# Patient Record
Sex: Female | Born: 1935 | Race: Black or African American | Hispanic: No | Smoking: Never smoker
Health system: Southern US, Community
[De-identification: ages and names within clinical notes are randomized; demographics above are authoritative.]

## PROBLEM LIST (undated history)

## (undated) DIAGNOSIS — K2100 Gastro-esophageal reflux disease with esophagitis, without bleeding: Secondary | ICD-10-CM

## (undated) DIAGNOSIS — B3731 Acute candidiasis of vulva and vagina: Secondary | ICD-10-CM

## (undated) DIAGNOSIS — I10 Essential (primary) hypertension: Secondary | ICD-10-CM

## (undated) DIAGNOSIS — N3090 Cystitis, unspecified without hematuria: Secondary | ICD-10-CM

## (undated) DIAGNOSIS — F329 Major depressive disorder, single episode, unspecified: Secondary | ICD-10-CM

## (undated) DIAGNOSIS — J189 Pneumonia, unspecified organism: Secondary | ICD-10-CM

## (undated) DIAGNOSIS — L0100 Impetigo, unspecified: Secondary | ICD-10-CM

## (undated) DIAGNOSIS — R519 Headache, unspecified: Secondary | ICD-10-CM

## (undated) DIAGNOSIS — M2560 Stiffness of unspecified joint, not elsewhere classified: Secondary | ICD-10-CM

## (undated) DIAGNOSIS — F0280 Dementia in other diseases classified elsewhere without behavioral disturbance: Secondary | ICD-10-CM

## (undated) DIAGNOSIS — R079 Chest pain, unspecified: Secondary | ICD-10-CM

## (undated) DIAGNOSIS — F411 Generalized anxiety disorder: Secondary | ICD-10-CM

## (undated) DIAGNOSIS — M1990 Unspecified osteoarthritis, unspecified site: Secondary | ICD-10-CM

## (undated) DIAGNOSIS — R269 Unspecified abnormalities of gait and mobility: Secondary | ICD-10-CM

## (undated) DIAGNOSIS — E785 Hyperlipidemia, unspecified: Secondary | ICD-10-CM

## (undated) DIAGNOSIS — F32A Depression, unspecified: Secondary | ICD-10-CM

## (undated) DIAGNOSIS — G309 Alzheimer's disease, unspecified: Secondary | ICD-10-CM

## (undated) DIAGNOSIS — R634 Abnormal weight loss: Secondary | ICD-10-CM

## (undated) DIAGNOSIS — R011 Cardiac murmur, unspecified: Secondary | ICD-10-CM

## (undated) DIAGNOSIS — K5900 Constipation, unspecified: Secondary | ICD-10-CM

## (undated) DIAGNOSIS — F03918 Unspecified dementia, unspecified severity, with other behavioral disturbance: Secondary | ICD-10-CM

## (undated) DIAGNOSIS — R569 Unspecified convulsions: Secondary | ICD-10-CM

## (undated) DIAGNOSIS — F0391 Unspecified dementia with behavioral disturbance: Secondary | ICD-10-CM

## (undated) DIAGNOSIS — E870 Hyperosmolality and hypernatremia: Secondary | ICD-10-CM

## (undated) DIAGNOSIS — B373 Candidiasis of vulva and vagina: Secondary | ICD-10-CM

## (undated) DIAGNOSIS — R413 Other amnesia: Secondary | ICD-10-CM

## (undated) DIAGNOSIS — K529 Noninfective gastroenteritis and colitis, unspecified: Secondary | ICD-10-CM

## (undated) DIAGNOSIS — K769 Liver disease, unspecified: Secondary | ICD-10-CM

## (undated) DIAGNOSIS — R51 Headache: Secondary | ICD-10-CM

## (undated) DIAGNOSIS — F4489 Other dissociative and conversion disorders: Secondary | ICD-10-CM

## (undated) DIAGNOSIS — L821 Other seborrheic keratosis: Secondary | ICD-10-CM

## (undated) DIAGNOSIS — I701 Atherosclerosis of renal artery: Secondary | ICD-10-CM

## (undated) HISTORY — DX: Unspecified abnormalities of gait and mobility: R26.9

## (undated) HISTORY — DX: Liver disease, unspecified: K76.9

## (undated) HISTORY — DX: Pneumonia, unspecified organism: J18.9

## (undated) HISTORY — DX: Cystitis, unspecified without hematuria: N30.90

## (undated) HISTORY — DX: Essential (primary) hypertension: I10

## (undated) HISTORY — DX: Other dissociative and conversion disorders: F44.89

## (undated) HISTORY — DX: Gastro-esophageal reflux disease with esophagitis, without bleeding: K21.00

## (undated) HISTORY — DX: Atherosclerosis of renal artery: I70.1

## (undated) HISTORY — DX: Hypercalcemia: E83.52

## (undated) HISTORY — DX: Candidiasis of vulva and vagina: B37.3

## (undated) HISTORY — DX: Acute candidiasis of vulva and vagina: B37.31

## (undated) HISTORY — DX: Constipation, unspecified: K59.00

## (undated) HISTORY — DX: Stiffness of unspecified joint, not elsewhere classified: M25.60

## (undated) HISTORY — DX: Noninfective gastroenteritis and colitis, unspecified: K52.9

## (undated) HISTORY — DX: Gastro-esophageal reflux disease with esophagitis: K21.0

## (undated) HISTORY — DX: Hyperosmolality and hypernatremia: E87.0

## (undated) HISTORY — DX: Other amnesia: R41.3

## (undated) HISTORY — DX: Unspecified osteoarthritis, unspecified site: M19.90

## (undated) HISTORY — DX: Chest pain, unspecified: R07.9

## (undated) HISTORY — DX: Unspecified dementia, unspecified severity, with other behavioral disturbance: F03.918

## (undated) HISTORY — DX: Hyperlipidemia, unspecified: E78.5

## (undated) HISTORY — DX: Dementia in other diseases classified elsewhere, unspecified severity, without behavioral disturbance, psychotic disturbance, mood disturbance, and anxiety: F02.80

## (undated) HISTORY — DX: Alzheimer's disease, unspecified: G30.9

## (undated) HISTORY — DX: Cardiac murmur, unspecified: R01.1

## (undated) HISTORY — DX: Abnormal weight loss: R63.4

## (undated) HISTORY — DX: Generalized anxiety disorder: F41.1

## (undated) HISTORY — DX: Impetigo, unspecified: L01.00

## (undated) HISTORY — DX: Headache, unspecified: R51.9

## (undated) HISTORY — DX: Unspecified dementia with behavioral disturbance: F03.91

## (undated) HISTORY — DX: Depression, unspecified: F32.A

## (undated) HISTORY — DX: Major depressive disorder, single episode, unspecified: F32.9

## (undated) HISTORY — DX: Other seborrheic keratosis: L82.1

## (undated) HISTORY — DX: Headache: R51

---

## 2002-10-08 ENCOUNTER — Other Ambulatory Visit: Admission: RE | Admit: 2002-10-08 | Discharge: 2002-10-08 | Payer: Self-pay | Admitting: Obstetrics and Gynecology

## 2003-10-12 ENCOUNTER — Other Ambulatory Visit: Admission: RE | Admit: 2003-10-12 | Discharge: 2003-10-12 | Payer: Self-pay | Admitting: Obstetrics and Gynecology

## 2004-11-03 ENCOUNTER — Other Ambulatory Visit: Admission: RE | Admit: 2004-11-03 | Discharge: 2004-11-03 | Payer: Self-pay | Admitting: Obstetrics and Gynecology

## 2005-11-15 ENCOUNTER — Other Ambulatory Visit: Admission: RE | Admit: 2005-11-15 | Discharge: 2005-11-15 | Payer: Self-pay | Admitting: Obstetrics and Gynecology

## 2007-07-24 DIAGNOSIS — R413 Other amnesia: Secondary | ICD-10-CM

## 2007-07-24 HISTORY — DX: Other amnesia: R41.3

## 2007-10-24 LAB — HM COLONOSCOPY: HM COLON: NORMAL

## 2008-08-19 DIAGNOSIS — R079 Chest pain, unspecified: Secondary | ICD-10-CM

## 2008-08-19 HISTORY — DX: Chest pain, unspecified: R07.9

## 2008-11-17 LAB — HM MAMMOGRAPHY: HM MAMMO: NEGATIVE

## 2009-09-13 ENCOUNTER — Encounter: Admission: RE | Admit: 2009-09-13 | Discharge: 2009-10-19 | Payer: Self-pay | Admitting: Internal Medicine

## 2010-11-24 LAB — HM DEXA SCAN

## 2011-04-07 ENCOUNTER — Other Ambulatory Visit: Payer: Self-pay | Admitting: Internal Medicine

## 2011-04-07 DIAGNOSIS — I10 Essential (primary) hypertension: Secondary | ICD-10-CM

## 2011-04-10 ENCOUNTER — Other Ambulatory Visit: Payer: Self-pay | Admitting: Internal Medicine

## 2011-04-10 ENCOUNTER — Inpatient Hospital Stay: Admission: RE | Admit: 2011-04-10 | Payer: Self-pay | Source: Ambulatory Visit

## 2011-04-10 DIAGNOSIS — I10 Essential (primary) hypertension: Secondary | ICD-10-CM

## 2011-04-13 ENCOUNTER — Ambulatory Visit
Admission: RE | Admit: 2011-04-13 | Discharge: 2011-04-13 | Disposition: A | Discharge status: Home / Self Care | Payer: Medicare Other | Source: Ambulatory Visit | Attending: Internal Medicine | Admitting: Internal Medicine

## 2011-04-13 ENCOUNTER — Other Ambulatory Visit: Payer: Self-pay

## 2011-04-13 DIAGNOSIS — I10 Essential (primary) hypertension: Secondary | ICD-10-CM

## 2011-04-14 ENCOUNTER — Other Ambulatory Visit: Payer: Self-pay

## 2011-11-02 DIAGNOSIS — J189 Pneumonia, unspecified organism: Secondary | ICD-10-CM

## 2011-11-02 DIAGNOSIS — K529 Noninfective gastroenteritis and colitis, unspecified: Secondary | ICD-10-CM

## 2011-11-02 HISTORY — DX: Noninfective gastroenteritis and colitis, unspecified: K52.9

## 2011-11-02 HISTORY — DX: Pneumonia, unspecified organism: J18.9

## 2013-02-26 ENCOUNTER — Other Ambulatory Visit: Payer: Self-pay | Admitting: *Deleted

## 2013-02-26 ENCOUNTER — Other Ambulatory Visit: Payer: Medicare Other

## 2013-02-26 DIAGNOSIS — I10 Essential (primary) hypertension: Secondary | ICD-10-CM

## 2013-02-26 DIAGNOSIS — E785 Hyperlipidemia, unspecified: Secondary | ICD-10-CM

## 2013-02-26 DIAGNOSIS — E870 Hyperosmolality and hypernatremia: Secondary | ICD-10-CM

## 2013-02-27 LAB — BASIC METABOLIC PANEL
BUN/Creatinine Ratio: 15 (ref 11–26)
BUN: 10 mg/dL (ref 8–27)
CO2: 24 mmol/L (ref 19–28)
Calcium: 10 mg/dL (ref 8.6–10.2)
Chloride: 103 mmol/L (ref 97–108)
Creatinine, Ser: 0.67 mg/dL (ref 0.57–1.00)
GFR calc Af Amer: 100 mL/min/{1.73_m2} (ref >59)
GFR calc non Af Amer: 87 mL/min/{1.73_m2} (ref >59)
Glucose: 86 mg/dL (ref 65–99)
Potassium: 4.5 mmol/L (ref 3.5–5.2)
Sodium: 142 mmol/L (ref 134–144)

## 2013-02-27 LAB — CBC WITH DIFFERENTIAL/PLATELET
Basophils Absolute: 0.1 10*3/uL (ref 0.0–0.2)
Basos: 1 % (ref 0–3)
Eos: 0 % (ref 0–5)
Eosinophils Absolute: 0 10*3/uL (ref 0.0–0.4)
HCT: 39.3 % (ref 34.0–46.6)
Hemoglobin: 12.6 g/dL (ref 11.1–15.9)
Immature Grans (Abs): 0 10*3/uL (ref 0.0–0.1)
Immature Granulocytes: 0 % (ref 0–2)
Lymphocytes Absolute: 2.6 10*3/uL (ref 0.7–3.1)
Lymphs: 48 % — ABNORMAL HIGH (ref 14–46)
MCH: 32 pg (ref 26.6–33.0)
MCHC: 32.1 g/dL (ref 31.5–35.7)
MCV: 100 fL — ABNORMAL HIGH (ref 79–97)
Monocytes Absolute: 0.4 10*3/uL (ref 0.1–0.9)
Monocytes: 7 % (ref 4–12)
Neutrophils Absolute: 2.4 10*3/uL (ref 1.4–7.0)
Neutrophils Relative %: 44 % (ref 40–74)
RBC: 3.94 x10E6/uL (ref 3.77–5.28)
RDW: 13.9 % (ref 12.3–15.4)
WBC: 5.5 10*3/uL (ref 3.4–10.8)

## 2013-03-19 ENCOUNTER — Encounter: Payer: Self-pay | Admitting: *Deleted

## 2013-03-20 ENCOUNTER — Ambulatory Visit (INDEPENDENT_AMBULATORY_CARE_PROVIDER_SITE_OTHER): Payer: Medicare Other | Admitting: Internal Medicine

## 2013-03-20 ENCOUNTER — Encounter: Payer: Self-pay | Admitting: Internal Medicine

## 2013-03-20 VITALS — BP 140/62 | HR 66 | Temp 95.3°F | Resp 18 | Wt 153.0 lb

## 2013-03-20 DIAGNOSIS — I701 Atherosclerosis of renal artery: Secondary | ICD-10-CM

## 2013-03-20 DIAGNOSIS — F0391 Unspecified dementia with behavioral disturbance: Secondary | ICD-10-CM | POA: Insufficient documentation

## 2013-03-20 DIAGNOSIS — G309 Alzheimer's disease, unspecified: Secondary | ICD-10-CM

## 2013-03-20 DIAGNOSIS — F0280 Dementia in other diseases classified elsewhere without behavioral disturbance: Secondary | ICD-10-CM

## 2013-03-20 DIAGNOSIS — I10 Essential (primary) hypertension: Secondary | ICD-10-CM

## 2013-03-20 DIAGNOSIS — F03918 Unspecified dementia, unspecified severity, with other behavioral disturbance: Secondary | ICD-10-CM | POA: Insufficient documentation

## 2013-03-20 DIAGNOSIS — K5900 Constipation, unspecified: Secondary | ICD-10-CM

## 2013-03-20 DIAGNOSIS — K219 Gastro-esophageal reflux disease without esophagitis: Secondary | ICD-10-CM

## 2013-03-20 DIAGNOSIS — E785 Hyperlipidemia, unspecified: Secondary | ICD-10-CM

## 2013-03-20 NOTE — Progress Notes (Signed)
Patient ID: Sabrina Long, female   DOB: 1935-10-30, 77 y.o.   MRN: [id number]  No Known Allergies  Chief Complaint  Patient presents with  . Medical Managment of Chronic Issues    Complains of speech getting worse    HPI: Patient is a 77 y.o. AA female seen in the office today for f/u of her chronic medical conditions including Alzheimer's disease with behavioral disturbance, constipation, and GERD.  Says sometimes she hurts--daughter says she does not c/o pain.    No longer on centrum--takes a vitamin drink.  Is taking her fosamax for her osteoporosis.    Husband is going to have a nuclear stress test instead of an exercise stress test.  Has to be asked everything now--does not remember to do anything on her own.  Mood has been better lately.  Mostly just sits.  Hides her diapers at times.  Husband keeps excusing the help or doing everything that the help is meant to do.  He won't let her sleep in.    BP is down to normal.  No chest pain.    Review of Systems:  Review of Systems  Constitutional: Negative for malaise/fatigue.  HENT: Negative for hearing loss.   Eyes: Negative for blurred vision.  Respiratory: Negative for shortness of breath.   Cardiovascular: Negative for chest pain and palpitations.  Gastrointestinal: Negative for abdominal pain.  Genitourinary: Negative for frequency.  Musculoskeletal: Negative for back pain, joint pain and falls.  Neurological: Negative for dizziness and weakness.  Endo/Heme/Allergies: Does not bruise/bleed easily.  Psychiatric/Behavioral: Positive for memory loss. Negative for depression. The patient is nervous/anxious. The patient does not have insomnia.     Past Medical History  Diagnosis Date  . Unspecified constipation   . Anxiety state, unspecified   . Colitis, enteritis, and gastroenteritis of presumed infectious origin 11/02/2011  . Pneumonia, organism unspecified 11/02/2011  . Cystitis, unspecified   . Impetigo   . Renal  artery stenosis   . Dementia in conditions classified elsewhere with behavioral disturbance(294.11)   . Hypercalcemia   . Reflux esophagitis   . Chest pain, unspecified   . Abnormality of gait   . Hyperosmolality and/or hypernatremia   . Unspecified disorder of liver   . Candidiasis of vulva and vagina   . Alzheimer's disease   . Chest pain, unspecified 08/19/2008  . Dementia in conditions classified elsewhere without behavioral disturbance(294.10)   . Depressive disorder, not elsewhere classified   . Esophageal reflux   . Other and unspecified hyperlipidemia   . Loss of weight   . Unspecified persistent mental disorders due to conditions classified elsewhere   . Reactive confusion   . Unspecified essential hypertension   . Other seborrheic keratosis   . Osteoarthrosis, unspecified whether generalized or localized, unspecified site   . Stiffness of joint, not elsewhere classified, unspecified site   . Memory loss 07/24/2007  . Headache(784.0)   . Undiagnosed cardiac murmurs    Past Surgical History  Procedure Laterality Date  . Cesarean section     Social History:   reports that she has never smoked. She does not have any smokeless tobacco history on file. Her alcohol and drug histories are not on file.  No family history on file.  Medications: Patient's Medications  New Prescriptions   No medications on file  Previous Medications   ALPRAZOLAM (XANAX) 0.5 MG TABLET    Take 0.5 mg by mouth at bedtime as needed for sleep. Take one tablet 3 times a  day for anxiety   ASCORBIC ACID (VITAMIN C) 500 MG TABLET    Take 500 mg by mouth daily. Take one tablet once a day for vitamin c supplement   ASPIRIN 81 MG TABLET    Take 81 mg by mouth daily. Take one tablet once a day to help with heart   HYDRALAZINE (APRESOLINE) 50 MG TABLET    Take 50 mg by mouth 3 (three) times daily. Take one tablet four times a day for blood pressure   LOSARTAN (COZAAR) 25 MG TABLET    Take 25 mg by mouth  daily. Take one tablet once a day for blood pressure   MEMANTINE (NAMENDA) 10 MG TABLET    Take 10 mg by mouth 2 (two) times daily. Take one tablet twice a day for memory loss   MULTIPLE VITAMINS-MINERALS (CENTRUM SILVER PO)    Take by mouth. Take one tablet once a day   MULTIPLE VITAMINS-MINERALS (OCUVITE PO)    Take by mouth. Take one tablet once a day to help preserve vision   NITROGLYCERIN (NITROSTAT) 0.4 MG SL TABLET    Place 0.4 mg under the tongue every 5 (five) minutes as needed for chest pain. Take one tablet sublingual every 5 minutes not to exceed three tablets for chest pain   OMEPRAZOLE (PRILOSEC) 40 MG CAPSULE    Take 40 mg by mouth daily. Take one tablet once a day for acid reflux   ROSUVASTATIN (CRESTOR) 10 MG TABLET    Take 10 mg by mouth daily. Take one tablet once a day for cholesterol   SERTRALINE (ZOLOFT) 100 MG TABLET    Take 100 mg by mouth daily. Take one tablet once a day   VITAMIN E (VITAMIN E) 400 UNIT CAPSULE    Take 400 Units by mouth daily. Take one tablet once a day for vitamin e supplement  Modified Medications   No medications on file  Discontinued Medications   No medications on file    Physical Exam: Filed Vitals:   03/20/13 1022  BP: 140/62  Pulse: 66  Temp: 95.3 F (35.2 C)  TempSrc: Oral  Resp: 18  Weight: 153 lb (69.4 kg)  Physical Exam  Constitutional: No distress.  Frail AA female NAD, minimal communication  HENT:  Head: Normocephalic and atraumatic.  Neck: No JVD present.  Cardiovascular: Normal rate, regular rhythm, normal heart sounds and intact distal pulses.   Pulmonary/Chest: Effort normal and breath sounds normal.  Abdominal: Soft. Bowel sounds are normal. She exhibits no distension. There is no tenderness.  Musculoskeletal: Normal range of motion. She exhibits no edema and no tenderness.  Neurological: She is alert.  Oriented to person only  Skin: She is not diaphoretic.  Psychiatric: Thought content is delusional. Cognition and  memory are impaired. She expresses inappropriate judgment. She exhibits abnormal recent memory and abnormal remote memory.   Labs reviewed: Basic Metabolic Panel:  Recent Labs  16/10/96 1202  NA 142  K 4.5  CL 103  CO2 24  GLUCOSE 86  BUN 10  CREATININE 0.67  CALCIUM 10.0   CBC:  Recent Labs  02/26/13 1202  WBC 5.5  NEUTROABS 2.4  HGB 12.6  HCT 39.3  MCV 100*   Assessment/Plan Unspecified constipation Recently stable.    Renal artery stenosis BP reasonably well controlled lately.  Avoid ace and arb.  Husband gives meds.  Alzheimer's disease Declining progressively despite control of vascular risks.  She is dependent in ADLs except transfers and eating.  She  only speaks minimally and most of it does not make sense.  Her husband tends to refuse the help of home health services and aides, but has very high caregiver burden.  Fortunately, from a behavioral standpoint, she's been stable.  I encouraged adult day health care or senior center for both of them.  Esophageal reflux No related complaints today.  Abdominal concerns come and go.  Hyperlipidemia LDL goal < 100 Follow up lipids today.  Cont crestor.   Labs/tests ordered"  Cbc, bmp, flp

## 2013-03-20 NOTE — Patient Instructions (Addendum)
I recommend adult day health care for both you and your husband. I checked your cholesterol today.

## 2013-03-21 LAB — LIPID PANEL
Chol/HDL Ratio: 2.6 ratio units (ref 0.0–4.4)
Cholesterol, Total: 179 mg/dL (ref 100–199)
HDL: 70 mg/dL (ref >39)
LDL Calculated: 97 mg/dL (ref 0–99)
Triglycerides: 62 mg/dL (ref 0–149)
VLDL Cholesterol Cal: 12 mg/dL (ref 5–40)

## 2013-03-22 NOTE — Assessment & Plan Note (Signed)
BP reasonably well controlled lately.  Avoid ace and arb.  Husband gives meds.

## 2013-03-22 NOTE — Assessment & Plan Note (Signed)
No related complaints today.  Abdominal concerns come and go.

## 2013-03-22 NOTE — Assessment & Plan Note (Signed)
Follow up lipids today.  Cont crestor.

## 2013-03-22 NOTE — Assessment & Plan Note (Signed)
Declining progressively despite control of vascular risks.  She is dependent in ADLs except transfers and eating.  She only speaks minimally and most of it does not make sense.  Her husband tends to refuse the help of home health services and aides, but has very high caregiver burden.  Fortunately, from a behavioral standpoint, she's been stable.  I encouraged adult day health care or senior center for both of them.

## 2013-03-22 NOTE — Assessment & Plan Note (Signed)
Recently stable.

## 2013-03-26 ENCOUNTER — Other Ambulatory Visit: Payer: Self-pay | Admitting: Internal Medicine

## 2013-04-09 ENCOUNTER — Other Ambulatory Visit: Payer: Self-pay | Admitting: Internal Medicine

## 2013-05-01 ENCOUNTER — Emergency Department (HOSPITAL_COMMUNITY): Payer: Medicare Other

## 2013-05-01 ENCOUNTER — Inpatient Hospital Stay (HOSPITAL_COMMUNITY)
Admission: EM | Admit: 2013-05-01 | Discharge: 2013-05-05 | DRG: 871 | Disposition: A | Discharge status: Home Health | Payer: Medicare Other | Attending: Family Medicine | Admitting: Family Medicine

## 2013-05-01 ENCOUNTER — Encounter (HOSPITAL_COMMUNITY): Payer: Self-pay | Admitting: Cardiology

## 2013-05-01 DIAGNOSIS — E785 Hyperlipidemia, unspecified: Secondary | ICD-10-CM | POA: Diagnosis present

## 2013-05-01 DIAGNOSIS — I509 Heart failure, unspecified: Secondary | ICD-10-CM | POA: Diagnosis present

## 2013-05-01 DIAGNOSIS — F0280 Dementia in other diseases classified elsewhere without behavioral disturbance: Secondary | ICD-10-CM | POA: Diagnosis present

## 2013-05-01 DIAGNOSIS — M1990 Unspecified osteoarthritis, unspecified site: Secondary | ICD-10-CM | POA: Diagnosis present

## 2013-05-01 DIAGNOSIS — I498 Other specified cardiac arrhythmias: Secondary | ICD-10-CM | POA: Diagnosis present

## 2013-05-01 DIAGNOSIS — F0390 Unspecified dementia without behavioral disturbance: Secondary | ICD-10-CM | POA: Diagnosis present

## 2013-05-01 DIAGNOSIS — F411 Generalized anxiety disorder: Secondary | ICD-10-CM | POA: Diagnosis present

## 2013-05-01 DIAGNOSIS — N710 Acute inflammatory disease of uterus: Secondary | ICD-10-CM | POA: Diagnosis present

## 2013-05-01 DIAGNOSIS — F329 Major depressive disorder, single episode, unspecified: Secondary | ICD-10-CM | POA: Diagnosis present

## 2013-05-01 DIAGNOSIS — R4182 Altered mental status, unspecified: Secondary | ICD-10-CM

## 2013-05-01 DIAGNOSIS — Z22322 Carrier or suspected carrier of Methicillin resistant Staphylococcus aureus: Secondary | ICD-10-CM

## 2013-05-01 DIAGNOSIS — A481 Legionnaires' disease: Secondary | ICD-10-CM

## 2013-05-01 DIAGNOSIS — A419 Sepsis, unspecified organism: Principal | ICD-10-CM | POA: Diagnosis present

## 2013-05-01 DIAGNOSIS — J189 Pneumonia, unspecified organism: Secondary | ICD-10-CM

## 2013-05-01 DIAGNOSIS — F3289 Other specified depressive episodes: Secondary | ICD-10-CM | POA: Diagnosis present

## 2013-05-01 DIAGNOSIS — G309 Alzheimer's disease, unspecified: Secondary | ICD-10-CM | POA: Diagnosis present

## 2013-05-01 LAB — CBC WITH DIFFERENTIAL/PLATELET
Basophils Absolute: 0 10*3/uL (ref 0.0–0.1)
Eosinophils Absolute: 0 10*3/uL (ref 0.0–0.7)
Eosinophils Relative: 0 % (ref 0–5)
Lymphs Abs: 1.8 10*3/uL (ref 0.7–4.0)
MCH: 32.3 pg (ref 26.0–34.0)
MCV: 95.1 fL (ref 78.0–100.0)
Neutrophils Relative %: 84 % — ABNORMAL HIGH (ref 43–77)
Platelets: 192 10*3/uL (ref 150–400)
RBC: 3.71 MIL/uL — ABNORMAL LOW (ref 3.87–5.11)
RDW: 14.3 % (ref 11.5–15.5)
WBC: 20.7 10*3/uL — ABNORMAL HIGH (ref 4.0–10.5)

## 2013-05-01 LAB — URINALYSIS, ROUTINE W REFLEX MICROSCOPIC
Glucose, UA: 100 mg/dL — AB
Hgb urine dipstick: NEGATIVE
Specific Gravity, Urine: 1.026 (ref 1.005–1.030)
Urobilinogen, UA: 1 mg/dL (ref 0.0–1.0)
pH: 5.5 (ref 5.0–8.0)

## 2013-05-01 LAB — URINE MICROSCOPIC-ADD ON

## 2013-05-01 LAB — COMPREHENSIVE METABOLIC PANEL
ALT: 52 U/L — ABNORMAL HIGH (ref 0–35)
AST: 53 U/L — ABNORMAL HIGH (ref 0–37)
Albumin: 3.1 g/dL — ABNORMAL LOW (ref 3.5–5.2)
Alkaline Phosphatase: 80 U/L (ref 39–117)
Calcium: 9.7 mg/dL (ref 8.4–10.5)
Potassium: 3.4 mEq/L — ABNORMAL LOW (ref 3.5–5.1)
Sodium: 139 mEq/L (ref 135–145)
Total Protein: 8.1 g/dL (ref 6.0–8.3)

## 2013-05-01 LAB — POCT I-STAT 3, ART BLOOD GAS (G3+)
Acid-base deficit: 1 mmol/L (ref 0.0–2.0)
O2 Saturation: 89 %
pO2, Arterial: 53 mmHg — ABNORMAL LOW (ref 80.0–100.0)

## 2013-05-01 MED ORDER — ACETAMINOPHEN 650 MG RE SUPP
650.0000 mg | RECTAL | Status: DC | PRN
  Administered 2013-05-01: 650 mg via RECTAL
  Filled 2013-05-01: qty 1

## 2013-05-01 MED ORDER — HYDRALAZINE HCL 50 MG PO TABS
50.0000 mg | ORAL_TABLET | Freq: Three times a day (TID) | ORAL | Status: DC
  Administered 2013-05-02 – 2013-05-05 (×10): 50 mg via ORAL
  Filled 2013-05-01 (×14): qty 1

## 2013-05-01 MED ORDER — LORAZEPAM 2 MG/ML IJ SOLN
0.2500 mg | Freq: Once | INTRAMUSCULAR | Status: AC
  Administered 2013-05-01: 0.25 mg via INTRAVENOUS
  Filled 2013-05-01: qty 1

## 2013-05-01 MED ORDER — PANTOPRAZOLE SODIUM 40 MG PO TBEC
40.0000 mg | DELAYED_RELEASE_TABLET | Freq: Every day | ORAL | Status: DC
  Administered 2013-05-02 – 2013-05-05 (×4): 40 mg via ORAL
  Filled 2013-05-01 (×4): qty 1

## 2013-05-01 MED ORDER — CEFTRIAXONE SODIUM 1 G IJ SOLR
1.0000 g | INTRAMUSCULAR | Status: DC
  Filled 2013-05-01 (×2): qty 10

## 2013-05-01 MED ORDER — LORAZEPAM 2 MG/ML IJ SOLN
0.5000 mg | Freq: Once | INTRAMUSCULAR | Status: AC
  Administered 2013-05-01: 0.5 mg via INTRAVENOUS
  Filled 2013-05-01: qty 1

## 2013-05-01 MED ORDER — DEXTROSE 5 % IV SOLN
500.0000 mg | INTRAVENOUS | Status: DC
  Filled 2013-05-01 (×2): qty 500

## 2013-05-01 MED ORDER — LOSARTAN POTASSIUM 25 MG PO TABS
25.0000 mg | ORAL_TABLET | Freq: Every day | ORAL | Status: DC
  Administered 2013-05-02 – 2013-05-05 (×4): 25 mg via ORAL
  Filled 2013-05-01 (×4): qty 1

## 2013-05-01 MED ORDER — IOHEXOL 300 MG/ML  SOLN
50.0000 mL | Freq: Once | INTRAMUSCULAR | Status: AC | PRN
  Administered 2013-05-01: 50 mL via ORAL

## 2013-05-01 MED ORDER — HEPARIN SODIUM (PORCINE) 5000 UNIT/ML IJ SOLN
5000.0000 [IU] | Freq: Three times a day (TID) | INTRAMUSCULAR | Status: DC
  Administered 2013-05-02 – 2013-05-05 (×11): 5000 [IU] via SUBCUTANEOUS
  Filled 2013-05-01 (×13): qty 1

## 2013-05-01 MED ORDER — IOHEXOL 300 MG/ML  SOLN
100.0000 mL | Freq: Once | INTRAMUSCULAR | Status: AC | PRN
  Administered 2013-05-01: 100 mL via INTRAVENOUS

## 2013-05-01 MED ORDER — CEFTRIAXONE SODIUM 1 G IJ SOLR
1.0000 g | Freq: Once | INTRAMUSCULAR | Status: AC
  Administered 2013-05-01: 1 g via INTRAVENOUS
  Filled 2013-05-01: qty 10

## 2013-05-01 MED ORDER — ATORVASTATIN CALCIUM 10 MG PO TABS
10.0000 mg | ORAL_TABLET | Freq: Every day | ORAL | Status: DC
  Administered 2013-05-02 – 2013-05-05 (×4): 10 mg via ORAL
  Filled 2013-05-01 (×4): qty 1

## 2013-05-01 MED ORDER — ALPRAZOLAM 0.5 MG PO TABS: 0.5000 mg | ORAL_TABLET | Freq: Every evening | ORAL | Status: DC | PRN

## 2013-05-01 MED ORDER — METRONIDAZOLE IN NACL 5-0.79 MG/ML-% IV SOLN
500.0000 mg | Freq: Three times a day (TID) | INTRAVENOUS | Status: DC
  Administered 2013-05-02: 500 mg via INTRAVENOUS
  Filled 2013-05-01 (×4): qty 100

## 2013-05-01 MED ORDER — DEXTROSE 5 % IV SOLN
500.0000 mg | Freq: Once | INTRAVENOUS | Status: AC
  Administered 2013-05-01: 23:00:00 via INTRAVENOUS
  Filled 2013-05-01: qty 500

## 2013-05-01 MED ORDER — MEMANTINE HCL 10 MG PO TABS
10.0000 mg | ORAL_TABLET | Freq: Two times a day (BID) | ORAL | Status: DC
  Administered 2013-05-02 – 2013-05-05 (×7): 10 mg via ORAL
  Filled 2013-05-01 (×9): qty 1

## 2013-05-01 MED ORDER — SERTRALINE HCL 100 MG PO TABS
100.0000 mg | ORAL_TABLET | Freq: Every day | ORAL | Status: DC
  Administered 2013-05-02 – 2013-05-05 (×4): 100 mg via ORAL
  Filled 2013-05-01 (×4): qty 1

## 2013-05-01 NOTE — ED Provider Notes (Signed)
History    CSN: [id number] Arrival date & time 05/01/13  1637  First MD Initiated Contact with Patient 05/01/13 1657     Chief Complaint  Patient presents with  . Shortness of Breath  . Fever  level 5 caveat due to dementia (Consider location/radiation/quality/duration/timing/severity/associated sxs/prior Treatment) Patient is a 77 y.o. female presenting with shortness of breath and fever. The history is provided by the patient and a relative.  Shortness of Breath Associated symptoms: fever   Fever  patient has had some bending over possibly and pain. She was seen at an urgent care up in [place]  and was diagnosed with bilateral pneumonia. She reportedly had a pulse ox is 90%. family did not want her admitted up there and they came down here for admission. She did not come with x-ray or lab work. She is baseline demented. Past Medical History  Diagnosis Date  . Unspecified constipation   . Anxiety state, unspecified   . Colitis, enteritis, and gastroenteritis of presumed infectious origin 11/02/2011  . Pneumonia, organism unspecified 11/02/2011  . Cystitis, unspecified   . Impetigo   . Renal artery stenosis   . Dementia in conditions classified elsewhere with behavioral disturbance(294.11)   . Hypercalcemia   . Reflux esophagitis   . Chest pain, unspecified   . Abnormality of gait   . Hyperosmolality and/or hypernatremia   . Unspecified disorder of liver   . Candidiasis of vulva and vagina   . Alzheimer's disease   . Chest pain, unspecified 08/19/2008  . Dementia in conditions classified elsewhere without behavioral disturbance(294.10)   . Depressive disorder, not elsewhere classified   . Esophageal reflux   . Other and unspecified hyperlipidemia   . Loss of weight   . Unspecified persistent mental disorders due to conditions classified elsewhere   . Reactive confusion   . Unspecified essential hypertension   . Other seborrheic keratosis   . Osteoarthrosis,  unspecified whether generalized or localized, unspecified site   . Stiffness of joint, not elsewhere classified, unspecified site   . Memory loss 07/24/2007  . Headache(784.0)   . Undiagnosed cardiac murmurs    Past Surgical History  Procedure Laterality Date  . Cesarean section     History reviewed. No pertinent family history. History  Substance Use Topics  . Smoking status: Never Smoker   . Smokeless tobacco: Not on file  . Alcohol Use: Not on file   OB History   Grav Para Term Preterm Abortions TAB SAB Ect Mult Living                 Review of Systems  Unable to perform ROS: Dementia  Constitutional: Positive for fever.  Respiratory: Positive for shortness of breath.     Allergies  Review of patient's allergies indicates no known allergies.  Home Medications   Current Outpatient Rx  Name  Route  Sig  Dispense  Refill  . alendronate (FOSAMAX) 70 MG tablet   Oral   Take 70 mg by mouth every 7 (seven) days. Take with a full glass of water on an empty stomach.         . ALPRAZolam (XANAX) 0.5 MG tablet   Oral   Take 0.5 mg by mouth at bedtime as needed for sleep. Take one tablet 3 times a day for anxiety         . hydrALAZINE (APRESOLINE) 50 MG tablet   Oral   Take 50 mg by mouth 3 (three) times daily.          Marland Kitchen  losartan (COZAAR) 25 MG tablet   Oral   Take 25 mg by mouth daily. Take one tablet once a day for blood pressure         . memantine (NAMENDA) 10 MG tablet   Oral   Take 10 mg by mouth 2 (two) times daily. Take one tablet twice a day for memory loss         . Multiple Vitamins-Minerals (CENTRUM SILVER PO)   Oral   Take by mouth. Take one tablet once a day         . nitroGLYCERIN (NITROSTAT) 0.4 MG SL tablet   Sublingual   Place 0.4 mg under the tongue every 5 (five) minutes as needed for chest pain. Take one tablet sublingual every 5 minutes not to exceed three tablets for chest pain         . omeprazole (PRILOSEC) 20 MG capsule       TAKE 1 CAPSULE ONCE DAILY TO REDUCE STOMACH ACID AND TO HELP PROTECT ESOPHAGUS   84 capsule   2   . rosuvastatin (CRESTOR) 10 MG tablet               . sertraline (ZOLOFT) 100 MG tablet   Oral   Take 100 mg by mouth daily. Take one tablet once a day          BP 146/72  Pulse 111  Temp(Src) 101.5 F (38.6 C) (Oral)  Resp 31  SpO2 99% Physical Exam  Constitutional: She appears well-developed and well-nourished.  HENT:  Head: Normocephalic.  Eyes: Pupils are equal, round, and reactive to light.  Cardiovascular:  Mild tachycardia  Pulmonary/Chest: Effort normal.  Mildly harsh breath sounds mild tachypnea  Abdominal: There is tenderness.  Mild diffuse tenderness.  Musculoskeletal: She exhibits no edema.  Neurological: She is alert.  Patient has baseline dementia  Skin: Skin is warm.    ED Course  Procedures (including critical care time) Labs Reviewed  CBC WITH DIFFERENTIAL - Abnormal; Notable for the following:    WBC 20.7 (*)    RBC 3.71 (*)    HCT 35.3 (*)    Neutrophils Relative % 84 (*)    Neutro Abs 17.4 (*)    Lymphocytes Relative 9 (*)    Monocytes Absolute 1.5 (*)    All other components within normal limits  COMPREHENSIVE METABOLIC PANEL - Abnormal; Notable for the following:    Potassium 3.4 (*)    Glucose, Bld 134 (*)    Albumin 3.1 (*)    AST 53 (*)    ALT 52 (*)    GFR calc non Af Amer 62 (*)    GFR calc Af Amer 72 (*)    All other components within normal limits  URINALYSIS, ROUTINE W REFLEX MICROSCOPIC - Abnormal; Notable for the following:    Color, Urine AMBER (*)    APPearance HAZY (*)    Glucose, UA 100 (*)    Bilirubin Urine SMALL (*)    Ketones, ur 15 (*)    Protein, ur 100 (*)    Leukocytes, UA SMALL (*)    All other components within normal limits  URINE MICROSCOPIC-ADD ON - Abnormal; Notable for the following:    Bacteria, UA FEW (*)    Casts HYALINE CASTS (*)    All other components within normal limits  POCT I-STAT 3,  BLOOD GAS (G3+) - Abnormal; Notable for the following:    pCO2 arterial 34.8 (*)    pO2, Arterial 53.0 (*)  All other components within normal limits  CULTURE, BLOOD (ROUTINE X 2)  CULTURE, BLOOD (ROUTINE X 2)  CULTURE, EXPECTORATED SPUTUM-ASSESSMENT  GRAM STAIN  LIPASE, BLOOD  HIV ANTIBODY (ROUTINE TESTING)  LEGIONELLA ANTIGEN, URINE  STREP PNEUMONIAE URINARY ANTIGEN  CBC  BASIC METABOLIC PANEL  CG4 I-STAT (LACTIC ACID)   Dg Chest 2 View  05/01/2013   *RADIOLOGY REPORT*  Clinical Data: Shortness of breath.  CHEST - 2 VIEW  Comparison: None.  Findings: Mild cardiomegaly with vascular congestion.  Diffuse interstitial prominence may reflect interstitial edema.  Bibasilar atelectasis.  No effusions.  No acute bony abnormality.  IMPRESSION: Suspect mild edema/CHF.  Bibasilar atelectasis.   Original Report Authenticated By: Charlett Nose, M.D.   Ct Abdomen Pelvis W Contrast  05/01/2013   *RADIOLOGY REPORT*  Clinical Data: Shortness of breath, fever.  CT ABDOMEN AND PELVIS WITH CONTRAST  Technique:  Multidetector CT imaging of the abdomen and pelvis was performed following the standard protocol during bolus administration of intravenous contrast.  Contrast: OMNIPAQUE IOHEXOL 300 MG/ML  SOLN  Comparison: None.  Findings: Somewhat nodular appearing airspace opacities are noted in the visualized lower lung fields bilaterally.  I am concerned this likely represents pneumonia.  No effusions.  Heart is borderline in size.  Liver, gallbladder, spleen, pancreas, adrenals and right kidney are unremarkable.  Small cysts in the left kidney.  No hydronephrosis.  Moderate stool throughout the colon.  Small bowel is decompressed.  There is air centrally within the uterus, likely within the endometrial cavity.  If there has not been recent instrumentation, I cannot exclude infection/endometritis.  Calcified fibroids throughout the uterus.  No adnexal masses.  Urinary bladder is unremarkable.  Aorta is normal  caliber.  IMPRESSION: Patchy bilateral somewhat nodular appearing opacities in the lower lungs concerning for pneumonia.  Small amount of gas centrally within the uterus, presumably within the endometrium.  If the patient has not had a recent instrumentation, this could represent infection/endometritis. Recommend clinical correlation.   Original Report Authenticated By: Charlett Nose, M.D.   1. CAP (community acquired pneumonia)   2. Acute endometritis   3. Altered mental status   4. Sepsis     MDM  Patient with bilateral pneumonia found on CT. X-ray of did not show the pneumonia here, however the outpatient x-ray was reported to show it. White count is elevated. She has a fever. She initially was not in respiratory distress but her respiratory rate later increased. CT scan shows air within the uterus. No recent instrumentation. Discussed with GYN. Stated it may need to get followed. Will admit to internal medicine.  Juliet Rude. Rubin Payor, MD 05/02/13 0981

## 2013-05-01 NOTE — ED Notes (Signed)
Pt family reports that she has not been feeling well over the past couple of days. Family reports that pt had a fever today and was taken to [facility] in [place]. X-ray reports double PNA per family. Pt with hx of alzheimers. No distress noted. Pt denies any pain at this time.

## 2013-05-01 NOTE — Progress Notes (Signed)
Patient started on 4L [place] due to PO2 on ABG result.  RN aware.

## 2013-05-02 LAB — STREP PNEUMONIAE URINARY ANTIGEN: Strep Pneumo Urinary Antigen: NEGATIVE

## 2013-05-02 LAB — LEGIONELLA ANTIGEN, URINE: Legionella Antigen, Urine: POSITIVE

## 2013-05-02 LAB — CBC
Platelets: 188 10*3/uL (ref 150–400)
RDW: 14.6 % (ref 11.5–15.5)
WBC: 19.9 10*3/uL — ABNORMAL HIGH (ref 4.0–10.5)

## 2013-05-02 LAB — BASIC METABOLIC PANEL
Chloride: 103 mEq/L (ref 96–112)
GFR calc Af Amer: >90 mL/min (ref >90)
Potassium: 4.9 mEq/L (ref 3.5–5.1)
Sodium: 137 mEq/L (ref 135–145)

## 2013-05-02 LAB — MRSA PCR SCREENING: MRSA by PCR: POSITIVE — AB

## 2013-05-02 MED ORDER — ACETAMINOPHEN 500 MG PO TABS
500.0000 mg | ORAL_TABLET | Freq: Four times a day (QID) | ORAL | Status: DC | PRN
  Administered 2013-05-02 – 2013-05-03 (×3): 500 mg via ORAL
  Filled 2013-05-02 (×3): qty 1

## 2013-05-02 MED ORDER — ENSURE COMPLETE PO LIQD
237.0000 mL | Freq: Two times a day (BID) | ORAL | Status: DC
  Administered 2013-05-02 – 2013-05-05 (×7): 237 mL via ORAL

## 2013-05-02 MED ORDER — CHLORHEXIDINE GLUCONATE CLOTH 2 % EX PADS
6.0000 | MEDICATED_PAD | Freq: Every day | CUTANEOUS | Status: DC
  Administered 2013-05-02 – 2013-05-05 (×4): 6 via TOPICAL

## 2013-05-02 MED ORDER — SODIUM CHLORIDE 0.9 % IV SOLN
INTRAVENOUS | Status: DC
  Administered 2013-05-02 – 2013-05-04 (×5): via INTRAVENOUS

## 2013-05-02 MED ORDER — LEVOFLOXACIN IN D5W 750 MG/150ML IV SOLN
750.0000 mg | INTRAVENOUS | Status: DC
  Administered 2013-05-02 – 2013-05-04 (×3): 750 mg via INTRAVENOUS
  Filled 2013-05-02 (×4): qty 150

## 2013-05-02 MED ORDER — ALPRAZOLAM 0.5 MG PO TABS
0.5000 mg | ORAL_TABLET | Freq: Three times a day (TID) | ORAL | Status: DC | PRN
  Administered 2013-05-02 – 2013-05-04 (×5): 0.5 mg via ORAL
  Filled 2013-05-02 (×5): qty 1

## 2013-05-02 MED ORDER — METOPROLOL TARTRATE 12.5 MG HALF TABLET
12.5000 mg | ORAL_TABLET | Freq: Two times a day (BID) | ORAL | Status: DC
  Administered 2013-05-02 – 2013-05-05 (×7): 12.5 mg via ORAL
  Filled 2013-05-02 (×8): qty 1

## 2013-05-02 MED ORDER — MUPIROCIN 2 % EX OINT
1.0000 "application " | TOPICAL_OINTMENT | Freq: Two times a day (BID) | CUTANEOUS | Status: DC
  Administered 2013-05-02 – 2013-05-05 (×7): 1 via NASAL
  Filled 2013-05-02 (×3): qty 22

## 2013-05-02 NOTE — Progress Notes (Signed)
Utilization Review Completed.  05/02/2013

## 2013-05-02 NOTE — Progress Notes (Addendum)
INITIAL NUTRITION ASSESSMENT  DOCUMENTATION CODES Per approved criteria  -Not Applicable   INTERVENTION:  Ensure Complete twice daily (350 kcals, 13 gm protein per 8 fl oz bottle) RD to follow for nutrition care plan  NUTRITION DIAGNOSIS: Increased nutrient needs related to acute illness, skin integrity as evidenced by estimated nutrition needs  Goal: Oral intake with meals & supplements to meet >/= 90% of estimated nutrition needs  Monitor:  PO & supplemental intake, weight, labs, I/O's  Reason for Assessment: Low Braden  77 y.o. female  Admitting Dx: Sepsis  ASSESSMENT: Patient brought in by family for illness over the past couple of days; patient had been febrile and was hunching over grabbing her abdomen at times, so family took her to [facility] in [place]; CXR showed PNA; transferred to [facility] for further evaluation ---> found to have B PNA and possible endometritis.  RD unable to obtain nutrition hx from patient given AMS; no family at bedside; no % PO intake recorded in flowsheet records; height estimated per RD as not available in EMR; patient at risk for skin breakdown given low braden score; would benefit from nutrition supplements ---> RD to order.  Height: ~ 5 '5 (165 cm)  Weight: Wt Readings from Last 1 Encounters:  03/20/13 153 lb (69.4 kg)    Ideal Body Weight: 125 lb  % Ideal Body Weight: 122%  Wt Readings from Last 10 Encounters:  03/20/13 153 lb (69.4 kg)    Usual Body Weight: unable to obtain  % Usual Body Weight: ---  BMI:  25.3 kg/m2  Estimated Nutritional Needs: Kcal: 1700-1900 Protein: 80-90 gm Fluid: 1.7-1.9 L  Skin: Intact  Diet Order: General  EDUCATION NEEDS: -No education needs identified at this time   Intake/Output Summary (Last 24 hours) at 05/02/13 1419 Last data filed at 05/02/13 0600  Gross per 24 hour  Intake      0 ml  Output    250 ml  Net   -250 ml    Labs:   Recent Labs Lab 05/01/13 1827 05/02/13 1045   NA 139 137  K 3.4* 4.9  CL 105 103  CO2 24 23  BUN 14 11  CREATININE 0.89 0.74  CALCIUM 9.7 9.8  GLUCOSE 134* 155*    Scheduled Meds: . atorvastatin  10 mg Oral q1800  . Chlorhexidine Gluconate Cloth  6 each Topical Q0600  . heparin  5,000 Units Subcutaneous Q8H  . hydrALAZINE  50 mg Oral Q8H  . levofloxacin (LEVAQUIN) IV  750 mg Intravenous Q24H  . losartan  25 mg Oral Daily  . memantine  10 mg Oral BID  . metoprolol tartrate  12.5 mg Oral BID  . mupirocin ointment  1 application Nasal BID  . pantoprazole  40 mg Oral Daily  . sertraline  100 mg Oral Daily    Continuous Infusions: . sodium chloride 50 mL/hr at 05/02/13 1036    Past Medical History  Diagnosis Date  . Unspecified constipation   . Anxiety state, unspecified   . Colitis, enteritis, and gastroenteritis of presumed infectious origin 11/02/2011  . Pneumonia, organism unspecified 11/02/2011  . Cystitis, unspecified   . Impetigo   . Renal artery stenosis   . Dementia in conditions classified elsewhere with behavioral disturbance(294.11)   . Hypercalcemia   . Reflux esophagitis   . Chest pain, unspecified   . Abnormality of gait   . Hyperosmolality and/or hypernatremia   . Unspecified disorder of liver   . Candidiasis of vulva and  vagina   . Alzheimer's disease   . Chest pain, unspecified 08/19/2008  . Dementia in conditions classified elsewhere without behavioral disturbance(294.10)   . Depressive disorder, not elsewhere classified   . Esophageal reflux   . Other and unspecified hyperlipidemia   . Loss of weight   . Unspecified persistent mental disorders due to conditions classified elsewhere   . Reactive confusion   . Unspecified essential hypertension   . Other seborrheic keratosis   . Osteoarthrosis, unspecified whether generalized or localized, unspecified site   . Stiffness of joint, not elsewhere classified, unspecified site   . Memory loss 07/24/2007  . Headache(784.0)   . Undiagnosed  cardiac murmurs     Past Surgical History  Procedure Laterality Date  . Cesarean section      Sabrina Long, RD, LDN Pager #: [phone] After-Hours Pager #: [phone]

## 2013-05-02 NOTE — Progress Notes (Signed)
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 Mahala Menghini  Time of first page:  1350  MD notified (2nd page):  Time of second page:  Responding MD:  Dr. Mahala Menghini  Time MD responded:  [phone]

## 2013-05-02 NOTE — H&P (Signed)
[facility] History and Physical  Sabrina Long:[id number] DOB: December 02, 1935 DOA: 05/01/2013  Referring physician: ED PCP: Bufford Spikes, DO   Chief Complaint: PNA, abd pain  HPI: Sabrina Long is a 77 y.o. female who is brought in by family for illness over the past couple of days.  She has apparently been febrile and hunching over grabbing her abdomen at times so family took her to [facility] in [place].  CXR showed PNA.  [facility] was brought [facility] for [facility].  In [place] patient was found to have B PNA and possible endometritis on CT ABD/Pelvis, she was frankly septic with 4 out of 4 SIRS criteria at the time of my evaluation.  Patient is unfortunately demented with alzheimers and cant provide much history especially after receiving ativan in ED for agitation (now she is obtunded).  ABG did show slight hypoxia so she was started on [place] but does not appear to be in respiratory failure at this time so she was not intubated.  IM is admitting her to the SDU for further care and close follow up.  Review of Systems: Cannot be completed due to patient being lethargic in ED.  Past Medical History  Diagnosis Date  . Unspecified constipation   . Anxiety state, unspecified   . Colitis, enteritis, and gastroenteritis of presumed infectious origin 11/02/2011  . Pneumonia, organism unspecified 11/02/2011  . Cystitis, unspecified   . Impetigo   . Renal artery stenosis   . Dementia in conditions classified elsewhere with behavioral disturbance(294.11)   . Hypercalcemia   . Reflux esophagitis   . Chest pain, unspecified   . Abnormality of gait   . Hyperosmolality and/or hypernatremia   . Unspecified disorder of liver   . Candidiasis of vulva and vagina   . Alzheimer's disease   . Chest pain, unspecified 08/19/2008  . Dementia in conditions classified elsewhere without behavioral disturbance(294.10)   . Depressive disorder, not elsewhere classified   . Esophageal  reflux   . Other and unspecified hyperlipidemia   . Loss of weight   . Unspecified persistent mental disorders due to conditions classified elsewhere   . Reactive confusion   . Unspecified essential hypertension   . Other seborrheic keratosis   . Osteoarthrosis, unspecified whether generalized or localized, unspecified site   . Stiffness of joint, not elsewhere classified, unspecified site   . Memory loss 07/24/2007  . Headache(784.0)   . Undiagnosed cardiac murmurs    Past Surgical History  Procedure Laterality Date  . Cesarean section     Social History:  reports that she has never smoked. She does not have any smokeless tobacco history on file. Her alcohol and drug histories are not on file.   No Known Allergies  History reviewed. No pertinent family history.   Prior to Admission medications   Medication Sig Start Date End Date Taking? Authorizing Provider  alendronate (FOSAMAX) 70 MG tablet Take 70 mg by mouth every 7 (seven) days. Take with a full glass of water on an empty stomach.   Yes Historical Provider, MD  ALPRAZolam Prudy Feeler) 0.5 MG tablet Take 0.5 mg by mouth at bedtime as needed for sleep. Take one tablet 3 times a day for anxiety   Yes Historical Provider, MD  hydrALAZINE (APRESOLINE) 50 MG tablet Take 50 mg by mouth 3 (three) times daily.    Yes Historical Provider, MD  losartan (COZAAR) 25 MG tablet Take 25 mg by mouth daily. Take one tablet once a day  for blood pressure   Yes Historical Provider, MD  memantine (NAMENDA) 10 MG tablet Take 10 mg by mouth 2 (two) times daily. Take one tablet twice a day for memory loss   Yes Historical Provider, MD  Multiple Vitamins-Minerals (CENTRUM SILVER PO) Take by mouth. Take one tablet once a day   Yes Historical Provider, MD  nitroGLYCERIN (NITROSTAT) 0.4 MG SL tablet Place 0.4 mg under the tongue every 5 (five) minutes as needed for chest pain. Take one tablet sublingual every 5 minutes not to exceed three tablets for chest pain    Yes Historical Provider, MD  omeprazole (PRILOSEC) 20 MG capsule TAKE 1 CAPSULE ONCE DAILY TO REDUCE STOMACH ACID AND TO HELP PROTECT ESOPHAGUS 03/26/13  Yes Mahima Pandey, MD  rosuvastatin (CRESTOR) 10 MG tablet  04/09/13  Yes Mahima Pandey, MD  sertraline (ZOLOFT) 100 MG tablet Take 100 mg by mouth daily. Take one tablet once a day   Yes Historical Provider, MD   Physical Exam: Filed Vitals:   05/01/13 2230 05/01/13 2255 05/01/13 2300 05/01/13 2300  BP: 147/86  146/72   Pulse: 115  111   Temp:    101.5 F (38.6 C)  TempSrc:    Oral  Resp: 25  31   SpO2: 95% 96% 99%     General:  Lethargic, obtunded Eyes: PEERLA ENT: mucous membranes moist Neck: supple w/o JVD Cardiovascular: tachycardic, w/o MRG Respiratory: coarse BS bilaterally, accessory muscle use noted, tachypnea noted Abdomen: soft, nt, nd, bs+ Skin: no rash nor lesion Musculoskeletal: MAE Psychiatric: AMS Neurologic: unable to assess fully due to patient mental status  Labs on Admission:  Basic Metabolic Panel:  Recent Labs Lab 05/01/13 1827  NA 139  K 3.4*  CL 105  CO2 24  GLUCOSE 134*  BUN 14  CREATININE 0.89  CALCIUM 9.7   Liver Function Tests:  Recent Labs Lab 05/01/13 1827  AST 53*  ALT 52*  ALKPHOS 80  BILITOT 0.5  PROT 8.1  ALBUMIN 3.1*    Recent Labs Lab 05/01/13 1827  LIPASE 19   No results found for this basename: AMMONIA,  in the last 168 hours CBC:  Recent Labs Lab 05/01/13 1827  WBC 20.7*  NEUTROABS 17.4*  HGB 12.0  HCT 35.3*  MCV 95.1  PLT 192   Cardiac Enzymes: No results found for this basename: CKTOTAL, CKMB, CKMBINDEX, TROPONINI,  in the last 168 hours  BNP (last 3 results) No results found for this basename: PROBNP,  in the last 8760 hours CBG: No results found for this basename: GLUCAP,  in the last 168 hours  Radiological Exams on Admission: Dg Chest 2 View  05/01/2013   *RADIOLOGY REPORT*  Clinical Data: Shortness of breath.  CHEST - 2 VIEW  Comparison:  None.  Findings: Mild cardiomegaly with vascular congestion.  Diffuse interstitial prominence may reflect interstitial edema.  Bibasilar atelectasis.  No effusions.  No acute bony abnormality.  IMPRESSION: Suspect mild edema/CHF.  Bibasilar atelectasis.   Original Report Authenticated By: Charlett Nose, M.D.   Ct Abdomen Pelvis W Contrast  05/01/2013   *RADIOLOGY REPORT*  Clinical Data: Shortness of breath, fever.  CT ABDOMEN AND PELVIS WITH CONTRAST  Technique:  Multidetector CT imaging of the abdomen and pelvis was performed following the standard protocol during bolus administration of intravenous contrast.  Contrast: OMNIPAQUE IOHEXOL 300 MG/ML  SOLN  Comparison: None.  Findings: Somewhat nodular appearing airspace opacities are noted in the visualized lower lung fields bilaterally.  I  am concerned this likely represents pneumonia.  No effusions.  Heart is borderline in size.  Liver, gallbladder, spleen, pancreas, adrenals and right kidney are unremarkable.  Small cysts in the left kidney.  No hydronephrosis.  Moderate stool throughout the colon.  Small bowel is decompressed.  There is air centrally within the uterus, likely within the endometrial cavity.  If there has not been recent instrumentation, I cannot exclude infection/endometritis.  Calcified fibroids throughout the uterus.  No adnexal masses.  Urinary bladder is unremarkable.  Aorta is normal caliber.  IMPRESSION: Patchy bilateral somewhat nodular appearing opacities in the lower lungs concerning for pneumonia.  Small amount of gas centrally within the uterus, presumably within the endometrium.  If the patient has not had a recent instrumentation, this could represent infection/endometritis. Recommend clinical correlation.   Original Report Authenticated By: Charlett Nose, M.D.    EKG: Independently reviewed.  Assessment/Plan Principal Problem:   Sepsis Active Problems:   CAP (community acquired pneumonia)   Acute endometritis    Altered mental status   Dementia   1. Sepsis due to CAP and possibly acute endometritis - broad spectrum ABx therapy with rocephin, azithromycin, and adding flagyl for anaerobes with possible endometritis.  Continuous pulse ox, tele monitor, and close monitoring for deterioration in condition in the SDU, have very low threshold at this point for proceeding with endotracheal intubation but ABGs obtained after she was noted to be very tachypnic do not yet indicate the need for this. 2. H/o HTN - continue home meds, hold these if BP starts to decline or she is unable to take PO (which seems likely, most likely will end up needing to be switched to PRN meds in AM if her mental status dosent improve) 3. AMS - delirium on top of chronic dementia, would recommend halidol as primary sedation and benzos only when absolutely needed.   77 yo F in serious condition at this point, as I communicated with family may deteriorate without warning at any time during hospital stay.  Very low threshold for intubation, CCM consult, and transfer to ICU.  Code Status: Full Code per daughter, does want intubation if needed (must indicate code status--if unknown or must be presumed, indicate so) Family Communication: Spoke with daughter at bedside (indicate person spoken with, if applicable, with phone number if by telephone) Disposition Plan: Admit to inpatient stepdown (indicate anticipated LOS)  Time spent: 70 min  Sherill Wegener M. [facility] Pager [phone]  If 7PM-7AM, please contact night-coverage [email or web address] Password [facility] 05/02/2013, 12:06 AM

## 2013-05-02 NOTE — Progress Notes (Signed)
ANTIBIOTIC CONSULT NOTE - INITIAL  Pharmacy Consult for Levaquin Indication: pneumonia  No Known Allergies  Vital Signs: Temp: 102.6 Long (39.2 C) (07/11 1218) Temp src: Axillary (07/11 1218) BP: 111/58 mmHg (07/11 1300) Pulse Rate: 112 (07/11 1300) Intake/Output from previous day: 07/10 0701 - 07/11 0700 In: -  Out: 250 [Urine:250] Intake/Output from this shift:    Labs:  Recent Labs  05/01/13 1827 05/02/13 1045  WBC 20.7* 19.9*  HGB 12.0 12.8  PLT 192 188  CREATININE 0.89 0.Sabrina   CrCl is unknown because there is no height on file for the current visit. No results found for this basename: VANCOTROUGH, Leodis Binet, VANCORANDOM, GENTTROUGH, GENTPEAK, GENTRANDOM, TOBRATROUGH, TOBRAPEAK, TOBRARND, AMIKACINPEAK, AMIKACINTROU, AMIKACIN,  in the last 72 hours   Microbiology: Recent Results (from the past 720 hour(s))  MRSA PCR SCREENING     Status: Abnormal   Collection Time    05/02/13 12:50 AM      Result Value Range Status   MRSA by PCR POSITIVE (*) NEGATIVE Final   Comment:            The GeneXpert MRSA Assay (FDA     approved for NASAL specimens     only), is one component of a     comprehensive MRSA colonization     surveillance program. It is not     intended to diagnose MRSA     infection nor to guide or     monitor treatment for     MRSA infections.     RESULT CALLED TO, READ BACK BY AND VERIFIED WITH:     CALLED TO RN Nolon Nations [id number] @0427  THANEY    Medical History: Past Medical History  Diagnosis Date  . Unspecified constipation   . Anxiety state, unspecified   . Colitis, enteritis, and gastroenteritis of presumed infectious origin 11/02/2011  . Pneumonia, organism unspecified 11/02/2011  . Cystitis, unspecified   . Impetigo   . Renal artery stenosis   . Dementia in conditions classified elsewhere with behavioral disturbance(294.11)   . Hypercalcemia   . Reflux esophagitis   . Chest pain, unspecified   . Abnormality of gait   . Hyperosmolality  and/or hypernatremia   . Unspecified disorder of liver   . Candidiasis of vulva and vagina   . Alzheimer's disease   . Chest pain, unspecified 08/19/2008  . Dementia in conditions classified elsewhere without behavioral disturbance(294.10)   . Depressive disorder, not elsewhere classified   . Esophageal reflux   . Other and unspecified hyperlipidemia   . Loss of weight   . Unspecified persistent mental disorders due to conditions classified elsewhere   . Reactive confusion   . Unspecified essential hypertension   . Other seborrheic keratosis   . Osteoarthrosis, unspecified whether generalized or localized, unspecified site   . Stiffness of joint, not elsewhere classified, unspecified site   . Memory loss 07/24/2007  . Headache(784.0)   . Undiagnosed cardiac murmurs     Medications:  Scheduled:  . atorvastatin  10 mg Oral q1800  . Chlorhexidine Gluconate Cloth  6 each Topical Q0600  . heparin  5,000 Units Subcutaneous Q8H  . hydrALAZINE  50 mg Oral Q8H  . losartan  25 mg Oral Daily  . memantine  10 mg Oral BID  . metoprolol tartrate  12.5 mg Oral BID  . mupirocin ointment  1 application Nasal BID  . pantoprazole  40 mg Oral Daily  . sertraline  100 mg Oral Daily   Assessment: 77  y/o Long who was sent from [facility] with PNA. WBC 19.9<20.7, Scr 0.Sabrina, with CrCl ~60. Tmax 102.6.   7/11 Levaquin >> 7/10 CTX>>7/11 7/10 Azithromycin >>7/11  7/11 Blood x 2>> 7/11 Urine>>  Goal of Therapy:  Eradication of infection  Plan:  -Start Levaquin 750mg  IV q24h -Trend WBC, temp, renal function -Long/U micro data -Change to PO soon  Thank you for allowing me to take part in this patient's care,  Abran Duke, PharmD Clinical Pharmacist Phone: [phone] Pager: [phone] 05/02/2013 1:57 PM

## 2013-05-02 NOTE — Progress Notes (Signed)
PROGRESS NOTE  Sabrina Long EAV:[id number] DOB: [birth year] DOA: 05/01/2013 PCP: Bufford Spikes, DO  Brief narrative: 77 year old female with end stage dementia, renal artery stenosis, hyperlipidemia, gastro-esophageal reflux admitted 05/01/2013 with potential hospital-acquired pneumonia and? Endometritis  Past medical history-As per Problem list Chart reviewed as below- No reports available  Consultants:  None currently  Procedures:  Chest x-ray two-view 05/01/2013 = mild edema/CHF, bibasilar atelectasis  CT abdomen 05/01/2013% patchy bilateral nodular opacities lower lung concern for pneumonia, small amount of gas and fluid in the uterus,? Endometrial  Antibiotics:  Azithromycin 05/01/2013  Ceftriaxone 05/01/13  Flagyl 05/01/2013   Subjective  Pleasantly demented.  Eating at bedside without any specific distress.  Per daughter seems at her baseline.  Can sey hello and talk, but mostly it is nonsensical.  She has no orientation Daughter states has not had any of her regular medicines over the past day or so   Objective    Interim History: Chart reviewed  Telemetry:  Sinus tachycardia  Objective: Filed Vitals:   05/02/13 0500 05/02/13 0600 05/02/13 0700 05/02/13 0803  BP: 132/73 149/88 147/97 129/68  Pulse: 105 109 110 106  Temp:    101.9 F (38.8 C)  TempSrc:    Oral  Resp: 26 24 31 28   SpO2: 99% 99% 100% 100%    Intake/Output Summary (Last 24 hours) at 05/02/13 0842 Last data filed at 05/02/13 0600  Gross per 24 hour  Intake      0 ml  Output    250 ml  Net   -250 ml    Exam:  General: awake, eating.  Not oriented Cardiovascular: s1 s2 no m/r/g Respiratory: clear Abdomen: soft, NT/ND, No rebound or gaurding-noted soft formed stool in bed Skin Intact Neuro grossly moves all 4 limbs  Data Reviewed: Basic Metabolic Panel:  Recent Labs Lab 05/01/13 1827  NA 139  K 3.4*  CL 105  CO2 24  GLUCOSE 134*  BUN 14  CREATININE 0.89  CALCIUM  9.7   Liver Function Tests:  Recent Labs Lab 05/01/13 1827  AST 53*  ALT 52*  ALKPHOS 80  BILITOT 0.5  PROT 8.1  ALBUMIN 3.1*    Recent Labs Lab 05/01/13 1827  LIPASE 19   No results found for this basename: AMMONIA,  in the last 168 hours CBC:  Recent Labs Lab 05/01/13 1827  WBC 20.7*  NEUTROABS 17.4*  HGB 12.0  HCT 35.3*  MCV 95.1  PLT 192   Cardiac Enzymes: No results found for this basename: CKTOTAL, CKMB, CKMBINDEX, TROPONINI,  in the last 168 hours BNP: No components found with this basename: POCBNP,  CBG: No results found for this basename: GLUCAP,  in the last 168 hours  Recent Results (from the past 240 hour(s))  MRSA PCR SCREENING     Status: Abnormal   Collection Time    05/02/13 12:50 AM      Result Value Range Status   MRSA by PCR POSITIVE (*) NEGATIVE Final   Comment:            The GeneXpert MRSA Assay (FDA     approved for NASAL specimens     only), is one component of a     comprehensive MRSA colonization     surveillance program. It is not     intended to diagnose MRSA     infection nor to guide or     monitor treatment for     MRSA infections.     RESULT  CALLED TO, READ BACK BY AND VERIFIED WITH:     CALLED TO RN Nolon Nations [id number] @0427  THANEY     Studies:              All Imaging reviewed and is as per above notation   Scheduled Meds: . atorvastatin  10 mg Oral q1800  . azithromycin  500 mg Intravenous Q24H  . cefTRIAXone (ROCEPHIN)  IV  1 g Intravenous Q24H  . Chlorhexidine Gluconate Cloth  6 each Topical Q0600  . heparin  5,000 Units Subcutaneous Q8H  . hydrALAZINE  50 mg Oral Q8H  . losartan  25 mg Oral Daily  . memantine  10 mg Oral BID  . metronidazole  500 mg Intravenous Q8H  . mupirocin ointment  1 application Nasal BID  . pantoprazole  40 mg Oral Daily  . sertraline  100 mg Oral Daily   Continuous Infusions:    Assessment/Plan: 1. SIRS-moderate-Likely CAP, ? Aspiration-continue Azithro/Rocephin.  Obtain WBC in  am.  consider Rpt CXR.  NO overt speech deficit at bedside and seems to swallow well without cough.  UC ordered this am 2. ?Endometritis-very unlikely.  Not having any sexual contact currently and lives at home with husband.  D/c flagyl 3. ?CHF-Careful with volume-would just keeop at 50 cc/hour for now to balance fluid status-get BNP to rule out.  Discussion with family held and would want this treated but after exploring options are okay with holding off on Echo 4. Sinus Tachycardia-has not received her regular medicines.  Will re-implement.  Could be 2/2 to #1.  Could also be anxiety.  Monitor 5. Anxiety-continue Xanax 0.5 q pm prn 6. End-stage dementia without behavioral disturbances-continue Namenda 10 bid, Sertraline 100 daily 7. HLd-? Mortality benefit Lipitor 20 daily  Code Status: FUll Family Communication: Discussed with Daughters including HCPOA at bedside Disposition Plan:  inpatient   Pleas Koch, MD  [facility] Pager [phone] 05/02/2013, 8:42 AM    LOS: 1 day

## 2013-05-03 ENCOUNTER — Inpatient Hospital Stay (HOSPITAL_COMMUNITY): Payer: Medicare Other

## 2013-05-03 LAB — COMPREHENSIVE METABOLIC PANEL
BUN: 11 mg/dL (ref 6–23)
CO2: 23 mEq/L (ref 19–32)
Chloride: 109 mEq/L (ref 96–112)
Creatinine, Ser: 0.68 mg/dL (ref 0.50–1.10)
GFR calc Af Amer: >90 mL/min (ref >90)
GFR calc non Af Amer: 84 mL/min — ABNORMAL LOW (ref >90)
Glucose, Bld: 128 mg/dL — ABNORMAL HIGH (ref 70–99)
Total Bilirubin: 0.3 mg/dL (ref 0.3–1.2)

## 2013-05-03 LAB — PRO B NATRIURETIC PEPTIDE: Pro B Natriuretic peptide (BNP): 440.8 pg/mL — ABNORMAL HIGH (ref 0–125)

## 2013-05-03 LAB — CBC
HCT: 33.3 % — ABNORMAL LOW (ref 36.0–46.0)
MCV: 94.9 fL (ref 78.0–100.0)
RDW: 14.7 % (ref 11.5–15.5)
WBC: 11.6 10*3/uL — ABNORMAL HIGH (ref 4.0–10.5)

## 2013-05-03 LAB — URINE CULTURE

## 2013-05-03 NOTE — Progress Notes (Signed)
PROGRESS NOTE  Sabrina Long:[id number] DOB: Feb 28, 1936 DOA: 05/01/2013 PCP: Bufford Spikes, DO  Brief narrative: 77 year old female with end stage dementia, renal artery stenosis, hyperlipidemia, gastro-esophageal reflux admitted 05/01/2013 with potential hospital-acquired pneumonia and? Endometritis.  She eventually had urine that was positive for Legionella and treatment was transitioned to Levaquin IV. Initially very tachycardic, anxious  Past medical history-As per Problem list Chart reviewed as below- No reports available  Consultants:  None currently  Procedures:  Chest x-ray two-view 05/01/2013 = mild edema/CHF, bibasilar atelectasis  CT abdomen 05/01/2013% patchy bilateral nodular opacities lower lung concern for pneumonia, small amount of gas and fluid in the uterus,? Endometrial  Antibiotics:  Azithromycin 05/01/2013  Ceftriaxone 05/01/13  Flagyl 05/01/2013   Subjective  Pleasantly demented. Sleepy but rousable.  No real c/o noted overnight and patient Has been relatively stable.   Objective    Interim History: Chart reviewed  Telemetry:  NSR   Objective: Filed Vitals:   05/03/13 0015 05/03/13 0412 05/03/13 0600 05/03/13 0718  BP: 103/50 113/58 127/60 105/55  Pulse: 89 93 97 92  Temp:  98.4 F (36.9 C)  98.9 F (37.2 C)  TempSrc:  Axillary  Axillary  Resp: 23 22 27 21   Height:      Weight:  70 kg (154 lb 5.2 oz)    SpO2: 95% 98% 97%     Intake/Output Summary (Last 24 hours) at 05/03/13 0837 Last data filed at 05/03/13 0600  Gross per 24 hour  Intake 2202.5 ml  Output   1100 ml  Net 1102.5 ml    Exam:  General: awake, eating.  Not oriented.  Smiles at conversation Cardiovascular: s1 s2 no m/r/g Respiratory: clear Abdomen: soft, NT/ND, No rebound or gaurding-noted soft  Skin Intact Neuro grossly moves all 4 limbs  Data Reviewed: Basic Metabolic Panel:  Recent Labs Lab 05/01/13 1827 05/02/13 1045  NA 139 137  K 3.4* 4.9   CL 105 103  CO2 24 23  GLUCOSE 134* 155*  BUN 14 11  CREATININE 0.89 0.74  CALCIUM 9.7 9.8   Liver Function Tests:  Recent Labs Lab 05/01/13 1827  AST 53*  ALT 52*  ALKPHOS 80  BILITOT 0.5  PROT 8.1  ALBUMIN 3.1*    Recent Labs Lab 05/01/13 1827  LIPASE 19   No results found for this basename: AMMONIA,  in the last 168 hours CBC:  Recent Labs Lab 05/01/13 1827 05/02/13 1045  WBC 20.7* 19.9*  NEUTROABS 17.4*  --   HGB 12.0 12.8  HCT 35.3* 37.6  MCV 95.1 95.9  PLT 192 188   Cardiac Enzymes: No results found for this basename: CKTOTAL, CKMB, CKMBINDEX, TROPONINI,  in the last 168 hours BNP: No components found with this basename: POCBNP,  CBG: No results found for this basename: GLUCAP,  in the last 168 hours  Recent Results (from the past 240 hour(s))  MRSA PCR SCREENING     Status: Abnormal   Collection Time    05/02/13 12:50 AM      Result Value Range Status   MRSA by PCR POSITIVE (*) NEGATIVE Final   Comment:            The GeneXpert MRSA Assay (FDA     approved for NASAL specimens     only), is one component of a     comprehensive MRSA colonization     surveillance program. It is not     intended to diagnose MRSA     infection  nor to guide or     monitor treatment for     MRSA infections.     RESULT CALLED TO, READ BACK BY AND VERIFIED WITH:     CALLED TO RN Nolon Nations [id number] @0427  THANEY     Studies:              All Imaging reviewed and is as per above notation   Scheduled Meds: . atorvastatin  10 mg Oral q1800  . Chlorhexidine Gluconate Cloth  6 each Topical Q0600  . feeding supplement  237 mL Oral BID BM  . heparin  5,000 Units Subcutaneous Q8H  . hydrALAZINE  50 mg Oral Q8H  . levofloxacin (LEVAQUIN) IV  750 mg Intravenous Q24H  . losartan  25 mg Oral Daily  . memantine  10 mg Oral BID  . metoprolol tartrate  12.5 mg Oral BID  . mupirocin ointment  1 application Nasal BID  . pantoprazole  40 mg Oral Daily  . sertraline  100 mg  Oral Daily   Continuous Infusions: . sodium chloride 100 mL/hr at 05/03/13 0001     Assessment/Plan: 1. SIRS-moderate-Likely Legionella pneumonia- Azithro/Rocephin transitioned to Levaquin.  White count stable at 19.  Repeating two-view chest x-ray this morning-handouts to be given to patient per nursing 2. Abdominal pain ?Endometritis-very unlikely.  Not having any sexual contact currently and lives at home with husband.  D/c flagyl 7/11.  Tolerating by mouth well. Suspect this is related to # 6 3. ?CHF-Careful with volume-increased from 50 cc-->100cc/hour 7/11 to balance fluid status-get BNP to rule out CHF 4. Sinus Tachycardia-has not received her regular medicines.  Will re-implement.  Could be 2/2 to #1.  Could also be anxiety.   Was started on beta blocker 12.5 metoprolol twice a day. This has been held given borderline low pressures 5. Anxiety-continue Xanax 0.5 q pm prn 6. End-stage dementia without behavioral disturbances-continue Namenda 10 bid, Sertraline 100 daily 7. HLd-? Mortality benefit Lipitor 20 daily  Code Status: FUll Family Communication: Discussed with Daughters including HCPOA at bedside. Patient usually does ambulate. PT OT to see patient for best options subsequent to hopsitla stay as she has not been up and about as yet Disposition Plan:  inpatient   Pleas Koch, MD  [facility] Pager [phone] 05/03/2013, 8:37 AM    LOS: 2 days

## 2013-05-04 LAB — COMPREHENSIVE METABOLIC PANEL
ALT: 56 U/L — ABNORMAL HIGH (ref 0–35)
AST: 70 U/L — ABNORMAL HIGH (ref 0–37)
Albumin: 2.4 g/dL — ABNORMAL LOW (ref 3.5–5.2)
CO2: 24 mEq/L (ref 19–32)
Calcium: 9.2 mg/dL (ref 8.4–10.5)
Chloride: 110 mEq/L (ref 96–112)
GFR calc non Af Amer: 86 mL/min — ABNORMAL LOW (ref >90)
Sodium: 140 mEq/L (ref 135–145)
Total Bilirubin: 0.3 mg/dL (ref 0.3–1.2)

## 2013-05-04 LAB — CBC WITH DIFFERENTIAL/PLATELET
Basophils Absolute: 0 10*3/uL (ref 0.0–0.1)
Basophils Relative: 0 % (ref 0–1)
Lymphocytes Relative: 18 % (ref 12–46)
Neutro Abs: 6.6 10*3/uL (ref 1.7–7.7)
Platelets: 221 10*3/uL (ref 150–400)
RDW: 14.6 % (ref 11.5–15.5)
WBC: 9.1 10*3/uL (ref 4.0–10.5)

## 2013-05-04 MED ORDER — LEVOFLOXACIN 750 MG PO TABS
750.0000 mg | ORAL_TABLET | Freq: Every day | ORAL | Status: DC
  Administered 2013-05-05: 750 mg via ORAL
  Filled 2013-05-04: qty 1

## 2013-05-04 MED ORDER — LEVOFLOXACIN 750 MG PO TABS
750.0000 mg | ORAL_TABLET | Freq: Every day | ORAL | Status: DC
  Filled 2013-05-04: qty 1

## 2013-05-04 NOTE — Evaluation (Addendum)
Physical Therapy Evaluation Patient Details Name: Sabrina Long MRN: [id number] DOB: [birth year] Today's Date: 05/04/2013 Time: 4098-1191 PT Time Calculation (min): 30 min  PT Assessment / Plan / Recommendation History of Present Illness  77 year old female with end stage dementia, renal artery stenosis, hyperlipidemia, gastro-esophageal reflux admitted 05/01/2013 with potential hospital-acquired pneumonia and? Endometritis.  She eventually had urine that was positive for Legionella and treatment was transitioned to Levaquin IV. Initially very tachycardic, anxious.    Clinical Impression  Limited evaluation due to pt's cognition and advance dementia.  Prior to admission pt was ambulating with supervision and performing all ADLs with supervision per husband.  Pt now needs +2 (A) to complete all mobility.  Family wants pt to go home at d/c or to inpatient rehab.  Explained the criteria to get into inpatient rehab and family agreed that inpatient rehab may not be appropriate.  However family refusing SNF option.  Therefore pt will need HHPT/OT, HH aide with 24 hour assistance at d/c.  Pt may benefit from hospital bed.  Will continue to follow to continue to assess overall mobility and any DME needs to decrease overall burden of care.     PT Assessment  Patient needs continued PT services    Follow Up Recommendations  Home health PT;Supervision/Assistance - 24 hour (Family prefer HHPT)    Equipment Recommendations  None recommended by PT    Recommendations for Other Services     Frequency Min 3X/week    Precautions / Restrictions Precautions Precautions: Fall   Pertinent Vitals/Pain No c/o pain      Mobility  Bed Mobility Bed Mobility: Supine to Sit;Sitting - Scoot to Edge of Bed Supine to Sit: 3: Mod assist;With rails Sitting - Scoot to Edge of Bed: 3: Mod assist (with pad) Details for Bed Mobility Assistance: (A) to elevate trunk OOB with tactile cues for  techinque Transfers Transfers: Sit to Stand;Stand to Sit;Stand Pivot Transfers Sit to Stand: 1: +2 Total assist;From bed Sit to Stand: Patient Percentage: 50% Stand to Sit: 1: +2 Total assist;To chair/3-in-1 Stand to Sit: Patient Percentage: 50% Stand Pivot Transfers: 1: +2 Total assist Stand Pivot Transfers: Patient Percentage: 50% Details for Transfer Assistance: +2 (A) to initiate transfer with max manual cues for techinque.  Pt unable to follow one step commands and needed several manual cues to complete task.  Ambulation/Gait Ambulation/Gait Assistance: Not tested (comment)    Exercises     PT Diagnosis: Difficulty walking;Abnormality of gait;Generalized weakness  PT Problem List: Decreased strength;Decreased activity tolerance;Decreased balance;Decreased mobility;Decreased knowledge of use of DME;Decreased cognition;Decreased coordination PT Treatment Interventions: DME instruction;Gait training;Functional mobility training;Therapeutic activities;Therapeutic exercise;Balance training;Cognitive remediation;Patient/family education;Neuromuscular re-education     PT Goals(Current goals can be found in the care plan section) Acute Rehab PT Goals Patient Stated Goal: did not set; family wishes pt to go home depending on mobility PT Goal Formulation: With family Potential to Achieve Goals: Fair  Visit Information  Last PT Received On: 05/04/13 Assistance Needed: +2 History of Present Illness: 77 year old female with end stage dementia, renal artery stenosis, hyperlipidemia, gastro-esophageal reflux admitted 05/01/2013 with potential hospital-acquired pneumonia and? Endometritis.  She eventually had urine that was positive for Legionella and treatment was transitioned to Levaquin IV. Initially very tachycardic, anxious.         Prior Functioning  Home Living Family/patient expects to be discharged to:: Private residence Living Arrangements: Spouse/significant  other;Children Available Help at Discharge: Family Type of Home: House Prior Function Level of Independence: Independent with assistive  device(s) (Family reported only needed supervision for ADLs and gait) Communication Communication: Receptive difficulties;Expressive difficulties    Cognition  Cognition Arousal/Alertness: Awake/alert Behavior During Therapy: Restless Overall Cognitive Status: History of cognitive impairments - at baseline    Extremity/Trunk Assessment Lower Extremity Assessment Lower Extremity Assessment: Generalized weakness   Balance Balance Balance Assessed: Yes Static Sitting Balance Static Sitting - Balance Support: Feet supported Static Sitting - Level of Assistance: 4: Min assist Static Sitting - Comment/# of Minutes: (A) to maintain balance with cues for upright posture  End of Session PT - End of Session Equipment Utilized During Treatment: Gait belt Activity Tolerance: Other (comment) (Limited due to cognition) Patient left: in chair;with call bell/phone within reach Nurse Communication: Mobility status  GP     Beautifull Cisar 05/04/2013, 4:20 PM  [place], PT DPT [phone]

## 2013-05-04 NOTE — Progress Notes (Signed)
PROGRESS NOTE  Sabrina Long ZOX:[id number] DOB: 22-Aug-1936 DOA: 05/01/2013 PCP: Bufford Spikes, DO  Brief narrative: 77 year old female with end stage dementia, renal artery stenosis, hyperlipidemia, gastro-esophageal reflux admitted 05/01/2013 with potential hospital-acquired pneumonia and? Endometritis.  She eventually had urine that was positive for Legionella and treatment was transitioned to Levaquin IV. Initially very tachycardic, anxious  Past medical history-As per Problem list Chart reviewed as below- No reports available  Consultants:  None currently  Procedures:  Chest x-ray two-view 05/01/2013 = mild edema/CHF, bibasilar atelectasis  CT abdomen 05/01/2013% patchy bilateral nodular opacities lower lung concern for pneumonia, small amount of gas and fluid in the uterus,? Endometrial  Antibiotics:  Azithromycin 05/01/2013  Ceftriaxone 05/01/13  Flagyl 05/01/2013  levaquin 7/11>>   Subjective  Pleasantly demented. Looks well in fact much better than prior Family confirms this is her regular baseline   Objective    Interim History: Chart reviewed  Telemetry:  NSR   Objective: Filed Vitals:   05/03/13 1846 05/03/13 2036 05/04/13 0513 05/04/13 1346  BP:  161/76 174/98 159/85  Pulse:  97 84 82  Temp: 100.3 F (37.9 C) 98.5 F (36.9 C) 98.6 F (37 C) 98.9 F (37.2 C)  TempSrc: Rectal Oral Axillary Oral  Resp:  18 18 18   Height:      Weight:      SpO2:  96% 99% 97%    Intake/Output Summary (Last 24 hours) at 05/04/13 1639 Last data filed at 05/04/13 0631  Gross per 24 hour  Intake   1500 ml  Output   1150 ml  Net    350 ml    Exam:  General: awake, eating.  Not oriented.  Smiles at conversation Cardiovascular: s1 s2 no m/r/g Respiratory: clear Abdomen: soft, NT/ND, No rebound or gaurding-noted soft  Skin Intact Neuro grossly moves all 4 limbs  Data Reviewed: Basic Metabolic Panel:  Recent Labs Lab 05/01/13 1827 05/02/13 1045  05/03/13 1200 05/04/13 0345  NA 139 137 141 140  K 3.4* 4.9 3.6 3.3*  CL 105 103 109 110  CO2 24 23 23 24   GLUCOSE 134* 155* 128* 112*  BUN 14 11 11 9   CREATININE 0.89 0.74 0.68 0.63  CALCIUM 9.7 9.8 9.4 9.2   Liver Function Tests:  Recent Labs Lab 05/01/13 1827 05/03/13 1200 05/04/13 0345  AST 53* 44* 70*  ALT 52* 42* 56*  ALKPHOS 80 71 86  BILITOT 0.5 0.3 0.3  PROT 8.1 7.1 6.7  ALBUMIN 3.1* 2.5* 2.4*    Recent Labs Lab 05/01/13 1827  LIPASE 19   No results found for this basename: AMMONIA,  in the last 168 hours CBC:  Recent Labs Lab 05/01/13 1827 05/02/13 1045 05/03/13 1200 05/04/13 0345  WBC 20.7* 19.9* 11.6* 9.1  NEUTROABS 17.4*  --   --  6.6  HGB 12.0 12.8 11.1* 10.2*  HCT 35.3* 37.6 33.3* 31.2*  MCV 95.1 95.9 94.9 95.1  PLT 192 188 212 221   Cardiac Enzymes: No results found for this basename: CKTOTAL, CKMB, CKMBINDEX, TROPONINI,  in the last 168 hours BNP: No components found with this basename: POCBNP,  CBG: No results found for this basename: GLUCAP,  in the last 168 hours  Recent Results (from the past 240 hour(s))  CULTURE, BLOOD (ROUTINE X 2)     Status: None   Collection Time    05/02/13 12:02 AM      Result Value Range Status   Specimen Description BLOOD RIGHT ARM   Final  Special Requests BOTTLES DRAWN AEROBIC AND ANAEROBIC 10CC   Final   Culture  Setup Time 05/02/2013 05:00   Final   Culture     Final   Value:        BLOOD CULTURE RECEIVED NO GROWTH TO DATE CULTURE WILL BE HELD FOR 5 DAYS BEFORE ISSUING A FINAL NEGATIVE REPORT   Report Status PENDING   Incomplete  CULTURE, BLOOD (ROUTINE X 2)     Status: None   Collection Time    05/02/13 12:12 AM      Result Value Range Status   Specimen Description BLOOD LEFT HAND   Final   Special Requests BOTTLES DRAWN AEROBIC ONLY 7CC   Final   Culture  Setup Time 05/02/2013 05:00   Final   Culture     Final   Value:        BLOOD CULTURE RECEIVED NO GROWTH TO DATE CULTURE WILL BE HELD  FOR 5 DAYS BEFORE ISSUING A FINAL NEGATIVE REPORT   Report Status PENDING   Incomplete  MRSA PCR SCREENING     Status: Abnormal   Collection Time    05/02/13 12:50 AM      Result Value Range Status   MRSA by PCR POSITIVE (*) NEGATIVE Final   Comment:            The GeneXpert MRSA Assay (FDA     approved for NASAL specimens     only), is one component of a     comprehensive MRSA colonization     surveillance program. It is not     intended to diagnose MRSA     infection nor to guide or     monitor treatment for     MRSA infections.     RESULT CALLED TO, READ BACK BY AND VERIFIED WITH:     CALLED TO RN Nolon Nations [phone] @0427  THANEY  URINE CULTURE     Status: None   Collection Time    05/02/13  5:50 PM      Result Value Range Status   Specimen Description URINE, CATHETERIZED   Final   Special Requests NONE   Final   Culture  Setup Time 05/02/2013 23:54   Final   Colony Count NO GROWTH   Final   Culture NO GROWTH   Final   Report Status 05/03/2013 FINAL   Final     Studies:              All Imaging reviewed and is as per above notation   Scheduled Meds: . atorvastatin  10 mg Oral q1800  . Chlorhexidine Gluconate Cloth  6 each Topical Q0600  . feeding supplement  237 mL Oral BID BM  . heparin  5,000 Units Subcutaneous Q8H  . hydrALAZINE  50 mg Oral Q8H  . levofloxacin  750 mg Oral Daily  . losartan  25 mg Oral Daily  . memantine  10 mg Oral BID  . metoprolol tartrate  12.5 mg Oral BID  . mupirocin ointment  1 application Nasal BID  . pantoprazole  40 mg Oral Daily  . sertraline  100 mg Oral Daily   Continuous Infusions:     Assessment/Plan: 1. SIRS-moderate-Likely Legionella pneumonia- Azithro/Rocephin transitioned to Levaquin.  White count stable 19>11.6>>9  2 view x-ray redemonstrated pneumonia. Discontinue IV fluids-transitioned to oral Levaquin 7/13 2. Abdominal pain ?Endometritis-very unlikely.  Not having any sexual contact currently and lives at home with  husband.  D/c flagyl 7/11.  Tolerating  by mouth well. Suspect this is related to # 6 3. Probable compnesated CHF-BNp. was 440-fluids-increased from 50 cc-->100cc/hour 7/11-now discontinued 4. Sinus Tachycardia-has not received her regular medicines.  Will re-implement.  Could be 2/2 to #1.  Could also be anxiety.   Was started on beta blocker 12.5 metoprolol twice a day.  5. Hypertension-continue losartan 25 mg daily, thousand 50 3 times a day, metoprolol 25 twice a day [started for sinus tachycardia] 6. Anxiety-continue Xanax 0.5 q pm prn 7. End-stage dementia without behavioral disturbances-continue Namenda 10 bid, Sertraline 100 daily 8. HLd-? Mortality benefit Lipitor 20 daily  Code Status: FUll Family Communication: Discussed with Daughters including HCPOA at bedside. Patient usually does ambulate. PT OT recommended potentially all healthy he has strong levels of support at home Disposition Plan:  inpatient   Pleas Koch, MD  [facility] Pager [phone] 05/04/2013, 4:39 PM    LOS: 3 days

## 2013-05-05 DIAGNOSIS — A481 Legionnaires' disease: Secondary | ICD-10-CM

## 2013-05-05 MED ORDER — METOPROLOL TARTRATE 12.5 MG HALF TABLET: 12.5000 mg | ORAL_TABLET | Freq: Two times a day (BID) | ORAL | Status: DC

## 2013-05-05 MED ORDER — LEVOFLOXACIN 750 MG PO TABS: 750.0000 mg | ORAL_TABLET | Freq: Every day | ORAL | Status: DC

## 2013-05-05 NOTE — Progress Notes (Signed)
Physical Therapy Treatment Patient Details Name: Sabrina Long MRN: [id number] DOB: [birth year] Today's Date: 05/05/2013 Time: 4098-1191 PT Time Calculation (min): 10 min  PT Assessment / Plan / Recommendation  PT Comments   Pt admitted with PNA. Pt currently with functional limitations due to poor cognition and generalized weakness.   Pt will benefit from skilled PT to increase their independence and safety with mobility to allow discharge to the venue listed below. Daughter was present and ex[ressed concern about pt going home with the husband secondary to pt was needing 2 persons last visit and is difficult to manage at times.  Daughter states she thought pt should go to a NH for therapy but husband refuses.  If so, max HH services recommended.    Follow Up Recommendations  Home health PT;Supervision/Assistance - 24 hour; HHAide, HHRN, HHSW                 Equipment Recommendations  None recommended by PT        Frequency Min 3X/week   Progress towards PT Goals Progress towards PT goals: Progressing toward goals  Plan Current plan remains appropriate    Precautions / Restrictions Precautions Precautions: Fall Restrictions Weight Bearing Restrictions: No   Pertinent Vitals/Pain VSS, No pain    Mobility  Bed Mobility Bed Mobility: Supine to Sit;Sitting - Scoot to Edge of Bed Supine to Sit: 3: Mod assist;With rails Sitting - Scoot to Edge of Bed: 4: Min assist Details for Bed Mobility Assistance: cues for sequencing and technique Transfers Transfers: Sit to Stand;Stand to Sit;Stand Pivot Transfers Sit to Stand: 1: +2 Total assist;With upper extremity assist;From bed Sit to Stand: Patient Percentage: 70% Stand to Sit: 4: Min assist;With upper extremity assist;To chair/3-in-1 Stand Pivot Transfers: Not tested (comment) Details for Transfer Assistance: Daughter was present.  Pt stood and was steady for the most part. Ambulation/Gait Ambulation/Gait Assistance: 4: Min  assist;3: Mod assist Ambulation Distance (Feet): 70 Feet Assistive device: 1 person hand held assist Ambulation/Gait Assistance Details: Pt ambulated to end of the bed and PT tried to get pt to go back to chair.  Pt resisted and PT and daughter realized that pt needed to go to the bathroom so assisted pt to the bathroom.  Pt seemed to not know what to do with the tissue but she finally used the tissue.  however, did clean further with a washcloth as pt not clean.  Pt then ambulated back to the chair.  Pt resisted again when got back to chair and needed cues and facilitation to go and get into the chair.   Gait Pattern: Step-through pattern;Decreased stride length Gait velocity: impulsive at times Stairs: No Wheelchair Mobility Wheelchair Mobility: No     PT Goals (current goals can now be found in the care plan section)    Visit Information  Last PT Received On: 05/05/13 Assistance Needed: +2 History of Present Illness: 77 year old female with end stage dementia, renal artery stenosis, hyperlipidemia, gastro-esophageal reflux admitted 05/01/2013 with potential hospital-acquired pneumonia and? Endometritis.  She eventually had urine that was positive for Legionella and treatment was transitioned to Levaquin IV. Initially very tachycardic, anxious.      Subjective Data  Subjective: Pt with dementia   Cognition  Cognition Arousal/Alertness: Awake/alert Behavior During Therapy: Restless Overall Cognitive Status: History of cognitive impairments - at baseline Area of Impairment: Safety/judgement General Comments: Pt difficult to redirect.  Pt will desire to do something but cannot tell you secondary to dementia and aphasia and  this causes her to be unsafe at times.      Balance  Static Sitting Balance Static Sitting - Balance Support: Feet supported Static Sitting - Level of Assistance: 5: Stand by assistance Static Sitting - Comment/# of Minutes: 2  End of Session PT - End of  Session Equipment Utilized During Treatment: Gait belt Activity Tolerance: Other (comment) (limited due to cognition) Patient left: in chair;with call bell/phone within reach Nurse Communication: Mobility status       INGOLD,Lamberto Dinapoli 05/05/2013, 4:50 PM [facility] Acute Rehabilitation [phone] (pager)

## 2013-05-05 NOTE — Progress Notes (Signed)
Sabrina Long Pod discharged Home with husband per MD order.  Discharge instructions reviewed and discussed with the patient husband & daughter, all questions and concerns answered. Copy of instructions and scripts given to patient.    Medication List         alendronate 70 MG tablet  Commonly known as:  FOSAMAX  Take 70 mg by mouth every 7 (seven) days. Take with a full glass of water on an empty stomach.     ALPRAZolam 0.5 MG tablet  Commonly known as:  XANAX  Take 0.5 mg by mouth at bedtime as needed for sleep. Take one tablet 3 times a day for anxiety     CENTRUM SILVER PO  Take by mouth. Take one tablet once a day     CRESTOR 10 MG tablet  Generic drug:  rosuvastatin     hydrALAZINE 50 MG tablet  Commonly known as:  APRESOLINE  Take 50 mg by mouth 3 (three) times daily.     levofloxacin 750 MG tablet  Commonly known as:  LEVAQUIN  Take 1 tablet (750 mg total) by mouth daily.     losartan 25 MG tablet  Commonly known as:  COZAAR  Take 25 mg by mouth daily. Take one tablet once a day for blood pressure     memantine 10 MG tablet  Commonly known as:  NAMENDA  Take 10 mg by mouth 2 (two) times daily. Take one tablet twice a day for memory loss     metoprolol tartrate 12.5 mg Tabs  Commonly known as:  LOPRESSOR  Take 0.5 tablets (12.5 mg total) by mouth 2 (two) times daily.     nitroGLYCERIN 0.4 MG SL tablet  Commonly known as:  NITROSTAT  Place 0.4 mg under the tongue every 5 (five) minutes as needed for chest pain. Take one tablet sublingual every 5 minutes not to exceed three tablets for chest pain     omeprazole 20 MG capsule  Commonly known as:  PRILOSEC  TAKE 1 CAPSULE ONCE DAILY TO REDUCE STOMACH ACID AND TO HELP PROTECT ESOPHAGUS     sertraline 100 MG tablet  Commonly known as:  ZOLOFT  Take 100 mg by mouth daily. Take one tablet once a day        Patients skin is clean, dry and intact, no evidence of skin break down. IV site discontinued and  catheter remains intact. Site without signs and symptoms of complications. Dressing and pressure applied.  Patient escorted to car by NT in a wheelchair,  no distress noted upon discharge.  Sabrina Long 05/05/2013 6:18 PM

## 2013-05-05 NOTE — Progress Notes (Signed)
05/05/13 1521  PT Visit Information  Last PT Received On 05/05/13  Assistance Needed +1  History of Present Illness 77 year old female with end stage dementia, renal artery stenosis, hyperlipidemia, gastro-esophageal reflux admitted 05/01/2013 with potential hospital-acquired pneumonia and? Endometritis.  She eventually had urine that was positive for Legionella and treatment was transitioned to Levaquin IV. Initially very tachycardic, anxious.    PT Time Calculation  PT Start Time 1521  PT Stop Time 1530  PT Time Calculation (min) 9 min  Precautions  Precautions Fall  Restrictions  Weight Bearing Restrictions No  Cognition  Arousal/Alertness Awake/alert  Behavior During Therapy Restless  Overall Cognitive Status History of cognitive impairments - at baseline  Bed Mobility  Bed Mobility Not assessed  Transfers  Transfers Sit to Stand;Stand to Sit;Stand Pivot Transfers  Sit to Stand 4: Min assist;With upper extremity assist;From chair/3-in-1;With armrests  Stand to Sit 4: Min assist;With upper extremity assist;To chair/3-in-1;With armrests  Details for Transfer Assistance Ruth, OT was in room with pt and husband and finishing up her session.  This PT had been asked to come back and see pt when husband was here.  Observed husband asssiting pt with gait belt and noted good technique.  Husband also has difficulty instructing pt to get her to do what he wants and she will also resist him at times.  The famuly states that this happened at home as well.  They feel comfortable taking her home with the [facility] arranged.  Recommend gait belt and the husband and daughter agree.  Demonstrated use of gait belt.  Daughter to go get one in the gift shop.    Ambulation/Gait  Ambulation/Gait Assistance Not tested (comment)  Stairs No  Wheelchair Mobility  Wheelchair Mobility No  PT - End of Session  Equipment Utilized During Treatment Gait belt  Activity Tolerance Patient tolerated treatment well   Patient left in chair;with call bell/phone within reach  Nurse Communication Mobility status  PT - Assessment/Plan  PT Plan Current plan remains appropriate  PT Frequency Min 3X/week  Follow Up Recommendations Home health PT;Supervision/Assistance - 24 hour  PT equipment Other (comment) (gait belt)  PT Goal Progression  Progress towards PT goals Progressing toward goals  PT General Charges  $$ ACUTE PT VISIT 1 Procedure  PT Treatments  $Self Care/Home Management 8-22  [facility] Acute Rehabilitation [phone] (pager)

## 2013-05-05 NOTE — Evaluation (Signed)
Occupational Therapy Evaluation Patient Details Name: Sabrina Long MRN: [id number] DOB: 10/02/1936 Today's Date: 05/05/2013 Time: 8469-6295 OT Time Calculation (min): 26 min  OT Assessment / Plan / Recommendation History of present illness 78 year old female with end stage dementia, renal artery stenosis, hyperlipidemia, gastro-esophageal reflux admitted 05/01/2013 with potential hospital-acquired pneumonia and? Endometritis.  She eventually had urine that was positive for Legionella and treatment was transitioned to Levaquin IV. Initially very tachycardic, anxious.     Clinical Impression   Pt admitted with above. Eval and education completed, no further OT needs, will sign off.      OT Assessment  Patient does not need any further OT services (suspect in her own environment she will get back to her routine quite quickly)    Follow Up Recommendations  No OT follow up       Equipment Recommendations  None recommended by OT          Precautions / Restrictions Precautions Precautions: Fall Restrictions Weight Bearing Restrictions: No       ADL  Eating/Feeding: Simulated;Set up;Supervision/safety Where Assessed - Eating/Feeding: Chair Grooming: Performed;Minimal assistance;Wash/dry hands Where Assessed - Grooming: Unsupported standing Upper Body Bathing: Set up;Supervision/safety Where Assessed - Upper Body Bathing: Unsupported sitting Lower Body Bathing: Min guard Where Assessed - Lower Body Bathing: Supported sit to stand Upper Body Dressing: Moderate assistance Where Assessed - Upper Body Dressing: Unsupported sitting Lower Body Dressing: Simulated;Moderate assistance Where Assessed - Lower Body Dressing: Supported sit to Pharmacist, hospital: Performed;Minimal Web designer: Regular height toilet;Grab bars Toileting - Architect and Hygiene: Performed;Minimal assistance Where Assessed - Engineer, mining and  Hygiene: Standing Equipment Used: Gait belt Transfers/Ambulation Related to ADLs: Min A HHA, min A for sit<>stand ADL Comments: Advised husband and daughter that they should consider a gait belt for ambulation safety at home and provided them with where to get one.        Visit Information  Last OT Received On: 05/05/13 Assistance Needed: +1 History of Present Illness: 77 year old female with end stage dementia, renal artery stenosis, hyperlipidemia, gastro-esophageal reflux admitted 05/01/2013 with potential hospital-acquired pneumonia and? Endometritis.  She eventually had urine that was positive for Legionella and treatment was transitioned to Levaquin IV. Initially very tachycardic, anxious.         Prior Functioning     Home Living Family/patient expects to be discharged to:: Private residence Living Arrangements: Spouse/significant other Available Help at Discharge: Family;Available 24 hours/day Type of Home: House Home Equipment: Shower seat;Bedside commode Prior Function Level of Independence: Needs assistance Gait / Transfers Assistance Needed: Min A at worst (HHA) ADL's / Homemaking Assistance Needed: Needs A for getting in shower and S while showering (usually showers standing up--they do have a seat but she does not use), S for dressing and tolieting; does not do any of the IADLs Communication / Swallowing Assistance Needed: Limited communication pta Communication Communication: Receptive difficulties;Expressive difficulties         Vision/Perception Vision - History Patient Visual Report: No change from baseline   Cognition  Cognition Arousal/Alertness: Awake/alert Behavior During Therapy: Restless Overall Cognitive Status: History of cognitive impairments - at baseline Area of Impairment: Safety/judgement General Comments: Pt did not want to sit down when we got back to her room, her husband finally presuaded her to do so. Per daughter's report she does  this at home also/    Extremity/Trunk Assessment Upper Extremity Assessment Upper Extremity Assessment: Overall WFL for tasks assessed  Mobility Bed Mobility Details for Bed Mobility Assistance: Pt up in recliner upon arrival Transfers Transfers: Sit to Stand;Stand to Sit Sit to Stand: 4: Min assist;With upper extremity assist;With armrests;From chair/3-in-1 Stand to Sit: 4: Min assist;With upper extremity assist;To chair/3-in-1 (controlled descent without use of arms) Details for Transfer Assistance: Husband ambulated with her down the hall and back with min (HHA).           End of Session OT - End of Session Equipment Utilized During Treatment: Gait belt Activity Tolerance: Patient tolerated treatment well Patient left: in chair;with family/visitor present       Evette Georges [phone] 05/05/2013, 3:42 PM

## 2013-05-05 NOTE — Discharge Summary (Addendum)
Physician Discharge Summary  Sabrina Long ZOX:[id number] DOB: 1936-05-19 DOA: 05/01/2013  PCP: Bufford Spikes, DO  Admit date: 05/01/2013 Discharge date: 05/05/2013  Time spent: 35 minutes  Recommendations for Outpatient Follow-up:  1. Complete course of antibiotics 05/05/2013 2. Patient to get home health physical therapy occupational therapy home health aide and a hospital bed has been ordered for patient 3. Recommend basal metabolic panel and CBC in about a week 4. High threshold to imaging or further workup? Endometritis 5. High threshold given severe dementia and other issues to work up elevated BNP of 440, potentially can get outpatient echocardiogram    Discharge Diagnoses:  Principal Problem:   Legionella pneumonia Active Problems:   Sepsis   Acute endometritis   Altered mental status   Dementia   Discharge Condition: Stable  Diet recommendation: Regular diet  Filed Weights   05/02/13 1800 05/03/13 0412 05/05/13 0557  Weight: 69.4 kg (153 lb) 70 kg (154 lb 5.2 oz) 69.7 kg (153 lb 10.6 oz)    History of present illness:  77 year old female with end stage dementia, renal artery stenosis, hyperlipidemia, gastro-esophageal reflux admitted 05/01/2013 with potential hospital-acquired pneumonia and? Endometritis. She eventually had urine that was positive for Legionella and treatment was transitioned to Levaquin IV. Initially very tachycardic, anxious and fulfilled with sepsis 13 therefore was on the step down unit but was transitioned to regular floor on 05/04/2013.   Hospital Course:   1. SIRS-moderate-Likely Legionella pneumonia- Azithro/Rocephin transitioned to Levaquin. White count stable 19>11.6>>9 2 view x-ray redemonstrated pneumonia. Discontinue IV fluids-transitioned to oral Levaquin 7/13. Did discuss the case with Dr. Ilsa Iha of infectious disease who recommended total of 5 days antibiotics for community-acquired pneumonia, recommended cleansing of watercooler  patient may have drunk from. 2. Abdominal pain ?Endometritis-very unlikely. Not having any sexual contact currently and lives at home with husband. D/c flagyl 7/11. Tolerating by mouth well. Suspect this is related to # 6 3. Probable compnesated CHF-BNp. was 440-fluids-increased from 50 cc-->100cc/hour 7/11-now discontinued. High threshold workup  will defer to PCP 4. Sinus Tachycardia-has not received her regular medicines. Will re-implement. Could be 2/2 to #1. Could also be anxiety. Was started on beta blocker 12.5 metoprolol twice a day.  5. Hypertension-continue losartan 25 mg daily, thousand 50 3 times a day, metoprolol 25 twice a day [started for sinus tachycardia] 6. Anxiety-continue Xanax 0.5 q pm prn 7. End-stage dementia without behavioral disturbances-continue Namenda 10 bid, Sertraline 100 daily 8. HLd-? Mortality benefit Lipitor 20 daily   Consultants:  None currently Procedures:  Chest x-ray two-view 05/01/2013 = mild edema/CHF, bibasilar atelectasis  CT abdomen 05/01/2013% patchy bilateral nodular opacities lower lung concern for pneumonia, small amount of gas and fluid in the uterus,? Endometrial Antibiotics:  Azithromycin 05/01/2013  Ceftriaxone 05/01/13  Flagyl 05/01/2013  levaquin 7/11>>7/15 stop date  Discharge Exam: Filed Vitals:   05/04/13 1346 05/04/13 2117 05/05/13 0552 05/05/13 0557  BP: 159/85 185/85 185/82   Pulse: 82 82 85   Temp: 98.9 F (37.2 C) 98.2 F (36.8 C) 99.2 F (37.3 C)   TempSrc: Oral Oral Oral   Resp: 18 18 20    Height:      Weight:    69.7 kg (153 lb 10.6 oz)  SpO2: 97% 98% 99%    Pleasantly confused doing well no apparent distress tolerating diet is in soft restraints because of her tendency to pull out IV and pulling at lines   General: Alert but disoriented  Cardiovascular: S1-S2 sinus rhythm  Respiratory: Clinically  clear  Discharge Instructions  Discharge Orders   Future Appointments Provider Department Dept Phone   05/12/2013  3:15 PM Orlena Sheldon [facility] [phone]   07/17/2013 10:00 AM [name] Alroy Dust, DO [facility] [phone]   Future Orders Complete By Expires     Call MD for:  difficulty breathing, headache or visual disturbances  As directed     Call MD for:  persistant dizziness or light-headedness  As directed     Call MD for:  persistant nausea and vomiting  As directed     Call MD for:  redness, tenderness, or signs of infection (pain, swelling, redness, odor or green/yellow discharge around incision site)  As directed     Call MD for:  temperature >100.4  As directed     Diet - low sodium heart healthy  As directed     Discharge instructions  As directed     Comments:      Complete Levaquin for 1 more day Please check the water-cooler at home and have it serviced We will arrange home health for you    Increase activity slowly  As directed         Medication List         alendronate 70 MG tablet  Commonly known as:  FOSAMAX  Take 70 mg by mouth every 7 (seven) days. Take with a full glass of water on an empty stomach.     ALPRAZolam 0.5 MG tablet  Commonly known as:  XANAX  Take 0.5 mg by mouth at bedtime as needed for sleep. Take one tablet 3 times a day for anxiety     CENTRUM SILVER PO  Take by mouth. Take one tablet once a day     CRESTOR 10 MG tablet  Generic drug:  rosuvastatin     hydrALAZINE 50 MG tablet  Commonly known as:  APRESOLINE  Take 50 mg by mouth 3 (three) times daily.     levofloxacin 750 MG tablet  Commonly known as:  LEVAQUIN  Take 1 tablet (750 mg total) by mouth daily.     losartan 25 MG tablet  Commonly known as:  COZAAR  Take 25 mg by mouth daily. Take one tablet once a day for blood pressure     memantine 10 MG tablet  Commonly known as:  NAMENDA  Take 10 mg by mouth 2 (two) times daily. Take one tablet twice a day for memory loss     metoprolol tartrate 12.5 mg Tabs  Commonly known as:  LOPRESSOR  Take 0.5 tablets (12.5  mg total) by mouth 2 (two) times daily.     nitroGLYCERIN 0.4 MG SL tablet  Commonly known as:  NITROSTAT  Place 0.4 mg under the tongue every 5 (five) minutes as needed for chest pain. Take one tablet sublingual every 5 minutes not to exceed three tablets for chest pain     omeprazole 20 MG capsule  Commonly known as:  PRILOSEC  TAKE 1 CAPSULE ONCE DAILY TO REDUCE STOMACH ACID AND TO HELP PROTECT ESOPHAGUS     sertraline 100 MG tablet  Commonly known as:  ZOLOFT  Take 100 mg by mouth daily. Take one tablet once a day       No Known Allergies    The results of significant diagnostics from this hospitalization (including imaging, microbiology, ancillary and laboratory) are listed below for reference.    Significant Diagnostic Studies: Dg Chest 2 View  05/03/2013   *  RADIOLOGY REPORT*  Clinical Data: Pneumonia.  CHEST - 2 VIEW  Comparison: 05/01/2013.  Findings: A poor inspiration is again demonstrated with no gross change in enlargement of the cardiac silhouette.  Increased patchy opacity throughout both lungs.  Mild thoracic spine degenerative changes.  IMPRESSION: Progressive bilateral pneumonia.   Original Report Authenticated By: Beckie Salts, M.D.   Dg Chest 2 View  05/01/2013   *RADIOLOGY REPORT*  Clinical Data: Shortness of breath.  CHEST - 2 VIEW  Comparison: None.  Findings: Mild cardiomegaly with vascular congestion.  Diffuse interstitial prominence may reflect interstitial edema.  Bibasilar atelectasis.  No effusions.  No acute bony abnormality.  IMPRESSION: Suspect mild edema/CHF.  Bibasilar atelectasis.   Original Report Authenticated By: Charlett Nose, M.D.   Ct Abdomen Pelvis W Contrast  05/01/2013   *RADIOLOGY REPORT*  Clinical Data: Shortness of breath, fever.  CT ABDOMEN AND PELVIS WITH CONTRAST  Technique:  Multidetector CT imaging of the abdomen and pelvis was performed following the standard protocol during bolus administration of intravenous contrast.  Contrast:  OMNIPAQUE IOHEXOL 300 MG/ML  SOLN  Comparison: None.  Findings: Somewhat nodular appearing airspace opacities are noted in the visualized lower lung fields bilaterally.  I am concerned this likely represents pneumonia.  No effusions.  Heart is borderline in size.  Liver, gallbladder, spleen, pancreas, adrenals and right kidney are unremarkable.  Small cysts in the left kidney.  No hydronephrosis.  Moderate stool throughout the colon.  Small bowel is decompressed.  There is air centrally within the uterus, likely within the endometrial cavity.  If there has not been recent instrumentation, I cannot exclude infection/endometritis.  Calcified fibroids throughout the uterus.  No adnexal masses.  Urinary bladder is unremarkable.  Aorta is normal caliber.  IMPRESSION: Patchy bilateral somewhat nodular appearing opacities in the lower lungs concerning for pneumonia.  Small amount of gas centrally within the uterus, presumably within the endometrium.  If the patient has not had a recent instrumentation, this could represent infection/endometritis. Recommend clinical correlation.   Original Report Authenticated By: Charlett Nose, M.D.    Microbiology: Recent Results (from the past 240 hour(s))  CULTURE, BLOOD (ROUTINE X 2)     Status: None   Collection Time    05/02/13 12:02 AM      Result Value Range Status   Specimen Description BLOOD RIGHT ARM   Final   Special Requests BOTTLES DRAWN AEROBIC AND ANAEROBIC 10CC   Final   Culture  Setup Time 05/02/2013 05:00   Final   Culture     Final   Value:        BLOOD CULTURE RECEIVED NO GROWTH TO DATE CULTURE WILL BE HELD FOR 5 DAYS BEFORE ISSUING A FINAL NEGATIVE REPORT   Report Status PENDING   Incomplete  CULTURE, BLOOD (ROUTINE X 2)     Status: None   Collection Time    05/02/13 12:12 AM      Result Value Range Status   Specimen Description BLOOD LEFT HAND   Final   Special Requests BOTTLES DRAWN AEROBIC ONLY 7CC   Final   Culture  Setup Time 05/02/2013 05:00    Final   Culture     Final   Value:        BLOOD CULTURE RECEIVED NO GROWTH TO DATE CULTURE WILL BE HELD FOR 5 DAYS BEFORE ISSUING A FINAL NEGATIVE REPORT   Report Status PENDING   Incomplete  MRSA PCR SCREENING     Status: Abnormal  Collection Time    05/02/13 12:50 AM      Result Value Range Status   MRSA by PCR POSITIVE (*) NEGATIVE Final   Comment:            The GeneXpert MRSA Assay (FDA     approved for NASAL specimens     only), is one component of a     comprehensive MRSA colonization     surveillance program. It is not     intended to diagnose MRSA     infection nor to guide or     monitor treatment for     MRSA infections.     RESULT CALLED TO, READ BACK BY AND VERIFIED WITH:     CALLED TO RN Nicolette Bang [phone] [id number] @0427  THANEY  URINE CULTURE     Status: None   Collection Time    05/02/13  5:50 PM      Result Value Range Status   Specimen Description URINE, CATHETERIZED   Final   Special Requests NONE   Final   Culture  Setup Time 05/02/2013 23:54   Final   Colony Count NO GROWTH   Final   Culture NO GROWTH   Final   Report Status 05/03/2013 FINAL   Final     Labs: Basic Metabolic Panel:  Recent Labs Lab 05/01/13 1827 05/02/13 1045 05/03/13 1200 05/04/13 0345  NA 139 137 141 140  K 3.4* 4.9 3.6 3.3*  CL 105 103 109 110  CO2 24 23 23 24   GLUCOSE 134* 155* 128* 112*  BUN 14 11 11 9   CREATININE 0.89 0.74 0.68 0.63  CALCIUM 9.7 9.8 9.4 9.2   Liver Function Tests:  Recent Labs Lab 05/01/13 1827 05/03/13 1200 05/04/13 0345  AST 53* 44* 70*  ALT 52* 42* 56*  ALKPHOS 80 71 86  BILITOT 0.5 0.3 0.3  PROT 8.1 7.1 6.7  ALBUMIN 3.1* 2.5* 2.4*    Recent Labs Lab 05/01/13 1827  LIPASE 19   No results found for this basename: AMMONIA,  in the last 168 hours CBC:  Recent Labs Lab 05/01/13 1827 05/02/13 1045 05/03/13 1200 05/04/13 0345  WBC 20.7* 19.9* 11.6* 9.1  NEUTROABS 17.4*  --   --  6.6  HGB 12.0 12.8 11.1* 10.2*  HCT 35.3* 37.6 33.3* 31.2*   MCV 95.1 95.9 94.9 95.1  PLT 192 188 212 221   Cardiac Enzymes: No results found for this basename: CKTOTAL, CKMB, CKMBINDEX, TROPONINI,  in the last 168 hours BNP: BNP (last 3 results)  Recent Labs  05/03/13 0500  PROBNP 440.8*   CBG: No results found for this basename: GLUCAP,  in the last 168 hours     Signed:  Rhetta Mura  [facility] 05/05/2013, 10:09 AM

## 2013-05-05 NOTE — Care Management Note (Signed)
    Page 1 of 2   05/05/2013     2:54:11 PM   CARE MANAGEMENT NOTE 05/05/2013  Patient:  Sabrina Long, Sabrina Long   Account Number:  [id number]  Date Initiated:  05/05/2013  Documentation initiated by:  Letha Cape  Subjective/Objective Assessment:   dx sepsis  admit- lives with spouse.  pt's daughter Veatrice Bourbon phone is [phone]     Action/Plan:   pt eval- spouse if refusing snf, wants hh.   Anticipated DC Date:  05/05/2013   Anticipated DC Plan:  HOME W HOME HEALTH SERVICES      DC Planning Services  CM consult      [facility] Choice  HOME HEALTH   Choice offered to / List presented to:  C-4 Adult Children        HH arranged  HH-1 RN  HH-2 PT  HH-4 NURSE'S AIDE  HH-6 SOCIAL WORKER      HH agency  [facility]   Status of service:  Completed, signed off Medicare Important Message given?   (If response is "NO", the following Medicare IM given date fields will be blank) Date Medicare IM given:   Date Additional Medicare IM given:    Discharge Disposition:  HOME W HOME HEALTH SERVICES  Per UR Regulation:  Reviewed for med. necessity/level of care/duration of stay  If discussed at Long Length of Stay Meetings, dates discussed:    Comments:  05/05/13 14:46 Letha Cape RN, BSN [phone] Patient is for dc today, home with home health, spouse is refusing to go to snf per daughter.  Referral sent to [facility] in [facility]  [phone] , fax [phone] for [facility], PT, aide and Child psychotherapist.  Rep at [facility] states that a RN will not be able to see patient until Wed, she wanted to know if that was ok.  I checked with the daughter in the room she states that will be fine because she will be with patient this evenging with her dad and her other sister will be with her on Tuesday.  Soc will begin on Wed post discharge.

## 2013-05-07 DIAGNOSIS — M6281 Muscle weakness (generalized): Secondary | ICD-10-CM

## 2013-05-07 DIAGNOSIS — IMO0001 Reserved for inherently not codable concepts without codable children: Secondary | ICD-10-CM

## 2013-05-07 DIAGNOSIS — F0280 Dementia in other diseases classified elsewhere without behavioral disturbance: Secondary | ICD-10-CM

## 2013-05-07 DIAGNOSIS — G309 Alzheimer's disease, unspecified: Secondary | ICD-10-CM

## 2013-05-08 LAB — CULTURE, BLOOD (ROUTINE X 2): Culture: NO GROWTH

## 2013-05-12 ENCOUNTER — Encounter: Payer: Self-pay | Admitting: Internal Medicine

## 2013-05-12 ENCOUNTER — Ambulatory Visit (INDEPENDENT_AMBULATORY_CARE_PROVIDER_SITE_OTHER): Payer: Medicare Other | Admitting: Internal Medicine

## 2013-05-12 VITALS — BP 144/78 | HR 68 | Temp 98.1°F | Resp 14 | Wt 150.4 lb

## 2013-05-12 DIAGNOSIS — A481 Legionnaires' disease: Secondary | ICD-10-CM

## 2013-05-12 DIAGNOSIS — G309 Alzheimer's disease, unspecified: Secondary | ICD-10-CM

## 2013-05-12 DIAGNOSIS — I701 Atherosclerosis of renal artery: Secondary | ICD-10-CM

## 2013-05-12 DIAGNOSIS — F0280 Dementia in other diseases classified elsewhere without behavioral disturbance: Secondary | ICD-10-CM

## 2013-05-12 NOTE — Progress Notes (Signed)
Patient ID: Sabrina Long, female   DOB: 1936/08/26, 77 y.o.   MRN: [id number] Location:  [facility]  Code Status: full code  No Known Allergies  Chief Complaint  Patient presents with  . Hospitalization Follow-up    HPI: Patient is a 77 y.o. black female seen in the office today for hospitalization f/u.  She is doing wonderfully today.  Her daughter says she is acting like her mom.    Pt and her husband got Legionnaire's disease from hotel in [place].  Got violently ill 2 days after they returned.  Pt was crouching and grabbing her stomach.  Had fever, didn't look good.  Went to urgent care in [place]--had pneumonia.  Was in [place] 7/10-14.  Now breathing fine.  No shortness of breath now.  Not coughing anymore.  No pain.    Review of Systems:  Review of Systems  Constitutional: Negative for fever, chills and malaise/fatigue.  HENT: Negative for congestion.   Respiratory: Negative for shortness of breath.   Cardiovascular: Negative for chest pain.  Gastrointestinal: Negative for nausea, vomiting, abdominal pain and diarrhea.  Genitourinary: Negative for dysuria.  Musculoskeletal: Negative for back pain.  Neurological: Positive for weakness. Negative for dizziness and focal weakness.  Psychiatric/Behavioral: Positive for memory loss.       Family has plans for more trips this summer and pt needs someone to look after her while they are away    Past Medical History  Diagnosis Date  . Unspecified constipation   . Anxiety state, unspecified   . Colitis, enteritis, and gastroenteritis of presumed infectious origin 11/02/2011  . Pneumonia, organism unspecified 11/02/2011  . Cystitis, unspecified   . Impetigo   . Renal artery stenosis   . Dementia in conditions classified elsewhere with behavioral disturbance(294.11)   . Hypercalcemia   . Reflux esophagitis   . Chest pain, unspecified   . Abnormality of gait   . Hyperosmolality  and/or hypernatremia   . Unspecified disorder of liver   . Candidiasis of vulva and vagina   . Alzheimer's disease   . Chest pain, unspecified 08/19/2008  . Dementia in conditions classified elsewhere without behavioral disturbance(294.10)   . Depressive disorder, not elsewhere classified   . Esophageal reflux   . Other and unspecified hyperlipidemia   . Loss of weight   . Unspecified persistent mental disorders due to conditions classified elsewhere   . Reactive confusion   . Unspecified essential hypertension   . Other seborrheic keratosis   . Osteoarthrosis, unspecified whether generalized or localized, unspecified site   . Stiffness of joint, not elsewhere classified, unspecified site   . Memory loss 07/24/2007  . Headache(784.0)   . Undiagnosed cardiac murmurs     Past Surgical History  Procedure Laterality Date  . Cesarean section      Social History:   reports that she has never smoked. She does not have any smokeless tobacco history on file. Her alcohol and drug histories are not on file.  No family history on file.  Medications: Patient's Medications  New Prescriptions   No medications on file  Previous Medications   ALENDRONATE (FOSAMAX) 70 MG TABLET    Take 70 mg by mouth every 7 (seven) days. Take with a full glass of water on an empty stomach.   ALPRAZOLAM (XANAX) 0.5 MG TABLET    Take 0.5 mg by mouth at bedtime as needed for sleep. Take one tablet 3 times a day for anxiety  HYDRALAZINE (APRESOLINE) 50 MG TABLET    Take 50 mg by mouth 3 (three) times daily.    LOSARTAN (COZAAR) 25 MG TABLET    Take 25 mg by mouth daily. Take one tablet once a day for blood pressure   MEMANTINE (NAMENDA) 10 MG TABLET    Take 10 mg by mouth 2 (two) times daily. Take one tablet twice a day for memory loss   METOPROLOL TARTRATE (LOPRESSOR) 12.5 MG TABS    Take 0.5 tablets (12.5 mg total) by mouth 2 (two) times daily.   MULTIPLE VITAMINS-MINERALS (CENTRUM SILVER PO)    Take by  mouth. Take one tablet once a day   NITROGLYCERIN (NITROSTAT) 0.4 MG SL TABLET    Place 0.4 mg under the tongue every 5 (five) minutes as needed for chest pain. Take one tablet sublingual every 5 minutes not to exceed three tablets for chest pain   OMEPRAZOLE (PRILOSEC) 20 MG CAPSULE    TAKE 1 CAPSULE ONCE DAILY TO REDUCE STOMACH ACID AND TO HELP PROTECT ESOPHAGUS   ROSUVASTATIN (CRESTOR) 10 MG TABLET       SERTRALINE (ZOLOFT) 100 MG TABLET    Take 100 mg by mouth daily. Take one tablet once a day  Modified Medications   No medications on file  Discontinued Medications   LEVOFLOXACIN (LEVAQUIN) 750 MG TABLET    Take 1 tablet (750 mg total) by mouth daily.     Physical Exam: Filed Vitals:   05/12/13 1511  BP: 144/78  Pulse: 68  Temp: 98.1 F (36.7 C)  TempSrc: Oral  Resp: 14  Weight: 150 lb 6.4 oz (68.221 kg)  Physical Exam  Constitutional: No distress.  Thin AA female, sits and laughs occasionally during visit, pleasant  HENT:  Head: Normocephalic and atraumatic.  Cardiovascular: Normal rate, regular rhythm, normal heart sounds and intact distal pulses.   Pulmonary/Chest: Effort normal and breath sounds normal.  Abdominal: Soft. Bowel sounds are normal. She exhibits no distension. There is no tenderness.  Musculoskeletal: Normal range of motion. She exhibits no edema and no tenderness.  Neurological: She is alert.  Oriented to person only  Skin: Skin is warm and dry.  Multiple seborrheic keratoses and nevi    Labs reviewed: Basic Metabolic Panel:  Recent Labs  16/10/96 1045 05/03/13 1200 05/04/13 0345  NA 137 141 140  K 4.9 3.6 3.3*  CL 103 109 110  CO2 23 23 24   GLUCOSE 155* 128* 112*  BUN 11 11 9   CREATININE 0.74 0.68 0.63  CALCIUM 9.8 9.4 9.2   Liver Function Tests:  Recent Labs  05/01/13 1827 05/03/13 1200 05/04/13 0345  AST 53* 44* 70*  ALT 52* 42* 56*  ALKPHOS 80 71 86  BILITOT 0.5 0.3 0.3  PROT 8.1 7.1 6.7  ALBUMIN 3.1* 2.5* 2.4*    Recent  Labs  05/01/13 1827  LIPASE 19  CBC:  Recent Labs  02/26/13 1202  05/01/13 1827 05/02/13 1045 05/03/13 1200 05/04/13 0345  WBC 5.5  < > 20.7* 19.9* 11.6* 9.1  NEUTROABS 2.4  --  17.4*  --   --  6.6  HGB 12.6  --  12.0 12.8 11.1* 10.2*  HCT 39.3  --  35.3* 37.6 33.3* 31.2*  MCV 100*  --  95.1 95.9 94.9 95.1  PLT  --   < > 192 188 212 221  < > = values in this interval not displayed. Lipid Panel:  Recent Labs  03/20/13 1143  HDL 70  LDLCALC  97  TRIG 62  CHOLHDL 2.6   Assessment/Plan 1. Alzheimer's disease--more likely vascular dementia - Ambulatory referral to Home Health primarily for aide services at this point   2. Renal artery stenosis --with difficult to control blood pressure --at goal considering her comorbidities at this time (<150/90 due to age)  3. Legionnaire's disease--from hotel stay in [place] --has completed abx and is doing very well  Labs/tests ordered:  Home health pt, ot (home safety), rn, aide services (assist with pills, meals, company while family including husband on vacations) Next appt:  Keep appt in September as scheduled

## 2013-07-17 ENCOUNTER — Ambulatory Visit: Payer: Medicare Other | Admitting: Internal Medicine

## 2013-08-21 ENCOUNTER — Telehealth: Payer: Self-pay

## 2013-08-21 ENCOUNTER — Ambulatory Visit: Payer: Medicare Other | Admitting: Internal Medicine

## 2013-08-21 NOTE — Telephone Encounter (Signed)
Message left on triage voicemail indicating patient with no skilled needs and no physical therapy services needed. [facility] will refer patient to someone else for personal care.   Dr.Reed was verbally informed

## 2013-09-03 ENCOUNTER — Other Ambulatory Visit: Payer: Self-pay | Admitting: *Deleted

## 2013-09-03 ENCOUNTER — Other Ambulatory Visit (HOSPITAL_COMMUNITY): Payer: Self-pay | Admitting: Internal Medicine

## 2013-09-03 ENCOUNTER — Other Ambulatory Visit: Payer: Self-pay | Admitting: Internal Medicine

## 2013-09-29 ENCOUNTER — Ambulatory Visit: Payer: Medicare Other | Admitting: Internal Medicine

## 2013-10-14 ENCOUNTER — Other Ambulatory Visit: Payer: Self-pay | Admitting: *Deleted

## 2013-10-14 MED ORDER — ALENDRONATE SODIUM 70 MG PO TABS: 70.0000 mg | ORAL_TABLET | ORAL | Status: DC

## 2013-10-14 MED ORDER — MEMANTINE HCL 10 MG PO TABS: ORAL_TABLET | ORAL | Status: DC

## 2013-10-28 ENCOUNTER — Other Ambulatory Visit: Payer: Self-pay | Admitting: *Deleted

## 2013-10-28 MED ORDER — ALENDRONATE SODIUM 70 MG PO TABS: 70.0000 mg | ORAL_TABLET | ORAL | Status: DC

## 2013-10-28 MED ORDER — METOPROLOL TARTRATE 25 MG PO TABS: ORAL_TABLET | ORAL | Status: DC

## 2013-10-28 MED ORDER — MEMANTINE HCL 10 MG PO TABS: ORAL_TABLET | ORAL | Status: DC

## 2013-10-31 ENCOUNTER — Other Ambulatory Visit: Payer: Self-pay | Admitting: Internal Medicine

## 2013-10-31 ENCOUNTER — Telehealth: Payer: Self-pay | Admitting: *Deleted

## 2013-10-31 MED ORDER — ALPRAZOLAM 0.5 MG PO TABS: 0.5000 mg | ORAL_TABLET | Freq: Every evening | ORAL | Status: DC | PRN

## 2013-10-31 NOTE — Telephone Encounter (Signed)
Pt has not been taking Xanax 0.5 mg in a while.  They have none of the old RX left.  Can a RX be sent to the pharmacy? Please advise.

## 2013-10-31 NOTE — Telephone Encounter (Signed)
She should have xanax 0.5mg  which has previously been prescribed at bedtime.  Are they using that already?  If not, I would suggest using that at bedtime and for the trip, but to be very careful that she does not fall while taking it b/c it is sedating.

## 2013-10-31 NOTE — Telephone Encounter (Signed)
Pt is in [place] with her son and since going there has started wondering at night. Would like to have something to help her sleep as well as something to help with the plane ride home.  rx will need [facility] [place] @[phone]. Please advise thanks

## 2013-10-31 NOTE — Telephone Encounter (Signed)
Per dr Renato Gailseed, RX for Xanax printed and will be called into the [facility] [place] @ # below and pt's daughter notified VIA phone.

## 2013-11-07 ENCOUNTER — Encounter: Payer: Self-pay | Admitting: *Deleted

## 2013-11-07 ENCOUNTER — Other Ambulatory Visit (HOSPITAL_COMMUNITY): Payer: Self-pay | Admitting: Internal Medicine

## 2013-11-28 ENCOUNTER — Other Ambulatory Visit: Payer: Self-pay | Admitting: Internal Medicine

## 2013-12-11 ENCOUNTER — Other Ambulatory Visit: Payer: Self-pay | Admitting: Internal Medicine

## 2013-12-19 ENCOUNTER — Other Ambulatory Visit: Payer: Self-pay | Admitting: *Deleted

## 2013-12-19 ENCOUNTER — Encounter: Payer: Self-pay | Admitting: *Deleted

## 2013-12-19 MED ORDER — ALPRAZOLAM 0.5 MG PO TABS: 0.5000 mg | ORAL_TABLET | Freq: Every evening | ORAL | Status: DC | PRN

## 2013-12-19 MED ORDER — METOPROLOL TARTRATE 25 MG PO TABS: ORAL_TABLET | ORAL | Status: DC

## 2013-12-22 ENCOUNTER — Ambulatory Visit (INDEPENDENT_AMBULATORY_CARE_PROVIDER_SITE_OTHER): Payer: Medicare Other | Admitting: Internal Medicine

## 2013-12-22 ENCOUNTER — Encounter: Payer: Self-pay | Admitting: Internal Medicine

## 2013-12-22 VITALS — BP 120/84 | HR 87 | Wt 147.2 lb

## 2013-12-22 DIAGNOSIS — M1711 Unilateral primary osteoarthritis, right knee: Secondary | ICD-10-CM

## 2013-12-22 DIAGNOSIS — K219 Gastro-esophageal reflux disease without esophagitis: Secondary | ICD-10-CM

## 2013-12-22 DIAGNOSIS — F0280 Dementia in other diseases classified elsewhere without behavioral disturbance: Secondary | ICD-10-CM

## 2013-12-22 DIAGNOSIS — I10 Essential (primary) hypertension: Secondary | ICD-10-CM

## 2013-12-22 DIAGNOSIS — E785 Hyperlipidemia, unspecified: Secondary | ICD-10-CM

## 2013-12-22 DIAGNOSIS — IMO0002 Reserved for concepts with insufficient information to code with codable children: Secondary | ICD-10-CM

## 2013-12-22 DIAGNOSIS — G309 Alzheimer's disease, unspecified: Secondary | ICD-10-CM

## 2013-12-22 DIAGNOSIS — M1710 Unilateral primary osteoarthritis, unspecified knee: Secondary | ICD-10-CM

## 2013-12-22 MED ORDER — METOPROLOL TARTRATE 25 MG PO TABS: ORAL_TABLET | ORAL | Status: DC

## 2013-12-22 MED ORDER — DICLOFENAC SODIUM 1 % TD GEL: 4.0000 g | Freq: Four times a day (QID) | TRANSDERMAL | Status: DC | PRN

## 2013-12-22 NOTE — Progress Notes (Signed)
Patient ID: Sabrina Long, female   DOB: [birth year], 78 y.o.   MRN: [id number]   Location:  [facility]  Code Status: full code   No Known Allergies  Chief Complaint  Patient presents with  . Medical Managment of Chronic Issues    4 month f/u w/no recent labs  . other    RT Hip/knee pain, has been using cream for relieve.    HPI: Patient is a 78 y.o. black female seen in the office today for medical mgt of chronic diseases.  Sabrina Long is staying with her son now so he is with her for the visit.  Right leg is painful.  Using muscle rub on it with benefit.    Medicines are going pretty well.  A little bit of a tussle in the mornings.  Takes meds if she is still in bed, then more likely to take a shower and get dressed with help.  Is feisty if she hasn't had the pills first.  No falls.  No difficulty getting around.  No diarrhea or bladder problems.    Review of Systems:  Review of Systems  Constitutional: Negative for fever, chills, weight loss and malaise/fatigue.  HENT: Negative for hearing loss.   Eyes: Negative for blurred vision.  Respiratory: Negative for cough and shortness of breath.   Cardiovascular: Negative for chest pain and leg swelling.  Gastrointestinal: Positive for heartburn. Negative for diarrhea, blood in stool and melena.  Genitourinary: Negative for dysuria, urgency and frequency.  Musculoskeletal: Negative for falls.  Skin: Negative for rash.  Neurological: Negative for dizziness, loss of consciousness and weakness.  Psychiatric/Behavioral: Positive for memory loss.     Past Medical History  Diagnosis Date  . Unspecified constipation   . Anxiety state, unspecified   . Colitis, enteritis, and gastroenteritis of presumed infectious origin 11/02/2011  . Pneumonia, organism unspecified 11/02/2011  . Cystitis, unspecified   . Impetigo   . Renal artery stenosis   . Dementia in conditions classified elsewhere  with behavioral disturbance   . Hypercalcemia   . Reflux esophagitis   . Chest pain, unspecified   . Abnormality of gait   . Hyperosmolality and/or hypernatremia   . Unspecified disorder of liver   . Candidiasis of vulva and vagina   . Alzheimer's disease   . Chest pain, unspecified 08/19/2008  . Dementia in conditions classified elsewhere without behavioral disturbance   . Depressive disorder, not elsewhere classified   . Esophageal reflux   . Other and unspecified hyperlipidemia   . Loss of weight   . Unspecified persistent mental disorders due to conditions classified elsewhere   . Reactive confusion   . Unspecified essential hypertension   . Other seborrheic keratosis   . Osteoarthrosis, unspecified whether generalized or localized, unspecified site   . Stiffness of joint, not elsewhere classified, unspecified site   . Memory loss 07/24/2007  . Headache(784.0)   . Undiagnosed cardiac murmurs     Past Surgical History  Procedure Laterality Date  . Cesarean section      Social History:   reports that she has never smoked. She does not have any smokeless tobacco history on file. Her alcohol and drug histories are not on file.  History reviewed. No pertinent family history.  Medications: Patient's Medications  New Prescriptions   No medications on file  Previous Medications   ALENDRONATE (FOSAMAX) 70 MG TABLET    Take 1 tablet (70 mg total) by mouth every  7 (seven) days. Take with a full glass of water on an empty stomach.   ALPRAZOLAM (XANAX) 0.5 MG TABLET    Take 1 tablet (0.5 mg total) by mouth at bedtime as needed for sleep. Take one tablet 3 times a day for anxiety   CRESTOR 10 MG TABLET    TAKE 1 TABLET ONCE DAILY FOR CHOLESTEROL   HYDRALAZINE (APRESOLINE) 50 MG TABLET    Take 50 mg by mouth 3 (three) times daily.    LOSARTAN (COZAAR) 25 MG TABLET    TAKE 1 TABLET DAILY FOR BLOOD PRESSURE   MEMANTINE (NAMENDA) 10 MG TABLET    Take one tablet twice a day for memory  loss   METOPROLOL TARTRATE (LOPRESSOR) 25 MG TABLET    TAKE 1/2 TABLET BY MOUTH TWICE A DAY   MULTIPLE VITAMINS-MINERALS (CENTRUM SILVER PO)    Take by mouth. Take one tablet once a day   NITROGLYCERIN (NITROSTAT) 0.4 MG SL TABLET    Place 0.4 mg under the tongue every 5 (five) minutes as needed for chest pain. Take one tablet sublingual every 5 minutes not to exceed three tablets for chest pain   OMEPRAZOLE (PRILOSEC) 20 MG CAPSULE    TAKE 1 CAPSULE DAILY TO REDUCE STOMACH ACID AND TO HELP PROTECT ESOPHAGUS   ROSUVASTATIN (CRESTOR) 10 MG TABLET       SERTRALINE (ZOLOFT) 100 MG TABLET    Take 100 mg by mouth daily. Take one tablet once a day  Modified Medications   No medications on file  Discontinued Medications   METOPROLOL TARTRATE (LOPRESSOR) 25 MG TABLET    Take 1/2 tablet by mouth twice daily     Physical Exam: Filed Vitals:   12/22/13 1315  BP: 120/84  Pulse: 87  Weight: 147 lb 3.2 oz (66.769 kg)  SpO2: 98%  Physical Exam  Constitutional: She appears well-developed and well-nourished. No distress.  HENT:  Head: Normocephalic and atraumatic.  Cardiovascular: Normal rate, regular rhythm, normal heart sounds and intact distal pulses.   Pulmonary/Chest: Effort normal and breath sounds normal. No respiratory distress.  Abdominal: Soft. Bowel sounds are normal. She exhibits no distension and no mass. There is no tenderness.  Neurological: She is alert.  Occasionally speaks a few coherent statements, but mostly nonverbal  Skin: Skin is warm and dry.  Psychiatric:  Tearful off and on, but smiling and interactive     Labs reviewed: Basic Metabolic Panel:  Recent Labs  16/10/96 1045 05/03/13 1200 05/04/13 0345  NA 137 141 140  K 4.9 3.6 3.3*  CL 103 109 110  CO2 23 23 24   GLUCOSE 155* 128* 112*  BUN 11 11 9   CREATININE 0.74 0.68 0.63  CALCIUM 9.8 9.4 9.2   Liver Function Tests:  Recent Labs  05/01/13 1827 05/03/13 1200 05/04/13 0345  AST 53* 44* 70*  ALT 52*  42* 56*  ALKPHOS 80 71 86  BILITOT 0.5 0.3 0.3  PROT 8.1 7.1 6.7  ALBUMIN 3.1* 2.5* 2.4*    Recent Labs  05/01/13 1827  LIPASE 19  CBC:  Recent Labs  02/26/13 1202  05/01/13 1827 05/02/13 1045 05/03/13 1200 05/04/13 0345  WBC 5.5  < > 20.7* 19.9* 11.6* 9.1  NEUTROABS 2.4  --  17.4*  --   --  6.6  HGB 12.6  --  12.0 12.8 11.1* 10.2*  HCT 39.3  --  35.3* 37.6 33.3* 31.2*  MCV 100*  --  95.1 95.9 94.9 95.1  PLT  --   < >  192 188 212 221  < > = values in this interval not displayed. Lipid Panel:  Recent Labs  03/20/13 1143  HDL 70  LDLCALC 97  TRIG 62  CHOLHDL 2.6   Assessment/Plan 1. Osteoarthritis of right knee -may use voltaren alternating with muscle rub for this pain and right hip pain - diclofenac sodium (VOLTAREN) 1 % GEL; Apply 4 g topically 4 (four) times daily as needed. For right knee pain  Dispense: 100 g; Refill: 3  2. Alzheimer's disease -is late stage, but is still ambulatory w/o falls--dependent on her son with whom she lives for ADLs -doing amazingly well considering her husband has gotten sick with ALS and is on ventilator  3. Unspecified essential hypertension -cont current regimen--well controlled - metoprolol tartrate (LOPRESSOR) 25 MG tablet; Take 1/2 tablet by mouth twice a day  Dispense: 90 tablet; Refill: 1  4. Hyperlipidemia LDL goal < 100 -lipids at goal with current meds, recheck next time  5. Esophageal reflux -cont PPI due to longstanding difficulty with this  Labs/tests ordered:   Orders Placed This Encounter  Procedures  . Comprehensive metabolic panel    Standing Status: Future     Number of Occurrences:      Standing Expiration Date: 04/23/2014  . CBC with Differential    Standing Status: Future     Number of Occurrences:      Standing Expiration Date: 04/23/2014  . Lipid panel    Standing Status: Future     Number of Occurrences:      Standing Expiration Date: 04/23/2014  . HM COLONOSCOPY    This external order was  created through the Results Console.   Next appt: 2 mos with labs day of visit

## 2014-01-09 ENCOUNTER — Other Ambulatory Visit: Payer: Self-pay | Admitting: *Deleted

## 2014-01-09 MED ORDER — HYDRALAZINE HCL 50 MG PO TABS: 50.0000 mg | ORAL_TABLET | Freq: Three times a day (TID) | ORAL | Status: DC

## 2014-01-12 ENCOUNTER — Other Ambulatory Visit: Payer: Self-pay | Admitting: *Deleted

## 2014-01-12 MED ORDER — HYDRALAZINE HCL 50 MG PO TABS: 50.0000 mg | ORAL_TABLET | Freq: Three times a day (TID) | ORAL | Status: DC

## 2014-01-20 ENCOUNTER — Telehealth: Payer: Self-pay | Admitting: *Deleted

## 2014-01-20 NOTE — Telephone Encounter (Signed)
Ms. Sabrina Long's son called and stated that his mother is very combative and restless. I wants to know what can be done, change in medication, what are there options. He states that she is very tired, and can't rest. He wants to know if he needs to bring her in for a visit. Please Advise.

## 2014-01-21 NOTE — Telephone Encounter (Signed)
Spoke with son and scheduled an appointment for 01/22/14 @ 12:00 noon with Shanda BumpsJessica.

## 2014-01-21 NOTE — Telephone Encounter (Signed)
Assuming I can speak tomorrow, pt can come in in the afternoon.

## 2014-01-22 ENCOUNTER — Ambulatory Visit (INDEPENDENT_AMBULATORY_CARE_PROVIDER_SITE_OTHER): Payer: Medicare Other | Admitting: Nurse Practitioner

## 2014-01-22 ENCOUNTER — Encounter: Payer: Self-pay | Admitting: Nurse Practitioner

## 2014-01-22 VITALS — BP 134/82 | HR 80 | Temp 97.6°F | Resp 14 | Wt 145.8 lb

## 2014-01-22 DIAGNOSIS — F03918 Unspecified dementia, unspecified severity, with other behavioral disturbance: Secondary | ICD-10-CM

## 2014-01-22 DIAGNOSIS — R8290 Unspecified abnormal findings in urine: Secondary | ICD-10-CM

## 2014-01-22 DIAGNOSIS — R410 Disorientation, unspecified: Secondary | ICD-10-CM

## 2014-01-22 DIAGNOSIS — F0391 Unspecified dementia with behavioral disturbance: Secondary | ICD-10-CM

## 2014-01-22 DIAGNOSIS — R82998 Other abnormal findings in urine: Secondary | ICD-10-CM

## 2014-01-22 DIAGNOSIS — R404 Transient alteration of awareness: Secondary | ICD-10-CM

## 2014-01-22 LAB — POCT URINALYSIS DIPSTICK
Bilirubin, UA: NEGATIVE
Blood, UA: NEGATIVE
Glucose, UA: NEGATIVE
Ketones, UA: NEGATIVE
Nitrite, UA: NEGATIVE
Protein, UA: NEGATIVE
Spec Grav, UA: 1.025
Urobilinogen, UA: 0.2
pH, UA: 6

## 2014-01-22 MED ORDER — RISPERIDONE 0.25 MG PO TABS: 0.2500 mg | ORAL_TABLET | Freq: Two times a day (BID) | ORAL | Status: DC

## 2014-01-22 NOTE — Progress Notes (Signed)
Patient ID: Sabrina Long, female   DOB: August 10, 1936, 78 y.o.   MRN: [id number]     No Known Allergies  Chief Complaint  Patient presents with  . Acute Visit    Combative and Restlessness    HPI: Patient is a 78 y.o. female seen in the office today for increase restlessness and aggressiveness, no sleep at night, staying up until 3 am and back up at 7 am; was better and now has gotten much worse in the last 3 weeks. Now has been violent  Refuses and son reports there is a "wrestling match" every time they try to give her a bath Anxiety is a lot worse, xanax makes her more anxious and behaviors worse.  Son reports pts only complaints is that her knee hurts He used to be able to take her on walks, she will not walk at this time.  Still eating and drinking good.  No cough or congestion No constipation No changes in her routine   Review of Systems:  Review of Systems  Unable to perform ROS: dementia     Past Medical History  Diagnosis Date  . Unspecified constipation   . Anxiety state, unspecified   . Colitis, enteritis, and gastroenteritis of presumed infectious origin 11/02/2011  . Pneumonia, organism unspecified 11/02/2011  . Cystitis, unspecified   . Impetigo   . Renal artery stenosis   . Dementia in conditions classified elsewhere with behavioral disturbance   . Hypercalcemia   . Reflux esophagitis   . Chest pain, unspecified   . Abnormality of gait   . Hyperosmolality and/or hypernatremia   . Unspecified disorder of liver   . Candidiasis of vulva and vagina   . Alzheimer's disease   . Chest pain, unspecified 08/19/2008  . Dementia in conditions classified elsewhere without behavioral disturbance   . Depressive disorder, not elsewhere classified   . Esophageal reflux   . Other and unspecified hyperlipidemia   . Loss of weight   . Unspecified persistent mental disorders due to conditions classified elsewhere   . Reactive confusion   . Unspecified essential  hypertension   . Other seborrheic keratosis   . Osteoarthrosis, unspecified whether generalized or localized, unspecified site   . Stiffness of joint, not elsewhere classified, unspecified site   . Memory loss 07/24/2007  . Headache(784.0)   . Undiagnosed cardiac murmurs    Past Surgical History  Procedure Laterality Date  . Cesarean section     Social History:   reports that she has never smoked. She does not have any smokeless tobacco history on file. Her alcohol and drug histories are not on file.  No family history on file.  Medications: Patient's Medications  New Prescriptions   No medications on file  Previous Medications   ALENDRONATE (FOSAMAX) 70 MG TABLET    Take 1 tablet (70 mg total) by mouth every 7 (seven) days. Take with a full glass of water on an empty stomach.   ALPRAZOLAM (XANAX) 0.5 MG TABLET    Take 1 tablet (0.5 mg total) by mouth at bedtime as needed for sleep. Take one tablet 3 times a day for anxiety   CRESTOR 10 MG TABLET    TAKE 1 TABLET ONCE DAILY FOR CHOLESTEROL   DICLOFENAC SODIUM (VOLTAREN) 1 % GEL    Apply 4 g topically 4 (four) times daily as needed. For right knee pain   HYDRALAZINE (APRESOLINE) 50 MG TABLET    Take 1 tablet (50 mg total) by mouth 3 (  three) times daily.   LOSARTAN (COZAAR) 25 MG TABLET    TAKE 1 TABLET DAILY FOR BLOOD PRESSURE   MEMANTINE (NAMENDA) 10 MG TABLET    Take one tablet twice a day for memory loss   METOPROLOL TARTRATE (LOPRESSOR) 25 MG TABLET    Take 1/2 tablet by mouth twice a day   MULTIPLE VITAMINS-MINERALS (CENTRUM SILVER PO)    Take by mouth. Take one tablet once a day   NITROGLYCERIN (NITROSTAT) 0.4 MG SL TABLET    Place 0.4 mg under the tongue every 5 (five) minutes as needed for chest pain. Take one tablet sublingual every 5 minutes not to exceed three tablets for chest pain   OMEPRAZOLE (PRILOSEC) 20 MG CAPSULE    TAKE 1 CAPSULE DAILY TO REDUCE STOMACH ACID AND TO HELP PROTECT ESOPHAGUS   ROSUVASTATIN (CRESTOR) 10  MG TABLET       SERTRALINE (ZOLOFT) 100 MG TABLET    Take 100 mg by mouth daily. Take one tablet once a day  Modified Medications   No medications on file  Discontinued Medications   No medications on file     Physical Exam:  Filed Vitals:   01/22/14 1213  BP: 134/82  Pulse: 80  Temp: 97.6 F (36.4 C)  TempSrc: Oral  Resp: 14  Weight: 145 lb 12.8 oz (66.134 kg)    Physical Exam  Constitutional: She is well-developed, well-nourished, and in no distress.  HENT:  Mouth/Throat: Oropharynx is clear and moist. No oropharyngeal exudate.  Eyes: Conjunctivae and EOM are normal. Pupils are equal, round, and reactive to light.  Neck: Normal range of motion. Neck supple.  Cardiovascular: Normal rate, regular rhythm and normal heart sounds.   Pulmonary/Chest: Effort normal and breath sounds normal. No respiratory distress.  Abdominal: Soft. Bowel sounds are normal. She exhibits no distension. There is no tenderness.  Musculoskeletal: She exhibits no edema and no tenderness.  Neurological: She is alert.  Skin: Skin is warm and dry.  Psychiatric: She has a flat affect.     Labs reviewed: Basic Metabolic Panel:  Recent Labs  16/10/96 1045 05/03/13 1200 05/04/13 0345  NA 137 141 140  K 4.9 3.6 3.3*  CL 103 109 110  CO2 23 23 24   GLUCOSE 155* 128* 112*  BUN 11 11 9   CREATININE 0.74 0.68 0.63  CALCIUM 9.8 9.4 9.2   Liver Function Tests:  Recent Labs  05/01/13 1827 05/03/13 1200 05/04/13 0345  AST 53* 44* 70*  ALT 52* 42* 56*  ALKPHOS 80 71 86  BILITOT 0.5 0.3 0.3  PROT 8.1 7.1 6.7  ALBUMIN 3.1* 2.5* 2.4*    Recent Labs  05/01/13 1827  LIPASE 19   No results found for this basename: AMMONIA,  in the last 8760 hours CBC:  Recent Labs  02/26/13 1202  05/01/13 1827 05/02/13 1045 05/03/13 1200 05/04/13 0345  WBC 5.5  < > 20.7* 19.9* 11.6* 9.1  NEUTROABS 2.4  --  17.4*  --   --  6.6  HGB 12.6  --  12.0 12.8 11.1* 10.2*  HCT 39.3  --  35.3* 37.6 33.3*  31.2*  MCV 100*  --  95.1 95.9 94.9 95.1  PLT  --   < > 192 188 212 221  < > = values in this interval not displayed. Lipid Panel:  Recent Labs  03/20/13 1143  HDL 70  LDLCALC 97  TRIG 62  CHOLHDL 2.6     Assessment/Plan 1. Dementia with behavioral disturbance  with acute Delirium  - behaviors are worse in the past 2 weeks, most likely due to progression of disease however will get lab work to rule out infection or other imbalance  - risperiDONE (RISPERDAL) 0.25 MG tablet; Take 1 tablet (0.25 mg total) by mouth 2 (two) times daily.  Dispense: 60 tablet; Refill: 2 - POC Urinalysis Dipstick - Comprehensive metabolic panel - CBC With differential/Platelet - TSH - POC Urinalysis Dipstick - Culture, Urine  3. Abnormal urine findings -will culture urine at this time - Culture, Urine   To follow up with Dr Renato Gails in 4-6 weeks due to new medication start and to check on behaviors

## 2014-01-22 NOTE — Patient Instructions (Signed)
Will check blook work and urine for secondary cause of behaviors  To follow up with Dr Renato Gailseed in 4-6 weeks  New Rx for Risperdal 0.25 mg twice a day-- if she is too sleepy during the day just take medication at night

## 2014-01-23 LAB — URINE CULTURE

## 2014-01-23 LAB — COMPREHENSIVE METABOLIC PANEL
ALBUMIN: 4.2 g/dL (ref 3.5–4.8)
ALT: 12 IU/L (ref 0–32)
AST: 15 IU/L (ref 0–40)
Albumin/Globulin Ratio: 1.2 (ref 1.1–2.5)
Alkaline Phosphatase: 71 IU/L (ref 39–117)
BUN/Creatinine Ratio: 16 (ref 11–26)
BUN: 14 mg/dL (ref 8–27)
CHLORIDE: 107 mmol/L (ref 97–108)
CO2: 22 mmol/L (ref 18–29)
Calcium: 9.9 mg/dL (ref 8.7–10.3)
Creatinine, Ser: 0.85 mg/dL (ref 0.57–1.00)
GFR calc Af Amer: 78 mL/min/{1.73_m2} (ref >59)
GFR calc non Af Amer: 67 mL/min/{1.73_m2} (ref >59)
GLUCOSE: 125 mg/dL — AB (ref 65–99)
Globulin, Total: 3.4 g/dL (ref 1.5–4.5)
POTASSIUM: 4.1 mmol/L (ref 3.5–5.2)
Sodium: 146 mmol/L — ABNORMAL HIGH (ref 134–144)
TOTAL PROTEIN: 7.6 g/dL (ref 6.0–8.5)
Total Bilirubin: <0.2 mg/dL (ref 0.0–1.2)

## 2014-01-23 LAB — CBC WITH DIFFERENTIAL
BASOS ABS: 0 10*3/uL (ref 0.0–0.2)
Basos: 0 %
EOS: 0 %
Eosinophils Absolute: 0 10*3/uL (ref 0.0–0.4)
HEMATOCRIT: 38.7 % (ref 34.0–46.6)
Hemoglobin: 12.6 g/dL (ref 11.1–15.9)
IMMATURE GRANULOCYTES: 0 %
Immature Grans (Abs): 0 10*3/uL (ref 0.0–0.1)
LYMPHS ABS: 3 10*3/uL (ref 0.7–3.1)
LYMPHS: 44 %
MCH: 31.6 pg (ref 26.6–33.0)
MCHC: 32.6 g/dL (ref 31.5–35.7)
MCV: 97 fL (ref 79–97)
MONOCYTES: 6 %
Monocytes Absolute: 0.4 10*3/uL (ref 0.1–0.9)
NEUTROS ABS: 3.4 10*3/uL (ref 1.4–7.0)
Neutrophils Relative %: 50 %
PLATELETS: 295 10*3/uL (ref 150–379)
RBC: 3.99 x10E6/uL (ref 3.77–5.28)
RDW: 13.8 % (ref 12.3–15.4)
WBC: 6.8 10*3/uL (ref 3.4–10.8)

## 2014-01-23 LAB — TSH: TSH: 2.18 u[IU]/mL (ref 0.450–4.500)

## 2014-02-08 ENCOUNTER — Other Ambulatory Visit: Payer: Self-pay | Admitting: Internal Medicine

## 2014-02-16 ENCOUNTER — Encounter: Payer: Self-pay | Admitting: Internal Medicine

## 2014-02-16 ENCOUNTER — Ambulatory Visit (INDEPENDENT_AMBULATORY_CARE_PROVIDER_SITE_OTHER): Payer: Medicare Other | Admitting: Internal Medicine

## 2014-02-16 VITALS — BP 122/70 | HR 56

## 2014-02-16 DIAGNOSIS — R5381 Other malaise: Secondary | ICD-10-CM

## 2014-02-16 DIAGNOSIS — F03918 Unspecified dementia, unspecified severity, with other behavioral disturbance: Secondary | ICD-10-CM

## 2014-02-16 DIAGNOSIS — R5383 Other fatigue: Secondary | ICD-10-CM

## 2014-02-16 DIAGNOSIS — F0391 Unspecified dementia with behavioral disturbance: Secondary | ICD-10-CM

## 2014-02-16 DIAGNOSIS — E870 Hyperosmolality and hypernatremia: Secondary | ICD-10-CM

## 2014-02-16 MED ORDER — QUETIAPINE 12.5 MG HALF TABLET: 25.0000 mg | ORAL_TABLET | Freq: Every day | ORAL | Status: DC

## 2014-02-16 NOTE — Progress Notes (Signed)
Patient ID: Consuello Closssterline P Hollett, female   DOB: 10/27/1935, 78 y.o.   MRN: [id number]   Location:  [facility]  Code Status: full code  No Known Allergies  Chief Complaint  Patient presents with  . Follow-up    4-6 week f/u behaivor  . other    lathargic (out of it), can't walk anymore & feeding her, only taking 1/2 tablet daily of the Risperdal     HPI: Patient is a 78 y.o. black female with dementia with behavioral disturbance was seen in the office today for f/u re: her sedation. She had been agitated, combative and picking up things throughout the house.  Since starting risperdal , she has been lethargic and not walking around at all.  Her son has cut back on her dose to just 0.5mg  at bedtime at this point, but she remains very lethargic.  Her son has been very concerned b/c she has been unable to walk on her own since taking the medication.    Review of Systems:  Review of Systems  Unable to perform ROS: medical condition    Past Medical History  Diagnosis Date  . Unspecified constipation   . Anxiety state, unspecified   . Colitis, enteritis, and gastroenteritis of presumed infectious origin 11/02/2011  . Pneumonia, organism unspecified 11/02/2011  . Cystitis, unspecified   . Impetigo   . Renal artery stenosis   . Dementia in conditions classified elsewhere with behavioral disturbance   . Hypercalcemia   . Reflux esophagitis   . Chest pain, unspecified   . Abnormality of gait   . Hyperosmolality and/or hypernatremia   . Unspecified disorder of liver   . Candidiasis of vulva and vagina   . Alzheimer's disease   . Chest pain, unspecified 08/19/2008  . Dementia in conditions classified elsewhere without behavioral disturbance   . Depressive disorder, not elsewhere classified   . Esophageal reflux   . Other and unspecified hyperlipidemia   . Loss of weight   . Unspecified persistent mental disorders due to conditions classified  elsewhere   . Reactive confusion   . Unspecified essential hypertension   . Other seborrheic keratosis   . Osteoarthrosis, unspecified whether generalized or localized, unspecified site   . Stiffness of joint, not elsewhere classified, unspecified site   . Memory loss 07/24/2007  . Headache(784.0)   . Undiagnosed cardiac murmurs     Past Surgical History  Procedure Laterality Date  . Cesarean section      Social History:   reports that she has never smoked. She does not have any smokeless tobacco history on file. Her alcohol and drug histories are not on file.  History reviewed. No pertinent family history.  Medications: Patient's Medications  New Prescriptions   No medications on file  Previous Medications   ALENDRONATE (FOSAMAX) 70 MG TABLET    Take 1 tablet (70 mg total) by mouth every 7 (seven) days. Take with a full glass of water on an empty stomach.   ALPRAZOLAM (XANAX) 0.5 MG TABLET    Take 1 tablet (0.5 mg total) by mouth at bedtime as needed for sleep. Take one tablet 3 times a day for anxiety   CRESTOR 10 MG TABLET    TAKE 1 TABLET DAILY FOR CHOLESTEROL   DICLOFENAC SODIUM (VOLTAREN) 1 % GEL    Apply 4 g topically 4 (four) times daily as needed. For right knee pain   HYDRALAZINE (APRESOLINE) 50 MG TABLET  Take 1 tablet (50 mg total) by mouth 3 (three) times daily.   LOSARTAN (COZAAR) 25 MG TABLET    TAKE 1 TABLET DAILY FOR BLOOD PRESSURE   MEMANTINE (NAMENDA) 10 MG TABLET    Take one tablet twice a day for memory loss   METOPROLOL TARTRATE (LOPRESSOR) 25 MG TABLET    Take 1/2 tablet by mouth twice a day   MULTIPLE VITAMINS-MINERALS (CENTRUM SILVER PO)    Take by mouth. Take one tablet once a day   NITROGLYCERIN (NITROSTAT) 0.4 MG SL TABLET    Place 0.4 mg under the tongue every 5 (five) minutes as needed for chest pain. Take one tablet sublingual every 5 minutes not to exceed three tablets for chest pain   OMEPRAZOLE (PRILOSEC) 20 MG CAPSULE    TAKE 1 CAPSULE DAILY TO  REDUCE STOMACH ACID AND TO HELP PROTECT ESOPHAGUS   RISPERIDONE (RISPERDAL) 0.25 MG TABLET    Take 1 tablet (0.25 mg total) by mouth 2 (two) times daily.   ROSUVASTATIN (CRESTOR) 10 MG TABLET       SERTRALINE (ZOLOFT) 100 MG TABLET    Take 100 mg by mouth daily. Take one tablet once a day  Modified Medications   No medications on file  Discontinued Medications   No medications on file     Physical Exam: Filed Vitals:   02/16/14 1340  BP: 122/70  Pulse: 56  SpO2: 92%  Physical Exam  Constitutional:  Thin black female  Cardiovascular: Normal rate, regular rhythm, normal heart sounds and intact distal pulses.   Pulmonary/Chest: Effort normal and breath sounds normal. No respiratory distress.  Abdominal: Soft. Bowel sounds are normal.  Neurological:  Lethargic, sitting with eyes closed, but is able to follow commands and moves all 4 extremities w/o evidence of weakness  Skin: Skin is warm and dry.  Multiple seborrheic and actinic keratoses    Labs reviewed: Basic Metabolic Panel:  Recent Labs  16/10/96 1200 05/04/13 0345 01/22/14 1301  NA 141 140 146*  K 3.6 3.3* 4.1  CL 109 110 107  CO2 23 24 22   GLUCOSE 128* 112* 125*  BUN 11 9 14   CREATININE 0.68 0.63 0.85  CALCIUM 9.4 9.2 9.9  TSH  --   --  2.180   Liver Function Tests:  Recent Labs  05/01/13 1827 05/03/13 1200 05/04/13 0345 01/22/14 1301  AST 53* 44* 70* 15  ALT 52* 42* 56* 12  ALKPHOS 80 71 86 71  BILITOT 0.5 0.3 0.3 <0.2  PROT 8.1 7.1 6.7 7.6  ALBUMIN 3.1* 2.5* 2.4*  --     Recent Labs  05/01/13 1827  LIPASE 19   No results found for this basename: AMMONIA,  in the last 8760 hours CBC:  Recent Labs  05/01/13 1827  05/03/13 1200 05/04/13 0345 01/22/14 1301  WBC 20.7*  < > 11.6* 9.1 6.8  NEUTROABS 17.4*  --   --  6.6 3.4  HGB 12.0  < > 11.1* 10.2* 12.6  HCT 35.3*  < > 33.3* 31.2* 38.7  MCV 95.1  < > 94.9 95.1 97  PLT 192  < > 212 221 295  < > = values in this interval not  displayed. Lipid Panel:  Recent Labs  03/20/13 1143  HDL 70  LDLCALC 97  TRIG 62  CHOLHDL 2.6   Assessment/Plan 1. Lethargy -due to risperdal--even at lowest dose, puts her to sleep--added to intolerances  2. Dementia with behavioral disturbance - had become combative so was  started on risperdal, but this has been oversedated even at risperdal 0.5mg  po qhs -will taper off risperdal altogether -if behaviors and psychosis remain significant, start seroquel 0.25mg  at bedtime for psychosis - QUEtiapine (SEROQUEL) 12.5 mg TABS tablet; Take 1 tablet (25 mg total) by mouth at bedtime. For psychosis  Dispense: 30 tablet; Refill: 3  3. Hypernatremia - has been drinking per son--he's been making sure she does get water. - f/u Basic metabolic panel to make sure this is not why she is sedated if Na has come up more or now is too low.  Labs/tests ordered:  Bmp today Next appt:  2 wks with Shanda BumpsJessica

## 2014-02-17 ENCOUNTER — Encounter (HOSPITAL_COMMUNITY): Payer: Self-pay | Admitting: Emergency Medicine

## 2014-02-17 ENCOUNTER — Telehealth: Payer: Self-pay

## 2014-02-17 ENCOUNTER — Inpatient Hospital Stay (HOSPITAL_COMMUNITY)
Admission: EM | Admit: 2014-02-17 | Discharge: 2014-02-20 | DRG: 640 | Disposition: A | Discharge status: Skilled Nursing Facility | Payer: Medicare Other | Attending: Internal Medicine | Admitting: Internal Medicine

## 2014-02-17 DIAGNOSIS — A419 Sepsis, unspecified organism: Secondary | ICD-10-CM

## 2014-02-17 DIAGNOSIS — E785 Hyperlipidemia, unspecified: Secondary | ICD-10-CM

## 2014-02-17 DIAGNOSIS — I10 Essential (primary) hypertension: Secondary | ICD-10-CM | POA: Diagnosis present

## 2014-02-17 DIAGNOSIS — F0280 Dementia in other diseases classified elsewhere without behavioral disturbance: Secondary | ICD-10-CM | POA: Diagnosis present

## 2014-02-17 DIAGNOSIS — N710 Acute inflammatory disease of uterus: Secondary | ICD-10-CM

## 2014-02-17 DIAGNOSIS — R531 Weakness: Secondary | ICD-10-CM

## 2014-02-17 DIAGNOSIS — IMO0002 Reserved for concepts with insufficient information to code with codable children: Secondary | ICD-10-CM

## 2014-02-17 DIAGNOSIS — E861 Hypovolemia: Secondary | ICD-10-CM

## 2014-02-17 DIAGNOSIS — F0390 Unspecified dementia without behavioral disturbance: Secondary | ICD-10-CM

## 2014-02-17 DIAGNOSIS — N390 Urinary tract infection, site not specified: Secondary | ICD-10-CM

## 2014-02-17 DIAGNOSIS — F3289 Other specified depressive episodes: Secondary | ICD-10-CM | POA: Diagnosis present

## 2014-02-17 DIAGNOSIS — F0391 Unspecified dementia with behavioral disturbance: Secondary | ICD-10-CM | POA: Diagnosis present

## 2014-02-17 DIAGNOSIS — R000 Tachycardia, unspecified: Secondary | ICD-10-CM

## 2014-02-17 DIAGNOSIS — R4182 Altered mental status, unspecified: Secondary | ICD-10-CM

## 2014-02-17 DIAGNOSIS — A481 Legionnaires' disease: Secondary | ICD-10-CM

## 2014-02-17 DIAGNOSIS — E876 Hypokalemia: Secondary | ICD-10-CM | POA: Diagnosis present

## 2014-02-17 DIAGNOSIS — I701 Atherosclerosis of renal artery: Secondary | ICD-10-CM

## 2014-02-17 DIAGNOSIS — G309 Alzheimer's disease, unspecified: Secondary | ICD-10-CM | POA: Diagnosis present

## 2014-02-17 DIAGNOSIS — R627 Adult failure to thrive: Secondary | ICD-10-CM

## 2014-02-17 DIAGNOSIS — F29 Unspecified psychosis not due to a substance or known physiological condition: Secondary | ICD-10-CM | POA: Diagnosis present

## 2014-02-17 DIAGNOSIS — Z79899 Other long term (current) drug therapy: Secondary | ICD-10-CM

## 2014-02-17 DIAGNOSIS — G9340 Encephalopathy, unspecified: Secondary | ICD-10-CM

## 2014-02-17 DIAGNOSIS — K219 Gastro-esophageal reflux disease without esophagitis: Secondary | ICD-10-CM

## 2014-02-17 DIAGNOSIS — K5900 Constipation, unspecified: Secondary | ICD-10-CM

## 2014-02-17 DIAGNOSIS — R7309 Other abnormal glucose: Secondary | ICD-10-CM | POA: Diagnosis present

## 2014-02-17 DIAGNOSIS — R410 Disorientation, unspecified: Secondary | ICD-10-CM

## 2014-02-17 DIAGNOSIS — F03918 Unspecified dementia, unspecified severity, with other behavioral disturbance: Secondary | ICD-10-CM

## 2014-02-17 DIAGNOSIS — F329 Major depressive disorder, single episode, unspecified: Secondary | ICD-10-CM | POA: Diagnosis present

## 2014-02-17 DIAGNOSIS — D696 Thrombocytopenia, unspecified: Secondary | ICD-10-CM | POA: Diagnosis present

## 2014-02-17 DIAGNOSIS — E870 Hyperosmolality and hypernatremia: Principal | ICD-10-CM

## 2014-02-17 DIAGNOSIS — R5383 Other fatigue: Secondary | ICD-10-CM

## 2014-02-17 DIAGNOSIS — F411 Generalized anxiety disorder: Secondary | ICD-10-CM | POA: Diagnosis present

## 2014-02-17 DIAGNOSIS — I498 Other specified cardiac arrhythmias: Secondary | ICD-10-CM | POA: Diagnosis present

## 2014-02-17 LAB — CBC WITH DIFFERENTIAL/PLATELET
Basophils Absolute: 0 10*3/uL (ref 0.0–0.1)
Basophils Relative: 0 % (ref 0–1)
Eosinophils Absolute: 0 10*3/uL (ref 0.0–0.7)
Eosinophils Relative: 0 % (ref 0–5)
HCT: 45.3 % (ref 36.0–46.0)
Hemoglobin: 13.8 g/dL (ref 12.0–15.0)
Lymphocytes Relative: 28 % (ref 12–46)
Lymphs Abs: 2.5 K/uL (ref 0.7–4.0)
MCH: 31.3 pg (ref 26.0–34.0)
MCHC: 30.5 g/dL (ref 30.0–36.0)
MCV: 102.7 fL — ABNORMAL HIGH (ref 78.0–100.0)
Monocytes Absolute: 0.5 K/uL (ref 0.1–1.0)
Monocytes Relative: 6 % (ref 3–12)
Neutro Abs: 5.7 K/uL (ref 1.7–7.7)
Neutrophils Relative %: 66 % (ref 43–77)
Platelets: 139 10*3/uL — ABNORMAL LOW (ref 150–400)
RBC: 4.41 MIL/uL (ref 3.87–5.11)
RDW: 13.9 % (ref 11.5–15.5)
WBC: 8.7 10*3/uL (ref 4.0–10.5)

## 2014-02-17 LAB — BASIC METABOLIC PANEL WITH GFR
BUN: 28 mg/dL — ABNORMAL HIGH (ref 6–23)
CO2: 26 meq/L (ref 19–32)
Chloride: 123 meq/L — ABNORMAL HIGH (ref 96–112)
Glucose, Bld: 220 mg/dL — ABNORMAL HIGH (ref 70–99)
Potassium: 3.4 meq/L — ABNORMAL LOW (ref 3.7–5.3)

## 2014-02-17 LAB — BASIC METABOLIC PANEL
BUN/Creatinine Ratio: 18 (ref 11–26)
BUN: 30 mg/dL — ABNORMAL HIGH (ref 8–27)
CO2: 23 mmol/L (ref 18–29)
Calcium: 10 mg/dL (ref 8.7–10.3)
Calcium: 10.1 mg/dL (ref 8.4–10.5)
Chloride: 122 mmol/L (ref 97–108)
Creatinine, Ser: 1.63 mg/dL — ABNORMAL HIGH (ref 0.57–1.00)
Creatinine, Ser: 1.16 mg/dL — ABNORMAL HIGH (ref 0.50–1.10)
GFR calc Af Amer: 35 mL/min/{1.73_m2} — ABNORMAL LOW (ref >59)
GFR calc Af Amer: 52 mL/min — ABNORMAL LOW (ref >90)
GFR calc non Af Amer: 31 mL/min/{1.73_m2} — ABNORMAL LOW (ref >59)
GFR calc non Af Amer: 45 mL/min — ABNORMAL LOW (ref >90)
Glucose: 116 mg/dL — ABNORMAL HIGH (ref 65–99)
Potassium: 3.8 mmol/L (ref 3.5–5.2)
Sodium: 163 mmol/L (ref 134–144)
Sodium: 163 mEq/L (ref 137–147)

## 2014-02-17 LAB — URINALYSIS, ROUTINE W REFLEX MICROSCOPIC
Bilirubin Urine: NEGATIVE
Glucose, UA: NEGATIVE mg/dL
Ketones, ur: NEGATIVE mg/dL
Nitrite: NEGATIVE
Protein, ur: 100 mg/dL — AB
Specific Gravity, Urine: 1.026 (ref 1.005–1.030)
Urobilinogen, UA: 1 mg/dL (ref 0.0–1.0)
pH: 5 (ref 5.0–8.0)

## 2014-02-17 LAB — GLUCOSE, CAPILLARY
Glucose-Capillary: 96 mg/dL (ref 70–99)
Glucose-Capillary: 111 mg/dL — ABNORMAL HIGH (ref 70–99)

## 2014-02-17 LAB — URINE MICROSCOPIC-ADD ON

## 2014-02-17 LAB — MRSA PCR SCREENING: MRSA by PCR: NEGATIVE

## 2014-02-17 LAB — AMMONIA: Ammonia: 54 umol/L (ref 11–60)

## 2014-02-17 LAB — TSH: TSH: 2.33 u[IU]/mL (ref 0.350–4.500)

## 2014-02-17 LAB — SODIUM, URINE, RANDOM: Sodium, Ur: 70 mEq/L

## 2014-02-17 MED ORDER — ONDANSETRON HCL 4 MG/2ML IJ SOLN: 4.0000 mg | Freq: Four times a day (QID) | INTRAMUSCULAR | Status: DC | PRN

## 2014-02-17 MED ORDER — HEPARIN SODIUM (PORCINE) 5000 UNIT/ML IJ SOLN
5000.0000 [IU] | Freq: Three times a day (TID) | INTRAMUSCULAR | Status: DC
  Administered 2014-02-17 – 2014-02-20 (×10): 5000 [IU] via SUBCUTANEOUS
  Filled 2014-02-17 (×12): qty 1

## 2014-02-17 MED ORDER — INSULIN ASPART 100 UNIT/ML ~~LOC~~ SOLN: 0.0000 [IU] | Freq: Every day | SUBCUTANEOUS | Status: DC

## 2014-02-17 MED ORDER — SODIUM CHLORIDE 0.9 % IV BOLUS (SEPSIS)
500.0000 mL | Freq: Once | INTRAVENOUS | Status: AC
  Administered 2014-02-17: 500 mL via INTRAVENOUS

## 2014-02-17 MED ORDER — SORBITOL 70 % SOLN: 30.0000 mL | Freq: Every day | Status: DC | PRN

## 2014-02-17 MED ORDER — HYDRALAZINE HCL 20 MG/ML IJ SOLN
10.0000 mg | INTRAMUSCULAR | Status: DC | PRN
  Administered 2014-02-17 – 2014-02-19 (×2): 10 mg via INTRAVENOUS
  Filled 2014-02-17 (×2): qty 1

## 2014-02-17 MED ORDER — ALUM & MAG HYDROXIDE-SIMETH 200-200-20 MG/5ML PO SUSP: 30.0000 mL | Freq: Four times a day (QID) | ORAL | Status: DC | PRN

## 2014-02-17 MED ORDER — INSULIN ASPART 100 UNIT/ML ~~LOC~~ SOLN
0.0000 [IU] | Freq: Three times a day (TID) | SUBCUTANEOUS | Status: DC
  Administered 2014-02-18: 3 [IU] via SUBCUTANEOUS
  Administered 2014-02-19: 2 [IU] via SUBCUTANEOUS
  Administered 2014-02-19: 3 [IU] via SUBCUTANEOUS
  Administered 2014-02-19 – 2014-02-20 (×3): 2 [IU] via SUBCUTANEOUS

## 2014-02-17 MED ORDER — ONDANSETRON HCL 4 MG PO TABS: 4.0000 mg | ORAL_TABLET | Freq: Four times a day (QID) | ORAL | Status: DC | PRN

## 2014-02-17 MED ORDER — ACETAMINOPHEN 650 MG RE SUPP: 650.0000 mg | Freq: Four times a day (QID) | RECTAL | Status: DC | PRN

## 2014-02-17 MED ORDER — ACETAMINOPHEN 325 MG PO TABS: 650.0000 mg | ORAL_TABLET | Freq: Four times a day (QID) | ORAL | Status: DC | PRN

## 2014-02-17 MED ORDER — POTASSIUM CHLORIDE IN NACL 20-0.45 MEQ/L-% IV SOLN
INTRAVENOUS | Status: DC
  Administered 2014-02-17: 75 mL/h via INTRAVENOUS
  Filled 2014-02-17 (×2): qty 1000

## 2014-02-17 MED ORDER — SODIUM CHLORIDE 0.9 % IV SOLN: INTRAVENOUS | Status: DC

## 2014-02-17 MED ORDER — DEXTROSE 5 % IV SOLN
1.0000 g | INTRAVENOUS | Status: DC
  Administered 2014-02-17 – 2014-02-19 (×3): 1 g via INTRAVENOUS
  Filled 2014-02-17 (×4): qty 10

## 2014-02-17 MED ORDER — SODIUM CHLORIDE 0.9 % IJ SOLN
3.0000 mL | Freq: Two times a day (BID) | INTRAMUSCULAR | Status: DC
  Administered 2014-02-17 – 2014-02-19 (×2): 3 mL via INTRAVENOUS

## 2014-02-17 NOTE — ED Notes (Signed)
Patient transported by [place], NT.

## 2014-02-17 NOTE — H&P (Signed)
[facility] History and Physical  Sabrina Closssterline P Helmers UJW:[id number] DOB: [birth year] DOA: 02/17/2014  Referring physician:  PCP: Bufford SpikesEED, [name], DO   Chief Complaint: Altered mental status  HPI: Sabrina Long is a 78 y.o. female with a past medical history of dementia with behavioral disturbance, presenting to the emergency room with complaints of mental status changes, failure to thrive, functional decline, lethargy and minimal by mouth intake. Patient had been on Risperdal therapy for behavioral changes associated with her dementia. Since starting Risperdal patient has been lethargic and minimally active, for which her primary care physician changed her to Seroquel 12.5 mg at bedtime as needed for psychosis. Lab work in the emergency room showing an elevated sodium of 163. Patient presents lethargic, difficult to arouse, unable to provide history. Her son states that she has been in this state for the last several days. She was administered a 500 cc bolus of normal saline in the emergency room followed by normal saline at 150 mL per hour. Patient lethargic and can not participate in her own plan of card. History was obtained from her son present at bedside                                                                  Review of Systems:  Could not obtain reliable review of systems do to lethargy   Past Medical History  Diagnosis Date  . Unspecified constipation   . Anxiety state, unspecified   . Colitis, enteritis, and gastroenteritis of presumed infectious origin 11/02/2011  . Pneumonia, organism unspecified 11/02/2011  . Cystitis, unspecified   . Impetigo   . Renal artery stenosis   . Dementia in conditions classified elsewhere with behavioral disturbance   . Hypercalcemia   . Reflux esophagitis   . Chest pain, unspecified   . Abnormality of gait   . Hyperosmolality and/or hypernatremia   . Unspecified disorder of liver   . Candidiasis of vulva and vagina   .  Alzheimer's disease   . Chest pain, unspecified 08/19/2008  . Dementia in conditions classified elsewhere without behavioral disturbance   . Depressive disorder, not elsewhere classified   . Esophageal reflux   . Other and unspecified hyperlipidemia   . Loss of weight   . Unspecified persistent mental disorders due to conditions classified elsewhere   . Reactive confusion   . Unspecified essential hypertension   . Other seborrheic keratosis   . Osteoarthrosis, unspecified whether generalized or localized, unspecified site   . Stiffness of joint, not elsewhere classified, unspecified site   . Memory loss 07/24/2007  . Headache(784.0)   . Undiagnosed cardiac murmurs    Past Surgical History  Procedure Laterality Date  . Cesarean section     Social History:  reports that she has never smoked. She has never used smokeless tobacco. She reports that she does not drink alcohol or use illicit drugs.  Allergies  Allergen Reactions  . Risperdal [Risperidone] Other (See Comments)    sedation    History reviewed. No pertinent family history.   Prior to Admission medications   Medication Sig Start Date End Date Taking? Authorizing Provider  alendronate (FOSAMAX) 70 MG tablet Take 1 tablet (70 mg total) by mouth every 7 (seven) days. Take with a full glass  of water on an empty stomach. 10/28/13  Yes Kimber RelicArthur G Green, MD  ALPRAZolam Prudy Feeler(XANAX) 0.5 MG tablet Take 0.5 mg by mouth See admin instructions. Take 0.5 mg three times daily and 0.5 mg at bedtime as needed for sleep.   Yes Historical Provider, MD  diclofenac sodium (VOLTAREN) 1 % GEL Apply 4 g topically 4 (four) times daily as needed. For right knee pain 12/22/13  Yes [name] L Reed, DO  hydrALAZINE (APRESOLINE) 50 MG tablet Take 1 tablet (50 mg total) by mouth 3 (three) times daily. 01/12/14  Yes [name] L Reed, DO  losartan (COZAAR) 25 MG tablet Take 25 mg by mouth daily.   Yes Historical Provider, MD  memantine (NAMENDA) 10 MG tablet Take 10 mg  by mouth 2 (two) times daily.   Yes Historical Provider, MD  metoprolol tartrate (LOPRESSOR) 25 MG tablet Take 1/2 tablet by mouth twice a day 12/22/13  Yes [name] L Reed, DO  Multiple Vitamins-Minerals (CENTRUM SILVER PO) Take 1 tablet by mouth daily.    Yes Historical Provider, MD  omeprazole (PRILOSEC) 20 MG capsule Take 20 mg by mouth daily.   Yes Historical Provider, MD  QUEtiapine (SEROQUEL) 12.5 mg TABS tablet Take 1 tablet (25 mg total) by mouth at bedtime. For psychosis 02/16/14  Yes [name] L Reed, DO  rosuvastatin (CRESTOR) 10 MG tablet Take 10 mg by mouth daily.  04/09/13  Yes Mahima Pandey, MD  sertraline (ZOLOFT) 100 MG tablet Take 100 mg by mouth daily.    Yes Historical Provider, MD  nitroGLYCERIN (NITROSTAT) 0.4 MG SL tablet Place 0.4 mg under the tongue every 5 (five) minutes as needed for chest pain. Take one tablet sublingual every 5 minutes not to exceed three tablets for chest pain    Historical Provider, MD   Physical Exam: Filed Vitals:   02/17/14 1404  BP: 190/83  Pulse: 85  Temp: 98.1 F (36.7 C)  Resp: 18    BP 190/83  Pulse 85  Temp(Src) 98.1 F (36.7 C) (Axillary)  Resp 18  Ht 5\' 4"  (1.626 m)  Wt 61.462 kg (135 lb 8 oz)  BMI 23.25 kg/m2  SpO2 99%  General: Patient is lethargic, difficult to arouse, did follow simple commands like squeezing my fingers. She falls right back to sleep after being woken up Eyes: PERRL, normal lids, irises & conjunctiva ENT: grossly normal hearing, lips & tongue, dry oral mucosa Neck: no LAD, masses or thyromegaly Cardiovascular: RRR, no m/r/g. No LE edema. Telemetry: SR, no arrhythmias  Respiratory: CTA bilaterally, no w/r/r. Normal respiratory effort. Abdomen: soft, ntnd Skin: no rash or induration seen on limited exam Musculoskeletal: She has 1+ bilateral extremity pitting edema Psychiatric: Lethargic Neurologic: Could not perform adequate neurologic examination given the presence of lethargy. From what I can best O. she  is moving all extremities, however could not participate in neurologic examination given mental status changes.           Labs on Admission:  Basic Metabolic Panel:  Recent Labs Lab 02/16/14 1416 02/17/14 1047  NA 163* 163*  K 3.8 3.4*  CL 122* 123*  CO2 23 26  GLUCOSE 116* 220*  BUN 30* 28*  CREATININE 1.63* 1.16*  CALCIUM 10.0 10.1   Liver Function Tests: No results found for this basename: AST, ALT, ALKPHOS, BILITOT, PROT, ALBUMIN,  in the last 168 hours No results found for this basename: LIPASE, AMYLASE,  in the last 168 hours No results found for this basename: AMMONIA,  in  the last 168 hours CBC:  Recent Labs Lab 02/17/14 1047  WBC 8.7  NEUTROABS 5.7  HGB 13.8  HCT 45.3  MCV 102.7*  PLT 139*   Cardiac Enzymes: No results found for this basename: CKTOTAL, CKMB, CKMBINDEX, TROPONINI,  in the last 168 hours  BNP (last 3 results)  Recent Labs  05/03/13 0500  PROBNP 440.8*   CBG: No results found for this basename: GLUCAP,  in the last 168 hours  Radiological Exams on Admission: No results found.  EKG: Independently reviewed.   Assessment/Plan Principal Problem:   Hypernatremia Active Problems:   FTT (failure to thrive) in adult   Acute encephalopathy   UTI (urinary tract infection)   Dementia with behavioral disturbance   1. Acute encephalopathy. I suspect acute encephalopathy resulting from a combination of infection, hypernatremia, dehydration as well as oversedation from antipsychotic medications. She was administered 500 mL bolus of normal saline in the emergency department. Will continue fluids overnight. Hold off on all psychotropic medications, reassess in morning 2. Hypernatremia. I suspect secondary to dehydration, as she likely has had decreased by mouth intake from been in an over sedated state for the past several days. It is possible that hypernatremia may be contributing to mental status changes. Will provide  Half normal saline with  20 mEq of K. running at 75 mL per hour, followup on a.m. sodium levels. 3. Failure to thrive/functional decline. Patient with history of dementia associated with behavioral disturbances. She was recently started on antipsychotic therapy due to increase agitation at home. It seems that this has led to oversedation. Urinalysis on admission showing presence of many bacteria and moderate leukocyte esterase. Underlying urinary tract infection also contributing to functional decline. Starting empiric antibiotics. Providing IV fluids, discontinuing all antipsychotics, correcting electrolytes, physical therapy consultation once patient more awake. 4. Urinary tract infection. As mentioned above, I suspected underlying UTI contributing to patient's functional decline. Will start ceftriaxone 1 g IV every 24 hours, followup on urine cultures 5. Hyperglycemia. Labs showed a glucose of 220. Will check a hemoglobin A1c provide sliding scale coverage with Accu-Cheks q. a.c. each bedtime 6. Hypokalemia. Patient having a potassium of 3.4. Added potassium to IV fluids, repeat lab work in a.m. 7. Hypertension. Patient lethargic, I don't think she'll be able to take medications in current state. Will hold off on oral antihypertensive agents, provide when necessary hydralazine for systolic blood pressures greater than 160.  8. DVT prophylaxis. Subcutaneous heparin   Code Status: Full Code Family Communication: I spoke with patient's son present at bedside Disposition Plan: Will admit to telemetry, anticipate she will require greater than 2 nights hospitalization  Time spent: 70 min  Jeralyn Bennett [facility] Pager [phone]

## 2014-02-17 NOTE — Telephone Encounter (Signed)
Spoke with patient's daughters and informed them both that patient should go to ER per Dr.Pandey for elevated sodium levels. Patient's daughters both verbalized understanding and indicated [facility] as indicated

## 2014-02-17 NOTE — ED Notes (Signed)
[place]idence Cros[place]isha, NT and [place], NT at the bedside trying to get patient on the bedside commode.

## 2014-02-17 NOTE — ED Notes (Signed)
CRITICAL VALUE ALERT  Critical value received:  Sodium 163   Date of notification:  02/17/14  Time of notification:  1150  Critical value read back: No  Nurse who received alert:  Apolonio SchneidersHannah Steele RN  MD notified (1st page): Dr. Oletta LamasGhim  Time of first page: 1151  MD notified (2nd page):  Time of second page:  Responding MD: Dr. Oletta LamasGhim   Time MD responded:  [phone]

## 2014-02-17 NOTE — ED Notes (Signed)
IV start unsuccessful x 2.    Called charge for another RN to come try.

## 2014-02-17 NOTE — ED Notes (Signed)
Patient was sent by her md to ED today for high sodium.  Patient has been on risperdal for awhile but has been weaning down for the last few days.

## 2014-02-17 NOTE — Progress Notes (Signed)
UR completed. Jeryl Umholtz RN CCM Case Mgmt phone [phone] 

## 2014-02-17 NOTE — ED Notes (Signed)
Patient taken off the bedpan and placed on the bedside commode with [place], NT assistance. Patient unable to void. Placed back in bed and repositioned for comfort.

## 2014-02-17 NOTE — ED Provider Notes (Signed)
CSN: [id number]     Arrival date & time 02/17/14  0946 History   First MD Initiated Contact with Patient 02/17/14 [phone]     Chief Complaint  Patient presents with  . dr sent      (Consider location/radiation/quality/duration/timing/severity/associated sxs/prior Treatment) HPI Comments: Pt has been agitated for a couple of weeks, has a h/o dementia per son.  Pt lives with son.  He reports she has no medical problems.  He seems a little flustered.  He was called this AM and told that her blood tests drawn at PCP office yesterday resulted and that Na was high at 170's and she needed to come to the hospital immediately.  Prior records indicated numerous health issues including a h/o hyperosmolality and hypernatremia in the past.  He reports she has been eating and drinking well, she had been on risperdal recently, but has now been weaned off because it was too strong in controlling her agitation.  No N/V/D, no fevers, no cough.    The history is provided by the patient, medical records and a relative. The history is limited by the condition of the patient.    Past Medical History  Diagnosis Date  . Unspecified constipation   . Anxiety state, unspecified   . Colitis, enteritis, and gastroenteritis of presumed infectious origin 11/02/2011  . Pneumonia, organism unspecified 11/02/2011  . Cystitis, unspecified   . Impetigo   . Renal artery stenosis   . Dementia in conditions classified elsewhere with behavioral disturbance   . Hypercalcemia   . Reflux esophagitis   . Chest pain, unspecified   . Abnormality of gait   . Hyperosmolality and/or hypernatremia   . Unspecified disorder of liver   . Candidiasis of vulva and vagina   . Alzheimer's disease   . Chest pain, unspecified 08/19/2008  . Dementia in conditions classified elsewhere without behavioral disturbance   . Depressive disorder, not elsewhere classified   . Esophageal reflux   . Other and unspecified hyperlipidemia   . Loss of  weight   . Unspecified persistent mental disorders due to conditions classified elsewhere   . Reactive confusion   . Unspecified essential hypertension   . Other seborrheic keratosis   . Osteoarthrosis, unspecified whether generalized or localized, unspecified site   . Stiffness of joint, not elsewhere classified, unspecified site   . Memory loss 07/24/2007  . Headache(784.0)   . Undiagnosed cardiac murmurs    Past Surgical History  Procedure Laterality Date  . Cesarean section     History reviewed. No pertinent family history. History  Substance Use Topics  . Smoking status: Never Smoker   . Smokeless tobacco: Not on file  . Alcohol Use: No   OB History   Grav Para Term Preterm Abortions TAB SAB Ect Mult Living                 Review of Systems  Unable to perform ROS: Acuity of condition      Allergies  Risperdal  Home Medications   Prior to Admission medications   Medication Sig Start Date End Date Taking? Authorizing Provider  alendronate (FOSAMAX) 70 MG tablet Take 1 tablet (70 mg total) by mouth every 7 (seven) days. Take with a full glass of water on an empty stomach. 10/28/13   Kimber RelicArthur G Green, MD  ALPRAZolam Prudy Feeler(XANAX) 0.5 MG tablet Take 1 tablet (0.5 mg total) by mouth at bedtime as needed for sleep. Take one tablet 3 times a day for anxiety 12/19/13  [name] L Reed, DO  diclofenac sodium (VOLTAREN) 1 % GEL Apply 4 g topically 4 (four) times daily as needed. For right knee pain 12/22/13   [name] L Reed, DO  hydrALAZINE (APRESOLINE) 50 MG tablet Take 1 tablet (50 mg total) by mouth 3 (three) times daily. 01/12/14   [name] L Reed, DO  memantine (NAMENDA) 10 MG tablet Take one tablet twice a day for memory loss 10/28/13   Kimber Relic, MD  metoprolol tartrate (LOPRESSOR) 25 MG tablet Take 1/2 tablet by mouth twice a day 12/22/13   Kermit Balo, DO  Multiple Vitamins-Minerals (CENTRUM SILVER PO) Take by mouth. Take one tablet once a day    Historical Provider, MD   nitroGLYCERIN (NITROSTAT) 0.4 MG SL tablet Place 0.4 mg under the tongue every 5 (five) minutes as needed for chest pain. Take one tablet sublingual every 5 minutes not to exceed three tablets for chest pain    Historical Provider, MD  QUEtiapine (SEROQUEL) 12.5 mg TABS tablet Take 1 tablet (25 mg total) by mouth at bedtime. For psychosis 02/16/14   [name] L Reed, DO  risperiDONE (RISPERDAL) 0.25 MG tablet Take 1 tablet (0.25 mg total) by mouth 2 (two) times daily. 01/22/14   Claudie Revering, NP  rosuvastatin (CRESTOR) 10 MG tablet  04/09/13   Oneal Grout, MD  sertraline (ZOLOFT) 100 MG tablet Take 100 mg by mouth daily. Take one tablet once a day    Historical Provider, MD   BP 145/84  Pulse 105  Temp(Src) 97.7 F (36.5 C) (Oral)  Resp 20  Ht 5\' 4"  (1.626 m)  Wt 144 lb (65.318 kg)  BMI 24.71 kg/m2  SpO2 100% Physical Exam  Nursing note and vitals reviewed. Constitutional: She appears well-developed and well-nourished. She is uncooperative.  Non-toxic appearance. She appears ill. No distress.  HENT:  Head: Normocephalic and atraumatic.  Mouth/Throat: Uvula is midline. Mucous membranes are dry.  Eyes: Conjunctivae are normal. No scleral icterus.  Neck: Normal range of motion. Neck supple.  Cardiovascular: Regular rhythm and intact distal pulses.   Occasional extrasystoles are present. Tachycardia present.   Pulmonary/Chest: Effort normal. She has no wheezes. She has no rales.  Abdominal: Soft. She exhibits no distension.  Neurological: She is alert. She is disoriented. She displays no atrophy and no tremor. She exhibits normal muscle tone. Coordination normal. GCS eye subscore is 4. GCS verbal subscore is 3. GCS motor subscore is 5.  Skin: Skin is warm. She is not diaphoretic.    ED Course  Procedures (including critical care time) Labs Review Labs Reviewed  CBC WITH DIFFERENTIAL - Abnormal; Notable for the following:    MCV 102.7 (*)    Platelets 139 (*)    All other components  within normal limits  BASIC METABOLIC PANEL - Abnormal; Notable for the following:    Sodium 163 (*)    Potassium 3.4 (*)    Chloride 123 (*)    Glucose, Bld 220 (*)    BUN 28 (*)    Creatinine, Ser 1.16 (*)    GFR calc non Af Amer 45 (*)    GFR calc Af Amer 52 (*)    All other components within normal limits  URINALYSIS, ROUTINE W REFLEX MICROSCOPIC  SODIUM, URINE, RANDOM    Imaging Review No results found.   EKG Interpretation   Date/Time:  Tuesday February 17 2014 10:01:26 EDT Ventricular Rate:  112 PR Interval:  125 QRS Duration: 90 QT Interval:  332 QTC  Calculation: 453 R Axis:   72 Text Interpretation:  Sinus tachycardia Probable LVH with secondary repol  abnrm ST depr, consider ischemia, inferior leads , probably repol abn  Anterior ST elevation, probably due to LVH Baseline wander in lead(s) V1  Abnormal ekg Confirmed by [facility] Center[facility]  MD, MICHEAL ([id number]) on 02/17/2014 10:25:21  AM      RA sat is 97% and I interpret to be adequate    12:13 PM Na is 163.  Will continue IVF's and consult triad for admission.    MDM   Final diagnoses:  Hypernatremia  Weakness  Hypovolemia dehydration    Pt is confused, altered, unclear from son's poor history whether pt is at baseline or not.  Pt's MM are very dry and I suspect pt is dehdyrated and has hypovolemic hypernatremia.  Will recheck labs, start IVF's and likely needs admission.      Gavin PoundMichael Y. Klark Vanderhoef, MD 02/17/14 1213

## 2014-02-18 DIAGNOSIS — R404 Transient alteration of awareness: Secondary | ICD-10-CM

## 2014-02-18 DIAGNOSIS — R4182 Altered mental status, unspecified: Secondary | ICD-10-CM

## 2014-02-18 DIAGNOSIS — N710 Acute inflammatory disease of uterus: Secondary | ICD-10-CM

## 2014-02-18 LAB — BASIC METABOLIC PANEL
BUN: 15 mg/dL (ref 6–23)
BUN: 14 mg/dL (ref 6–23)
BUN: 15 mg/dL (ref 6–23)
BUN: 14 mg/dL (ref 6–23)
BUN: 13 mg/dL (ref 6–23)
CALCIUM: 9.2 mg/dL (ref 8.4–10.5)
CALCIUM: 9 mg/dL (ref 8.4–10.5)
CALCIUM: 9 mg/dL (ref 8.4–10.5)
CALCIUM: 9.1 mg/dL (ref 8.4–10.5)
CHLORIDE: 126 meq/L — AB (ref 96–112)
CO2: 23 mEq/L (ref 19–32)
CO2: 24 mEq/L (ref 19–32)
CO2: 23 mEq/L (ref 19–32)
CO2: 19 mEq/L (ref 19–32)
CO2: 24 mEq/L (ref 19–32)
CREATININE: 0.98 mg/dL (ref 0.50–1.10)
CREATININE: 0.92 mg/dL (ref 0.50–1.10)
Calcium: 9.2 mg/dL (ref 8.4–10.5)
Chloride: 123 mEq/L — ABNORMAL HIGH (ref 96–112)
Chloride: 121 mEq/L — ABNORMAL HIGH (ref 96–112)
Chloride: 120 mEq/L — ABNORMAL HIGH (ref 96–112)
Creatinine, Ser: 0.87 mg/dL (ref 0.50–1.10)
Creatinine, Ser: 0.93 mg/dL (ref 0.50–1.10)
Creatinine, Ser: 0.91 mg/dL (ref 0.50–1.10)
GFR calc Af Amer: 68 mL/min — ABNORMAL LOW (ref >90)
GFR calc Af Amer: 69 mL/min — ABNORMAL LOW (ref >90)
GFR calc non Af Amer: 64 mL/min — ABNORMAL LOW (ref >90)
GFR calc non Af Amer: 59 mL/min — ABNORMAL LOW (ref >90)
GFR calc non Af Amer: 55 mL/min — ABNORMAL LOW (ref >90)
GFR, EST AFRICAN AMERICAN: 74 mL/min — AB (ref >90)
GFR, EST AFRICAN AMERICAN: 64 mL/min — AB (ref >90)
GFR, EST AFRICAN AMERICAN: 70 mL/min — AB (ref >90)
GFR, EST NON AFRICAN AMERICAN: 59 mL/min — AB (ref >90)
GFR, EST NON AFRICAN AMERICAN: 60 mL/min — AB (ref >90)
GLUCOSE: 101 mg/dL — AB (ref 70–99)
GLUCOSE: 113 mg/dL — AB (ref 70–99)
GLUCOSE: 78 mg/dL (ref 70–99)
Glucose, Bld: 166 mg/dL — ABNORMAL HIGH (ref 70–99)
Glucose, Bld: 172 mg/dL — ABNORMAL HIGH (ref 70–99)
POTASSIUM: 3.5 meq/L — AB (ref 3.7–5.3)
POTASSIUM: 3.4 meq/L — AB (ref 3.7–5.3)
POTASSIUM: 3.4 meq/L — AB (ref 3.7–5.3)
Potassium: 3.3 mEq/L — ABNORMAL LOW (ref 3.7–5.3)
Potassium: 4.2 mEq/L (ref 3.7–5.3)
SODIUM: 156 meq/L — AB (ref 137–147)
Sodium: 166 mEq/L (ref 137–147)
Sodium: 162 mEq/L — ABNORMAL HIGH (ref 137–147)
Sodium: 157 mEq/L — ABNORMAL HIGH (ref 137–147)
Sodium: 155 mEq/L — ABNORMAL HIGH (ref 137–147)

## 2014-02-18 LAB — CBC
HEMATOCRIT: 37.6 % (ref 36.0–46.0)
HEMOGLOBIN: 11.7 g/dL — AB (ref 12.0–15.0)
MCH: 31.3 pg (ref 26.0–34.0)
MCHC: 31.1 g/dL (ref 30.0–36.0)
MCV: 100.5 fL — AB (ref 78.0–100.0)
Platelets: 131 10*3/uL — ABNORMAL LOW (ref 150–400)
RBC: 3.74 MIL/uL — ABNORMAL LOW (ref 3.87–5.11)
RDW: 14 % (ref 11.5–15.5)
WBC: 8 10*3/uL (ref 4.0–10.5)

## 2014-02-18 LAB — GLUCOSE, CAPILLARY
Glucose-Capillary: 94 mg/dL (ref 70–99)
Glucose-Capillary: 174 mg/dL — ABNORMAL HIGH (ref 70–99)
Glucose-Capillary: 79 mg/dL (ref 70–99)
Glucose-Capillary: 165 mg/dL — ABNORMAL HIGH (ref 70–99)

## 2014-02-18 LAB — OSMOLALITY: Osmolality: 333 mOsm/kg — ABNORMAL HIGH (ref 275–300)

## 2014-02-18 LAB — HEMOGLOBIN A1C
Hgb A1c MFr Bld: 6.1 % — ABNORMAL HIGH (ref <5.7)
MEAN PLASMA GLUCOSE: 128 mg/dL — AB (ref <117)

## 2014-02-18 LAB — SODIUM: SODIUM: 163 meq/L — AB (ref 137–147)

## 2014-02-18 MED ORDER — DEXTROSE 5 % IV SOLN
INTRAVENOUS | Status: DC
  Administered 2014-02-18: 09:00:00 via INTRAVENOUS

## 2014-02-18 MED ORDER — DEXTROSE 5 % IV SOLN
INTRAVENOUS | Status: DC
  Administered 2014-02-18: 1000 mL via INTRAVENOUS
  Administered 2014-02-19: 100 mL via INTRAVENOUS
  Administered 2014-02-19: 1000 mL via INTRAVENOUS
  Administered 2014-02-20: 09:00:00 via INTRAVENOUS

## 2014-02-18 MED ORDER — SODIUM CHLORIDE 0.9 % IV SOLN: INTRAVENOUS | Status: DC

## 2014-02-18 MED ORDER — POTASSIUM CHLORIDE CRYS ER 20 MEQ PO TBCR
40.0000 meq | EXTENDED_RELEASE_TABLET | Freq: Once | ORAL | Status: AC
  Administered 2014-02-18: 40 meq via ORAL
  Filled 2014-02-18: qty 2

## 2014-02-18 NOTE — Progress Notes (Signed)
CRITICAL VALUE ALERT  Critical value received:  Sodium 163   Date of notification:  02/18/2014  Time of notification:  0945  Critical value read back:yes  Nurse who received alert:  Rhae Lernerortney D Sumayah Bearse   MD notified (1st page):  Algis DownsMarianne York  Time of first page:  0945   MD notified (2nd page):  Time of second page:  Responding MD:  Algis DownsMarianne York  Time MD responded:  [phone]

## 2014-02-18 NOTE — Consult Note (Signed)
[facility] Face-to-Face Psychiatry Consult   Reason for Consult:  Psychosis and alter mental status.   Referring Physician:  Dr Sabrina Long is an 78 y.o. female. Total Time spent with patient: 20 minutes  Assessment: AXIS I:  delirium AXIS II:  Deferred AXIS III:   Past Medical History  Diagnosis Date  . Unspecified constipation   . Anxiety state, unspecified   . Colitis, enteritis, and gastroenteritis of presumed infectious origin 11/02/2011  . Pneumonia, organism unspecified 11/02/2011  . Cystitis, unspecified   . Impetigo   . Renal artery stenosis   . Dementia in conditions classified elsewhere with behavioral disturbance   . Hypercalcemia   . Reflux esophagitis   . Chest pain, unspecified   . Abnormality of gait   . Hyperosmolality and/or hypernatremia   . Unspecified disorder of liver   . Candidiasis of vulva and vagina   . Alzheimer's disease   . Chest pain, unspecified 08/19/2008  . Dementia in conditions classified elsewhere without behavioral disturbance   . Depressive disorder, not elsewhere classified   . Esophageal reflux   . Other and unspecified hyperlipidemia   . Loss of weight   . Unspecified persistent mental disorders due to conditions classified elsewhere   . Reactive confusion   . Unspecified essential hypertension   . Other seborrheic keratosis   . Osteoarthrosis, unspecified whether generalized or localized, unspecified site   . Stiffness of joint, not elsewhere classified, unspecified site   . Memory loss 07/24/2007  . Headache(784.0)   . Undiagnosed cardiac murmurs    AXIS IV:  other psychosocial or environmental problems and problems related to social environment AXIS V:  31-40 impairment in reality testing  Plan:  Medication management  Subjective:   Sabrina Long is a 78 y.o. female patient admitted with altered mental status.  HPI:  Patient seen chart reviewed.  Patient is a 78 year old Afro-American female who has  history of dementia with behavioral disturbance.  She is admitted to the hospital because of altered mental status, failure to thrive, functional decline, lethargy and unable to take care of herself.  As per chart patient has been on Risperdal before for her behavioral changes and recently it was changed to Seroquel because his brother was making her very lethargic and tired.  Patient is difficult to talk.  She was unable to provide any relevant information.  She was unable to open her eyes.  She was noticed mumbling but incoherent.  There is no documented history of previous psychiatric hospitalization.  Patient is difficult to engage in conversation.  Past Psychiatric History: Past Medical History  Diagnosis Date  . Unspecified constipation   . Anxiety state, unspecified   . Colitis, enteritis, and gastroenteritis of presumed infectious origin 11/02/2011  . Pneumonia, organism unspecified 11/02/2011  . Cystitis, unspecified   . Impetigo   . Renal artery stenosis   . Dementia in conditions classified elsewhere with behavioral disturbance   . Hypercalcemia   . Reflux esophagitis   . Chest pain, unspecified   . Abnormality of gait   . Hyperosmolality and/or hypernatremia   . Unspecified disorder of liver   . Candidiasis of vulva and vagina   . Alzheimer's disease   . Chest pain, unspecified 08/19/2008  . Dementia in conditions classified elsewhere without behavioral disturbance   . Depressive disorder, not elsewhere classified   . Esophageal reflux   . Other and unspecified hyperlipidemia   . Loss of weight   . Unspecified persistent  mental disorders due to conditions classified elsewhere   . Reactive confusion   . Unspecified essential hypertension   . Other seborrheic keratosis   . Osteoarthrosis, unspecified whether generalized or localized, unspecified site   . Stiffness of joint, not elsewhere classified, unspecified site   . Memory loss 07/24/2007  . Headache(784.0)   .  Undiagnosed cardiac murmurs     reports that she has never smoked. She has never used smokeless tobacco. She reports that she does not drink alcohol or use illicit drugs. History reviewed. No pertinent family history.   Living Arrangements: Children     Allergies:   Allergies  Allergen Reactions  . Risperdal [Risperidone] Other (See Comments)    sedation    ACT Assessment Complete:  Yes:    Educational Status    Risk to Self: Risk to self Is patient at risk for suicide?: No Substance abuse history and/or treatment for substance abuse?: No  Risk to Others:    Abuse:    Prior Inpatient Therapy:    Prior Outpatient Therapy:    Additional Information:                    Objective: Blood pressure 120/69, pulse 82, temperature 99 F (37.2 C), temperature source Oral, resp. rate 18, height 5' 4"  (1.626 m), weight 135 lb 8 oz (61.462 kg), SpO2 98.00%.Body mass index is 23.25 kg/(m^2). Results for orders placed during the hospital encounter of 02/17/14 (from the past 72 hour(s))  CBC WITH DIFFERENTIAL     Status: Abnormal   Collection Time    02/17/14 10:47 AM      Result Value Ref Range   WBC 8.7  4.0 - 10.5 K/uL   RBC 4.41  3.87 - 5.11 MIL/uL   Hemoglobin 13.8  12.0 - 15.0 g/dL   HCT 45.3  36.0 - 46.0 %   MCV 102.7 (*) 78.0 - 100.0 fL   MCH 31.3  26.0 - 34.0 pg   MCHC 30.5  30.0 - 36.0 g/dL   RDW 13.9  11.5 - 15.5 %   Platelets 139 (*) 150 - 400 K/uL   Neutrophils Relative % 66  43 - 77 %   Neutro Abs 5.7  1.7 - 7.7 K/uL   Lymphocytes Relative 28  12 - 46 %   Lymphs Abs 2.5  0.7 - 4.0 K/uL   Monocytes Relative 6  3 - 12 %   Monocytes Absolute 0.5  0.1 - 1.0 K/uL   Eosinophils Relative 0  0 - 5 %   Eosinophils Absolute 0.0  0.0 - 0.7 K/uL   Basophils Relative 0  0 - 1 %   Basophils Absolute 0.0  0.0 - 0.1 K/uL  BASIC METABOLIC PANEL     Status: Abnormal   Collection Time    02/17/14 10:47 AM      Result Value Ref Range   Sodium 163 (*) 137 - 147 mEq/L    Comment: CRITICAL RESULT CALLED TO, READ BACK BY AND VERIFIED WITH:     STEELE H RN 02/17/14 1149 COSTELLO B   Potassium 3.4 (*) 3.7 - 5.3 mEq/L   Chloride 123 (*) 96 - 112 mEq/L   CO2 26  19 - 32 mEq/L   Glucose, Bld 220 (*) 70 - 99 mg/dL   BUN 28 (*) 6 - 23 mg/dL   Creatinine, Ser 1.16 (*) 0.50 - 1.10 mg/dL   Calcium 10.1  8.4 - 10.5 mg/dL   GFR calc non  Af Amer 45 (*) >90 mL/min   GFR calc Af Amer 52 (*) >90 mL/min   Comment: (NOTE)     The eGFR has been calculated using the CKD EPI equation.     This calculation has not been validated in all clinical situations.     eGFR's persistently <90 mL/min signify possible Chronic Kidney     Disease.  URINALYSIS, ROUTINE W REFLEX MICROSCOPIC     Status: Abnormal   Collection Time    02/17/14  1:13 PM      Result Value Ref Range   Color, Urine YELLOW  YELLOW   APPearance CLOUDY (*) CLEAR   Specific Gravity, Urine 1.026  1.005 - 1.030   pH 5.0  5.0 - 8.0   Glucose, UA NEGATIVE  NEGATIVE mg/dL   Hgb urine dipstick TRACE (*) NEGATIVE   Bilirubin Urine NEGATIVE  NEGATIVE   Ketones, ur NEGATIVE  NEGATIVE mg/dL   Protein, ur 100 (*) NEGATIVE mg/dL   Urobilinogen, UA 1.0  0.0 - 1.0 mg/dL   Nitrite NEGATIVE  NEGATIVE   Leukocytes, UA MODERATE (*) NEGATIVE  SODIUM, URINE, RANDOM     Status: None   Collection Time    02/17/14  1:13 PM      Result Value Ref Range   Sodium, Ur 70    URINE MICROSCOPIC-ADD ON     Status: Abnormal   Collection Time    02/17/14  1:13 PM      Result Value Ref Range   Squamous Epithelial / LPF MANY (*) RARE   WBC, UA 11-20  <3 WBC/hpf   RBC / HPF 3-6  <3 RBC/hpf   Bacteria, UA MANY (*) RARE   Casts HYALINE CASTS (*) NEGATIVE   Comment: GRANULAR CAST   Crystals URIC ACID CRYSTALS (*) NEGATIVE   Urine-Other MUCOUS PRESENT    HEMOGLOBIN A1C     Status: Abnormal   Collection Time    02/17/14  4:15 PM      Result Value Ref Range   Hemoglobin A1C 6.1 (*) <5.7 %   Comment: (NOTE)                                                                                According to the ADA Clinical Practice Recommendations for 2011, when     HbA1c is used as a screening test:      >=6.5%   Diagnostic of Diabetes Mellitus               (if abnormal result is confirmed)     5.7-6.4%   Increased risk of developing Diabetes Mellitus     References:Diagnosis and Classification of Diabetes Mellitus,Diabetes     ASTM,1962,22(LNLGX 1):S62-S69 and Standards of Medical Care in             Diabetes - 2011,Diabetes Care,2011,34 (Suppl 1):S11-S61.   Mean Plasma Glucose 128 (*) <117 mg/dL   Comment: Performed at [place], [place]     Status: None   Collection Time    02/17/14  6:33 PM      Result Value Ref Range   Glucose-Capillary 96  70 - 99 mg/dL  MRSA PCR  SCREENING     Status: None   Collection Time    02/17/14  7:04 PM      Result Value Ref Range   MRSA by PCR NEGATIVE  NEGATIVE   Comment:            The GeneXpert MRSA Assay (FDA     approved for NASAL specimens     only), is one component of a     comprehensive MRSA colonization     surveillance program. It is not     intended to diagnose MRSA     infection nor to guide or     monitor treatment for     MRSA infections.  AMMONIA     Status: None   Collection Time    02/17/14  7:35 PM      Result Value Ref Range   Ammonia 54  11 - 60 umol/L  TSH     Status: None   Collection Time    02/17/14  7:35 PM      Result Value Ref Range   TSH 2.330  0.350 - 4.500 uIU/mL   Comment: Please note change in reference range.  GLUCOSE, CAPILL[facility]     Status: Abnormal   Collection Time    02/17/14 10:16 PM      Result Value Ref Range   Glucose-Capillary 111 (*) 70 - 99 mg/dL   Comment 1 Notify RN     Comment 2 Documented in Chart    BASIC METABOLIC PANEL     Status: Abnormal   Collection Time    02/18/14  4:44 AM      Result Value Ref Range   Sodium 166 (*) 137 - 147 mEq/L   Comment: CRITICAL RESULT CALLED TO, READ BACK BY AND  VERIFIED WITH:     YNOC,L RN 04.29.2015 0626 JORDANS     CRITICAL VALUE NOTED.  VALUE IS CONSISTENT WITH PREVIOUSLY REPORTED AND CALLED VALUE.   Potassium 3.5 (*) 3.7 - 5.3 mEq/L   Chloride >130 (*) 96 - 112 mEq/L   Comment: CRITICAL RESULT CALLED TO, READ BACK BY AND VERIFIED WITH:     YNOC,L RN 02/18/2014 0626 JORDANS     REPEATED TO VERIFY   CO2 23  19 - 32 mEq/L   Glucose, Bld 101 (*) 70 - 99 mg/dL   BUN 15  6 - 23 mg/dL   Creatinine, Ser 0.87  0.50 - 1.10 mg/dL   Calcium 9.2  8.4 - 10.5 mg/dL   GFR calc non Af Amer 64 (*) >90 mL/min   GFR calc Af Amer 74 (*) >90 mL/min   Comment: (NOTE)     The eGFR has been calculated using the CKD EPI equation.     This calculation has not been validated in all clinical situations.     eGFR's persistently <90 mL/min signify possible Chronic Kidney     Disease.  CBC     Status: Abnormal   Collection Time    02/18/14  4:44 AM      Result Value Ref Range   WBC 8.0  4.0 - 10.5 K/uL   RBC 3.74 (*) 3.87 - 5.11 MIL/uL   Hemoglobin 11.7 (*) 12.0 - 15.0 g/dL   HCT 37.6  36.0 - 46.0 %   MCV 100.5 (*) 78.0 - 100.0 fL   MCH 31.3  26.0 - 34.0 pg   MCHC 31.1  30.0 - 36.0 g/dL   RDW 14.0  11.5 - 15.5 %  Platelets 131 (*) 150 - 400 K/uL  GLUCOSE, CAPILLARY     Status: None   Collection Time    02/18/14  8:13 AM      Result Value Ref Range   Glucose-Capillary 94  70 - 99 mg/dL  BASIC METABOLIC PANEL     Status: Abnormal   Collection Time    02/18/14  8:56 AM      Result Value Ref Range   Sodium 162 (*) 137 - 147 mEq/L   Potassium 3.4 (*) 3.7 - 5.3 mEq/L   Chloride 126 (*) 96 - 112 mEq/L   CO2 24  19 - 32 mEq/L   Glucose, Bld 113 (*) 70 - 99 mg/dL   BUN 14  6 - 23 mg/dL   Creatinine, Ser 0.93  0.50 - 1.10 mg/dL   Calcium 9.2  8.4 - 10.5 mg/dL   GFR calc non Af Amer 59 (*) >90 mL/min   GFR calc Af Amer 68 (*) >90 mL/min   Comment: (NOTE)     The eGFR has been calculated using the CKD EPI equation.     This calculation has not been validated  in all clinical situations.     eGFR's persistently <90 mL/min signify possible Chronic Kidney     Disease.  SODIUM     Status: Abnormal   Collection Time    02/18/14  8:56 AM      Result Value Ref Range   Sodium 163 (*) 137 - 147 mEq/L   Comment: CRITICAL RESULT CALLED TO, READ BACK BY AND VERIFIED WITH:     Ronda Fairly [id number] 02/18/14 WBOND  OSMOLALITY     Status: Abnormal   Collection Time    02/18/14  9:55 AM      Result Value Ref Range   Osmolality 333 (*) 275 - 300 mOsm/kg   Comment: Performed at [place], [place]     Status: Abnormal   Collection Time    02/18/14 12:36 PM      Result Value Ref Range   Glucose-Capillary 174 (*) 70 - 99 mg/dL  BASIC METABOLIC PANEL     Status: Abnormal   Collection Time    02/18/14 12:50 PM      Result Value Ref Range   Sodium 157 (*) 137 - 147 mEq/L   Potassium 3.3 (*) 3.7 - 5.3 mEq/L   Chloride 123 (*) 96 - 112 mEq/L   CO2 23  19 - 32 mEq/L   Glucose, Bld 166 (*) 70 - 99 mg/dL   BUN 15  6 - 23 mg/dL   Creatinine, Ser 0.98  0.50 - 1.10 mg/dL   Calcium 9.0  8.4 - 10.5 mg/dL   GFR calc non Af Amer 55 (*) >90 mL/min   GFR calc Af Amer 64 (*) >90 mL/min   Comment: (NOTE)     The eGFR has been calculated using the CKD EPI equation.     This calculation has not been validated in all clinical situations.     eGFR's persistently <90 mL/min signify possible Chronic Kidney     Disease.  GLUCOSE, CAPILLARY     Status: None   Collection Time    02/18/14  5:23 PM      Result Value Ref Range   Glucose-Capillary 79  70 - 99 mg/dL   Labs are reviewed and are pertinent for high sodium level.  Current Facility-Administered Medications  Medication Dose Route Frequency Provider Last Rate Last Dose  .  acetaminophen (TYLENOL) tablet 650 mg  650 mg Oral Q6H PRN Kelvin Cellar, MD       Or  . acetaminophen (TYLENOL) suppository 650 mg  650 mg Rectal Q6H PRN Kelvin Cellar, MD      . alum & mag hydroxide-simeth (MAALOX/MYLANTA)  200-200-20 MG/5ML suspension 30 mL  30 mL Oral Q6H PRN Kelvin Cellar, MD      . cefTRIAXone (ROCEPHIN) 1 g in dextrose 5 % 50 mL IVPB  1 g Intravenous Q24H Kelvin Cellar, MD   1 g at 02/18/14 1748  . dextrose 5 % solution   Intravenous Continuous Melton Alar, PA-C 50 mL/hr at 02/18/14 1752 1,000 mL at 02/18/14 1752  . heparin injection 5,000 Units  5,000 Units Subcutaneous 3 times per day Kelvin Cellar, MD   5,000 Units at 02/18/14 1328  . hydrALAZINE (APRESOLINE) injection 10 mg  10 mg Intravenous Q4H PRN Kelvin Cellar, MD   10 mg at 02/17/14 2206  . insulin aspart (novoLOG) injection 0-15 Units  0-15 Units Subcutaneous TID WC Kelvin Cellar, MD   3 Units at 02/18/14 1329  . insulin aspart (novoLOG) injection 0-5 Units  0-5 Units Subcutaneous QHS Kelvin Cellar, MD      . ondansetron [facility]) tablet 4 mg  4 mg Oral Q6H PRN Kelvin Cellar, MD       Or  . ondansetron (ZOFRAN) injection 4 mg  4 mg Intravenous Q6H PRN Kelvin Cellar, MD      . sodium chloride 0.9 % injection 3 mL  3 mL Intravenous Q12H Kelvin Cellar, MD   3 mL at 02/17/14 2206  . sorbitol 70 % solution 30 mL  30 mL Oral Daily PRN Kelvin Cellar, MD        Psychiatric Specialty Exam:     Blood pressure 120/69, pulse 82, temperature 99 F (37.2 C), temperature source Oral, resp. rate 18, height 5' 4"  (1.626 m), weight 135 lb 8 oz (61.462 kg), SpO2 98.00%.Body mass index is 23.25 kg/(m^2).  General Appearance: Difficult to arouse  Eye Contact::  Poor  Speech:  Slurred  Volume:  Decreased  Mood:  NA  Affect:  Inappropriate  Thought Process:  Incoherent thoughts  Orientation:  Negative  Thought Content:  Confused and disoriented  Suicidal Thoughts:  No  Homicidal Thoughts:  No  Memory:  Confused  Judgement:  Impaired  Insight:  Lacking  Psychomotor Activity:  Decreased  Concentration:  Poor  Recall:  Poor  Fund of Knowledge:Poor  Language: Poor  Akathisia:  No  Handed:  Right  AIMS (if indicated):      Assets:  Social Support  Sleep:      Musculoskeletal: Strength & Muscle Tone: Unable to assess Gait & Station: Unable to assess Patient leans: N/A  Treatment Plan Summary: Medication management, at this time hold antipsychotic medication since patient is not having any behavioral problem.  Use when necessary low dose Haldol 0.5 mg if patient is very agitated or aggressive.  Patient has delirium due to metabolic reason.  Treat underlying cause.  Please call [phone] if you have any further questions.  Travonna Swindle T Deamonte Sayegh 02/18/2014 6:00 PM

## 2014-02-18 NOTE — Progress Notes (Addendum)
[facility] PROGRESS NOTE  Sabrina Long Sabrina Long DOB: 08-18-1936 DOA: 02/17/2014 PCP: Bufford SpikesEED, [name], DO HPI 78 year old female with a history of dementia w/ behavioral disturbances.  Presented to ED with son with complaints of  Mental status change, failure to thrive, lethargy and minimal intake by mouth. She was on Risperdal therapy for behavorial changes and was switched to Seroquel at bedtime prn psychosis.   Lab work in ER showed NA levels at 163.   Admitted for hypernatremia, UTI, and mental status change.  Subj  Patient is not making any sense this morning, unable to ansewer questions, incoherent mumbling. Per family this her baseline.  Assessment/Plan: Hypernatremia  Prob caused by low intake  On IVF fluids, adjusting from NS to D5W as needed based on sodium level - being careful to avoid overly rapid   Na levels - highest 166 on 4/29 - most recent 157  Continue fluid hydration and q4 NA monitoring  Pending urine Osmol and urine NA  UTI  UA on 4/28 indicated possible infection  On rocephin abx  Culture pending  Dementia with behavioral disturbances  History of dementia with behavorial disturbances  Holding all psych medications till NA levels normalize  Patient was trying to take out her IV and appeared to try to get up,   Will consult Psych once NA levels normalize for recommendation for treatment options.  Consider restraints vs sitter in the future  Acute Encephalopathy   Most likely caused by the hypernatremia and UTI  Code Status: Full Family Communication: none Disposition Plan: inpatient   Consultants:  none  Procedures:  none  Antibiotics:  rocephin   Objective: Filed Vitals:   02/18/14 1437  BP: 120/69  Pulse: 82  Temp: 99 F (37.2 C)  Resp: 18    Intake/Output Summary (Last 24 hours) at 02/18/14 1445 Last data filed at 02/17/14 2100  Gross per 24 hour  Intake    200 ml  Output      0 ml  Net    200 ml    Filed Weights   02/17/14 1004 02/17/14 1404  Weight: 65.318 kg (144 lb) 61.462 kg (135 lb 8 oz)    Exam:  Physical Exam  Constitutional: She appears confused, no acute distress HENT:   Head: Normocephalic and atraumatic.   Eyes: Pupils are equal, round, and reactive to light.  Neck: No JVD present.  Cardiovascular: Normal rate, regular rhythm and normal heart sounds.   Respiratory: Effort normal and breath sounds normal. No stridor. No respiratory distress. She has no wheezes. She exhibits no tenderness.  Musculoskeletal: Normal range of motion. She exhibits no tenderness.  Skin: Skin is warm and dry. She is not diaphoretic. No erythema.  Psychiatric: She is unable to ansewer basic question, muttering incoherent words, possible visual hallucination- patient appeared to be grabbing at flies around her head.  Data Reviewed: Basic Metabolic Panel:  Recent Labs Lab 02/16/14 1416 02/17/14 1047 02/18/14 0444 02/18/14 0856 02/18/14 1250  NA 163* 163* 166* 162*  163* 157*  K 3.8 3.4* 3.5* 3.4* 3.3*  CL 122* 123* >130* 126* 123*  CO2 23 26 23 24 23   GLUCOSE 116* 220* 101* 113* 166*  BUN 30* 28* 15 14 15   CREATININE 1.63* 1.16* 0.87 0.93 0.98  CALCIUM 10.0 10.1 9.2 9.2 9.0   Liver Function Tests:    Recent Labs Lab 02/17/14 1935  AMMONIA 54   CBC:  Recent Labs Lab 02/17/14 1047 02/18/14 0444  WBC 8.7  8.0  NEUTROABS 5.7  --   HGB 13.8 11.7*  HCT 45.3 37.6  MCV 102.7* 100.5*  PLT 139* 131*   BNP (last 3 results)  Recent Labs  05/03/13 0500  PROBNP 440.8*   CBG:  Recent Labs Lab 02/17/14 1833 02/17/14 2216 02/18/14 0813 02/18/14 1236  GLUCAP 96 111* 94 174*    Recent Results (from the past 240 hour(s))  MRSA PCR SCREENING     Status: None   Collection Time    02/17/14  7:04 PM      Result Value Ref Range Status   MRSA by PCR NEGATIVE  NEGATIVE Final   Comment:            The GeneXpert MRSA Assay (FDA     approved for NASAL specimens      only), is one component of a     comprehensive MRSA colonization     surveillance program. It is not     intended to diagnose MRSA     infection nor to guide or     monitor treatment for     MRSA infections.     Studies: No results found.  Scheduled Meds: . cefTRIAXone (ROCEPHIN)  IV  1 g Intravenous Q24H  . heparin  5,000 Units Subcutaneous 3 times per day  . insulin aspart  0-15 Units Subcutaneous TID WC  . insulin aspart  0-5 Units Subcutaneous QHS  . potassium chloride  40 mEq Oral Once  . sodium chloride  3 mL Intravenous Q12H   Continuous Infusions: . sodium chloride      Principal Problem:   Hypernatremia Active Problems:   Dementia with behavioral disturbance   FTT (failure to thrive) in adult   Acute encephalopathy   UTI (urinary tract infection)    Time spent: [address]    Gary Alan Roberts GaudyBenda, Student-PA Marianne L [place], [place]-C [phone]     [facility]  If 7PM-7AM, please contact night-coverage at [email or web address], password [email or web address] 02/18/2014, 2:45 PM  LOS: 1 day     Addendum  Patient seen and examined, chart and data base reviewed.  I agree with the above assessment and plan.  For full details please see Mr. Zetta BillsGary Alan Benda, Student-PA note.   Clint LippsMutaz A Keishla Oyer, MD [facility] Pager: [phone] 02/18/2014, 2:50 PM

## 2014-02-19 DIAGNOSIS — E785 Hyperlipidemia, unspecified: Secondary | ICD-10-CM

## 2014-02-19 LAB — GLUCOSE, CAPILLARY
GLUCOSE-CAPILLARY: 112 mg/dL — AB (ref 70–99)
Glucose-Capillary: 126 mg/dL — ABNORMAL HIGH (ref 70–99)
Glucose-Capillary: 165 mg/dL — ABNORMAL HIGH (ref 70–99)
Glucose-Capillary: 170 mg/dL — ABNORMAL HIGH (ref 70–99)

## 2014-02-19 LAB — BASIC METABOLIC PANEL
BUN: 13 mg/dL (ref 6–23)
BUN: 10 mg/dL (ref 6–23)
BUN: 10 mg/dL (ref 6–23)
CALCIUM: 9.4 mg/dL (ref 8.4–10.5)
CO2: 21 meq/L (ref 19–32)
CO2: 24 meq/L (ref 19–32)
CO2: 22 mEq/L (ref 19–32)
CREATININE: 0.9 mg/dL (ref 0.50–1.10)
Calcium: 9.4 mg/dL (ref 8.4–10.5)
Calcium: 9.7 mg/dL (ref 8.4–10.5)
Chloride: 118 mEq/L — ABNORMAL HIGH (ref 96–112)
Chloride: 115 mEq/L — ABNORMAL HIGH (ref 96–112)
Chloride: 115 mEq/L — ABNORMAL HIGH (ref 96–112)
Creatinine, Ser: 0.94 mg/dL (ref 0.50–1.10)
Creatinine, Ser: 0.89 mg/dL (ref 0.50–1.10)
GFR calc Af Amer: 67 mL/min — ABNORMAL LOW (ref >90)
GFR calc Af Amer: 71 mL/min — ABNORMAL LOW (ref >90)
GFR calc Af Amer: 72 mL/min — ABNORMAL LOW (ref >90)
GFR calc non Af Amer: 61 mL/min — ABNORMAL LOW (ref >90)
GFR, EST NON AFRICAN AMERICAN: 58 mL/min — AB (ref >90)
GFR, EST NON AFRICAN AMERICAN: 62 mL/min — AB (ref >90)
GLUCOSE: 138 mg/dL — AB (ref 70–99)
Glucose, Bld: 95 mg/dL (ref 70–99)
Glucose, Bld: 133 mg/dL — ABNORMAL HIGH (ref 70–99)
POTASSIUM: 4.4 meq/L (ref 3.7–5.3)
Potassium: 3.4 mEq/L — ABNORMAL LOW (ref 3.7–5.3)
Potassium: 3.5 mEq/L — ABNORMAL LOW (ref 3.7–5.3)
SODIUM: 153 meq/L — AB (ref 137–147)
Sodium: 155 mEq/L — ABNORMAL HIGH (ref 137–147)
Sodium: 152 mEq/L — ABNORMAL HIGH (ref 137–147)

## 2014-02-19 LAB — OSMOLALITY, URINE: OSMOLALITY UR: 818 mosm/kg (ref 390–1090)

## 2014-02-19 MED ORDER — HYDRALAZINE HCL 50 MG PO TABS
50.0000 mg | ORAL_TABLET | Freq: Three times a day (TID) | ORAL | Status: DC
  Filled 2014-02-19 (×2): qty 1

## 2014-02-19 MED ORDER — MAGNESIUM SULFATE 40 MG/ML IJ SOLN
2.0000 g | Freq: Once | INTRAMUSCULAR | Status: AC
  Administered 2014-02-19: 2 g via INTRAVENOUS
  Filled 2014-02-19: qty 50

## 2014-02-19 MED ORDER — METOPROLOL TARTRATE 12.5 MG HALF TABLET
12.5000 mg | ORAL_TABLET | Freq: Two times a day (BID) | ORAL | Status: DC
  Filled 2014-02-19 (×2): qty 1

## 2014-02-19 MED ORDER — ATORVASTATIN CALCIUM 80 MG PO TABS: 80.0000 mg | ORAL_TABLET | Freq: Every day | ORAL | Status: DC

## 2014-02-19 MED ORDER — ALPRAZOLAM 0.5 MG PO TABS: 0.5000 mg | ORAL_TABLET | Freq: Three times a day (TID) | ORAL | Status: DC | PRN

## 2014-02-19 MED ORDER — MEMANTINE HCL 10 MG PO TABS
10.0000 mg | ORAL_TABLET | Freq: Two times a day (BID) | ORAL | Status: DC
  Administered 2014-02-19 – 2014-02-20 (×3): 10 mg via ORAL
  Filled 2014-02-19 (×4): qty 1

## 2014-02-19 MED ORDER — METOPROLOL TARTRATE 12.5 MG HALF TABLET
12.5000 mg | ORAL_TABLET | Freq: Two times a day (BID) | ORAL | Status: DC
  Administered 2014-02-20: 12.5 mg via ORAL
  Filled 2014-02-19 (×2): qty 1

## 2014-02-19 MED ORDER — METOPROLOL TARTRATE 1 MG/ML IV SOLN
5.0000 mg | INTRAVENOUS | Status: AC
  Administered 2014-02-19: 5 mg via INTRAVENOUS
  Filled 2014-02-19: qty 5

## 2014-02-19 MED ORDER — ATORVASTATIN CALCIUM 20 MG PO TABS
20.0000 mg | ORAL_TABLET | Freq: Every day | ORAL | Status: DC
  Administered 2014-02-19: 20 mg via ORAL
  Filled 2014-02-19 (×2): qty 1

## 2014-02-19 MED ORDER — DICLOFENAC SODIUM 1 % TD GEL
4.0000 g | Freq: Four times a day (QID) | TRANSDERMAL | Status: DC | PRN
  Filled 2014-02-19: qty 100

## 2014-02-19 MED ORDER — LOSARTAN POTASSIUM 25 MG PO TABS
25.0000 mg | ORAL_TABLET | Freq: Every day | ORAL | Status: DC
  Administered 2014-02-20: 25 mg via ORAL
  Filled 2014-02-19: qty 1

## 2014-02-19 MED ORDER — PANTOPRAZOLE SODIUM 40 MG PO TBEC
40.0000 mg | DELAYED_RELEASE_TABLET | Freq: Every day | ORAL | Status: DC
  Administered 2014-02-19 – 2014-02-20 (×2): 40 mg via ORAL
  Filled 2014-02-19 (×3): qty 1

## 2014-02-19 MED ORDER — METOPROLOL TARTRATE 12.5 MG HALF TABLET
12.5000 mg | ORAL_TABLET | Freq: Two times a day (BID) | ORAL | Status: DC
  Filled 2014-02-19: qty 1

## 2014-02-19 MED ORDER — SERTRALINE HCL 100 MG PO TABS
100.0000 mg | ORAL_TABLET | Freq: Every day | ORAL | Status: DC
  Administered 2014-02-19 – 2014-02-20 (×2): 100 mg via ORAL
  Filled 2014-02-19 (×3): qty 1

## 2014-02-19 MED ORDER — HYDRALAZINE HCL 50 MG PO TABS
50.0000 mg | ORAL_TABLET | Freq: Three times a day (TID) | ORAL | Status: DC
  Administered 2014-02-19 – 2014-02-20 (×3): 50 mg via ORAL
  Filled 2014-02-19 (×4): qty 1

## 2014-02-19 MED ORDER — NITROGLYCERIN 0.4 MG SL SUBL: 0.4000 mg | SUBLINGUAL_TABLET | SUBLINGUAL | Status: DC | PRN

## 2014-02-19 MED ORDER — LOSARTAN POTASSIUM 25 MG PO TABS
25.0000 mg | ORAL_TABLET | Freq: Every day | ORAL | Status: DC
  Administered 2014-02-19: 25 mg via ORAL
  Filled 2014-02-19: qty 1

## 2014-02-19 NOTE — Progress Notes (Signed)
Clinical Social Work Department BRIEF PSYCHOSOCIAL ASSESSMENT 02/19/2014  Patient:  Consuello ClossMORRISON,Janina P     Account Number:  [id number]     Admit date:  02/17/2014  Clinical Social Worker:  Harless NakayamaAMBELAL,Mirakle Tomlin, LCSWA  Date/Time:  02/19/2014 10:15 AM  Referred by:  Physician  Date Referred:  02/19/2014 Referred for  SNF Placement   Other Referral:   Interview type:  Family Other interview type:   Spoke with pt daughter at bedside    PSYCHOSOCIAL DATA Living Status:  WITH ADULT CHILDREN Admitted from facility:   Level of care:   Primary support name:  Willette Almarlene Dumire [phone] Primary support relationship to patient:  CHILD, ADULT Degree of support available:   Pt has very supportive family    CURRENT CONCERNS Current Concerns  Post-Acute Placement   Other Concerns:    SOCIAL WORK ASSESSMENT / PLAN CSW informed pt daughter asking to speak about SNF. CSW visited pt room and spoke with pt daughter. Pt did not participate in conversation and pt daughter stated that pt does not speak and when she does she is unaware of what she is saying. Pt daughter informed CSW that pt lives with her son but at dc they would like for her to receive ST rehab. Pt daughter familiar with some facilities and [facility]. CSW explained SNF referal process and pt daughter agreeable to [place] [facility] [facility] [place] [facility] [place] up. Pt daughter thankful for CSW visit and believes SNF will be best for pt.   Assessment/plan status:  Psychosocial Support/Ongoing Assessment of Needs Other assessment/ plan:   Information/referral to community resources:   SNF list to be provided with bed offers    PATIENT'S/FAMILY'S RESPONSE TO PLAN OF CARE: Pt daughter wanting SNF       Sharol Harnessoonum Clebert Wenger, LCSWA [phone]

## 2014-02-19 NOTE — Progress Notes (Addendum)
[facility] PROGRESS NOTE  Sabrina Long BJY:[id number] DOB: 09-21-1936 DOA: 02/17/2014 PCP: Bufford SpikesEED, [name], DO  HPI  78 year old female with a history of dementia w/ behavioral disturbances. Presented to ED with son with complaints of Mental status change, failure to thrive, lethargy and minimal intake by mouth. She was on Risperdal therapy for behavorial changes and was switched to Seroquel at bedtime prn psychosis. Lab work in ER showed NA levels at 163. Admitted for hypernatremia, UTI, and mental status change.   Subj  Patient has improved since yesterday.  Was able to ansewer a few basic questions such as her name and where she is.  Her speech is now fully formed words with meaning.  Daughter was in the room this morning.  She reports patient is at baseline for her mental capacity.   Assessment/Plan:   Hypernatremia  Prob caused by low intake - possibly exacerbated by seroquel causing sleepiness. On IVF fluids, adjusting D5W rate based on desired rate of sodium correction,  Currently 100 ml/hr Na levels - highest 166 on 4/29 - most recent 152 @ 0100 Urine osmol and NA normal Continue fluid hydration and q4 NA monitoring   UTI  UA on 4/28 indicated possible infection  4/28-culture pending On rocephin abx   Dementia History of dementia Psych recommended continuing to hold all psych medications till NA levels normalize  Psych evaulate recommended low dose Haldol if patient becomes agitated. Patient has improved, according to daughter, she is at baseline Restarted all home meds except Seroquel  PT consulted - recommending SNF  Acute Encephalopathy  Most likely caused by the hypernatremia and UTI in the setting of baseline dementia.  Tachycardia  Patient's heart rate runs into the 120-140 s at times  She is on lopressor at home, which was initially held on admission.  Lopressor 5 mg IV this morning for rate and bp control  Reordered all home  meds  Anxiety/agitation  Patient is on Xanax at home  Reordered Xanax prn at needed   Hypokalemia  Repleted.  Supplemented Magnesium as well.  Thrombocytopenia  Acute drop compared to 3 weeks ago  Likely an acute phase reactant.  Code Status: Full  Family Communication: Daughter at bedside Disposition Plan: Discharge tomorrow to SNF if bed available and sodium normalized.  Consultants:  Psychiatry. Procedures:  none Antibiotics:  rocephin  Objective: Filed Vitals:   02/19/14 0722  BP: 153/73  Pulse:   Temp:   Resp:     Intake/Output Summary (Last 24 hours) at 02/19/14 1135 Last data filed at 02/19/14 0915  Gross per 24 hour  Intake    340 ml  Output      0 ml  Net    340 ml   Filed Weights   02/17/14 1004 02/17/14 1404  Weight: 65.318 kg (144 lb) 61.462 kg (135 lb 8 oz)    Exam:  Physical Exam  Constitutional: She is oriented to person, place, She appears well-developed and well-nourished. No distress.  HENT:   Head: Normocephalic and atraumatic.   Eyes: Pupils are equal, round, and reactive to light.  Neck: No JVD present.  Cardiovascular: Normal rate, regular rhythm and normal heart sounds.   Respiratory: Effort normal and breath sounds normal. No stridor. No respiratory distress. She has no wheezes. She exhibits no tenderness.  GI: Soft. Bowel sounds are normal. She exhibits no distension. There is no tenderness. There is no rebound.  Musculoskeletal: Normal range of motion. She exhibits no tenderness.  Neurological:  She is alert and oriented to person, place, and time.  Skin: Skin is warm and dry. She is not diaphoretic. No erythema.   Data Reviewed: Basic Metabolic Panel:  Recent Labs Lab 02/18/14 1250 02/18/14 1730 02/18/14 2125 02/19/14 0150 02/19/14 0656  NA 157* 155* 156* 155* 152*  K 3.3* 4.2 3.4* 4.4 3.4*  CL 123* 121* 120* 118* 115*  CO2 23 19 24 21 24   GLUCOSE 166* 78 172* 95 133*  BUN 15 14 13 13 10   CREATININE 0.98 0.92  0.91 0.94 0.90  CALCIUM 9.0 9.0 9.1 9.4 9.7    Recent Labs Lab 02/17/14 1935  AMMONIA 54   CBC:  Recent Labs Lab 02/17/14 1047 02/18/14 0444  WBC 8.7 8.0  NEUTROABS 5.7  --   HGB 13.8 11.7*  HCT 45.3 37.6  MCV 102.7* 100.5*  PLT 139* 131*   BNP (last 3 results)  Recent Labs  05/03/13 0500  PROBNP 440.8*   CBG:  Recent Labs Lab 02/17/14 2216 02/18/14 0813 02/18/14 1236 02/18/14 1723 02/18/14 2203  GLUCAP 111* 94 174* 79 165*    Recent Results (from the past 240 hour(s))  MRSA PCR SCREENING     Status: None   Collection Time    02/17/14  7:04 PM      Result Value Ref Range Status   MRSA by PCR NEGATIVE  NEGATIVE Final   Comment:            The GeneXpert MRSA Assay (FDA     approved for NASAL specimens     only), is one component of a     comprehensive MRSA colonization     surveillance program. It is not     intended to diagnose MRSA     infection nor to guide or     monitor treatment for     MRSA infections.     Studies: No results found.  Scheduled Meds: . atorvastatin  20 mg Oral q1800  . cefTRIAXone (ROCEPHIN)  IV  1 g Intravenous Q24H  . heparin  5,000 Units Subcutaneous 3 times per day  . hydrALAZINE  50 mg Oral TID  . insulin aspart  0-15 Units Subcutaneous TID WC  . insulin aspart  0-5 Units Subcutaneous QHS  . losartan  25 mg Oral Daily  . magnesium sulfate 1 - 4 g bolus IVPB  2 g Intravenous Once  . memantine  10 mg Oral BID  . metoprolol tartrate  12.5 mg Oral BID  . pantoprazole  40 mg Oral Daily  . sertraline  100 mg Oral Daily  . sodium chloride  3 mL Intravenous Q12H   Continuous Infusions: . dextrose 270 mL (02/19/14 0859)    Principal Problem:   Hypernatremia Active Problems:   Dementia with behavioral disturbance   FTT (failure to thrive) in adult   Acute encephalopathy   UTI (urinary tract infection)   Sinus tachycardia    Time spent: [address]  Sabrina Long GaudyBenda, Student-PA Sabrina Long,  [place] [phone] [facility]  If 7PM-7AM, please contact night-coverage at [email or web address], password [email or web address] 02/19/2014, 11:35 AM  LOS: 2 days     Addendum  Patient seen and examined, chart and data base reviewed.  I agree with the above assessment and plan.  For full details please see Mrs. Algis DownsMarianne York PA note.  UTI and hypernatremia, patient is on Rocephin and D5W.   Clint LippsMutaz A Datron Brakebill, MD [facility] Pager: [phone] 02/19/2014, 4:49 PM

## 2014-02-19 NOTE — Progress Notes (Addendum)
Clinical Social Work Department CLINICAL SOCIAL WORK PLACEMENT NOTE 02/19/2014  Patient:  Consuello ClossMORRISON,Liliann P  Account Number:  [id number] Admit date:  02/17/2014  Clinical Social Worker:  Harless NakayamaPOONUM Daren Doswell, LCSWA  Date/time:  02/19/2014 03:30 PM  Clinical Social Work is seeking post-discharge placement for this patient at the following level of care:   SKILLED NURSING   (*CSW will update this form in Epic as items are completed)   02/19/2014  Patient/family provided with [facility] Department of Clinical Social Work's list of facilities offering this level of care within the geographic area requested by the patient (or if unable, by the patient's family).  02/19/2014  Patient/family informed of their freedom to choose among providers that offer the needed level of care, that participate in Medicare, Medicaid or managed care program needed by the patient, have an available bed and are willing to accept the patient.  02/19/2014  Patient/family informed of [facility]' ownership interest in [facility], as well as of the fact that they are under no obligation to receive care at this facility.  PASARR submitted to EDS on 02/19/2014 PASARR number received from EDS on 02/19/2014  FL2 transmitted to all facilities in geographic area requested by pt/family on  02/19/2014 FL2 transmitted to all facilities within larger geographic area on   Patient informed that his/her managed care company has contracts with or will negotiate with  certain facilities, including the following:     Patient/family informed of bed offers received:  02/20/2014 [facility] Physician recommends and patient chooses bed at    [facility] on  02/20/2014 [facility]  The following physician request were entered in Epic:   Additional Comments:   Keslyn Teater, LCSWA [phone]

## 2014-02-19 NOTE — Evaluation (Signed)
Physical Therapy Evaluation Patient Details Name: Sabrina Long MRN: [id number] DOB: May 22, 1936 Today's Date: 02/19/2014   History of Present Illness  78 yo with end stage dementia admitted from home with son with increased AMS, FTT, UTI and hypernatremia  Clinical Impression  Pt pleasantly confused and sporadic statements throughout "yes maam", "that's right" . History and PLOF provided by dgtr. Pt normally able to walk around house with min assist of son but not able to perform this level of mobility today. Pt with baseline dementia and will benefit from acute therapy to maximize mobility, function and decrease burden of care with initial recommendation for St-SNF to return pt to PLOF to go home with son. Recommend OOb daily with staff.     Follow Up Recommendations SNF;Supervision/Assistance - 24 hour    Equipment Recommendations  None recommended by PT    Recommendations for Other Services       Precautions / Restrictions Precautions Precautions: Fall      Mobility  Bed Mobility Overal bed mobility: Needs Assistance Bed Mobility: Supine to Sit     Supine to sit: Min assist     General bed mobility comments: multimodal cues with increased time and hand over hand assist to grasp rail to assist with supine to sit  Transfers Overall transfer level: Needs assistance   Transfers: Sit to/from Stand;Stand Pivot Transfers Sit to Stand: Mod assist Stand pivot transfers: Mod assist;+2 physical assistance       General transfer comment: pt with assist for transfer and with pivot to toilet increased assist at pt leaning and pushing in the opposite direction of BSC with dgtr assist to pivot pelvis to [facility]. Min assist to stand from [facility] with mod assist of only 1 to pivot from [facility] to chair after standing grossly 2 min for total assist pericare  Ambulation/Gait                Stairs            Wheelchair Mobility    Modified Rankin (Stroke Patients Only)        Balance Overall balance assessment: Needs assistance   Sitting balance-Leahy Scale: Fair       Standing balance-Leahy Scale: Poor                               Pertinent Vitals/Pain PAINAD= 0    Home Living Family/patient expects to be discharged to:: Private residence Living Arrangements: Children Available Help at Discharge: Family;Available 24 hours/day Type of Home: House         Home Equipment: Emergency planning/management officerhower seat;Walker - 2 wheels      Prior Function Level of Independence: Needs assistance   Gait / Transfers Assistance Needed: Min A with assist with gait only needed over the last few weeks prior was supervision  ADL's / Homemaking Assistance Needed: Needs A for getting in shower and S while showering (usually showers standing up--they do have a seat but she does not use), assist for dressing and tolieting; does not do any of the IADLs        Hand Dominance        Extremity/Trunk Assessment   Upper Extremity Assessment: Generalized weakness           Lower Extremity Assessment: Generalized weakness      Cervical / Trunk Assessment: Kyphotic  Communication   Communication: Receptive difficulties;Expressive difficulties  Cognition Arousal/Alertness: Awake/alert Behavior During Therapy: Flat affect  Overall Cognitive Status: History of cognitive impairments - at baseline                      General Comments      Exercises        Assessment/Plan    PT Assessment Patient needs continued PT services  PT Diagnosis Abnormality of gait;Altered mental status;Generalized weakness   PT Problem List Decreased strength;Decreased cognition;Decreased activity tolerance;Decreased balance;Decreased mobility;Decreased safety awareness;Decreased coordination  PT Treatment Interventions Gait training;Functional mobility training;Therapeutic activities;Patient/family education   PT Goals (Current goals can be found in the Care Plan  section) Acute Rehab PT Goals Patient Stated Goal: to get her strength back up to return home with son PT Goal Formulation: With family Time For Goal Achievement: 03/05/14 Potential to Achieve Goals: Fair    Frequency Min 2X/week   Barriers to discharge Decreased caregiver support      Co-evaluation               End of Session Equipment Utilized During Treatment: Gait belt Activity Tolerance: Patient tolerated treatment well Patient left: in chair;with chair alarm set;with call bell/phone within reach;with family/visitor present;with nursing/sitter in room Nurse Communication: Mobility status;Precautions         Time: [id number] PT Time Calculation (min): 25 min   Charges:   PT Evaluation $Initial PT Evaluation Tier I: 1 Procedure PT Treatments $Therapeutic Activity: 23-37 mins   PT G Codes:          Dotty Gonzalo B Marckus Hanover 02/19/2014, 11:02 AM Delaney MeigsMaija Tabor Yoshika Vensel, PT [phone]

## 2014-02-19 NOTE — Progress Notes (Signed)
VSS - Blood pressure 127/73, pulse 61, temperature 98.2 F (36.8 C), temperature source Oral, resp. rate 18, height 5\' 4"  (1.626 m), weight 61.462 kg (135 lb 8 oz), SpO2 94.00%., Pt. Scheduled to received hydralazine & lopressor.  Dr. Arthor CaptainElmahi texted paged to see if he wanted to hold meds. Now.  Return called from Dr. Arthor CaptainElmahi and orders to hold now and parameters placed. Will continue to monitor.  Forbes Cellarelcine Tyshia Fenter, RN

## 2014-02-20 LAB — URINE CULTURE

## 2014-02-20 LAB — CBC
HCT: 37.8 % (ref 36.0–46.0)
Hemoglobin: 12 g/dL (ref 12.0–15.0)
MCH: 30.8 pg (ref 26.0–34.0)
MCHC: 31.7 g/dL (ref 30.0–36.0)
MCV: 97.2 fL (ref 78.0–100.0)
PLATELETS: 156 10*3/uL (ref 150–400)
RBC: 3.89 MIL/uL (ref 3.87–5.11)
RDW: 13.3 % (ref 11.5–15.5)
WBC: 6.8 10*3/uL (ref 4.0–10.5)

## 2014-02-20 LAB — BASIC METABOLIC PANEL
BUN: 7 mg/dL (ref 6–23)
CO2: 23 mEq/L (ref 19–32)
Calcium: 9.2 mg/dL (ref 8.4–10.5)
Chloride: 109 mEq/L (ref 96–112)
Creatinine, Ser: 0.81 mg/dL (ref 0.50–1.10)
GFR calc Af Amer: 80 mL/min — ABNORMAL LOW (ref >90)
GFR calc non Af Amer: 69 mL/min — ABNORMAL LOW (ref >90)
Glucose, Bld: 145 mg/dL — ABNORMAL HIGH (ref 70–99)
Potassium: 3.3 mEq/L — ABNORMAL LOW (ref 3.7–5.3)
Sodium: 145 mEq/L (ref 137–147)

## 2014-02-20 LAB — GLUCOSE, CAPILLARY
Glucose-Capillary: 140 mg/dL — ABNORMAL HIGH (ref 70–99)
Glucose-Capillary: 131 mg/dL — ABNORMAL HIGH (ref 70–99)

## 2014-02-20 MED ORDER — POTASSIUM CHLORIDE CRYS ER 20 MEQ PO TBCR: 40.0000 meq | EXTENDED_RELEASE_TABLET | Freq: Once | ORAL | Status: DC

## 2014-02-20 MED ORDER — ACETAMINOPHEN 325 MG PO TABS: 650.0000 mg | ORAL_TABLET | Freq: Four times a day (QID) | ORAL | Status: DC | PRN

## 2014-02-20 MED ORDER — POTASSIUM CHLORIDE 20 MEQ/15ML (10%) PO LIQD
60.0000 meq | Freq: Once | ORAL | Status: AC
  Administered 2014-02-20: 60 meq via ORAL
  Filled 2014-02-20: qty 45

## 2014-02-20 MED ORDER — INSULIN ASPART 100 UNIT/ML ~~LOC~~ SOLN: 0.0000 [IU] | Freq: Every day | SUBCUTANEOUS | Status: DC

## 2014-02-20 MED ORDER — INSULIN ASPART 100 UNIT/ML ~~LOC~~ SOLN: 0.0000 [IU] | Freq: Three times a day (TID) | SUBCUTANEOUS | Status: DC

## 2014-02-20 NOTE — Discharge Summary (Signed)
Addendum  Patient seen and examined, chart and data base reviewed.  I agree with the above assessment and plan.  For full details please see Mrs. Algis DownsMarianne York PA note.  Hypernatremia and dehydration, resolved with infusion of D5W.  UTI on urinalysis but the culture showed no significant growth, already had 3 days of Rocephin.  Baseline of receptive/expressive (Global) aphasia, per family back to baseline per   Clint LippsMutaz A Kaylani Fromme, MD [facility] Pager: [phone] 02/20/2014, 3:12 PM

## 2014-02-20 NOTE — Progress Notes (Signed)
CSW Proofreader(Clinical Social Worker) spoke with pt daughter and [facility] CSW called facility and left voicemail to notify of acceptance of bed and dc today.  Sabrina Long, LCSWA [phone]

## 2014-02-20 NOTE — Discharge Summary (Signed)
Physician Discharge Summary  [name]:[id number] DOB: 21-Jun-1936 DOA: 02/17/2014  PCP: Bufford Spikes, DO  Admit date: 02/17/2014 Discharge date: 02/20/2014  Time spent: 45 minutes  Recommendations for Outpatient Follow-up:  1. BMET on 5/4  Discharge Diagnoses:  Principal Problem:   Hypernatremia Active Problems:   Dementia with behavioral disturbance   FTT (failure to thrive) in adult   Acute encephalopathy   UTI (urinary tract infection)   Sinus tachycardia   Discharge Condition: stable.  Diet recommendation: carb mod  History of present illness at the time of admission:  Sabrina Long is a 78 y.o. female with a past medical history of dementia with behavioral disturbance, presenting to the emergency room with complaints of mental status changes, failure to thrive, functional decline, lethargy and minimal by mouth intake. Patient had been on Risperdal therapy for behavioral changes associated with her dementia. Since starting Risperdal patient has been lethargic and minimally active, for which her primary care physician changed her to Seroquel 12.5 mg at bedtime as needed for psychosis. Lab work in the emergency room showing an elevated sodium of 163. Patient presents lethargic, difficult to arouse, unable to provide history. Her son states that she has been in this state for the last several days.  History was obtained from her son present at bedside.    Hospital Course:   Hypernatremia.  Presumed due to oversedation from seroquel.    Initially treated with normal saline in the ED, this was changed to D5W.  BMET was initially monitored every 4 hours.  Gradually the patient's sodium was lowered  (145 on 5/1).  Her mental status returned to baseline soon after admission.  We have continued to hold seroquel.  The patient is calm and alert.  Will discontinue seroquel on discharge.  UTI  UA on 4/28 indicated possible infection  Cultured showed insignificant  growth.   Was given Rocephin IV.  This was discontinued once the culture results returned.   Dementia  History of dementia.  Stable.  On namenda.   Acute Encephalopathy  Most likely caused by the hypernatremia and UTI in the setting of baseline dementia. Resolved.  Per the patient's daughter, she has returned to baseline (pleasant, alert, listens but speaks gibberish)  Tachycardia  Patient experienced tachycardia as she became stronger. This resolved as soon her her home dose betablocker was resumed.   Anxiety/agitation  Patient is on Xanax at home  Reordered Xanax prn at needed.  Patient appears calm and alert.  No RN reports of agitation.  Hypokalemia  Repleted.  Supplemented Magnesium as well.  Thrombocytopenia  Acute drop compared to 3 weeks ago (131 on 4/29) Likely an acute phase reactant. Is slowing improving 156 on 5/1  Discharge Exam: Filed Vitals:   02/20/14 0900  BP: 112/70  Pulse: 96  Temp:   Resp:    Constitutional:  She appears well-developed and well-nourished. No distress.  Head: Normocephalic and atraumatic.  Neck: No JVD present. Supple. Cardiovascular: Normal rate, regular rhythm and normal heart sounds.  Respiratory: Effort normal and breath sounds normal. No stridor. No respiratory distress. She has no wheezes.  GI: Soft. Bowel sounds are normal. She exhibits no distension. There is no tenderness. There is no rebound.  Musculoskeletal: Normal range of motion. She exhibits no tenderness.  Neurological: She has both receptive and expressive aphasia on exam. Skin: Skin is warm and dry. She is not diaphoretic. No erythema.     Discharge Instructions  Discharge Orders   Future Appointments Provider Department Dept Phone   03/05/2014 2:15 PM Claudie ReveringJessica M Karam, NP [facility] [phone]   Future Orders Complete By Expires   Diet - low sodium heart healthy  As directed    Increase activity slowly  As directed    Increase activity  slowly  As directed        Medication List    STOP taking these medications       QUEtiapine 12.5 mg Tabs tablet  Commonly known as:  SEROQUEL      TAKE these medications       acetaminophen 325 MG tablet  Commonly known as:  TYLENOL  Take 2 tablets (650 mg total) by mouth every 6 (six) hours as needed for mild pain (or Fever >/= 101).     alendronate 70 MG tablet  Commonly known as:  FOSAMAX  Take 1 tablet (70 mg total) by mouth every 7 (seven) days. Take with a full glass of water on an empty stomach.     ALPRAZolam 0.5 MG tablet  Commonly known as:  XANAX  Take 0.5 mg by mouth See admin instructions. Take 0.5 mg three times daily and 0.5 mg at bedtime as needed for sleep.     CENTRUM SILVER PO  Take 1 tablet by mouth daily.     CRESTOR 10 MG tablet  Generic drug:  rosuvastatin  Take 10 mg by mouth daily.     diclofenac sodium 1 % Gel  Commonly known as:  VOLTAREN  Apply 4 g topically 4 (four) times daily as needed. For right knee pain     hydrALAZINE 50 MG tablet  Commonly known as:  APRESOLINE  Take 1 tablet (50 mg total) by mouth 3 (three) times daily.     insulin aspart 100 UNIT/ML injection  Commonly known as:  novoLOG  Inject 0-15 Units into the skin 3 (three) times daily with meals.     insulin aspart 100 UNIT/ML injection  Commonly known as:  novoLOG  Inject 0-5 Units into the skin at bedtime.     losartan 25 MG tablet  Commonly known as:  COZAAR  Take 25 mg by mouth daily.     memantine 10 MG tablet  Commonly known as:  NAMENDA  Take 10 mg by mouth 2 (two) times daily.     metoprolol tartrate 25 MG tablet  Commonly known as:  LOPRESSOR  Take 1/2 tablet by mouth twice a day     nitroGLYCERIN 0.4 MG SL tablet  Commonly known as:  NITROSTAT  Place 0.4 mg under the tongue every 5 (five) minutes as needed for chest pain. Take one tablet sublingual every 5 minutes not to exceed three tablets for chest pain     omeprazole 20 MG capsule  Commonly  known as:  PRILOSEC  Take 20 mg by mouth daily.     sertraline 100 MG tablet  Commonly known as:  ZOLOFT  Take 100 mg by mouth daily.       Allergies  Allergen Reactions  . Risperdal [Risperidone] Other (See Comments)    sedation      The results of significant diagnostics from this hospitalization (including imaging, microbiology, ancillary and laboratory) are listed below for reference.    Significant Diagnostic Studies: No results found.  Microbiology: Recent Results (from the past 240 hour(s))  URINE CULTURE     Status: None   Collection Time    02/17/14  1:13 PM  Result Value Ref Range Status   Specimen Description URINE, RANDOM   Final   Special Requests ADDED [phone]   Final   Culture  Setup Time     Final   Value: 02/19/2014 07:15     Performed at [facility]   Colony Count     Final   Value: 9,000 COLONIES/ML     Performed at [facility]   Culture     Final   Value: INSIGNIFICANT GROWTH     Performed at [facility]   Report Status 02/20/2014 FINAL   Final  MRSA PCR SCREENING     Status: None   Collection Time    02/17/14  7:04 PM      Result Value Ref Range Status   MRSA by PCR NEGATIVE  NEGATIVE Final   Comment:            The GeneXpert MRSA Assay (FDA     approved for NASAL specimens     only), is one component of a     comprehensive MRSA colonization     surveillance program. It is not     intended to diagnose MRSA     infection nor to guide or     monitor treatment for     MRSA infections.     Labs: Basic Metabolic Panel:  Recent Labs Lab 02/18/14 2125 02/19/14 0150 02/19/14 0656 02/19/14 1415 02/20/14 0745  NA 156* 155* 152* 153* 145  K 3.4* 4.4 3.4* 3.5* 3.3*  CL 120* 118* 115* 115* 109  CO2 24 21 24 22 23   GLUCOSE 172* 95 133* 138* 145*  BUN 13 13 10 10 7   CREATININE 0.91 0.94 0.90 0.89 0.81  CALCIUM 9.1 9.4 9.7 9.4 9.2    Recent Labs Lab 02/17/14 1935  AMMONIA 54   CBC:  Recent  Labs Lab 02/17/14 1047 02/18/14 0444 02/20/14 0745  WBC 8.7 8.0 6.8  NEUTROABS 5.7  --   --   HGB 13.8 11.7* 12.0  HCT 45.3 37.6 37.8  MCV 102.7* 100.5* 97.2  PLT 139* 131* 156   BNP: BNP (last 3 results)  Recent Labs  05/03/13 0500  PROBNP 440.8*   CBG:  Recent Labs Lab 02/19/14 0759 02/19/14 1213 02/19/14 1651 02/19/14 2143 02/20/14 0754  GLUCAP 126* 165* 170* 112* 140*       Signed:  Conley CanalMarianne L York, PA-C [phone]  [facility] 02/20/2014, 12:05 PM

## 2014-02-20 NOTE — Progress Notes (Addendum)
CSW (Clinical Child psychotherapistocial Worker) arranging discharge to [facility] and was informed by facility they are no longer able to offer a bed for reasons not explained to CSW. CSW did ask for facility to call family. CSW also called pt daughter Cira Ruerlene and informed that [facility] unable to [facility]. CSW provided pt daughter with alternate bed offers. Pt daughter is aware that pt is ready for dc today and facility choice will need to be made as soon as possible. CSW awaiting return phone call with decision from pt family.  Family has [facility].  Khala Tarte, LCSWA [phone]

## 2014-02-20 NOTE — Care Management Note (Signed)
    Page 1 of 1   02/20/2014     5:22:10 PM CARE MANAGEMENT NOTE 02/20/2014  Patient:  Sabrina Long,Sabrina Long   Account Number:  [id number]  Date Initiated:  02/20/2014  Documentation initiated by:  Letha CapeAYLOR,Lovis More  Subjective/Objective Assessment:   dx hypernatremia  admit- lives with son.     Action/Plan:   pt eval- rec snf.   Anticipated DC Date:  02/20/2014   Anticipated DC Plan:  SKILLED NURSING FACILITY  In-house referral  Clinical Social Worker      DC Planning Services  CM consult      Choice offered to / List presented to:             Status of service:  Completed, signed off Medicare Important Message given?  NA - LOS <3 / Initial given by admissions (If response is "NO", the following Medicare IM given date fields will be blank) Date Medicare IM given:   Date Additional Medicare IM given:    Discharge Disposition:  SKILLED NURSING FACILITY  Per UR Regulation:  Reviewed for med. necessity/level of care/duration of stay  If discussed at Long Length of Stay Meetings, dates discussed:    Comments:

## 2014-03-05 ENCOUNTER — Ambulatory Visit: Payer: Medicare Other | Admitting: Nurse Practitioner

## 2014-03-10 DIAGNOSIS — F0281 Dementia in other diseases classified elsewhere with behavioral disturbance: Secondary | ICD-10-CM

## 2014-03-10 DIAGNOSIS — E119 Type 2 diabetes mellitus without complications: Secondary | ICD-10-CM

## 2014-03-10 DIAGNOSIS — F02818 Dementia in other diseases classified elsewhere, unspecified severity, with other behavioral disturbance: Secondary | ICD-10-CM

## 2014-03-10 DIAGNOSIS — G309 Alzheimer's disease, unspecified: Secondary | ICD-10-CM

## 2014-03-10 DIAGNOSIS — F0280 Dementia in other diseases classified elsewhere without behavioral disturbance: Secondary | ICD-10-CM

## 2014-03-10 DIAGNOSIS — M6281 Muscle weakness (generalized): Secondary | ICD-10-CM

## 2014-03-16 ENCOUNTER — Other Ambulatory Visit: Payer: Self-pay | Admitting: Internal Medicine

## 2014-03-25 ENCOUNTER — Ambulatory Visit: Payer: Medicare Other | Admitting: Nurse Practitioner

## 2014-04-02 ENCOUNTER — Encounter: Payer: Self-pay | Admitting: Internal Medicine

## 2014-04-02 ENCOUNTER — Ambulatory Visit (INDEPENDENT_AMBULATORY_CARE_PROVIDER_SITE_OTHER): Payer: Medicare Other | Admitting: Internal Medicine

## 2014-04-02 VITALS — BP 128/72 | HR 51 | Temp 97.9°F | Resp 10 | Wt 134.0 lb

## 2014-04-02 DIAGNOSIS — F0280 Dementia in other diseases classified elsewhere without behavioral disturbance: Secondary | ICD-10-CM | POA: Insufficient documentation

## 2014-04-02 DIAGNOSIS — E870 Hyperosmolality and hypernatremia: Secondary | ICD-10-CM

## 2014-04-02 DIAGNOSIS — B351 Tinea unguium: Secondary | ICD-10-CM

## 2014-04-02 DIAGNOSIS — G309 Alzheimer's disease, unspecified: Secondary | ICD-10-CM

## 2014-04-02 DIAGNOSIS — B369 Superficial mycosis, unspecified: Secondary | ICD-10-CM

## 2014-04-02 DIAGNOSIS — R7309 Other abnormal glucose: Secondary | ICD-10-CM

## 2014-04-02 DIAGNOSIS — I701 Atherosclerosis of renal artery: Secondary | ICD-10-CM

## 2014-04-02 DIAGNOSIS — R739 Hyperglycemia, unspecified: Secondary | ICD-10-CM

## 2014-04-02 MED ORDER — CLOTRIMAZOLE-BETAMETHASONE 1-0.05 % EX CREA: 1.0000 "application " | TOPICAL_CREAM | Freq: Two times a day (BID) | CUTANEOUS | Status: DC

## 2014-04-02 NOTE — Progress Notes (Signed)
Patient ID: Sabrina Long, female   DOB: [birth year], 78 y.o.   MRN: [id number]   [facility]  Code Status: full code  Allergies  Allergen Reactions  . Risperdal [Risperidone] Other (See Comments)    sedation    Chief Complaint  Patient presents with  . Nail Problem    Toe nail fungus (both feet )- patient would like referral     HPI: Patient is a 78 y.o. black female seen in the office today for acute visit with a few concerns:  Has been scratching the middle of her back Son has been putting neosporin on it, but he's concerned what it looks like  Nurse from [facility] came and said she has toenail fungus.  Couldn't get it done at spa, so needs podiatry referral.  Has been checking her sugar, but has not had to give her but 1 unit in 2 mos.  hba1c was 6.1 at hospital a month ago.  She is 78 yo with dementia.    Review of Systems:  Review of Systems  Constitutional: Negative for fever.  Genitourinary: Negative for dysuria.  Musculoskeletal: Negative for falls.  Skin: Positive for itching and rash.       Toenail fungus;  Rash midback that she has been itching  Neurological: Negative for dizziness and loss of consciousness.  Endo/Heme/Allergies:       Hyperglycemia, not in diabetic range but was put on insulin and left on it at discharge  Psychiatric/Behavioral: Positive for memory loss.     Past Medical History  Diagnosis Date  . Unspecified constipation   . Anxiety state, unspecified   . Colitis, enteritis, and gastroenteritis of presumed infectious origin 11/02/2011  . Pneumonia, organism unspecified 11/02/2011  . Cystitis, unspecified   . Impetigo   . Renal artery stenosis   . Dementia in conditions classified elsewhere with behavioral disturbance   . Hypercalcemia   . Reflux esophagitis   . Chest pain, unspecified   . Abnormality of gait   . Hyperosmolality and/or hypernatremia   . Unspecified disorder of  liver   . Candidiasis of vulva and vagina   . Alzheimer's disease   . Chest pain, unspecified 08/19/2008  . Dementia in conditions classified elsewhere without behavioral disturbance   . Depressive disorder, not elsewhere classified   . Esophageal reflux   . Other and unspecified hyperlipidemia   . Loss of weight   . Unspecified persistent mental disorders due to conditions classified elsewhere   . Reactive confusion   . Unspecified essential hypertension   . Other seborrheic keratosis   . Osteoarthrosis, unspecified whether generalized or localized, unspecified site   . Stiffness of joint, not elsewhere classified, unspecified site   . Memory loss 07/24/2007  . Headache(784.0)   . Undiagnosed cardiac murmurs     Past Surgical History  Procedure Laterality Date  . Cesarean section      Social History:   reports that she has never smoked. She has never used smokeless tobacco. She reports that she does not drink alcohol or use illicit drugs.  History reviewed. No pertinent family history.  Medications: Patient's Medications  New Prescriptions   No medications on file  Previous Medications   ACETAMINOPHEN (TYLENOL) 325 MG TABLET    Take 2 tablets (650 mg total) by mouth every 6 (six) hours as needed for mild pain (or Fever >/= 101).   ALENDRONATE (FOSAMAX) 70 MG TABLET    Take 1  tablet (70 mg total) by mouth every 7 (seven) days. Take with a full glass of water on an empty stomach.   ALPRAZOLAM (XANAX) 0.5 MG TABLET    Take 0.5 mg by mouth See admin instructions. Take 0.5 mg three times daily and 0.5 mg at bedtime as needed for sleep.   DICLOFENAC SODIUM (VOLTAREN) 1 % GEL    Apply 4 g topically 4 (four) times daily as needed. For right knee pain   HYDRALAZINE (APRESOLINE) 50 MG TABLET    Take 1 tablet (50 mg total) by mouth 3 (three) times daily.   INSULIN ASPART (NOVOLOG) 100 UNIT/ML INJECTION    Inject 0-15 Units into the skin 3 (three) times daily with meals.   INSULIN  ASPART (NOVOLOG) 100 UNIT/ML INJECTION    Inject 0-5 Units into the skin at bedtime.   LOSARTAN (COZAAR) 25 MG TABLET    Take 25 mg by mouth daily.   MEMANTINE (NAMENDA) 10 MG TABLET    Take 10 mg by mouth 2 (two) times daily.   METOPROLOL TARTRATE (LOPRESSOR) 25 MG TABLET    Take 1/2 tablet by mouth twice a day   MULTIPLE VITAMINS-MINERALS (CENTRUM SILVER PO)    Take 1 tablet by mouth daily.    NITROGLYCERIN (NITROSTAT) 0.4 MG SL TABLET    Place 0.4 mg under the tongue every 5 (five) minutes as needed for chest pain. Take one tablet sublingual every 5 minutes not to exceed three tablets for chest pain   OMEPRAZOLE (PRILOSEC) 20 MG CAPSULE    Take 20 mg by mouth daily.   OMEPRAZOLE (PRILOSEC) 20 MG CAPSULE    TAKE 1 CAPSULE DAILY TO REDUCE STOMACH ACID AND TO HELP PROTECT ESOPHAGUS   ROSUVASTATIN (CRESTOR) 10 MG TABLET    Take 10 mg by mouth daily.    SERTRALINE (ZOLOFT) 100 MG TABLET    Take 100 mg by mouth daily.   Modified Medications   No medications on file  Discontinued Medications   No medications on file     Physical Exam: Filed Vitals:   04/02/14 1524  BP: 128/72  Pulse: 51  Temp: 97.9 F (36.6 C)  TempSrc: Axillary  Resp: 10  Weight: 134 lb (60.782 kg)  SpO2: 97%  Physical Exam  Constitutional:  Frail black female, seems back to her normal mental state  Cardiovascular: Normal rate, regular rhythm, normal heart sounds and intact distal pulses.   Pulmonary/Chest: Effort normal and breath sounds normal. No respiratory distress.  Abdominal: Soft. Bowel sounds are normal. She exhibits no distension and no mass. There is no tenderness.  Neurological: She is alert.  Skin:  Irregular raised patch on lower thoracic/upper lumbar spine region with evidence of excoriation    Labs reviewed: Basic Metabolic Panel:  Recent Labs  40/98/11 0345 01/22/14 1301  02/17/14 1935  02/19/14 0656 02/19/14 1415 02/20/14 0745  NA 140 146*  < >  --   < > 152* 153* 145  K 3.3* 4.1  < >   --   < > 3.4* 3.5* 3.3*  CL 110 107  < >  --   < > 115* 115* 109  CO2 24 22  < >  --   < > 24 22 23   GLUCOSE 112* 125*  < >  --   < > 133* 138* 145*  BUN 9 14  < >  --   < > 10 10 7   CREATININE 0.63 0.85  < >  --   < >  0.90 0.89 0.81  CALCIUM 9.2 9.9  < >  --   < > 9.7 9.4 9.2  TSH  --  2.180  --  2.330  --   --   --   --   < > = values in this interval not displayed. Liver Function Tests:  Recent Labs  05/01/13 1827 05/03/13 1200 05/04/13 0345 01/22/14 1301  AST 53* 44* 70* 15  ALT 52* 42* 56* 12  ALKPHOS 80 71 86 71  BILITOT 0.5 0.3 0.3 <0.2  PROT 8.1 7.1 6.7 7.6  ALBUMIN 3.1* 2.5* 2.4*  --     Recent Labs  05/01/13 1827  LIPASE 19    Recent Labs  02/17/14 1935  AMMONIA 54   CBC:  Recent Labs  05/04/13 0345 01/22/14 1301 02/17/14 1047 02/18/14 0444 02/20/14 0745  WBC 9.1 6.8 8.7 8.0 6.8  NEUTROABS 6.6 3.4 5.7  --   --   HGB 10.2* 12.6 13.8 11.7* 12.0  HCT 31.2* 38.7 45.3 37.6 37.8  MCV 95.1 97 102.7* 100.5* 97.2  PLT 221 295 139* 131* 156   Lab Results  Component Value Date   HGBA1C 6.1* 02/17/2014   Assessment/Plan 1. Onychomycosis -discussed that I usually do not treat with oral agents for this due to liver toxicity they can cause -recommended otc polishes if they are truly concerned about this, but even with faithful use, takes years to get better - should get nails trimmed by podiatry:  Ambulatory referral to Podiatry  2. Hypernatremia -f/u labs but level of alertness clearly improved after hydration and stopping risperdal  3. Alzheimer's disease -late stage, dependent in ADLs, but does remain ambulatory  4. Hyperglycemia -mild, advised her son that he can scale back to weekly cbgs as he has only had to give 1 unit of novolog since she was discharged from the hospital b/c sugars have been wnl -at 77 with her degree of dementia, tight glucose control is more dangerous than hyperglycemia  5. Superficial fungal infection of skin -on  midback -prescribed clotrimazole cream for this bid until resolution -I should be notified if it does not improve in 2 wks  Labs/tests ordered: Orders Placed This Encounter  Procedures  . Ambulatory referral to Podiatry    Referral Priority:  Routine    Referral Type:  Consultation    Referral Reason:  Specialty Services Required    Requested Specialty:  Podiatry    Number of Visits Requested:  1    Next appt:  3 mos for routine visit

## 2014-04-17 ENCOUNTER — Ambulatory Visit: Payer: Medicare Other | Admitting: Internal Medicine

## 2014-04-17 DIAGNOSIS — Z0289 Encounter for other administrative examinations: Secondary | ICD-10-CM

## 2014-04-22 ENCOUNTER — Other Ambulatory Visit: Payer: Self-pay | Admitting: Nurse Practitioner

## 2014-04-27 ENCOUNTER — Other Ambulatory Visit: Payer: Self-pay | Admitting: Internal Medicine

## 2014-04-29 ENCOUNTER — Encounter: Payer: Self-pay | Admitting: Podiatrist

## 2014-04-29 ENCOUNTER — Ambulatory Visit (INDEPENDENT_AMBULATORY_CARE_PROVIDER_SITE_OTHER): Payer: Medicare Other | Admitting: Podiatrist

## 2014-04-29 DIAGNOSIS — B351 Tinea unguium: Secondary | ICD-10-CM

## 2014-04-29 DIAGNOSIS — I701 Atherosclerosis of renal artery: Secondary | ICD-10-CM

## 2014-04-29 DIAGNOSIS — M79609 Pain in unspecified limb: Secondary | ICD-10-CM

## 2014-04-29 DIAGNOSIS — M79673 Pain in unspecified foot: Secondary | ICD-10-CM

## 2014-04-29 NOTE — Progress Notes (Signed)
   Subjective:    Patient ID: Sabrina Long, female    DOB: Apr 11, 1936, 78 y.o.   MRN: [id number]  HPI I NEED TO HAVE MY TOENAILS TRIMMED UP THEY ARE THICK AND DISCOLORED    Review of Systems     Objective:   Physical Exam Patient is comfortable in the chair In no acute distress- affect consistent with dementia.  Vascular status is intact with palpable pedal pulses at 1/4 DP and PT bilateral and capillary refill time within normal limits. Neurological sensation is also intact bilaterally via Semmes Weinstein monofilament at 5/5 sites.  Dermatological exam reveals skin color, turger and texture as normal. No open lesions present nails are elongated, thickened, discolored, dystrophic, clinically mycotic 1 through 5 bilateral..  Musculature intact with dorsiflexion, plantarflexion, inversion, eversion.    Assessment & Plan:  Symptomatic mycotic toenails  Plan: Debridement of nails was carried out today without complication she'll be seen back in 3 months or as needed for followup.

## 2014-04-30 ENCOUNTER — Emergency Department (HOSPITAL_COMMUNITY)
Admission: EM | Admit: 2014-04-30 | Discharge: 2014-04-30 | Disposition: A | Discharge status: Home / Self Care | Payer: Medicare Other | Attending: Emergency Medicine | Admitting: Emergency Medicine

## 2014-04-30 ENCOUNTER — Encounter (HOSPITAL_COMMUNITY): Payer: Self-pay | Admitting: Emergency Medicine

## 2014-04-30 DIAGNOSIS — Z872 Personal history of diseases of the skin and subcutaneous tissue: Secondary | ICD-10-CM | POA: Insufficient documentation

## 2014-04-30 DIAGNOSIS — Z862 Personal history of diseases of the blood and blood-forming organs and certain disorders involving the immune mechanism: Secondary | ICD-10-CM | POA: Insufficient documentation

## 2014-04-30 DIAGNOSIS — E785 Hyperlipidemia, unspecified: Secondary | ICD-10-CM | POA: Insufficient documentation

## 2014-04-30 DIAGNOSIS — Z8701 Personal history of pneumonia (recurrent): Secondary | ICD-10-CM | POA: Insufficient documentation

## 2014-04-30 DIAGNOSIS — Z8639 Personal history of other endocrine, nutritional and metabolic disease: Secondary | ICD-10-CM | POA: Insufficient documentation

## 2014-04-30 DIAGNOSIS — I701 Atherosclerosis of renal artery: Secondary | ICD-10-CM | POA: Insufficient documentation

## 2014-04-30 DIAGNOSIS — F4489 Other dissociative and conversion disorders: Secondary | ICD-10-CM | POA: Insufficient documentation

## 2014-04-30 DIAGNOSIS — F329 Major depressive disorder, single episode, unspecified: Secondary | ICD-10-CM | POA: Insufficient documentation

## 2014-04-30 DIAGNOSIS — Z8739 Personal history of other diseases of the musculoskeletal system and connective tissue: Secondary | ICD-10-CM | POA: Insufficient documentation

## 2014-04-30 DIAGNOSIS — F3289 Other specified depressive episodes: Secondary | ICD-10-CM | POA: Insufficient documentation

## 2014-04-30 DIAGNOSIS — I10 Essential (primary) hypertension: Secondary | ICD-10-CM | POA: Insufficient documentation

## 2014-04-30 DIAGNOSIS — Z79899 Other long term (current) drug therapy: Secondary | ICD-10-CM | POA: Insufficient documentation

## 2014-04-30 DIAGNOSIS — K219 Gastro-esophageal reflux disease without esophagitis: Secondary | ICD-10-CM | POA: Insufficient documentation

## 2014-04-30 DIAGNOSIS — Z8659 Personal history of other mental and behavioral disorders: Secondary | ICD-10-CM | POA: Insufficient documentation

## 2014-04-30 DIAGNOSIS — L89153 Pressure ulcer of sacral region, stage 3: Secondary | ICD-10-CM

## 2014-04-30 DIAGNOSIS — L8993 Pressure ulcer of unspecified site, stage 3: Secondary | ICD-10-CM | POA: Insufficient documentation

## 2014-04-30 DIAGNOSIS — G309 Alzheimer's disease, unspecified: Secondary | ICD-10-CM | POA: Insufficient documentation

## 2014-04-30 DIAGNOSIS — Z87448 Personal history of other diseases of urinary system: Secondary | ICD-10-CM | POA: Insufficient documentation

## 2014-04-30 DIAGNOSIS — L89109 Pressure ulcer of unspecified part of back, unspecified stage: Secondary | ICD-10-CM | POA: Insufficient documentation

## 2014-04-30 DIAGNOSIS — F0280 Dementia in other diseases classified elsewhere without behavioral disturbance: Secondary | ICD-10-CM | POA: Insufficient documentation

## 2014-04-30 DIAGNOSIS — R011 Cardiac murmur, unspecified: Secondary | ICD-10-CM | POA: Insufficient documentation

## 2014-04-30 NOTE — Discharge Instructions (Signed)
Please follow instruction below for care of pressure ulcer.  Follow up closely with your doctor and request for wound care management as these pressure ulcers are difficult to treat.  Apply barrier cream daily to decrease risk of skin break down.  Return if you notice signs of infection.    Pressure Ulcer A pressure ulcer is a sore that has formed from the breakdown of skin and exposure of deeper layers of tissue. It develops in areas of the body where there is unrelieved pressure. Pressure ulcers are usually found over a bony area, such as the shoulder blades, spine, lower back, hips, knees, ankles, and heels. Pressure ulcers vary in severity. Your health care provider may determine the severity (stage) of your pressure ulcer. The stages include:  Stage I--The skin is red, and when the skin is pressed, it stays red.  Stage II--The top layer of skin is gone, and there is a shallow, pink ulcer.  Stage III--The ulcer becomes deeper, and it is more difficult to see the whole wound. Also, there may be yellow or brown parts, as well as pink and red parts.  Stage IV--The ulcer may be deep and red, pink, brown, white, or yellow. Bone or muscle may be seen.  Unstageable pressure ulcer--The ulcer is covered almost completely with black, brown, or yellow tissue. It is not known how deep the ulcer is or what stage it is until this covering comes off.  Suspected deep tissue injury--A person's skin can be injured from pressure or pulling on the skin when his or her position is changed. The skin appears purple or maroon. There may not be an opening in the skin, but there could be a blood-filled blister. This deep tissue injury is often difficult to see in people with darker skin tones. The site may open and become deeper in time. However, early interventions will help the area heal and may prevent the area from opening. CAUSES  Pressure ulcers are caused by pressure against the skin that limits the flow of blood  to the skin and nearby tissues. There are many risk factors that can lead to pressure sores. RISK FACTORS  Decreased ability to move.  Decreased ability to feel pain or discomfort.  Excessive skin moisture from urine, stool, sweat, or secretions.  Poor nutrition.  Dehydration.  Tobacco, drug, or alcohol abuse.  Having someone pull on bedsheets that are under you, such as when health care workers are changing your position in a hospital bed.  Obesity.  Increased adult age.  Hospitalization in a critical care unit for longer than 4 days with use of medical devices.  Prolonged use of medical devices.  Critical illness.  Anemia.  Traumatic brain injury.  Spinal cord injury.  Stroke.  Diabetes.  Poor blood glucose control.  Low blood pressure (hypotension).  Low oxygen levels.  Medicines that reduce blood flow.  Infection. DIAGNOSIS  Your health care provider will diagnose your pressure ulcer based on its appearance. The health care provider may determine the stage of your pressure ulcer as well. Tests may be done to check for infection, to assess your circulation, or to check for other diseases, such as diabetes. TREATMENT  Treatment of your pressure ulcer begins with determining what stage the ulcer is in. Your treatment team may include your health care provider, a wound care specialist, a nutritionist, a physical therapist, and a Careers adviser. Possible treatments may include:   Moving or repositioning every 1-2 hours.  Using beds or mattresses to  shift your body weight and pressure points frequently.  Improving your diet.  Cleaning and bandaging (dressing) the open wound.  Giving antibiotic medicines.  Removing damaged tissue.  Surgery and sometimes skin grafts. HOME CARE INSTRUCTIONS  If you were hospitalized, follow the care plan that was started in the hospital.  Avoid staying in the same position for more than 2 hours. Use padding, devices, or  mattresses to cushion your pressure points as directed by your health care provider.  Eat a well-balanced diet. Take nutritional supplements and vitamins as directed by your health care provider.  Keep all follow-up appointments.  Only take over-the-counter or prescription medicines for pain, fever, or discomfort as directed by your health care provider. SEEK MEDICAL CARE IF:   Your pressure ulcer is not improving.  You do not know how to care for your pressure ulcer.  You notice other areas of redness on your skin.  You have a fever. SEEK IMMEDIATE MEDICAL CARE IF:   You have increasing redness, swelling, or pain in your pressure ulcer.  You notice pus coming from your pressure ulcer.  You notice a bad smell coming from the wound or dressing.  Your pressure ulcer opens up again. Document Released: 10/09/2005 Document Revised: 10/14/2013 Document Reviewed: 06/16/2013 [facility] Patient Information 2015 [place], [place]. This information is not intended to replace advice given to you by your health care provider. Make sure you discuss any questions you have with your health care provider.

## 2014-04-30 NOTE — ED Provider Notes (Signed)
CSN: [id number]     Arrival date & time 04/30/14  1215 History   First MD Initiated Contact with Patient 04/30/14 1424     Chief Complaint  Patient presents with  . Boil      (Consider location/radiation/quality/duration/timing/severity/associated sxs/prior Treatment) HPI  78 year old female who was brought here to the ER by his son for evaluation of a pressure ulcer on the sacral region. Patient with history of dementia, history obtained through son. Patient has a pressure ulcer in the sacral region. Patient lives with her son. He also notice some increasing changes to the sacral area for the past 8 days and today the skin slough off and some mild discharge but noted. No other skin breakdown noted. Son brought her to the ER just to make sure there is no obvious abscess of infection. No specific treatment tried. Son has been changes patient's position regularly in bed to prevent further pressure ulcer. She is bed bound. No fever or chills, no change in diet, no other complaint. Patient does have a primary care doctor who is aware of her pressure ulcer.   Past Medical History  Diagnosis Date  . Unspecified constipation   . Anxiety state, unspecified   . Colitis, enteritis, and gastroenteritis of presumed infectious origin 11/02/2011  . Pneumonia, organism unspecified 11/02/2011  . Cystitis, unspecified   . Impetigo   . Renal artery stenosis   . Dementia in conditions classified elsewhere with behavioral disturbance   . Hypercalcemia   . Reflux esophagitis   . Chest pain, unspecified   . Abnormality of gait   . Hyperosmolality and/or hypernatremia   . Unspecified disorder of liver   . Candidiasis of vulva and vagina   . Alzheimer's disease   . Chest pain, unspecified 08/19/2008  . Dementia in conditions classified elsewhere without behavioral disturbance   . Depressive disorder, not elsewhere classified   . Esophageal reflux   . Other and unspecified hyperlipidemia   . Loss of  weight   . Unspecified persistent mental disorders due to conditions classified elsewhere   . Reactive confusion   . Unspecified essential hypertension   . Other seborrheic keratosis   . Osteoarthrosis, unspecified whether generalized or localized, unspecified site   . Stiffness of joint, not elsewhere classified, unspecified site   . Memory loss 07/24/2007  . Headache(784.0)   . Undiagnosed cardiac murmurs    Past Surgical History  Procedure Laterality Date  . Cesarean section     History reviewed. No pertinent family history. History  Substance Use Topics  . Smoking status: Never Smoker   . Smokeless tobacco: Never Used  . Alcohol Use: No   OB History   Grav Para Term Preterm Abortions TAB SAB Ect Mult Living                 Review of Systems  Unable to perform ROS: Dementia      Allergies  Risperdal  Home Medications   Prior to Admission medications   Medication Sig Start Date End Date Taking? Authorizing Provider  acetaminophen (TYLENOL) 325 MG tablet Take 2 tablets (650 mg total) by mouth every 6 (six) hours as needed for mild pain (or Fever >/= 101). 02/20/14   Tora Kindred York, PA-C  alendronate (FOSAMAX) 70 MG tablet Take 1 tablet (70 mg total) by mouth every 7 (seven) days. Take with a full glass of water on an empty stomach. 10/28/13   Kimber Relic, MD  ALPRAZolam Prudy Feeler) 0.5 MG tablet TAKE  1 TABLET BY MOUTH 3 TIMES A DAY AND 1 TAB AT BEDTIME AS NEEDED FOR ANXIETY    Mahima Pandey, MD  clotrimazole-betamethasone (LOTRISONE) cream Apply 1 application topically 2 (two) times daily. To rash midback until resolved 04/02/14   [name] L Reed, DO  CRESTOR 10 MG tablet TAKE 1 TABLET DAILY FOR CHOLESTEROL 04/22/14   [name] L Reed, DO  diclofenac sodium (VOLTAREN) 1 % GEL Apply 4 g topically 4 (four) times daily as needed. For right knee pain 12/22/13   [name] L Reed, DO  hydrALAZINE (APRESOLINE) 50 MG tablet Take 1 tablet (50 mg total) by mouth 3 (three) times daily. 01/12/14    [name] L Reed, DO  losartan (COZAAR) 25 MG tablet Take 25 mg by mouth daily.    Historical Provider, MD  memantine (NAMENDA) 10 MG tablet Take 10 mg by mouth 2 (two) times daily.    Historical Provider, MD  metoprolol tartrate (LOPRESSOR) 25 MG tablet Take 1/2 tablet by mouth twice a day 12/22/13   Kermit Baloiffany L Reed, DO  Multiple Vitamins-Minerals (CENTRUM SILVER PO) Take 1 tablet by mouth daily.     Historical Provider, MD  nitroGLYCERIN (NITROSTAT) 0.4 MG SL tablet Place 0.4 mg under the tongue every 5 (five) minutes as needed for chest pain. Take one tablet sublingual every 5 minutes not to exceed three tablets for chest pain    Historical Provider, MD  omeprazole (PRILOSEC) 20 MG capsule Take 20 mg by mouth daily.    Historical Provider, MD  omeprazole (PRILOSEC) 20 MG capsule TAKE 1 CAPSULE DAILY TO REDUCE STOMACH ACID AND TO HELP PROTECT ESOPHAGUS    [name] L Reed, DO  rosuvastatin (CRESTOR) 10 MG tablet Take 10 mg by mouth daily.  04/09/13   Oneal GroutMahima Pandey, MD  sertraline (ZOLOFT) 100 MG tablet Take 100 mg by mouth daily.     Historical Provider, MD   BP 165/86  Pulse 89  Resp 15  Wt 145 lb (65.772 kg)  SpO2 100% Physical Exam  Nursing note and vitals reviewed. Constitutional:  Cachectic appearing African American female appears to be in no acute distress.  HENT:  Temporal wasting noted   Eyes: Conjunctivae and EOM are normal. Pupils are equal, round, and reactive to light.  Neck: Neck supple.  Skin:  Patient appears to have a stage III sacral decubitus ulcer with granulating tissue that appears well healing. No active drainage. No abscess. No surrounding erythema concerning for cellulitis. Nontender on palpation.  Psychiatric: She has a normal mood and affect.    ED Course  Procedures (including critical care time)   Patient with sacral ulcer, without signs of infection. I recommend barrier cream, and also for the family to stay vigilant on turning the patient appropriately to  decrease worsening pressure ulcer. Patient will also need to be managed closely by her primary care doctor and perhaps a wound nurse as these pressure ulcers are  difficult to fully treated. There is no systemic signs of infection such as abscess or cellulitis. She is stable for discharge. Care discussed with Dr. Clarene DukeMcManus.  Labs Review Labs Reviewed - No data to display  Imaging Review No results found.   EKG Interpretation None      MDM   Final diagnoses:  Sacral ulcer, stage III    BP 165/86  Pulse 89  Resp 15  Wt 145 lb (65.772 kg)  SpO2 100%     Fayrene HelperBowie Dorri Ozturk, PA-C 04/30/14 1525

## 2014-04-30 NOTE — ED Notes (Signed)
Pt presents with abscess to sacral area that son noted to be there x 8 days.  He reports during bath this morning, it began to drain with yellow drainage.  CBG was 96 this morning, no other skin breakdown noted.

## 2014-04-30 NOTE — ED Notes (Signed)
PT comfortable with discharge and follow up instructions. No prescriptions. Family given instructions on dressing and barrier cream application for patient's ulcer per EDPA request.

## 2014-05-02 ENCOUNTER — Other Ambulatory Visit: Payer: Self-pay | Admitting: Internal Medicine

## 2014-05-02 NOTE — ED Provider Notes (Signed)
Medical screening examination/treatment/procedure(s) were performed by non-physician practitioner and as supervising physician I was immediately available for consultation/collaboration.   EKG Interpretation None        Laray AngerKathleen M Nehal Shives, DO 05/02/14 1400

## 2014-05-05 ENCOUNTER — Other Ambulatory Visit: Payer: Self-pay | Admitting: Internal Medicine

## 2014-05-06 NOTE — Telephone Encounter (Signed)
Per patients daughter this was started in rehab. Dr Renato Gailseed is aware of this (per patient's daughter)

## 2014-05-25 ENCOUNTER — Telehealth: Payer: Self-pay | Admitting: *Deleted

## 2014-05-25 MED ORDER — SACCHAROMYCES BOULARDII 250 MG PO CAPS: 250.0000 mg | ORAL_CAPSULE | Freq: Two times a day (BID) | ORAL | Status: DC

## 2014-05-25 MED ORDER — DOXYCYCLINE HYCLATE 100 MG PO TABS: 100.0000 mg | ORAL_TABLET | Freq: Two times a day (BID) | ORAL | Status: DC

## 2014-05-25 NOTE — Telephone Encounter (Signed)
Denise Notified and agreed. Faxed Rx's into pharmacy and scheduled an appointment for Thursday with Joseph PieriniJessica Karam,NP

## 2014-05-25 NOTE — Telephone Encounter (Signed)
Patient daughter, Angelique BlonderDenise, called and stated that patient has had a bed sore on backside for awhile now and it has busted and has a foul odor to it. Wanted to know if they should take patient to ER or come to see you. Please Advise.

## 2014-05-25 NOTE — Telephone Encounter (Signed)
Let's get her in on Thursday to see me or Shanda BumpsJessica.  In the meantime, let's call in doxycycline 100mg  po bid for 10 days and florastor 250mg  po bid for 14 days.  I also recommend that they try to make sure she changes position at least every 2 hours to keep pressure off of the area.  If she develops a fever or mental status change from her baseline, they should take her to the hospital.

## 2014-05-28 ENCOUNTER — Ambulatory Visit (INDEPENDENT_AMBULATORY_CARE_PROVIDER_SITE_OTHER): Payer: Medicare Other | Admitting: Nurse Practitioner

## 2014-05-28 ENCOUNTER — Encounter: Payer: Self-pay | Admitting: Nurse Practitioner

## 2014-05-28 VITALS — BP 136/78 | HR 63 | Temp 97.6°F | Resp 12

## 2014-05-28 DIAGNOSIS — I701 Atherosclerosis of renal artery: Secondary | ICD-10-CM

## 2014-05-28 DIAGNOSIS — L89109 Pressure ulcer of unspecified part of back, unspecified stage: Secondary | ICD-10-CM

## 2014-05-28 DIAGNOSIS — L8993 Pressure ulcer of unspecified site, stage 3: Secondary | ICD-10-CM

## 2014-05-28 DIAGNOSIS — L89153 Pressure ulcer of sacral region, stage 3: Secondary | ICD-10-CM

## 2014-05-28 MED ORDER — SILVER SULFADIAZINE 1 % EX CREA: 1.0000 "application " | TOPICAL_CREAM | Freq: Every day | CUTANEOUS | Status: DC

## 2014-05-28 NOTE — Patient Instructions (Signed)
Apply silvadene cream to wound once daily and cover with dry gauze  Will need to go to wound care clinic for further treatment- will place a referral to be done ASAP   Use colace 100 mg 1-2 times daily for stools  Pressure Ulcer A pressure ulcer is a sore that has formed from the breakdown of skin and exposure of deeper layers of tissue. It develops in areas of the body where there is unrelieved pressure. Pressure ulcers are usually found over a bony area, such as the shoulder blades, spine, lower back, hips, knees, ankles, and heels. Pressure ulcers vary in severity. Your health care provider may determine the severity (stage) of your pressure ulcer. The stages include:  Stage I--The skin is red, and when the skin is pressed, it stays red.  Stage II--The top layer of skin is gone, and there is a shallow, pink ulcer.  Stage III--The ulcer becomes deeper, and it is more difficult to see the whole wound. Also, there may be yellow or brown parts, as well as pink and red parts.  Stage IV--The ulcer may be deep and red, pink, brown, white, or yellow. Bone or muscle may be seen.  Unstageable pressure ulcer--The ulcer is covered almost completely with black, brown, or yellow tissue. It is not known how deep the ulcer is or what stage it is until this covering comes off.  Suspected deep tissue injury--A person's skin can be injured from pressure or pulling on the skin when his or her position is changed. The skin appears purple or maroon. There may not be an opening in the skin, but there could be a blood-filled blister. This deep tissue injury is often difficult to see in people with darker skin tones. The site may open and become deeper in time. However, early interventions will help the area heal and may prevent the area from opening. CAUSES  Pressure ulcers are caused by pressure against the skin that limits the flow of blood to the skin and nearby tissues. There are many risk factors that can lead  to pressure sores. RISK FACTORS  Decreased ability to move.  Decreased ability to feel pain or discomfort.  Excessive skin moisture from urine, stool, sweat, or secretions.  Poor nutrition.  Dehydration.  Tobacco, drug, or alcohol abuse.  Having someone pull on bedsheets that are under you, such as when health care workers are changing your position in a hospital bed.  Obesity.  Increased adult age.  Hospitalization in a critical care unit for longer than 4 days with use of medical devices.  Prolonged use of medical devices.  Critical illness.  Anemia.  Traumatic brain injury.  Spinal cord injury.  Stroke.  Diabetes.  Poor blood glucose control.  Low blood pressure (hypotension).  Low oxygen levels.  Medicines that reduce blood flow.  Infection. DIAGNOSIS  Your health care provider will diagnose your pressure ulcer based on its appearance. The health care provider may determine the stage of your pressure ulcer as well. Tests may be done to check for infection, to assess your circulation, or to check for other diseases, such as diabetes. TREATMENT  Treatment of your pressure ulcer begins with determining what stage the ulcer is in. Your treatment team may include your health care provider, a wound care specialist, a nutritionist, a physical therapist, and a Careers adviser. Possible treatments may include:   Moving or repositioning every 1-2 hours.  Using beds or mattresses to shift your body weight and pressure points frequently.  Improving your diet.  Cleaning and bandaging (dressing) the open wound.  Giving antibiotic medicines.  Removing damaged tissue.  Surgery and sometimes skin grafts. HOME CARE INSTRUCTIONS  If you were hospitalized, follow the care plan that was started in the hospital.  Avoid staying in the same position for more than 2 hours. Use padding, devices, or mattresses to cushion your pressure points as directed by your health care  provider.  Eat a well-balanced diet. Take nutritional supplements and vitamins as directed by your health care provider.  Keep all follow-up appointments.  Only take over-the-counter or prescription medicines for pain, fever, or discomfort as directed by your health care provider. SEEK MEDICAL CARE IF:   Your pressure ulcer is not improving.  You do not know how to care for your pressure ulcer.  You notice other areas of redness on your skin.  You have a fever. SEEK IMMEDIATE MEDICAL CARE IF:   You have increasing redness, swelling, or pain in your pressure ulcer.  You notice pus coming from your pressure ulcer.  You notice a bad smell coming from the wound or dressing.  Your pressure ulcer opens up again. Document Released: 10/09/2005 Document Revised: 10/14/2013 Document Reviewed: 06/16/2013 [facility] Patient Information 2015 [place], [place]. This information is not intended to replace advice given to you by your health care provider. Make sure you discuss any questions you have with your health care provider.  Constipation Constipation is when a person has fewer than three bowel movements a week, has difficulty having a bowel movement, or has stools that are dry, hard, or larger than normal. As people grow older, constipation is more common. If you try to fix constipation with medicines that make you have a bowel movement (laxatives), the problem may get worse. Long-term laxative use may cause the muscles of the colon to become weak. A low-fiber diet, not taking in enough fluids, and taking certain medicines may make constipation worse.  CAUSES   Certain medicines, such as antidepressants, pain medicine, iron supplements, antacids, and water pills.   Certain diseases, such as diabetes, irritable bowel syndrome (IBS), thyroid disease, or depression.   Not drinking enough water.   Not eating enough fiber-rich foods.   Stress or travel.   Lack of physical activity or  exercise.   Ignoring the urge to have a bowel movement.   Using laxatives too much.  SIGNS AND SYMPTOMS   Having fewer than three bowel movements a week.   Straining to have a bowel movement.   Having stools that are hard, dry, or larger than normal.   Feeling full or bloated.   Pain in the lower abdomen.   Not feeling relief after having a bowel movement.  DIAGNOSIS  Your health care provider will take a medical history and perform a physical exam. Further testing may be done for severe constipation. Some tests may include:  A barium enema X-ray to examine your rectum, colon, and, sometimes, your small intestine.   A sigmoidoscopy to examine your lower colon.   A colonoscopy to examine your entire colon. TREATMENT  Treatment will depend on the severity of your constipation and what is causing it. Some dietary treatments include drinking more fluids and eating more fiber-rich foods. Lifestyle treatments may include regular exercise. If these diet and lifestyle recommendations do not help, your health care provider may recommend taking over-the-counter laxative medicines to help you have bowel movements. Prescription medicines may be prescribed if over-the-counter medicines do not work.  HOME CARE  INSTRUCTIONS   Eat foods that have a lot of fiber, such as fruits, vegetables, whole grains, and beans.  Limit foods high in fat and processed sugars, such as french fries, hamburgers, cookies, candies, and soda.   A fiber supplement may be added to your diet if you cannot get enough fiber from foods.   Drink enough fluids to keep your urine clear or pale yellow.   Exercise regularly or as directed by your health care provider.   Go to the restroom when you have the urge to go. Do not hold it.   Only take over-the-counter or prescription medicines as directed by your health care provider. Do not take other medicines for constipation without talking to your health  care provider first.  SEEK IMMEDIATE MEDICAL CARE IF:   You have bright red blood in your stool.   Your constipation lasts for more than 4 days or gets worse.   You have abdominal or rectal pain.   You have thin, pencil-like stools.   You have unexplained weight loss. MAKE SURE YOU:   Understand these instructions.  Will watch your condition.  Will get help right away if you are not doing well or get worse. Document Released: 07/07/2004 Document Revised: 10/14/2013 Document Reviewed: 07/21/2013 [facility] Patient Information 2015 [place], [place]. This information is not intended to replace advice given to you by your health care provider. Make sure you discuss any questions you have with your health care provider.

## 2014-05-28 NOTE — Progress Notes (Signed)
Patient ID: Sabrina Long, female   DOB: [birth year], 78 y.o.   MRN: [id number]    Allergies  Allergen Reactions  . Risperdal [Risperidone] Other (See Comments)    sedation    Chief Complaint  Patient presents with  . Wound Check    Wound is located at the top of buttocks, wound is worse- smells, drainage, and painful.   . Constipation    No BM x 3 days     HPI: Patient is a 78 y.o. female seen in the office today for follow up sacral wound; pt went to the ED were they reccommended barrier cream but the wound cont to become worse with increase drainage and odor. Now here to evaluate.  Also with constipation, not had a BM in several days  Hx provided by family  Review of Systems:  Unable to provide ROS due to dementia   Past Medical History  Diagnosis Date  . Unspecified constipation   . Anxiety state, unspecified   . Colitis, enteritis, and gastroenteritis of presumed infectious origin 11/02/2011  . Pneumonia, organism unspecified 11/02/2011  . Cystitis, unspecified   . Impetigo   . Renal artery stenosis   . Dementia in conditions classified elsewhere with behavioral disturbance   . Hypercalcemia   . Reflux esophagitis   . Chest pain, unspecified   . Abnormality of gait   . Hyperosmolality and/or hypernatremia   . Unspecified disorder of liver   . Candidiasis of vulva and vagina   . Alzheimer's disease   . Chest pain, unspecified 08/19/2008  . Dementia in conditions classified elsewhere without behavioral disturbance   . Depressive disorder, not elsewhere classified   . Esophageal reflux   . Other and unspecified hyperlipidemia   . Loss of weight   . Unspecified persistent mental disorders due to conditions classified elsewhere   . Reactive confusion   . Unspecified essential hypertension   . Other seborrheic keratosis   . Osteoarthrosis, unspecified whether generalized or localized, unspecified site   . Stiffness of joint, not elsewhere classified,  unspecified site   . Memory loss 07/24/2007  . Headache(784.0)   . Undiagnosed cardiac murmurs    Past Surgical History  Procedure Laterality Date  . Cesarean section     Social History:   reports that she has never smoked. She has never used smokeless tobacco. She reports that she does not drink alcohol or use illicit drugs.  History reviewed. No pertinent family history.  Medications: Patient's Medications  New Prescriptions   No medications on file  Previous Medications   ACETAMINOPHEN (TYLENOL) 325 MG TABLET    Take 2 tablets (650 mg total) by mouth every 6 (six) hours as needed for mild pain (or Fever >/= 101).   ALENDRONATE (FOSAMAX) 70 MG TABLET    Take 1 tablet (70 mg total) by mouth every 7 (seven) days. Take with a full glass of water on an empty stomach.   ALPRAZOLAM (XANAX) 0.5 MG TABLET    TAKE 1 TABLET BY MOUTH 3 TIMES A DAY AND 1 TAB AT BEDTIME AS NEEDED FOR ANXIETY   CLOTRIMAZOLE-BETAMETHASONE (LOTRISONE) CREAM    Apply 1 application topically 2 (two) times daily. To rash midback until resolved   CRESTOR 10 MG TABLET    TAKE 1 TABLET DAILY FOR CHOLESTEROL   DICLOFENAC SODIUM (VOLTAREN) 1 % GEL    Apply 4 g topically 4 (four) times daily as needed. For right knee pain   DOXYCYCLINE (VIBRA-TABS) 100 MG TABLET  Take 1 tablet (100 mg total) by mouth 2 (two) times daily.   HYDRALAZINE (APRESOLINE) 50 MG TABLET    Take 1 tablet (50 mg total) by mouth 3 (three) times daily.   LOSARTAN (COZAAR) 25 MG TABLET    Take 25 mg by mouth daily.   MEMANTINE (NAMENDA) 10 MG TABLET    Take 10 mg by mouth 2 (two) times daily.   METOPROLOL TARTRATE (LOPRESSOR) 25 MG TABLET    Take 1/2 tablet by mouth twice a day   MULTIPLE VITAMINS-MINERALS (CENTRUM SILVER PO)    Take 1 tablet by mouth daily.    NITROGLYCERIN (NITROSTAT) 0.4 MG SL TABLET    Place 0.4 mg under the tongue every 5 (five) minutes as needed for chest pain. Take one tablet sublingual every 5 minutes not to exceed three tablets  for chest pain   NOVOLOG FLEXPEN 100 UNIT/ML FLEXPEN    USE FOUR TIMES DAILY ( WITH MEALS AND AT BEDTIME) PER ATTACHED SLIDING SCALE   OMEPRAZOLE (PRILOSEC) 20 MG CAPSULE    Take 20 mg by mouth daily.   OMEPRAZOLE (PRILOSEC) 20 MG CAPSULE    TAKE 1 CAPSULE DAILY TO REDUCE STOMACH ACID AND TO HELP PROTECT ESOPHAGUS   ROSUVASTATIN (CRESTOR) 10 MG TABLET    Take 10 mg by mouth daily.    SACCHAROMYCES BOULARDII (FLORASTOR) 250 MG CAPSULE    Take 1 capsule (250 mg total) by mouth 2 (two) times daily.   SERTRALINE (ZOLOFT) 100 MG TABLET    Take 100 mg by mouth daily.   Modified Medications   No medications on file  Discontinued Medications   No medications on file     Physical Exam:  Filed Vitals:   05/28/14 0928  BP: 136/78  Pulse: 63  Temp: 97.6 F (36.4 C)  TempSrc: Oral  Resp: 12  SpO2: 95%    Physical Exam  Constitutional: She is well-developed, well-nourished, and in no distress.  HENT:  Mouth/Throat: Oropharynx is clear and moist. No oropharyngeal exudate.  Eyes: Conjunctivae and EOM are normal. Pupils are equal, round, and reactive to light.  Neck: Normal range of motion. Neck supple.  Cardiovascular: Normal rate, regular rhythm and normal heart sounds.   Pulmonary/Chest: Effort normal and breath sounds normal. No respiratory distress.  Abdominal: Soft. Bowel sounds are normal. She exhibits no distension. There is no tenderness.  Musculoskeletal: She exhibits no edema and no tenderness.  Neurological: She is alert.  Skin: Skin is warm and dry.  1 1/2 x 1 with 1/4 depth with eschar at base and purulent discharge around margins and foul odor   Psychiatric: She has a flat affect.    Labs reviewed: Basic Metabolic Panel:  Recent Labs  16/07/9603/02/15 1301  02/17/14 1935  02/19/14 0656 02/19/14 1415 02/20/14 0745  NA 146*  < >  --   < > 152* 153* 145  K 4.1  < >  --   < > 3.4* 3.5* 3.3*  CL 107  < >  --   < > 115* 115* 109  CO2 22  < >  --   < > 24 22 23   GLUCOSE 125*   < >  --   < > 133* 138* 145*  BUN 14  < >  --   < > 10 10 7   CREATININE 0.85  < >  --   < > 0.90 0.89 0.81  CALCIUM 9.9  < >  --   < > 9.7 9.4 9.2  TSH 2.180  --  2.330  --   --   --   --   < > = values in this interval not displayed. Liver Function Tests:  Recent Labs  01/22/14 1301  AST 15  ALT 12  ALKPHOS 71  BILITOT <0.2  PROT 7.6   No results found for this basename: LIPASE, AMYLASE,  in the last 8760 hours  Recent Labs  02/17/14 1935  AMMONIA 54   CBC:  Recent Labs  01/22/14 1301 02/17/14 1047 02/18/14 0444 02/20/14 0745  WBC 6.8 8.7 8.0 6.8  NEUTROABS 3.4 5.7  --   --   HGB 12.6 13.8 11.7* 12.0  HCT 38.7 45.3 37.6 37.8  MCV 97 102.7* 100.5* 97.2  PLT 295 139* 131* 156   Lipid Panel: No results found for this basename: CHOL, HDL, LDLCALC, TRIG, CHOLHDL, LDLDIRECT,  in the last 8760 hours TSH:  Recent Labs  01/22/14 1301 02/17/14 1935  TSH 2.180 2.330   A1C: Lab Results  Component Value Date   HGBA1C 6.1* 02/17/2014     Assessment/Plan  1. Decubitus ulcer of sacral region, stage 3 -would cultured, to cont on doxycycline for now - would care: silver sulfADIAZINE (SILVADENE) 1 % cream; Apply 1 application topically daily.  -to keep area clean and dry with dressing applied  - AMB referral to wound care center ASAP - Aerobic culture - nutrition and pressure reduction discussed in great detail  2. Constipation  Use colace 100 mg 1-2 times daily for stools increase hydration -information provided   Return precautions given and to keep scheduled follow up

## 2014-06-01 LAB — AEROBIC CULTURE

## 2014-06-02 ENCOUNTER — Other Ambulatory Visit: Payer: Self-pay | Admitting: *Deleted

## 2014-06-02 MED ORDER — AMOXICILLIN-POT CLAVULANATE 875-125 MG PO TABS: 1.0000 | ORAL_TABLET | Freq: Two times a day (BID) | ORAL | Status: DC

## 2014-06-18 ENCOUNTER — Other Ambulatory Visit: Payer: Self-pay | Admitting: Internal Medicine

## 2014-06-22 ENCOUNTER — Encounter (HOSPITAL_BASED_OUTPATIENT_CLINIC_OR_DEPARTMENT_OTHER): Payer: Medicare Other | Attending: Plastic Surgery

## 2014-06-22 DIAGNOSIS — F3289 Other specified depressive episodes: Secondary | ICD-10-CM | POA: Diagnosis not present

## 2014-06-22 DIAGNOSIS — K2100 Gastro-esophageal reflux disease with esophagitis, without bleeding: Secondary | ICD-10-CM | POA: Insufficient documentation

## 2014-06-22 DIAGNOSIS — L8993 Pressure ulcer of unspecified site, stage 3: Secondary | ICD-10-CM | POA: Insufficient documentation

## 2014-06-22 DIAGNOSIS — Z79899 Other long term (current) drug therapy: Secondary | ICD-10-CM | POA: Insufficient documentation

## 2014-06-22 DIAGNOSIS — L89109 Pressure ulcer of unspecified part of back, unspecified stage: Secondary | ICD-10-CM | POA: Diagnosis present

## 2014-06-22 DIAGNOSIS — F411 Generalized anxiety disorder: Secondary | ICD-10-CM | POA: Diagnosis not present

## 2014-06-22 DIAGNOSIS — F329 Major depressive disorder, single episode, unspecified: Secondary | ICD-10-CM | POA: Insufficient documentation

## 2014-06-22 DIAGNOSIS — Z794 Long term (current) use of insulin: Secondary | ICD-10-CM | POA: Insufficient documentation

## 2014-06-23 NOTE — Consult Note (Signed)
Sabrina Long, Sabrina Long NO.:  [id number]  MEDICAL RECORD NO.:  [id number]  LOCATION:  FOOT                         FACILITY:  [facility]  PHYSICIAN:  Glenna Fellows, MD   DATE OF BIRTH:  1936/08/09  DATE OF CONSULTATION:  06/22/2014 DATE OF DISCHARGE:                                CONSULTATION   CHIEF COMPLAINT:  Pressure ulceration of the sacrum.  HISTORY OF PRESENT ILLNESS:  The patient is a 78 year old nonambulatory female that is accompanied today by her son and daughter-in-law.  The patient's past medical history is significant for dementia and her primary full time care giver at this time is her son.  Her son has been completing all wound care of her ulceration over her sacrum.  This has been present for approximately 2-3 months time in total.  They have been using Silvadene to the wound presently.  They note not significant amount of drainage. The patient is continent of both stool and urine.  She lives with her son.  They report that until recently she had a very hard mattress and they bought a specialty tight mattress that is softer, however, does not have an air loss mattress.  They report that she has a good appetite. There were no recent nutrition labs present.  They report that she has had previous ulcerations over her trochanters, however, these are gone on to heal by themselves.  She has not had any surgery for these previously.  PAST MEDICAL HISTORY: 1. Alzheimer. 2. Gastroesophageal reflux. 3. Hyperlipidemia. 4. Osteoarthritis. 5. Hypertension.  PAST SURGICAL HISTORY:  C-section.  SOCIAL HISTORY:  The patient was never a smoker.  ALLERGIES:  The patient has allergy to [facility] which was listed as sedation.  MEDICATIONS:  Fosamax, Tylenol, Xanax, Crestor, hydralazine, Cozaar, Namenda, Lopressor, omeprazole, and NovoLog.  Review of her chart includes a wound culture of her buttocks dated May 28, 2014, that grew out Proteus,  Enterococcus, and Staphylococcus.  LABORATORY DATA:  There are no other recent laboratories except for a hemoglobin A1c dated February 17, 2014, which was 6.1.  There is no recent imaging of her pelvis.  PHYSICAL EXAMINATION:  VITAL SIGNS:  Blood pressure is 100/84, pulse 97, temperature is 97.8, blood sugar is 112, height is 5 feet 5 inches, weight is 136 pounds.  Over sacrum, she has a stage III ulceration measured as 2.7 x 1.5 x 1.8 cm.  There is undermining from 3-5 o'clock position up to 1.4 cm.  Base of the wound is completely granulated.  There is a small amount of slough and necrotic skin present and this was excised sharply after application of topical anesthetic.  We will institute alginate with foam dressing to be changed 3 times weekly and p.r.n. depending on Saturation. We will order baseline labs. We reviewed additional protein supplementation including whey protein as she is currently not on any protein supplementation.  Given the difficulty the patient has with regards to transportation and her associated dementia, we will plan for followup in 2 week's time.  The family is agreeable to a low air loss mattress if this is possible, order was placed today.  An additional prescription was also written for a Roho cushion for her  wheelchair.  The son reports that he has thrown away her wheelchair cushion as it was quite hard in nature and they are not using it at all. Again, counseled regarding offloading measures including every 15 minutes even while she is sitting in chair or in her bed and plan for followup in 2 week's time.          ______________________________ Glenna Fellows, MD MBA     BT/MEDQ  D:  06/22/2014  T:  06/23/2014  Job:  [id number]

## 2014-06-27 ENCOUNTER — Other Ambulatory Visit: Payer: Self-pay | Admitting: Internal Medicine

## 2014-07-03 ENCOUNTER — Other Ambulatory Visit: Payer: Self-pay | Admitting: Internal Medicine

## 2014-07-06 ENCOUNTER — Encounter (HOSPITAL_BASED_OUTPATIENT_CLINIC_OR_DEPARTMENT_OTHER): Payer: Medicare Other | Attending: Plastic Surgery

## 2014-07-06 ENCOUNTER — Ambulatory Visit: Payer: Medicare Other | Admitting: Internal Medicine

## 2014-07-06 DIAGNOSIS — Z0289 Encounter for other administrative examinations: Secondary | ICD-10-CM

## 2014-07-06 DIAGNOSIS — L8993 Pressure ulcer of unspecified site, stage 3: Secondary | ICD-10-CM | POA: Insufficient documentation

## 2014-07-06 DIAGNOSIS — L89109 Pressure ulcer of unspecified part of back, unspecified stage: Secondary | ICD-10-CM | POA: Diagnosis not present

## 2014-07-07 ENCOUNTER — Encounter: Payer: Self-pay | Admitting: Internal Medicine

## 2014-07-20 DIAGNOSIS — L8993 Pressure ulcer of unspecified site, stage 3: Secondary | ICD-10-CM | POA: Diagnosis not present

## 2014-07-20 DIAGNOSIS — L89109 Pressure ulcer of unspecified part of back, unspecified stage: Secondary | ICD-10-CM | POA: Diagnosis not present

## 2014-07-27 ENCOUNTER — Emergency Department (HOSPITAL_COMMUNITY): Payer: Medicare Other

## 2014-07-27 ENCOUNTER — Inpatient Hospital Stay (HOSPITAL_COMMUNITY)
Admission: EM | Admit: 2014-07-27 | Discharge: 2014-08-01 | DRG: 176 | Disposition: A | Discharge status: Home Health | Payer: Medicare Other | Attending: Internal Medicine | Admitting: Internal Medicine

## 2014-07-27 ENCOUNTER — Encounter: Payer: Self-pay | Admitting: Internal Medicine

## 2014-07-27 ENCOUNTER — Encounter (HOSPITAL_COMMUNITY): Payer: Self-pay | Admitting: Emergency Medicine

## 2014-07-27 ENCOUNTER — Ambulatory Visit (HOSPITAL_COMMUNITY)
Admission: RE | Admit: 2014-07-27 | Discharge: 2014-07-27 | Disposition: A | Discharge status: Home / Self Care | Payer: Medicare Other | Source: Ambulatory Visit | Attending: Internal Medicine | Admitting: Internal Medicine

## 2014-07-27 ENCOUNTER — Ambulatory Visit (INDEPENDENT_AMBULATORY_CARE_PROVIDER_SITE_OTHER): Payer: Medicare Other | Admitting: Internal Medicine

## 2014-07-27 VITALS — BP 132/74 | HR 106 | Temp 97.7°F | Resp 12

## 2014-07-27 DIAGNOSIS — I701 Atherosclerosis of renal artery: Secondary | ICD-10-CM

## 2014-07-27 DIAGNOSIS — M7989 Other specified soft tissue disorders: Secondary | ICD-10-CM

## 2014-07-27 DIAGNOSIS — G309 Alzheimer's disease, unspecified: Secondary | ICD-10-CM | POA: Diagnosis present

## 2014-07-27 DIAGNOSIS — I2699 Other pulmonary embolism without acute cor pulmonale: Secondary | ICD-10-CM | POA: Diagnosis present

## 2014-07-27 DIAGNOSIS — F0280 Dementia in other diseases classified elsewhere without behavioral disturbance: Secondary | ICD-10-CM | POA: Diagnosis present

## 2014-07-27 DIAGNOSIS — R011 Cardiac murmur, unspecified: Secondary | ICD-10-CM | POA: Diagnosis present

## 2014-07-27 DIAGNOSIS — I10 Essential (primary) hypertension: Secondary | ICD-10-CM | POA: Diagnosis present

## 2014-07-27 DIAGNOSIS — I82402 Acute embolism and thrombosis of unspecified deep veins of left lower extremity: Secondary | ICD-10-CM | POA: Diagnosis present

## 2014-07-27 DIAGNOSIS — R627 Adult failure to thrive: Secondary | ICD-10-CM | POA: Diagnosis present

## 2014-07-27 DIAGNOSIS — K769 Liver disease, unspecified: Secondary | ICD-10-CM | POA: Diagnosis present

## 2014-07-27 DIAGNOSIS — K219 Gastro-esophageal reflux disease without esophagitis: Secondary | ICD-10-CM | POA: Diagnosis present

## 2014-07-27 DIAGNOSIS — R269 Unspecified abnormalities of gait and mobility: Secondary | ICD-10-CM | POA: Diagnosis present

## 2014-07-27 DIAGNOSIS — I639 Cerebral infarction, unspecified: Secondary | ICD-10-CM

## 2014-07-27 DIAGNOSIS — Z23 Encounter for immunization: Secondary | ICD-10-CM

## 2014-07-27 DIAGNOSIS — J301 Allergic rhinitis due to pollen: Secondary | ICD-10-CM

## 2014-07-27 DIAGNOSIS — L89153 Pressure ulcer of sacral region, stage 3: Secondary | ICD-10-CM

## 2014-07-27 DIAGNOSIS — E785 Hyperlipidemia, unspecified: Secondary | ICD-10-CM | POA: Diagnosis present

## 2014-07-27 DIAGNOSIS — M1990 Unspecified osteoarthritis, unspecified site: Secondary | ICD-10-CM | POA: Diagnosis present

## 2014-07-27 DIAGNOSIS — F419 Anxiety disorder, unspecified: Secondary | ICD-10-CM | POA: Diagnosis present

## 2014-07-27 DIAGNOSIS — R600 Localized edema: Secondary | ICD-10-CM

## 2014-07-27 DIAGNOSIS — F329 Major depressive disorder, single episode, unspecified: Secondary | ICD-10-CM | POA: Diagnosis present

## 2014-07-27 DIAGNOSIS — R51 Headache: Secondary | ICD-10-CM | POA: Diagnosis present

## 2014-07-27 DIAGNOSIS — Z888 Allergy status to other drugs, medicaments and biological substances status: Secondary | ICD-10-CM

## 2014-07-27 DIAGNOSIS — D649 Anemia, unspecified: Secondary | ICD-10-CM | POA: Diagnosis present

## 2014-07-27 LAB — BASIC METABOLIC PANEL
Anion gap: 14 (ref 5–15)
BUN: 11 mg/dL (ref 6–23)
CHLORIDE: 107 meq/L (ref 96–112)
CO2: 23 meq/L (ref 19–32)
CREATININE: 0.62 mg/dL (ref 0.50–1.10)
Calcium: 9.7 mg/dL (ref 8.4–10.5)
GFR calc non Af Amer: 85 mL/min — ABNORMAL LOW (ref >90)
Glucose, Bld: 123 mg/dL — ABNORMAL HIGH (ref 70–99)
POTASSIUM: 3.9 meq/L (ref 3.7–5.3)
Sodium: 144 mEq/L (ref 137–147)

## 2014-07-27 LAB — CBC WITH DIFFERENTIAL/PLATELET
BASOS ABS: 0 10*3/uL (ref 0.0–0.1)
Basophils Relative: 1 % (ref 0–1)
Eosinophils Absolute: 0 10*3/uL (ref 0.0–0.7)
Eosinophils Relative: 0 % (ref 0–5)
HCT: 37.7 % (ref 36.0–46.0)
HEMOGLOBIN: 12.2 g/dL (ref 12.0–15.0)
LYMPHS PCT: 30 % (ref 12–46)
Lymphs Abs: 2.6 10*3/uL (ref 0.7–4.0)
MCH: 30 pg (ref 26.0–34.0)
MCHC: 32.4 g/dL (ref 30.0–36.0)
MCV: 92.9 fL (ref 78.0–100.0)
MONO ABS: 0.4 10*3/uL (ref 0.1–1.0)
Monocytes Relative: 5 % (ref 3–12)
NEUTROS ABS: 5.6 10*3/uL (ref 1.7–7.7)
NEUTROS PCT: 64 % (ref 43–77)
Platelets: 285 10*3/uL (ref 150–400)
RBC: 4.06 MIL/uL (ref 3.87–5.11)
RDW: 14.7 % (ref 11.5–15.5)
WBC: 8.7 10*3/uL (ref 4.0–10.5)

## 2014-07-27 LAB — PROTIME-INR
INR: 1.21 (ref 0.00–1.49)
Prothrombin Time: 15.3 seconds — ABNORMAL HIGH (ref 11.6–15.2)

## 2014-07-27 MED ORDER — HEPARIN (PORCINE) IN NACL 100-0.45 UNIT/ML-% IJ SOLN
1000.0000 [IU]/h | INTRAMUSCULAR | Status: DC
  Administered 2014-07-27: 1100 [IU]/h via INTRAVENOUS
  Administered 2014-07-28: 1000 [IU]/h via INTRAVENOUS
  Filled 2014-07-27 (×3): qty 250

## 2014-07-27 MED ORDER — PATIENT'S GUIDE TO USING COUMADIN BOOK
Freq: Once | Status: AC
  Administered 2014-07-27: 23:00:00
  Filled 2014-07-27: qty 1

## 2014-07-27 MED ORDER — MEMANTINE HCL 10 MG PO TABS
10.0000 mg | ORAL_TABLET | Freq: Two times a day (BID) | ORAL | Status: DC
  Administered 2014-07-27 – 2014-08-01 (×10): 10 mg via ORAL
  Filled 2014-07-27 (×11): qty 1

## 2014-07-27 MED ORDER — IOHEXOL 350 MG/ML SOLN
80.0000 mL | Freq: Once | INTRAVENOUS | Status: AC | PRN
  Administered 2014-07-27: 80 mL via INTRAVENOUS

## 2014-07-27 MED ORDER — SODIUM CHLORIDE 0.9 % IV BOLUS (SEPSIS)
500.0000 mL | Freq: Once | INTRAVENOUS | Status: AC
  Administered 2014-07-27: 500 mL via INTRAVENOUS

## 2014-07-27 MED ORDER — WARFARIN SODIUM 5 MG PO TABS
5.0000 mg | ORAL_TABLET | Freq: Once | ORAL | Status: AC
  Administered 2014-07-27: 5 mg via ORAL
  Filled 2014-07-27: qty 1

## 2014-07-27 MED ORDER — METOPROLOL TARTRATE 25 MG PO TABS
25.0000 mg | ORAL_TABLET | Freq: Two times a day (BID) | ORAL | Status: DC
  Administered 2014-07-27 – 2014-08-01 (×10): 25 mg via ORAL
  Filled 2014-07-27 (×13): qty 1

## 2014-07-27 MED ORDER — HYDRALAZINE HCL 50 MG PO TABS
50.0000 mg | ORAL_TABLET | Freq: Three times a day (TID) | ORAL | Status: DC
  Administered 2014-07-27: 50 mg via ORAL
  Filled 2014-07-27 (×3): qty 1

## 2014-07-27 MED ORDER — WARFARIN - PHARMACIST DOSING INPATIENT
Freq: Every day | Status: DC
  Administered 2014-07-29 – 2014-07-31 (×2)

## 2014-07-27 MED ORDER — HYDRALAZINE HCL 50 MG PO TABS
50.0000 mg | ORAL_TABLET | Freq: Three times a day (TID) | ORAL | Status: DC
  Administered 2014-07-28 – 2014-08-01 (×14): 50 mg via ORAL
  Filled 2014-07-27 (×15): qty 1

## 2014-07-27 MED ORDER — HEPARIN BOLUS VIA INFUSION
4000.0000 [IU] | Freq: Once | INTRAVENOUS | Status: AC
  Administered 2014-07-27: 4000 [IU] via INTRAVENOUS
  Filled 2014-07-27: qty 4000

## 2014-07-27 MED ORDER — ALPRAZOLAM 0.25 MG PO TABS
0.2500 mg | ORAL_TABLET | Freq: Two times a day (BID) | ORAL | Status: DC
  Administered 2014-07-27: 0.5 mg via ORAL
  Administered 2014-07-28 – 2014-07-30 (×5): 0.25 mg via ORAL
  Administered 2014-07-31 (×2): 0.5 mg via ORAL
  Administered 2014-08-01: 0.25 mg via ORAL
  Filled 2014-07-27 (×2): qty 2
  Filled 2014-07-27: qty 1
  Filled 2014-07-27: qty 2
  Filled 2014-07-27 (×3): qty 1
  Filled 2014-07-27 (×2): qty 2
  Filled 2014-07-27: qty 1
  Filled 2014-07-27: qty 2

## 2014-07-27 MED ORDER — PANTOPRAZOLE SODIUM 40 MG PO TBEC
40.0000 mg | DELAYED_RELEASE_TABLET | Freq: Every day | ORAL | Status: DC
  Administered 2014-07-28 – 2014-08-01 (×5): 40 mg via ORAL
  Filled 2014-07-27 (×4): qty 1

## 2014-07-27 MED ORDER — WARFARIN VIDEO: Freq: Once | Status: DC

## 2014-07-27 MED ORDER — DICLOFENAC SODIUM 1 % TD GEL
1.0000 "application " | Freq: Two times a day (BID) | TRANSDERMAL | Status: DC
  Administered 2014-07-27 – 2014-08-01 (×8): 1 via TOPICAL
  Filled 2014-07-27: qty 100

## 2014-07-27 MED ORDER — ROSUVASTATIN CALCIUM 10 MG PO TABS
10.0000 mg | ORAL_TABLET | Freq: Every day | ORAL | Status: DC
  Administered 2014-07-28 – 2014-08-01 (×5): 10 mg via ORAL
  Filled 2014-07-27 (×5): qty 1

## 2014-07-27 MED ORDER — SERTRALINE HCL 100 MG PO TABS
100.0000 mg | ORAL_TABLET | Freq: Every day | ORAL | Status: DC
  Administered 2014-07-27 – 2014-07-31 (×5): 100 mg via ORAL
  Filled 2014-07-27 (×6): qty 1

## 2014-07-27 MED ORDER — SODIUM CHLORIDE 0.9 % IV SOLN
INTRAVENOUS | Status: AC
  Administered 2014-07-27: 19:00:00 via INTRAVENOUS

## 2014-07-27 MED ORDER — NITROGLYCERIN 0.4 MG SL SUBL: 0.4000 mg | SUBLINGUAL_TABLET | SUBLINGUAL | Status: DC | PRN

## 2014-07-27 MED ORDER — ADULT MULTIVITAMIN W/MINERALS CH
1.0000 | ORAL_TABLET | Freq: Every day | ORAL | Status: DC
  Administered 2014-07-28 – 2014-08-01 (×5): 1 via ORAL
  Filled 2014-07-27 (×5): qty 1

## 2014-07-27 MED ORDER — LOSARTAN POTASSIUM 25 MG PO TABS
25.0000 mg | ORAL_TABLET | Freq: Every day | ORAL | Status: DC
  Administered 2014-07-28 – 2014-08-01 (×5): 25 mg via ORAL
  Filled 2014-07-27 (×5): qty 1

## 2014-07-27 NOTE — ED Notes (Signed)
Pt sent here by Dr. Bufford Sabrina Long for positive DVT to left lower extremity, confirmed today on ultrasound. MD concerned for possible PE. Pt has hx of dementia, minimally verbal. Pt refusing oral temperature and EKG in triage. Per family, pt is at baseline.

## 2014-07-27 NOTE — ED Notes (Addendum)
Called [facility] about Patient Transferred. EMT Apolinar JunesBrandon transporting patient upstairs.

## 2014-07-27 NOTE — ED Notes (Signed)
PA at the bedside.

## 2014-07-27 NOTE — Progress Notes (Signed)
Patient ID: Sabrina Long, female   DOB: [birth year], 78 y.o.   MRN: [id number]   Location:  [facility]  Code Status: full code  Allergies  Allergen Reactions  . Risperdal [Risperidone] Other (See Comments)    sedation    Chief Complaint  Patient presents with  . Acute Visit    Left lower side swelling, no known injuries   . FYI    Patient unable to weigh, no stability on feet- in wheelchair     HPI: Patient is a 78 y.o. black female seen in the office today for acute visit due to left lower side swelling.    Left leg to hip.  Has been that way for 4 days.  Was so swollen it felt like it would bust, but it has come down quite a bit.  Her son moves her every two hrs. Wound on her buttock is healing.    Her son has not noticed any shortness of breath.  She denies chest pain, but is not very verbal today.  Her dementia has continued to progress.  She is incontinent of urine.    Review of Systems:  Review of Systems  Constitutional: Positive for malaise/fatigue. Negative for fever.  HENT: Positive for congestion. Negative for hearing loss.        Rhinorrhea and redness of eyes (son notes after windows were opened last night)  Eyes: Negative for blurred vision.  Respiratory: Negative for shortness of breath.   Cardiovascular: Negative for chest pain and leg swelling.  Gastrointestinal: Negative for abdominal pain.  Genitourinary: Negative for dysuria.  Musculoskeletal: Positive for joint pain and myalgias.       Left leg pain;  Very painful at knee  Neurological: Negative for dizziness.  Psychiatric/Behavioral: Positive for memory loss.       Late stage dementia, dependent in adls    Past Medical History  Diagnosis Date  . Unspecified constipation   . Anxiety state, unspecified   . Colitis, enteritis, and gastroenteritis of presumed infectious origin 11/02/2011  . Pneumonia, organism unspecified 11/02/2011  . Cystitis,  unspecified   . Impetigo   . Renal artery stenosis   . Dementia in conditions classified elsewhere with behavioral disturbance   . Hypercalcemia   . Reflux esophagitis   . Chest pain, unspecified   . Abnormality of gait   . Hyperosmolality and/or hypernatremia   . Unspecified disorder of liver   . Candidiasis of vulva and vagina   . Alzheimer's disease   . Chest pain, unspecified 08/19/2008  . Dementia in conditions classified elsewhere without behavioral disturbance   . Depressive disorder, not elsewhere classified   . Esophageal reflux   . Other and unspecified hyperlipidemia   . Loss of weight   . Unspecified persistent mental disorders due to conditions classified elsewhere   . Reactive confusion   . Unspecified essential hypertension   . Other seborrheic keratosis   . Osteoarthrosis, unspecified whether generalized or localized, unspecified site   . Stiffness of joint, not elsewhere classified, unspecified site   . Memory loss 07/24/2007  . Headache(784.0)   . Undiagnosed cardiac murmurs     Past Surgical History  Procedure Laterality Date  . Cesarean section      Social History:   reports that she has never smoked. She has never used smokeless tobacco. She reports that she does not drink alcohol or use illicit drugs.  History reviewed. No pertinent family history.  Medications:  Patient's Medications  New Prescriptions   No medications on file  Previous Medications   ACETAMINOPHEN (TYLENOL) 325 MG TABLET    Take 2 tablets (650 mg total) by mouth every 6 (six) hours as needed for mild pain (or Fever >/= 101).   ALENDRONATE (FOSAMAX) 70 MG TABLET    Take 1 tablet (70 mg total) by mouth every 7 (seven) days. Take with a full glass of water on an empty stomach.   ALPRAZOLAM (XANAX) 0.5 MG TABLET    TAKE 1 TABLET BY MOUTH 3 TIMES A DAY AND 1 TAB AT BEDTIME AS NEEDED FOR ANXIETY   CRESTOR 10 MG TABLET    TAKE 1 TABLET DAILY FOR CHOLESTEROL   DICLOFENAC SODIUM (VOLTAREN)  1 % GEL    Apply 4 g topically 4 (four) times daily as needed. For right knee pain   HYDRALAZINE (APRESOLINE) 50 MG TABLET    Take 1 tablet (50 mg total) by mouth 3 (three) times daily.   LOSARTAN (COZAAR) 25 MG TABLET    TAKE 1 TABLET DAILY FOR BLOOD PRESSURE   MEMANTINE (NAMENDA) 10 MG TABLET    Take 10 mg by mouth 2 (two) times daily.   METOPROLOL TARTRATE (LOPRESSOR) 25 MG TABLET    TAKE ONE-HALF (1/2) TABLET TWICE A DAY   MULTIPLE VITAMINS-MINERALS (CENTRUM SILVER PO)    Take 1 tablet by mouth daily.    NITROGLYCERIN (NITROSTAT) 0.4 MG SL TABLET    Place 0.4 mg under the tongue every 5 (five) minutes as needed for chest pain. Take one tablet sublingual every 5 minutes not to exceed three tablets for chest pain   NOVOLOG FLEXPEN 100 UNIT/ML FLEXPEN    USE FOUR TIMES DAILY ( WITH MEALS AND AT BEDTIME) PER ATTACHED SLIDING SCALE   OMEPRAZOLE (PRILOSEC) 20 MG CAPSULE    TAKE 1 CAPSULE DAILY TO REDUCE STOMACH ACID AND TO HELP PROTECT ESOPHAGUS   SERTRALINE (ZOLOFT) 100 MG TABLET    Take 100 mg by mouth daily.   Modified Medications   No medications on file  Discontinued Medications   AMOXICILLIN-CLAVULANATE (AUGMENTIN) 875-125 MG PER TABLET    Take 1 tablet by mouth 2 (two) times daily.   CLOTRIMAZOLE-BETAMETHASONE (LOTRISONE) CREAM    Apply 1 application topically 2 (two) times daily. To rash midback until resolved   DOXYCYCLINE (VIBRA-TABS) 100 MG TABLET    Take 1 tablet (100 mg total) by mouth 2 (two) times daily.   OMEPRAZOLE (PRILOSEC) 20 MG CAPSULE    Take 20 mg by mouth daily.   ROSUVASTATIN (CRESTOR) 10 MG TABLET    Take 10 mg by mouth daily.    SACCHAROMYCES BOULARDII (FLORASTOR) 250 MG CAPSULE    Take 1 capsule (250 mg total) by mouth 2 (two) times daily.   SILVER SULFADIAZINE (SILVADENE) 1 % CREAM    Apply 1 application topically daily.     Physical Exam: Filed Vitals:   07/27/14 1057  BP: 132/74  Pulse: 106  Temp: 97.7 F (36.5 C)  TempSrc: Oral  Resp: 12  Physical Exam    Constitutional: No distress.  HENT:  Mouth/Throat: Oropharynx is clear and moist.  Eyes:  Erythema of conjunctiva bilaterally  Neck: Neck supple.  Cardiovascular: Regular rhythm, normal heart sounds and intact distal pulses.   tachycardic  Pulmonary/Chest: Effort normal and breath sounds normal. No respiratory distress. She has no wheezes.  sats normal  Abdominal: Soft. Bowel sounds are normal. She exhibits no distension and no mass. There is no  tenderness.  Musculoskeletal: Normal range of motion. She exhibits tenderness.  Of left knee  Neurological: She is alert.  Skin: Skin is warm and dry.  Pressure ulcer improved from prior assessment     Labs reviewed: Basic Metabolic Panel:  Recent Labs  16/10/96 1301  02/17/14 1935  02/19/14 0656 02/19/14 1415 02/20/14 0745  NA 146*  < >  --   < > 152* 153* 145  K 4.1  < >  --   < > 3.4* 3.5* 3.3*  CL 107  < >  --   < > 115* 115* 109  CO2 22  < >  --   < > 24 22 23   GLUCOSE 125*  < >  --   < > 133* 138* 145*  BUN 14  < >  --   < > 10 10 7   CREATININE 0.85  < >  --   < > 0.90 0.89 0.81  CALCIUM 9.9  < >  --   < > 9.7 9.4 9.2  TSH 2.180  --  2.330  --   --   --   --   < > = values in this interval not displayed. Liver Function Tests:  Recent Labs  01/22/14 1301  AST 15  ALT 12  ALKPHOS 71  BILITOT <0.2  PROT 7.6   No results found for this basename: LIPASE, AMYLASE,  in the last 8760 hours  Recent Labs  02/17/14 1935  AMMONIA 54   CBC:  Recent Labs  01/22/14 1301 02/17/14 1047 02/18/14 0444 02/20/14 0745  WBC 6.8 8.7 8.0 6.8  NEUTROABS 3.4 5.7  --   --   HGB 12.6 13.8 11.7* 12.0  HCT 38.7 45.3 37.6 37.8  MCV 97 102.7* 100.5* 97.2  PLT 295 139* 131* 156   Lipid Panel: No results found for this basename: CHOL, HDL, LDLCALC, TRIG, CHOLHDL, LDLDIRECT,  in the last 8760 hours Lab Results  Component Value Date   HGBA1C 6.1* 02/17/2014     Assessment/Plan 1. Left leg swelling -suspicious for DVT due to  diffuse left leg swelling -pt's dementia makes it difficult to figure out exactly what hurts but seems to be her chronic knee pain - Lower Extremity Venous Duplex Left; Future  2. Allergic rhinitis due to pollen -due to windows being opened  3. Decubitus ulcer of sacral region, stage 3 -is healing with home care nurse mgt  4. Alzheimer's disease -late stage, less and less verbal, is declining -her son is her primary caretaker and assists her with all adls at this point -getting to where she would benefit from hospice care, but will need two daughters and son on board with this plan  5. Need for prophylactic vaccination and inoculation against influenza -flu shot given  Labs/tests ordered:   Orders Placed This Encounter  Procedures  . Lower Extremity Venous Duplex Left    New left leg swelling for 4 days, r/o DVT    Standing Status: Future     Number of Occurrences:      Standing Expiration Date: 07/27/2015    Order Specific Question:  Laterality    Answer:  Left    Order Specific Question:  Where should this test be performed:    Answer:  [facility]    Next appt:  3 mos  Deziray Nabi L. Jasalyn Frysinger, D.O. Geriatrics [facility] [address], [place] [address] Cell Phone (Mon-Fri 8am-5pm):  [phone] On Call:  [phone] & follow prompts after 5pm & weekends Office Phone:  [phone] Office Fax:  [phone]

## 2014-07-27 NOTE — ED Notes (Signed)
Spoke with Diet. Ordered patient tray.

## 2014-07-27 NOTE — ED Provider Notes (Signed)
Medical screening examination/treatment/procedure(s) were performed by non-physician practitioner and as supervising physician I was immediately available for consultation/collaboration.   EKG Interpretation None       Donice Alperin, MD 07/27/14 1643 

## 2014-07-27 NOTE — Progress Notes (Signed)
Pt's HR in 140's ST, pt is asymptomatic. Dr. Blake DivineAkula, MD notified

## 2014-07-27 NOTE — H&P (Signed)
[facility] History and Physical  HAYLEY HORN ZOX:[id number] DOB: February 15, 1936 DOA: 07/27/2014  Referring physician: edp PCP: Bufford Spikes, DO   Chief Complaint: left lower extremity DVT.   HPI: Sabrina Long is a 78 y.o. female WITH h/o dementia, chest pain, went to PCP office for left LE swelling, was found to have LLE DVT . She was sent to ED for evaluation of PE. Ct angiogram of the chest revealed bilateral PE, she was started on heparin drip and referred to [place] for admission. She is non verbal. Most of the hstory obtained from the son and the EDP   Review of Systems:  Could not be obtained.  Past Medical History  Diagnosis Date  . Unspecified constipation   . Anxiety state, unspecified   . Colitis, enteritis, and gastroenteritis of presumed infectious origin 11/02/2011  . Pneumonia, organism unspecified 11/02/2011  . Cystitis, unspecified   . Impetigo   . Renal artery stenosis   . Dementia in conditions classified elsewhere with behavioral disturbance   . Hypercalcemia   . Reflux esophagitis   . Chest pain, unspecified   . Abnormality of gait   . Hyperosmolality and/or hypernatremia   . Unspecified disorder of liver   . Candidiasis of vulva and vagina   . Alzheimer's disease   . Chest pain, unspecified 08/19/2008  . Dementia in conditions classified elsewhere without behavioral disturbance   . Depressive disorder, not elsewhere classified   . Esophageal reflux   . Other and unspecified hyperlipidemia   . Loss of weight   . Unspecified persistent mental disorders due to conditions classified elsewhere   . Reactive confusion   . Unspecified essential hypertension   . Other seborrheic keratosis   . Osteoarthrosis, unspecified whether generalized or localized, unspecified site   . Stiffness of joint, not elsewhere classified, unspecified site   . Memory loss 07/24/2007  . Headache(784.0)   . Undiagnosed cardiac murmurs    Past Surgical History    Procedure Laterality Date  . Cesarean section     Social History:  reports that she has never smoked. She has never used smokeless tobacco. She reports that she does not drink alcohol or use illicit drugs.  Allergies  Allergen Reactions  . Risperdal [Risperidone] Other (See Comments)    sedation    No family history on file.   Prior to Admission medications   Medication Sig Start Date End Date Taking? Authorizing Provider  alendronate (FOSAMAX) 70 MG tablet Take 70 mg by mouth once a week. Take with a full glass of water on an empty stomach.  On Tuesdays   Yes Historical Provider, MD  ALPRAZolam Prudy Feeler) 0.5 MG tablet Take 0.25-0.5 mg by mouth 2 (two) times daily. Take 1/2 tablet (0.25 mg) every morning and 1 tablet (0.5 mg) every night   Yes Historical Provider, MD  diclofenac sodium (VOLTAREN) 1 % GEL Apply 1 application topically 2 (two) times daily. Apply to knees and hips   Yes Historical Provider, MD  hydrALAZINE (APRESOLINE) 50 MG tablet Take 1 tablet (50 mg total) by mouth 3 (three) times daily. 01/12/14  Yes [name] L Reed, DO  losartan (COZAAR) 25 MG tablet Take 25 mg by mouth daily.   Yes Historical Provider, MD  memantine (NAMENDA) 10 MG tablet Take 10 mg by mouth 2 (two) times daily.   Yes Historical Provider, MD  metoprolol tartrate (LOPRESSOR) 25 MG tablet Take 25 mg by mouth 2 (two) times daily.    Yes Historical Provider,  MD  Multiple Vitamin (MULTIVITAMIN WITH MINERALS) TABS tablet Take 1 tablet by mouth daily. Centrum Silver   Yes Historical Provider, MD  nitroGLYCERIN (NITROSTAT) 0.4 MG SL tablet Place 0.4 mg under the tongue every 5 (five) minutes as needed for chest pain. Take one tablet sublingual every 5 minutes not to exceed three tablets for chest pain   Yes Historical Provider, MD  omeprazole (PRILOSEC) 20 MG capsule Take 20 mg by mouth daily.   Yes Historical Provider, MD  rosuvastatin (CRESTOR) 10 MG tablet Take 10 mg by mouth daily.   Yes Historical Provider, MD   sertraline (ZOLOFT) 100 MG tablet Take 100 mg by mouth at bedtime.    Yes Historical Provider, MD   Physical Exam: Filed Vitals:   07/27/14 1500 07/27/14 1543 07/27/14 1545 07/27/14 1600  BP:  157/81 137/86 150/80  Pulse:  110 106 124  Resp:  14 19 18   Height: 5' 4.17" (1.63 m)     Weight: 65.8 kg (145 lb 1 oz) 62.596 kg (138 lb)    SpO2:  92% 93% 94%    Wt Readings from Last 3 Encounters:  07/27/14 62.596 kg (138 lb)  04/30/14 65.772 kg (145 lb)  04/02/14 60.782 kg (134 lb)    General:  Appears calm and comfortable  Eyes: PERRL, normal lids, irises & conjunctiva Neck: no LAD, masses or thyromegaly Cardiovascular: RRR, no m/r/g. No LE edema. Respiratory: CTA bilaterally, no w/r/r. Normal respiratory effort. Abdomen: soft, ntnd Skin: no rash or induration seen on limited exam Musculoskeletal: LEFT LLE swollen.             Labs on Admission:  Basic Metabolic Panel:  Recent Labs Lab 07/27/14 1337  NA 144  K 3.9  CL 107  CO2 23  GLUCOSE 123*  BUN 11  CREATININE 0.62  CALCIUM 9.7   Liver Function Tests: No results found for this basename: AST, ALT, ALKPHOS, BILITOT, PROT, ALBUMIN,  in the last 168 hours No results found for this basename: LIPASE, AMYLASE,  in the last 168 hours No results found for this basename: AMMONIA,  in the last 168 hours CBC:  Recent Labs Lab 07/27/14 1337  WBC 8.7  NEUTROABS 5.6  HGB 12.2  HCT 37.7  MCV 92.9  PLT 285   Cardiac Enzymes: No results found for this basename: CKTOTAL, CKMB, CKMBINDEX, TROPONINI,  in the last 168 hours  BNP (last 3 results) No results found for this basename: PROBNP,  in the last 8760 hours CBG: No results found for this basename: GLUCAP,  in the last 168 hours  Radiological Exams on Admission: Ct Angio Chest W/cm &/or Wo Cm  07/27/2014   CLINICAL DATA:  History of deep venous thrombosis. The patient is unresponsive.  EXAM: CT ANGIOGRAPHY CHEST WITH CONTRAST  TECHNIQUE: Multidetector CT imaging of  the chest was performed using the standard protocol during bolus administration of intravenous contrast. Multiplanar CT image reconstructions and MIPs were obtained to evaluate the vascular anatomy.  CONTRAST:  80 mL OMNIPAQUE IOHEXOL 350 MG/ML SOLN  COMPARISON:  PA and lateral chest 05/03/2013 appear  FINDINGS: The study is positive for bilateral pulmonary emboli. Saddle embolus is seen at the bifurcation of the right main pulmonary artery with clot also identified and anterior left upper lobe and descending left intralobar branches. The right ventricle to left ventricle ratio is 1.5 consistent with right heart strain. No pleural or pericardial effusion. Heart size is normal. No axillary, hilar or mediastinal lymphadenopathy. No calcific coronary  atherosclerosis is identified. The lungs demonstrate only mild dependent atelectasis. Incidentally imaged upper abdomen shows calcification in the posterior right hepatic lobe. No lytic or sclerotic bony lesion is identified.  Review of the MIP images confirms the above findings.  IMPRESSION: Study is positive for pulmonary embolus with evidence of right heart strain.  Critical Value/emergent results were called by telephone at the time of interpretation on 07/27/2014 at 3:19 pm to [facility], PA, who verbally acknowledged these results.   Electronically Signed   By: Drusilla Kanner M.D.   On: 07/27/2014 15:43    EKG: sinus tachycardia with non specific t wave abnormalities.   Assessment/Plan Active Problems:   Bilateral pulmonary embolism  Bilateral PE/ left LLE DVT: Resume heparin. And coumadin from tonight.  Echocardiogram.    Dementia: resume home medications.        Code Status: FULL CODE DVT Prophylaxis: Family Communication: SON at bedside Disposition Plan: admit to tele.   Time spent: 62 MIN  [facility] Pager [phone]

## 2014-07-27 NOTE — ED Provider Notes (Signed)
CSN: [id number]     Arrival date & time 07/27/14  1254 History   First MD Initiated Contact with Patient 07/27/14 1321     Chief Complaint  Patient presents with  . DVT   HPI  Patient is a non-verbal demented 78 y.o. Bedridden female who presents to the ED with her son who is her primary care giver from their PCP office for concern for PE.  Patient was diagnosed today with a left DVT by ultrasound today and was found to be tachycardic.  Patient has been at baseline per her son who noticed only some left leg swelling.  Patient has had no evidence of increased work of breathing and coughing.  Son denies patient complaining about pain or having hemoptysis.  Past Medical History  Diagnosis Date  . Unspecified constipation   . Anxiety state, unspecified   . Colitis, enteritis, and gastroenteritis of presumed infectious origin 11/02/2011  . Pneumonia, organism unspecified 11/02/2011  . Cystitis, unspecified   . Impetigo   . Renal artery stenosis   . Dementia in conditions classified elsewhere with behavioral disturbance   . Hypercalcemia   . Reflux esophagitis   . Chest pain, unspecified   . Abnormality of gait   . Hyperosmolality and/or hypernatremia   . Unspecified disorder of liver   . Candidiasis of vulva and vagina   . Alzheimer's disease   . Chest pain, unspecified 08/19/2008  . Dementia in conditions classified elsewhere without behavioral disturbance   . Depressive disorder, not elsewhere classified   . Esophageal reflux   . Other and unspecified hyperlipidemia   . Loss of weight   . Unspecified persistent mental disorders due to conditions classified elsewhere   . Reactive confusion   . Unspecified essential hypertension   . Other seborrheic keratosis   . Osteoarthrosis, unspecified whether generalized or localized, unspecified site   . Stiffness of joint, not elsewhere classified, unspecified site   . Memory loss 07/24/2007  . Headache(784.0)   . Undiagnosed cardiac  murmurs    Past Surgical History  Procedure Laterality Date  . Cesarean section     No family history on file. History  Substance Use Topics  . Smoking status: Never Smoker   . Smokeless tobacco: Never Used  . Alcohol Use: No   OB History   Grav Para Term Preterm Abortions TAB SAB Ect Mult Living                 Review of Systems  Level 5 caveat, patient is non-verbal and demented  Allergies  Risperdal  Home Medications   Prior to Admission medications   Medication Sig Start Date End Date Taking? Authorizing Provider  acetaminophen (TYLENOL) 325 MG tablet Take 2 tablets (650 mg total) by mouth every 6 (six) hours as needed for mild pain (or Fever >/= 101). 02/20/14  Yes Tora Kindred York, PA-C  ALPRAZolam (XANAX) 0.5 MG tablet Take 0.5 mg by mouth 4 (four) times daily as needed for anxiety. 1 tablet three times daily and 1 tablet at bedtime as needed for anxiety   Yes Historical Provider, MD  insulin aspart (NOVOLOG) 100 UNIT/ML FlexPen Inject into the skin 4 (four) times daily -  with meals and at bedtime. Per sliding scale   Yes Historical Provider, MD  losartan (COZAAR) 25 MG tablet Take 25 mg by mouth daily.   Yes Historical Provider, MD  memantine (NAMENDA) 10 MG tablet Take 10 mg by mouth 2 (two) times daily.   Yes  Historical Provider, MD  metoprolol tartrate (LOPRESSOR) 25 MG tablet Take by mouth 2 (two) times daily.   Yes Historical Provider, MD  Multiple Vitamins-Minerals (CENTRUM SILVER PO) Take 1 tablet by mouth daily.    Yes Historical Provider, MD  nitroGLYCERIN (NITROSTAT) 0.4 MG SL tablet Place 0.4 mg under the tongue every 5 (five) minutes as needed for chest pain. Take one tablet sublingual every 5 minutes not to exceed three tablets for chest pain   Yes Historical Provider, MD  omeprazole (PRILOSEC) 20 MG capsule Take 20 mg by mouth daily.   Yes Historical Provider, MD  rosuvastatin (CRESTOR) 10 MG tablet Take 10 mg by mouth daily.   Yes Historical Provider, MD   sertraline (ZOLOFT) 100 MG tablet Take 100 mg by mouth daily.    Yes Historical Provider, MD  alendronate (FOSAMAX) 70 MG tablet Take 1 tablet (70 mg total) by mouth every 7 (seven) days. Take with a full glass of water on an empty stomach. 10/28/13   Kimber RelicArthur G Green, MD  diclofenac sodium (VOLTAREN) 1 % GEL Apply 4 g topically 4 (four) times daily as needed. For right knee pain 12/22/13   [name] L Reed, DO  hydrALAZINE (APRESOLINE) 50 MG tablet Take 1 tablet (50 mg total) by mouth 3 (three) times daily. 01/12/14   [name] L Reed, DO   BP 128/72  Pulse 121  Resp 18  Ht 5' 4.17" (1.63 m)  Wt 145 lb 1 oz (65.8 kg)  BMI 24.77 kg/m2  SpO2 94% Physical Exam  Nursing note and vitals reviewed. Constitutional: She appears well-developed and well-nourished. No distress.  HENT:  Head: Normocephalic and atraumatic.  Mouth/Throat: Oropharynx is clear and moist. No oropharyngeal exudate.  Eyes: Conjunctivae are normal. Pupils are equal, round, and reactive to light. No scleral icterus.  Neck: Normal range of motion. Neck supple. No JVD present. No thyromegaly present.  Cardiovascular: Normal rate, regular rhythm, normal heart sounds and intact distal pulses.  Exam reveals no gallop and no friction rub.   No murmur heard. Pulmonary/Chest: Effort normal and breath sounds normal. No respiratory distress. She has no wheezes. She has no rales. She exhibits no tenderness.  Abdominal: Soft. Bowel sounds are normal. She exhibits no distension and no mass. There is no tenderness. There is no rebound and no guarding.  Lymphadenopathy:    She has no cervical adenopathy.  Neurological: She is alert.  Patient is non-verbal and is difficult to perform neurological examination.  Patient is moving all extremities.    Skin: Skin is warm and dry. She is not diaphoretic.  Well healing pressure ulcer on the buttock without surrounding erythema or discharge.      ED Course  Procedures (including critical care  time) Labs Review Labs Reviewed  BASIC METABOLIC PANEL - Abnormal; Notable for the following:    Glucose, Bld 123 (*)    GFR calc non Af Amer 85 (*)    All other components within normal limits  PROTIME-INR - Abnormal; Notable for the following:    Prothrombin Time 15.3 (*)    All other components within normal limits  CBC WITH DIFFERENTIAL    Imaging Review No results found.   EKG Interpretation None      MDM   Final diagnoses:  Bilateral pulmonary embolism  Alzheimer's disease   Patient is a 78 y.o. Female who is demented with new diagnosis of DVT.  Physical exam reveals no evidence of distress.  Vitals reveal tachycardia with SpO2 of 94%  on RA.  Basic labs appear to be at baseline.  Given DVT a CT angio was performed which showed bilateral PE with right heart strain.  Patient started on heparin drip.  I spoke with Dr. Blake Divine at 3:35 pm who will admit the patient to telemetry for bilateral PE with right heart strain.  Patient is a full code with pressors, intubation, and CPR.  I have spoken with her son who agrees to a full code.  Patient admitted to tele at this time.      Eben Burow, PA-C 07/27/14 [phone]

## 2014-07-27 NOTE — Progress Notes (Signed)
New order for I&O cath for UA, pt's family doesn't want pt to be catherized. Pt is incontinent will continue to prompt pt to urinate in the bedpan.

## 2014-07-27 NOTE — Consult Note (Addendum)
ANTICOAGULATION CONSULT NOTE - Initial Consult  Pharmacy Consult for Heparin Indication: pulmonary embolus and DVT  Allergies  Allergen Reactions  . Risperdal [Risperidone] Other (See Comments)    sedation    Patient Measurements: Height: 5' 4.17" (163 cm) Weight: 138 lb (62.596 kg) IBW/kg (Calculated) : 55.1 Heparin Dosing Weight: 62.5kg  Vital Signs: Temp: 97.7 F (36.5 C) (10/05 1057) Temp Source: Oral (10/05 1057) BP: 157/81 mmHg (10/05 1543) Pulse Rate: 110 (10/05 1543)  Labs:  Recent Labs  07/27/14 1337  HGB 12.2  HCT 37.7  PLT 285  LABPROT 15.3*  INR 1.21  CREATININE 0.62    Estimated Creatinine Clearance: 52 ml/min (by C-G formula based on Cr of 0.62).   Medical History: Past Medical History  Diagnosis Date  . Unspecified constipation   . Anxiety state, unspecified   . Colitis, enteritis, and gastroenteritis of presumed infectious origin 11/02/2011  . Pneumonia, organism unspecified 11/02/2011  . Cystitis, unspecified   . Impetigo   . Renal artery stenosis   . Dementia in conditions classified elsewhere with behavioral disturbance   . Hypercalcemia   . Reflux esophagitis   . Chest pain, unspecified   . Abnormality of gait   . Hyperosmolality and/or hypernatremia   . Unspecified disorder of liver   . Candidiasis of vulva and vagina   . Alzheimer's disease   . Chest pain, unspecified 08/19/2008  . Dementia in conditions classified elsewhere without behavioral disturbance   . Depressive disorder, not elsewhere classified   . Esophageal reflux   . Other and unspecified hyperlipidemia   . Loss of weight   . Unspecified persistent mental disorders due to conditions classified elsewhere   . Reactive confusion   . Unspecified essential hypertension   . Other seborrheic keratosis   . Osteoarthrosis, unspecified whether generalized or localized, unspecified site   . Stiffness of joint, not elsewhere classified, unspecified site   . Memory loss  07/24/2007  . Headache(784.0)   . Undiagnosed cardiac murmurs    Medications:  No anticoagulants pta  Assessment: 78yof presents to the ED with LLE DVT (confirmed by outpatient ultrasound). CT angio also positive for bilateral PEs with right heart strain. She will begin IV heparin. Baseline labs wnl.  Goal of Therapy:  Heparin level 0.3-0.7 units/ml Monitor platelets by anticoagulation protocol: Yes   Plan:  1) Heparin bolus 4000 units x 1 2) Heparin drip at 1100 units/hr 3) 8 hour heparin level 4) Daily heparin level and CBC  Fredrik RiggerMarkle, Jennifer Sue 07/27/2014,3:49 PM  Addendum: Pharmacy also asked to start Coumadin.  Baseline INR WNL. Plan: Coumadin 5 mg po x 1 Start Coumadin education.  Tad MooreJessica Tabatha Razzano, Pharm D, BCPS  Clinical Pharmacist Pager [phone]  07/27/2014 6:49 PM

## 2014-07-27 NOTE — Progress Notes (Signed)
VASCULAR LAB PRELIMINARY  PRELIMINARY  PRELIMINARY  PRELIMINARY  Bilateral lower extremity venous Dopplers completed.    Preliminary report:  There is acute, occlusive DVT noted throughout the left lower extremity, including the posterior tibial, popliteal, femoral, common femoral, and saphenofemoral junction. No propagation noted to the right lower extremity.   Ersa Delaney, RVT 07/27/2014, 12:56 PM

## 2014-07-27 NOTE — Progress Notes (Signed)
Patient ID: Sabrina Long, female   DOB: 12-01-1935, 78 y.o.   MRN: [id number] Received call report from vascular indicating that pt has diffuse clot in left lower extremity.  Due to her initial tachycardia, irregular breathing pattern and increased sedation during the visit, as well as this large clot, I have referred her to the ED to rule out PE.

## 2014-07-27 NOTE — ED Notes (Signed)
Received report from Scott, RN at this time.  

## 2014-07-28 DIAGNOSIS — I379 Nonrheumatic pulmonary valve disorder, unspecified: Secondary | ICD-10-CM

## 2014-07-28 LAB — CBC
HCT: 32.9 % — ABNORMAL LOW (ref 36.0–46.0)
HEMOGLOBIN: 10.5 g/dL — AB (ref 12.0–15.0)
MCH: 29.6 pg (ref 26.0–34.0)
MCHC: 31.9 g/dL (ref 30.0–36.0)
MCV: 92.7 fL (ref 78.0–100.0)
Platelets: 272 10*3/uL (ref 150–400)
RBC: 3.55 MIL/uL — AB (ref 3.87–5.11)
RDW: 14.6 % (ref 11.5–15.5)
WBC: 7.6 10*3/uL (ref 4.0–10.5)

## 2014-07-28 LAB — PROTIME-INR
INR: 1.18 (ref 0.00–1.49)
Prothrombin Time: 15 seconds (ref 11.6–15.2)

## 2014-07-28 LAB — HEPARIN LEVEL (UNFRACTIONATED)
HEPARIN UNFRACTIONATED: 0.87 [IU]/mL — AB (ref 0.30–0.70)
HEPARIN UNFRACTIONATED: 0.72 [IU]/mL — AB (ref 0.30–0.70)
Heparin Unfractionated: 0.53 IU/mL (ref 0.30–0.70)

## 2014-07-28 LAB — MRSA PCR SCREENING: MRSA BY PCR: NEGATIVE

## 2014-07-28 MED ORDER — HEPARIN (PORCINE) IN NACL 100-0.45 UNIT/ML-% IJ SOLN
950.0000 [IU]/h | INTRAMUSCULAR | Status: DC
  Filled 2014-07-28 (×4): qty 250

## 2014-07-28 MED ORDER — WARFARIN SODIUM 5 MG PO TABS
5.0000 mg | ORAL_TABLET | Freq: Once | ORAL | Status: AC
  Administered 2014-07-28: 5 mg via ORAL
  Filled 2014-07-28: qty 1

## 2014-07-28 MED ORDER — PNEUMOCOCCAL VAC POLYVALENT 25 MCG/0.5ML IJ INJ
0.5000 mL | INJECTION | INTRAMUSCULAR | Status: AC
  Administered 2014-07-31: 0.5 mL via INTRAMUSCULAR
  Filled 2014-07-28: qty 0.5

## 2014-07-28 NOTE — Discharge Instructions (Addendum)
Information on my medicine - Coumadin   (Warfarin)  This medication education was reviewed with me or my healthcare representative as part of my discharge preparation.   Why was Coumadin prescribed for you? Coumadin was prescribed for you because you have a blood clot or a medical condition that can cause an increased risk of forming blood clots. Blood clots can cause serious health problems by blocking the flow of blood to the heart, lung, or brain. Coumadin can prevent harmful blood clots from forming. As a reminder your indication for Coumadin is:   Pulmonary Embolism Treatment  What test will check on my response to Coumadin? While on Coumadin (warfarin) you will need to have an INR test regularly to ensure that your dose is keeping you in the desired range. The INR (international normalized ratio) number is calculated from the result of the laboratory test called prothrombin time (PT).  If an INR APPOINTMENT HAS NOT ALREADY BEEN MADE FOR YOU please schedule an appointment to have this lab work done by your health care provider within 7 days. Your INR goal is usually a number between:  2 to 3 or your provider may give you a more narrow range like 2-2.5.  Ask your health care provider during an office visit what your goal INR is.  What  do you need to  know  About  COUMADIN? Take Coumadin (warfarin) exactly as prescribed by your healthcare provider about the same time each day.  DO NOT stop taking without talking to the doctor who prescribed the medication.  Stopping without other blood clot prevention medication to take the place of Coumadin may increase your risk of developing a new clot or stroke.  Get refills before you run out.  What do you do if you miss a dose? If you miss a dose, take it as soon as you remember on the same day then continue your regularly scheduled regimen the next day.  Do not take two doses of Coumadin at the same time.  Important Safety Information A possible side  effect of Coumadin (Warfarin) is an increased risk of bleeding. You should call your healthcare provider right away if you experience any of the following:   Bleeding from an injury or your nose that does not stop.   Unusual colored urine (red or dark brown) or unusual colored stools (red or black).   Unusual bruising for unknown reasons.   A serious fall or if you hit your head (even if there is no bleeding).  Some foods or medicines interact with Coumadin (warfarin) and might alter your response to warfarin. To help avoid this:   Eat a balanced diet, maintaining a consistent amount of Vitamin K.   Notify your provider about major diet changes you plan to make.   Avoid alcohol or limit your intake to 1 drink for women and 2 drinks for men per day. (1 drink is 5 oz. wine, 12 oz. beer, or 1.5 oz. liquor.)  Make sure that ANY health care provider who prescribes medication for you knows that you are taking Coumadin (warfarin).  Also make sure the healthcare provider who is monitoring your Coumadin knows when you have started a new medication including herbals and non-prescription products.  Coumadin (Warfarin)  Major Drug Interactions  Increased Warfarin Effect Decreased Warfarin Effect  Alcohol (large quantities) Antibiotics (esp. Septra/Bactrim, Flagyl, Cipro) Amiodarone (Cordarone) Aspirin (ASA) Cimetidine (Tagamet) Megestrol (Megace) NSAIDs (ibuprofen, naproxen, etc.) Piroxicam (Feldene) Propafenone (Rythmol SR) Propranolol (Inderal) Isoniazid (INH) Posaconazole (  Noxafil) Barbiturates (Phenobarbital) Carbamazepine (Tegretol) Chlordiazepoxide (Librium) Cholestyramine (Questran) Griseofulvin Oral Contraceptives Rifampin Sucralfate (Carafate) Vitamin K   Coumadin (Warfarin) Major Herbal Interactions  Increased Warfarin Effect Decreased Warfarin Effect  Garlic Ginseng Ginkgo biloba Coenzyme Q10 Green tea St. Johns wort    Coumadin (Warfarin) FOOD Interactions  Eat a  consistent number of servings per week of foods HIGH in Vitamin K (1 serving =  cup)  Collards (cooked, or boiled & drained) Kale (cooked, or boiled & drained) Mustard greens (cooked, or boiled & drained) Parsley *serving size only =  cup Spinach (cooked, or boiled & drained) Swiss chard (cooked, or boiled & drained) Turnip greens (cooked, or boiled & drained)  Eat a consistent number of servings per week of foods MEDIUM-HIGH in Vitamin K (1 serving = 1 cup)  Asparagus (cooked, or boiled & drained) Broccoli (cooked, boiled & drained, or raw & chopped) Brussel sprouts (cooked, or boiled & drained) *serving size only =  cup Lettuce, raw (green leaf, endive, romaine) Spinach, raw Turnip greens, raw & chopped   These websites have more information on Coumadin (warfarin):  http://www.king-russell.com/; https://www.hines.net/;   Deep Vein Thrombosis A deep vein thrombosis (DVT) is a blood clot that develops in the deep, larger veins of the leg, arm, or pelvis. These are more dangerous than clots that might form in veins near the surface of the body. A DVT can lead to serious and even life-threatening complications if the clot breaks off and travels in the bloodstream to the lungs.  A DVT can damage the valves in your leg veins so that instead of flowing upward, the blood pools in the lower leg. This is called post-thrombotic syndrome, and it can result in pain, swelling, discoloration, and sores on the leg. CAUSES Usually, several things contribute to the formation of blood clots. Contributing factors include:  The flow of blood slows down.  The inside of the vein is damaged in some way.  You have a condition that makes blood clot more easily. RISK FACTORS Some people are more likely than others to develop blood clots. Risk factors include:   Smoking.  Being overweight (obese).  Sitting or lying still for a long time. This includes long-distance travel, paralysis, or recovery  from an illness or surgery. Other factors that increase risk are:   Older age, especially over 82 years of age.  Having a family history of blood clots or if you have already had a blot clot.  Having major or lengthy surgery. This is especially true for surgery on the hip, knee, or belly (abdomen). Hip surgery is particularly high risk.  Having a long, thin tube (catheter) placed inside a vein during a medical procedure.  Breaking a hip or leg.  Having cancer or cancer treatment.  Pregnancy and childbirth.  Hormone changes make the blood clot more easily during pregnancy.  The fetus puts pressure on the veins of the pelvis.  There is a risk of injury to veins during delivery or a caesarean delivery. The risk is highest just after childbirth.  Medicines containing the female hormone estrogen. This includes birth control pills and hormone replacement therapy.  Other circulation or heart problems.  SIGNS AND SYMPTOMS When a clot forms, it can either partially or totally block the blood flow in that vein. Symptoms of a DVT can include:  Swelling of the leg or arm, especially if one side is much worse.  Warmth and redness of the leg or arm, especially if one side is  much worse.  Pain in an arm or leg. If the clot is in the leg, symptoms may be more noticeable or worse when standing or walking. The symptoms of a DVT that has traveled to the lungs (pulmonary embolism, PE) usually start suddenly and include:  Shortness of breath.  Coughing.  Coughing up blood or blood-tinged mucus.  Chest pain. The chest pain is often worse with deep breaths.  Rapid heartbeat. Anyone with these symptoms should get emergency medical treatment right away. Do not wait to see if the symptoms will go away. Call your local emergency services (911 in the [place] if you have these symptoms. Do not drive yourself to the hospital. DIAGNOSIS If a DVT is suspected, your health care provider will take a full  medical history and perform a physical exam. Tests that also may be required include:  Blood tests, including studies of the clotting properties of the blood.  Ultrasound to see if you have clots in your legs or lungs.  X-rays to show the flow of blood when dye is injected into the veins (venogram).  Studies of your lungs if you have any chest symptoms. PREVENTION  Exercise the legs regularly. Take a brisk 30-minute walk every day.  Maintain a weight that is appropriate for your height.  Avoid sitting or lying in bed for long periods of time without moving your legs.  Women, particularly those over the age of 35 years, should consider the risks and benefits of taking estrogen medicines, including birth control pills.  Do not smoke, especially if you take estrogen medicines.  Long-distance travel can increase your risk of DVT. You should exercise your legs by walking or pumping the muscles every hour.  Many of the risk factors above relate to situations that exist with hospitalization, either for illness, injury, or elective surgery. Prevention may include medical and nonmedical measures.  Your health care provider will assess you for the need for venous thromboembolism prevention when you are admitted to the hospital. If you are having surgery, your surgeon will assess you the day of or day after surgery. TREATMENT Once identified, a DVT can be treated. It can also be prevented in some circumstances. Once you have had a DVT, you may be at increased risk for a DVT in the future. The most common treatment for DVT is blood-thinning (anticoagulant) medicine, which reduces the blood's tendency to clot. Anticoagulants can stop new blood clots from forming and stop old clots from growing. They cannot dissolve existing clots. Your body does this by itself over time. Anticoagulants can be given by mouth, through an IV tube, or by injection. Your health care provider will determine the best program  for you. Other medicines or treatments that may be used are:  Heparin or related medicines (low molecular weight heparin) are often the first treatment for a blood clot. They act quickly. However, they cannot be taken orally and must be given either in shot form or by IV tube.  Heparin can cause a fall in a component of blood that stops bleeding and forms blood clots (platelets). You will be monitored with blood tests to be sure this does not occur.  Warfarin is an anticoagulant that can be swallowed. It takes a few days to start working, so usually heparin or related medicines are used in combination. Once warfarin is working, heparin is usually stopped.  Factor Xa inhibitor medicines, such as rivaroxaban and apixaban, also reduce blood clotting. These medicines are taken orally and can  often be used without heparin or related medicines.  Less commonly, clot dissolving drugs (thrombolytics) are used to dissolve a DVT. They carry a high risk of bleeding, so they are used mainly in severe cases where your life or a part of your body is threatened.  Very rarely, a blood clot in the leg needs to be removed surgically.  If you are unable to take anticoagulants, your health care provider may arrange for you to have a filter placed in a main vein in your abdomen. This filter prevents clots from traveling to your lungs. HOME CARE INSTRUCTIONS  Take all medicines as directed by your health care provider.  Learn as much as you can about DVT.  Wear a medical alert bracelet or carry a medical alert card.  Ask your health care provider how soon you can go back to normal activities. It is important to stay active to prevent blood clots. If you are on anticoagulant medicine, avoid contact sports.  It is very important to exercise. This is especially important while traveling, sitting, or standing for long periods of time. Exercise your legs by walking or by tightening and relaxing your leg muscles  regularly. Take frequent walks.  You may need to wear compression stockings. These are tight elastic stockings that apply pressure to the lower legs. This pressure can help keep the blood in the legs from clotting. Taking Warfarin Warfarin is a daily medicine that is taken by mouth. Your health care provider will advise you on the length of treatment (usually 3-6 months, sometimes lifelong). If you take warfarin:  Understand how to take warfarin and foods that can affect how warfarin works in Public relations account executive.  Too much and too little warfarin are both dangerous. Too much warfarin increases the risk of bleeding. Too little warfarin continues to allow the risk for blood clots. Warfarin and Regular Blood Testing While taking warfarin, you will need to have regular blood tests to measure your blood clotting time. These blood tests usually include both the prothrombin time (PT) and international normalized ratio (INR) tests. The PT and INR results allow your health care provider to adjust your dose of warfarin. It is very important that you have your PT and INR tested as often as directed by your health care provider.  Warfarin and Your Diet Avoid major changes in your diet, or notify your health care provider before changing your diet. Arrange a visit with a registered dietitian to answer your questions. Many foods, especially foods high in vitamin K, can interfere with warfarin and affect the PT and INR results. You should eat a consistent amount of foods high in vitamin K. Foods high in vitamin K include:   Spinach, kale, broccoli, cabbage, collard and turnip greens, Brussels sprouts, peas, cauliflower, seaweed, and parsley.  Beef and pork liver.  Green tea.  Soybean oil. Warfarin with Other Medicines Many medicines can interfere with warfarin and affect the PT and INR results. You must:  Tell your health care provider about any and all medicines, vitamins, and supplements you take, including  aspirin and other over-the-counter anti-inflammatory medicines. Be especially cautious with aspirin and anti-inflammatory medicines. Ask your health care provider before taking these.  Do not take or discontinue any prescribed or over-the-counter medicine except on the advice of your health care provider or pharmacist. Warfarin Side Effects Warfarin can have side effects, such as easy bruising and difficulty stopping bleeding. Ask your health care provider or pharmacist about other side effects of warfarin. You  will need to:  Hold pressure over cuts for longer than usual.  Notify your dentist and other health care providers that you are taking warfarin before you undergo any procedures where bleeding may occur. Warfarin with Alcohol and Tobacco   Drinking alcohol frequently can increase the effect of warfarin, leading to excess bleeding. It is best to avoid alcoholic drinks or to consume only very small amounts while taking warfarin. Notify your health care provider if you change your alcohol intake.   Do not use any tobacco products including cigarettes, chewing tobacco, or electronic cigarettes. If you smoke, quit. Ask your health care provider for help with quitting smoking. Alternative Medicines to Warfarin: Factor Xa Inhibitor Medicines  These blood-thinning medicines are taken by mouth, usually for several weeks or longer. It is important to take the medicine every single day at the same time each day.  There are no regular blood tests required when using these medicines.  There are fewer food and drug interactions than with warfarin.  The side effects of this class of medicine are similar to those of warfarin, including excessive bruising or bleeding. Ask your health care provider or pharmacist about other potential side effects. SEEK MEDICAL CARE IF:  You notice a rapid heartbeat.  You feel weaker or more tired than usual.  You feel faint.  You notice increased  bruising.  You feel your symptoms are not getting better in the time expected.  You believe you are having side effects of medicine. SEEK IMMEDIATE MEDICAL CARE IF:  You have chest pain.  You have trouble breathing.  You have new or increased swelling or pain in one leg.  You cough up blood.  You notice blood in vomit, in a bowel movement, or in urine. MAKE SURE YOU:  Understand these instructions.  Will watch your condition.  Will get help right away if you are not doing well or get worse. Document Released: 10/09/2005 Document Revised: 02/23/2014 Document Reviewed: 06/16/2013 [facility] Patient Information 2015 [place], [place]. This information is not intended to replace advice given to you by your health care provider. Make sure you discuss any questions you have with your health care provider.     Pulmonary Embolism A pulmonary (lung) embolism (PE) is a blood clot that has traveled to the lung and results in a blockage of blood flow in the affected lung. Most clots come from deep veins in the legs or pelvis. PE is a dangerous and potentially life-threatening condition that can be treated if identified. CAUSES Blood clots form in a vein for different reasons. Usually several things cause blood clots. They include:  The flow of blood slows down.  The inside of the vein is damaged in some way.  The person has a condition that makes the blood clot more easily. RISK FACTORS Some people are more likely than others to develop PE. Risk factors include:   Smoking.  Being overweight (obese).  Sitting or lying still for a long time. This includes long-distance travel, paralysis, or recovery from an illness or surgery. Other factors that increase risk are:   Older age, especially over 30 years of age.  Having a family history of blood clots or if you have already had a blood clot.  Having major or lengthy surgery. This is especially true for surgery on the hip, knee, or  belly (abdomen). Hip surgery is particularly high risk.  Having a long, thin tube (catheter) placed inside a vein during a medical procedure.  Breaking a  hip or leg.  Having cancer or cancer treatment.  Medicines containing the female hormone estrogen. This includes birth control pills and hormone replacement therapy.  Other circulation or heart problems.  Pregnancy and childbirth.  Hormone changes make the blood clot more easily during pregnancy.  The fetus puts pressure on the veins of the pelvis.  There is a risk of injury to veins during delivery or a caesarean delivery. The risk is highest just after childbirth.  PREVENTION   Exercise the legs regularly. Take a brisk 30 minute walk every day.  Maintain a weight that is appropriate for your height.  Avoid sitting or lying in bed for long periods of time without moving your legs.  Women, particularly those over the age of 35 years, should consider the risks and benefits of taking estrogen medicines, including birth control pills.  Do not smoke, especially if you take estrogen medicines.  Long-distance travel can increase your risk. You should exercise your legs by walking or pumping the muscles every hour.  Many of the risk factors above relate to situations that exist with hospitalization, either for illness, injury, or elective surgery. Prevention may include medical and nonmedical measures.   Your health care provider will assess you for the need for venous thromboembolism prevention when you are admitted to the hospital. If you are having surgery, your surgeon will assess you the day of or day after surgery.  SYMPTOMS  The symptoms of a PE usually start suddenly and include:  Shortness of breath.  Coughing.  Coughing up blood or blood-tinged mucus.  Chest pain. Pain is often worse with deep breaths.  Rapid heartbeat. DIAGNOSIS  If a PE is suspected, your health care provider will take a medical history and  perform a physical exam. Other tests that may be required include:  Blood tests, such as studies of the clotting properties of your blood.  Imaging tests, such as ultrasound, CT, MRI, and other tests to see if you have clots in your legs or lungs.  An electrocardiogram. This can look for heart strain from blood clots in the lungs. TREATMENT   The most common treatment for a PE is blood thinning (anticoagulant) medicine, which reduces the blood's tendency to clot. Anticoagulants can stop new blood clots from forming and old clots from growing. They cannot dissolve existing clots. Your body does this by itself over time. Anticoagulants can be given by mouth, through an intravenous (IV) tube, or by injection. Your health care provider will determine the best program for you.  Less commonly, clot-dissolving medicines (thrombolytics) are used to dissolve a PE. They carry a high risk of bleeding, so they are used mainly in severe cases.  Very rarely, a blood clot in the leg needs to be removed surgically.  If you are unable to take anticoagulants, your health care provider may arrange for you to have a filter placed in a main vein in your abdomen. This filter prevents clots from traveling to your lungs. HOME CARE INSTRUCTIONS   Take all medicines as directed by your health care provider.  Learn as much as you can about DVT.  Wear a medical alert bracelet or carry a medical alert card.  Ask your health care provider how soon you can go back to normal activities. It is important to stay active to prevent blood clots. If you are on anticoagulant medicine, avoid contact sports.  It is very important to exercise. This is especially important while traveling, sitting, or standing for long  periods of time. Exercise your legs by walking or by tightening and relaxing your leg muscles regularly. Take frequent walks.  You may need to wear compression stockings. These are tight elastic stockings that apply  pressure to the lower legs. This pressure can help keep the blood in the legs from clotting. Taking Warfarin Warfarin is a daily medicine that is taken by mouth. Your health care provider will advise you on the length of treatment (usually 3-6 months, sometimes lifelong). If you take warfarin:  Understand how to take warfarin and foods that can affect how warfarin works in Public relations account executive.  Too much and too little warfarin are both dangerous. Too much warfarin increases the risk of bleeding. Too little warfarin continues to allow the risk for blood clots. Warfarin and Regular Blood Testing While taking warfarin, you will need to have regular blood tests to measure your blood clotting time. These blood tests usually include both the prothrombin time (PT) and international normalized ratio (INR) tests. The PT and INR results allow your health care provider to adjust your dose of warfarin. It is very important that you have your PT and INR tested as often as directed by your health care provider.  Warfarin and Your Diet Avoid major changes in your diet, or notify your health care provider before changing your diet. Arrange a visit with a registered dietitian to answer your questions. Many foods, especially foods high in vitamin K, can interfere with warfarin and affect the PT and INR results. You should eat a consistent amount of foods high in vitamin K. Foods high in vitamin K include:   Spinach, kale, broccoli, cabbage, collard and turnip greens, Brussels sprouts, peas, cauliflower, seaweed, and parsley.  Beef and pork liver.  Green tea.  Soybean oil. Warfarin with Other Medicines Many medicines can interfere with warfarin and affect the PT and INR results. You must:  Tell your health care provider about any and all medicines, vitamins, and supplements you take, including aspirin and other over-the-counter anti-inflammatory medicines. Be especially cautious with aspirin and anti-inflammatory  medicines. Ask your health care provider before taking these.  Do not take or discontinue any prescribed or over-the-counter medicine except on the advice of your health care provider or pharmacist. Warfarin Side Effects Warfarin can have side effects, such as easy bruising and difficulty stopping bleeding. Ask your health care provider or pharmacist about other side effects of warfarin. You will need to:  Hold pressure over cuts for longer than usual.  Notify your dentist and other health care providers that you are taking warfarin before you undergo any procedures where bleeding may occur. Warfarin with Alcohol and Tobacco   Drinking alcohol frequently can increase the effect of warfarin, leading to excess bleeding. It is best to avoid alcoholic drinks or consume only very small amounts while taking warfarin. Notify your health care provider if you change your alcohol intake.  Do not use any tobacco products including cigarettes, chewing tobacco, or electronic cigarettes. If you smoke, quit. Ask your health care provider for help with quitting smoking. Alternative Medicines to Warfarin: Factor Xa Inhibitor Medicines  These blood thinning medicines are taken by mouth, usually for several weeks or longer. It is important to take the medicine every single day, at the same time each day.  There are no regular blood tests required when using these medicines.  There are fewer food and drug interactions than with warfarin.  The side effects of this class of medicine is similar to that  of warfarin, including excessive bruising or bleeding. Ask your health care provider or pharmacist about other potential side effects. SEEK MEDICAL CARE IF:   You notice a rapid heartbeat.  You feel weaker or more tired than usual.  You feel faint.  You notice increased bruising.  Your symptoms are not getting better in the time expected.  You are having side effects of medicine. SEEK IMMEDIATE MEDICAL  CARE IF:   You have chest pain.  You have trouble breathing.  You have new or increased swelling or pain in one leg.  You cough up blood.  You notice blood in vomit, in a bowel movement, or in urine.  You have a fever. Symptoms of PE may represent a serious problem that is an emergency. Do not wait to see if the symptoms will go away. Get medical help right away. Call your local emergency services (911 in the [place]). Do not drive yourself to the hospital. Document Released: 10/06/2000 Document Revised: 02/23/2014 Document Reviewed: 10/20/2013 [facility] Patient Information 2015 [place], [place]. This information is not intended to replace advice given to you by your health care provider. Make sure you discuss any questions you have with your health care provider.

## 2014-07-28 NOTE — Consult Note (Signed)
ANTICOAGULATION CONSULT NOTE  Pharmacy Consult for Heparin Indication: pulmonary embolus and DVT  Allergies  Allergen Reactions  . Risperdal [Risperidone] Other (See Comments)    sedation    Patient Measurements: Height: 5' 4.17" (163 cm) Weight: 138 lb (62.596 kg) IBW/kg (Calculated) : 55.1 Heparin Dosing Weight: 62.5kg  Vital Signs: Temp: 98.6 F (37 C) (10/06 0033) Temp Source: Oral (10/06 0033) BP: 157/84 mmHg (10/05 2227) Pulse Rate: 117 (10/05 2227)  Labs:  Recent Labs  07/27/14 1337 07/28/14 0017  HGB 12.2 10.5*  HCT 37.7 32.9*  PLT 285 272  LABPROT 15.3* 15.0  INR 1.21 1.18  HEPARINUNFRC  --  0.53  CREATININE 0.62  --     Estimated Creatinine Clearance: 52 ml/min (by C-G formula based on Cr of 0.62).   Assessment: 78 yo female with DVT/PE for heparin  Goal of Therapy:  Heparin level 0.3-0.7 units/ml Monitor platelets by anticoagulation protocol: Yes   Plan:  Continue Heparin at current rate  Recheck level in 6 hrs to confirm  Geannie RisenGreg Garyson Stelly, PharmD, BCPS

## 2014-07-28 NOTE — Consult Note (Signed)
ANTICOAGULATION CONSULT NOTE - Initial Consult  Pharmacy Consult for Heparin Indication: pulmonary embolus and DVT  Allergies  Allergen Reactions  . Risperdal [Risperidone] Other (See Comments)    sedation    Patient Measurements: Height: 5' 4.17" (163 cm) Weight: 138 lb (62.596 kg) IBW/kg (Calculated) : 55.1 Heparin Dosing Weight: 62.5kg  Vital Signs: Temp: 98.5 F (36.9 C) (10/06 0703) Temp Source: Oral (10/06 0703) BP: 130/67 mmHg (10/06 0703) Pulse Rate: 93 (10/06 0703)  Labs:  Recent Labs  07/27/14 1337 07/28/14 0017 07/28/14 0730  HGB 12.2 10.5*  --   HCT 37.7 32.9*  --   PLT 285 272  --   LABPROT 15.3* 15.0  --   INR 1.21 1.18  --   HEPARINUNFRC  --  0.53 0.87*  CREATININE 0.62  --   --     Estimated Creatinine Clearance: 52 ml/min (by C-G formula based on Cr of 0.62).  Assessment: 78 yof on IV heparin for LLE DVT and bilateral PEs with right heart strain. Heparin level (0.87) supratherapeutic on 1100 units/hr, drawn from opposite hand, no bleeding reported. Hgb slightly down, plt wnl. INR 1.18 low as expected after one dose of coumadin 5mg  last night.  Goal of Therapy:  INR 2-3 Heparin level 0.3-0.7 units/ml Monitor platelets by anticoagulation protocol: Yes   Plan:  - Decrease Heparin drip rate to 1000 units/hr - 8 hour heparin level at 1800 - Coumadin 5mg  po x 1 - Daily heparin level, PT/INR and CBC  Sabrina Long, PharmD, BCPS  Clinical Pharmacist  Pager: [phone]   07/28/2014,9:20 AM

## 2014-07-28 NOTE — Progress Notes (Signed)
[facility] PROGRESS NOTE  Sabrina Long ZOX:[id number] DOB: [birth year] DOA: 07/27/2014 PCP: Sabrina Spikes, DO  Assessment/Plan:    Bilateral pulmonary embolism - Patient on heparin with bridging to Coumadin - Pharmacy managing Coumadin - Monitor INR  Active Problems:   Esophageal reflux -Stable continue PPI    Alzheimer's disease -Stable continue home regimen   Code Status: full Family Communication: None at bedside Disposition Plan: Once INR therapeutic at may consider transitioning out of the hospital or been placed on subcutaneous Lovenox.   Consultants:  None  Procedures:  None  Antibiotics:  none  HPI/Subjective: No acute issues reported to me overnight. Patient does not have meaningful interaction with with examiner.  Objective: Filed Vitals:   07/28/14 1530  BP: 141/83  Pulse: 83  Temp:   Resp:     Intake/Output Summary (Last 24 hours) at 07/28/14 1646 Last data filed at 07/28/14 1245  Gross per 24 hour  Intake 1344.5 ml  Output      0 ml  Net 1344.5 ml   Filed Weights   07/27/14 1500 07/27/14 1543  Weight: 65.8 kg (145 lb 1 oz) 62.596 kg (138 lb)    Exam:   General:  Pt in nad, alert and awake  Cardiovascular: rrr, no mrg  Respiratory: no increased wob, no wheezes, breath sounds BL  Abdomen: soft, NT, ND  Musculoskeletal: no cyanosis or clubbing   Data Reviewed: Basic Metabolic Panel:  Recent Labs Lab 07/27/14 1337  NA 144  K 3.9  CL 107  CO2 23  GLUCOSE 123*  BUN 11  CREATININE 0.62  CALCIUM 9.7   Liver Function Tests: No results found for this basename: AST, ALT, ALKPHOS, BILITOT, PROT, ALBUMIN,  in the last 168 hours No results found for this basename: LIPASE, AMYLASE,  in the last 168 hours No results found for this basename: AMMONIA,  in the last 168 hours CBC:  Recent Labs Lab 07/27/14 1337 07/28/14 0017  WBC 8.7 7.6  NEUTROABS 5.6  --   HGB 12.2 10.5*  HCT 37.7 32.9*  MCV 92.9 92.7  PLT  285 272   Cardiac Enzymes: No results found for this basename: CKTOTAL, CKMB, CKMBINDEX, TROPONINI,  in the last 168 hours BNP (last 3 results) No results found for this basename: PROBNP,  in the last 8760 hours CBG: No results found for this basename: GLUCAP,  in the last 168 hours  Recent Results (from the past 240 hour(s))  MRSA PCR SCREENING     Status: None   Collection Time    07/28/14  9:24 AM      Result Value Ref Range Status   MRSA by PCR NEGATIVE  NEGATIVE Final   Comment:            The GeneXpert MRSA Assay (FDA     approved for NASAL specimens     only), is one component of a     comprehensive MRSA colonization     surveillance program. It is not     intended to diagnose MRSA     infection nor to guide or     monitor treatment for     MRSA infections.     Studies: Ct Angio Chest W/cm &/or Wo Cm  07/27/2014   CLINICAL DATA:  History of deep venous thrombosis. The patient is unresponsive.  EXAM: CT ANGIOGRAPHY CHEST WITH CONTRAST  TECHNIQUE: Multidetector CT imaging of the chest was performed using the standard protocol during bolus administration of intravenous contrast.  Multiplanar CT image reconstructions and MIPs were obtained to evaluate the vascular anatomy.  CONTRAST:  80 mL OMNIPAQUE IOHEXOL 350 MG/ML SOLN  COMPARISON:  PA and lateral chest 05/03/2013 appear  FINDINGS: The study is positive for bilateral pulmonary emboli. Saddle embolus is seen at the bifurcation of the right main pulmonary artery with clot also identified and anterior left upper lobe and descending left intralobar branches. The right ventricle to left ventricle ratio is 1.5 consistent with right heart strain. No pleural or pericardial effusion. Heart size is normal. No axillary, hilar or mediastinal lymphadenopathy. No calcific coronary atherosclerosis is identified. The lungs demonstrate only mild dependent atelectasis. Incidentally imaged upper abdomen shows calcification in the posterior right  hepatic lobe. No lytic or sclerotic bony lesion is identified.  Review of the MIP images confirms the above findings.  IMPRESSION: Study is positive for pulmonary embolus with evidence of right heart strain.  Critical Value/emergent results were called by telephone at the time of interpretation on 07/27/2014 at 3:19 pm to [facility], PA, who verbally acknowledged these results.   Electronically Signed   By: Sabrina Kannerhomas  Long M.D.   On: 07/27/2014 15:43    Scheduled Meds: . ALPRAZolam  0.25-0.5 mg Oral BID  . diclofenac sodium  1 application Topical BID  . hydrALAZINE  50 mg Oral TID  . losartan  25 mg Oral Daily  . memantine  10 mg Oral BID  . metoprolol tartrate  25 mg Oral BID  . multivitamin with minerals  1 tablet Oral Daily  . pantoprazole  40 mg Oral Daily  . [START ON 07/29/2014] pneumococcal 23 valent vaccine  0.5 mL Intramuscular Tomorrow-1000  . rosuvastatin  10 mg Oral Daily  . sertraline  100 mg Oral QHS  . warfarin  5 mg Oral ONCE-1800  . warfarin   Does not apply Once  . Warfarin - Pharmacist Dosing Inpatient   Does not apply q1800   Continuous Infusions: . heparin 1,000 Units/hr (07/28/14 1204)     Time spent: > 35 minutes    Sabrina Long, Sabrina Long  [facility] Pager [phone]  If 7PM-7AM, please contact night-coverage at [email or web address], password [email or web address] 07/28/2014, 4:46 PM  LOS: 1 day

## 2014-07-28 NOTE — Progress Notes (Signed)
ANTICOAGULATION CONSULT NOTE:  Pharmacy consult for heparin Indication: pulmonary embolus and DVT  Heparin level: 0.72 on 1000 units/hr Goal heparin level: 0.3-0.7  Heparin level was just slightly higher than desired level. Will slow rate just slightly to 950 units/hr and recheck level in the AM.  Sabrina Long, PharmD

## 2014-07-28 NOTE — Progress Notes (Signed)
Echo Lab  2D Echocardiogram completed.  Lindy Pennisi L Izreal Kock, RDCS 07/28/2014 12:56 PM

## 2014-07-28 NOTE — Care Management Note (Signed)
    Page 1 of 2   08/02/2014     10:36:11 AM CARE MANAGEMENT NOTE 08/02/2014  Patient:  Sabrina Long,Sabrina Long   Account Number:  [id number]  Date Initiated:  07/28/2014  Documentation initiated by:  AMERSON,JULIE  Subjective/Objective Assessment:   Pt adm on 07/27/14 with bilateral PE and LLE DVT.  PTA, pt resides at home with family.  She has hx of dementia, per medical record.     Action/Plan:   CM consult to check coverage for Xarelto.  Will follow for additional home needs.   Anticipated DC Date:  07/30/2014   Anticipated DC Plan:  HOME W HOME HEALTH SERVICES      DC Planning Services  CM consult      [facility]   Choice offered to / List presented to:  C-4 Adult Children        HH arranged  HH-1 RN  HH-2 PT      [facility] agency  [facility]   Status of service:  Completed, signed off Medicare Important Message given?  YES (If response is "NO", the following Medicare IM given date fields will be blank) Date Medicare IM given:  07/30/2014 Medicare IM given by:  AMERSON,JULIE Date Additional Medicare IM given:   Additional Medicare IM given by:    Discharge Disposition:  HOME W HOME HEALTH SERVICES  Per UR Regulation:  Reviewed for med. necessity/level of care/duration of stay  If discussed at Long Length of Stay Meetings, dates discussed:    Comments:  08/02/14 LATE ENTRY: 08/01/14 15:45 CM received call from RN stating pt is ready to go and Son, [name] is groing impatient; CM requests RN put [name] on the phone.  [name] states they "always use [facility]" [facility]  [name] and spoke with Alvino ChapelEllen who requests I fax facesheet, orders, F2F, H&Long, and [facility] summary [facility] [phone].  Cm faxed the aforementioned.  No other CM needs were communicate.  Freddy JakschSarah Marya Lowden, BSN, [place] [phone].  07/31/14 Sidney AceJulie Amerson, RN, BSN [phone] Pt to dc on home Lovenox; [facility] and HHPT.  Will await orders from MD for homecare to  set up with family.  Lovenox coverage as follows:  per rep at [employer]:  covered, $8.00 for 30 day supply at retail// $0 for 90 day mail order  patient can use: [facility], [facility], [facility],  [facility]   07/28/14 Sidney AceJulie Amerson, RN, BSN [phone]  per rep at [employer] tr[employer]-  15mg - $20 at retail - no auth required  20mg  - $20 at retail - no auth required  patient can use: [facility], [facility], [employer], [employer]

## 2014-07-28 NOTE — Progress Notes (Signed)
Unable to collect UA. Patient's family refused the In and out cath and patient is incontinent. Sabrina Matteina Cambryn Charters, RN

## 2014-07-29 DIAGNOSIS — I82402 Acute embolism and thrombosis of unspecified deep veins of left lower extremity: Secondary | ICD-10-CM

## 2014-07-29 DIAGNOSIS — I10 Essential (primary) hypertension: Secondary | ICD-10-CM | POA: Diagnosis present

## 2014-07-29 LAB — CBC
HEMATOCRIT: 31.3 % — AB (ref 36.0–46.0)
Hemoglobin: 10.2 g/dL — ABNORMAL LOW (ref 12.0–15.0)
MCH: 30 pg (ref 26.0–34.0)
MCHC: 32.6 g/dL (ref 30.0–36.0)
MCV: 92.1 fL (ref 78.0–100.0)
PLATELETS: 278 10*3/uL (ref 150–400)
RBC: 3.4 MIL/uL — ABNORMAL LOW (ref 3.87–5.11)
RDW: 14.7 % (ref 11.5–15.5)
WBC: 6.5 10*3/uL (ref 4.0–10.5)

## 2014-07-29 LAB — HEPARIN LEVEL (UNFRACTIONATED): HEPARIN UNFRACTIONATED: 0.52 [IU]/mL (ref 0.30–0.70)

## 2014-07-29 LAB — PROTIME-INR
INR: 1.26 (ref 0.00–1.49)
Prothrombin Time: 15.8 seconds — ABNORMAL HIGH (ref 11.6–15.2)

## 2014-07-29 MED ORDER — ACETAMINOPHEN 325 MG PO TABS
650.0000 mg | ORAL_TABLET | Freq: Four times a day (QID) | ORAL | Status: DC | PRN
  Administered 2014-07-31 (×2): 650 mg via ORAL
  Filled 2014-07-29 (×2): qty 2

## 2014-07-29 MED ORDER — WARFARIN SODIUM 5 MG PO TABS
5.0000 mg | ORAL_TABLET | Freq: Once | ORAL | Status: AC
  Administered 2014-07-29: 5 mg via ORAL
  Filled 2014-07-29: qty 1

## 2014-07-29 NOTE — Consult Note (Signed)
Chief Complaint: Chief Complaint  Patient presents with  . DVT  Bilateral pulmonary embolism.  Referring Physician(s): Dr. Waymon AmatoHongalgi  History of Present Illness: Sabrina Long is a 78 y.o. female with bilateral pulmonary embolism on 07/27/14.  CTA showed moderate PE and possible right heart strain.  Echo was negative for right heart strain and showed mild RV dilatation without elevation of estimated RV/PA pressures.  LE ultrasound was limited but showed LLE DVT.  Patient with history of severe dementia.  Past Medical History  Diagnosis Date  . Unspecified constipation   . Anxiety state, unspecified   . Colitis, enteritis, and gastroenteritis of presumed infectious origin 11/02/2011  . Pneumonia, organism unspecified 11/02/2011  . Cystitis, unspecified   . Impetigo   . Renal artery stenosis   . Dementia in conditions classified elsewhere with behavioral disturbance   . Hypercalcemia   . Reflux esophagitis   . Chest pain, unspecified   . Abnormality of gait   . Hyperosmolality and/or hypernatremia   . Unspecified disorder of liver   . Candidiasis of vulva and vagina   . Alzheimer's disease   . Chest pain, unspecified 08/19/2008  . Dementia in conditions classified elsewhere without behavioral disturbance   . Depressive disorder, not elsewhere classified   . Esophageal reflux   . Other and unspecified hyperlipidemia   . Loss of weight   . Unspecified persistent mental disorders due to conditions classified elsewhere   . Reactive confusion   . Unspecified essential hypertension   . Other seborrheic keratosis   . Osteoarthrosis, unspecified whether generalized or localized, unspecified site   . Stiffness of joint, not elsewhere classified, unspecified site   . Memory loss 07/24/2007  . Headache(784.0)   . Undiagnosed cardiac murmurs     Past Surgical History  Procedure Laterality Date  . Cesarean section      Allergies: Risperdal  Medications: Prior to  Admission medications   Medication Sig Start Date End Date Taking? Authorizing Provider  alendronate (FOSAMAX) 70 MG tablet Take 70 mg by mouth once a week. Take with a full glass of water on an empty stomach.  On Tuesdays   Yes Historical Provider, MD  ALPRAZolam Prudy Feeler(XANAX) 0.5 MG tablet Take 0.25-0.5 mg by mouth 2 (two) times daily. Take 1/2 tablet (0.25 mg) every morning and 1 tablet (0.5 mg) every night   Yes Historical Provider, MD  diclofenac sodium (VOLTAREN) 1 % GEL Apply 1 application topically 2 (two) times daily. Apply to knees and hips   Yes Historical Provider, MD  hydrALAZINE (APRESOLINE) 50 MG tablet Take 1 tablet (50 mg total) by mouth 3 (three) times daily. 01/12/14  Yes [name] L Reed, DO  losartan (COZAAR) 25 MG tablet Take 25 mg by mouth daily.   Yes Historical Provider, MD  memantine (NAMENDA) 10 MG tablet Take 10 mg by mouth 2 (two) times daily.   Yes Historical Provider, MD  metoprolol tartrate (LOPRESSOR) 25 MG tablet Take 25 mg by mouth 2 (two) times daily.    Yes Historical Provider, MD  Multiple Vitamin (MULTIVITAMIN WITH MINERALS) TABS tablet Take 1 tablet by mouth daily. Centrum Silver   Yes Historical Provider, MD  nitroGLYCERIN (NITROSTAT) 0.4 MG SL tablet Place 0.4 mg under the tongue every 5 (five) minutes as needed for chest pain. Take one tablet sublingual every 5 minutes not to exceed three tablets for chest pain   Yes Historical Provider, MD  omeprazole (PRILOSEC) 20 MG capsule Take 20 mg by mouth daily.  Yes Historical Provider, MD  rosuvastatin (CRESTOR) 10 MG tablet Take 10 mg by mouth daily.   Yes Historical Provider, MD  sertraline (ZOLOFT) 100 MG tablet Take 100 mg by mouth at bedtime.    Yes Historical Provider, MD    No family history on file.  History   Social History  . Marital Status: Married    Spouse Name: N/A    Number of Children: N/A  . Years of Education: N/A   Social History Main Topics  . Smoking status: Never Smoker   . Smokeless  tobacco: Never Used  . Alcohol Use: No  . Drug Use: No  . Sexual Activity: None   Other Topics Concern  . None   Social History Narrative  . None    Review of Systems  Vital Signs: BP 138/75  Pulse 80  Temp(Src) 98.3 F (36.8 C) (Axillary)  Resp 18  Ht 5' 4.17" (1.63 m)  Wt 138 lb (62.596 kg)  BMI 23.56 kg/m2  SpO2 97%  Physical Exam  Imaging: Ct Angio Chest W/cm &/or Wo Cm  07/27/2014   CLINICAL DATA:  History of deep venous thrombosis. The patient is unresponsive.  EXAM: CT ANGIOGRAPHY CHEST WITH CONTRAST  TECHNIQUE: Multidetector CT imaging of the chest was performed using the standard protocol during bolus administration of intravenous contrast. Multiplanar CT image reconstructions and MIPs were obtained to evaluate the vascular anatomy.  CONTRAST:  80 mL OMNIPAQUE IOHEXOL 350 MG/ML SOLN  COMPARISON:  PA and lateral chest 05/03/2013 appear  FINDINGS: The study is positive for bilateral pulmonary emboli. Saddle embolus is seen at the bifurcation of the right main pulmonary artery with clot also identified and anterior left upper lobe and descending left intralobar branches. The right ventricle to left ventricle ratio is 1.5 consistent with right heart strain. No pleural or pericardial effusion. Heart size is normal. No axillary, hilar or mediastinal lymphadenopathy. No calcific coronary atherosclerosis is identified. The lungs demonstrate only mild dependent atelectasis. Incidentally imaged upper abdomen shows calcification in the posterior right hepatic lobe. No lytic or sclerotic bony lesion is identified.  Review of the MIP images confirms the above findings.  IMPRESSION: Study is positive for pulmonary embolus with evidence of right heart strain.  Critical Value/emergent results were called by telephone at the time of interpretation on 07/27/2014 at 3:19 pm to [facility], PA, who verbally acknowledged these results.   Electronically Signed   By: Drusilla Kanner M.D.   On:  07/27/2014 15:43    Labs:  CBC:  Recent Labs  02/20/14 0745 07/27/14 1337 07/28/14 0017 07/29/14 0319  WBC 6.8 8.7 7.6 6.5  HGB 12.0 12.2 10.5* 10.2*  HCT 37.8 37.7 32.9* 31.3*  PLT 156 285 272 278    COAGS:  Recent Labs  07/27/14 1337 07/28/14 0017 07/29/14 0319  INR 1.21 1.18 1.26    BMP:  Recent Labs  02/19/14 0656 02/19/14 1415 02/20/14 0745 07/27/14 1337  NA 152* 153* 145 144  K 3.4* 3.5* 3.3* 3.9  CL 115* 115* 109 107  CO2 24 22 23 23   GLUCOSE 133* 138* 145* 123*  BUN 10 10 7 11   CALCIUM 9.7 9.4 9.2 9.7  CREATININE 0.90 0.89 0.81 0.62  GFRNONAA 61* 62* 69* 85*  GFRAA 71* 72* 80* >90    LIVER FUNCTION TESTS:  Recent Labs  01/22/14 1301  BILITOT <0.2  AST 15  ALT 12  ALKPHOS 71  PROT 7.6    TUMOR MARKERS: No results found  for this basename: AFPTM, CEA, CA199, CHROMGRNA,  in the last 8760 hours  Assessment and Plan:  I reviewed imaging findings.  There is moderate thrombus in the right distal PA and segmental branches and mild thrombus in left PA.  Estimation of right heart strain by CT measurements is likely inaccurate based on LVH, creating small LV cavity by CT.  Echo did not reveal evidence of right heart strain.  Given imaging findings and severe underlying dementia, the patient is not a candidate for catheter directed PE lysis.  Clot burden overall is not that severe.  Could consider IVC filter placement to prevent further PE given [place] evidence of LLE DVT.   SignedIrish Lack T 07/29/2014, 2:38 PM

## 2014-07-29 NOTE — Progress Notes (Signed)
PROGRESS NOTE    Sabrina Long WUJ:[id number] DOB: 1936-02-07 DOA: 07/27/2014 PCP: Bufford Spikes, DO  HPI/Brief narrative 78 year old female with history of advanced dementia, HTN, lives with son, presented to PCP with left lower extremity swelling which confirmed to extensive LLE DVT and CTA chest showed bilateral PE. CT chest was initially read as saddle PE not-however upon discussion with radiology on 10/7 indicates moderate PE without right heart strain. Patient currently on IV heparin bridging and PO Coumadin. Discussed with family regarding choice of continued Coumadin versus NOAC +/- IVC filter.   Assessment/Plan:  1. Left lower extremity DVT & bilateral PE: Admitted to telemetry. Has remained hemodynamically stable. No reported chest pain or dyspnea. Not hypoxic. Patient was started on IV heparin bridging and Coumadin. Discussed with radiology who reviewed CT and indicate that she has moderate PE without right heart strain and is not a candidate for TPA lysis. Discussed with family/Ms. Trellis Moment regarding choice of anticoagulation-Coumadin versus NOAC +/-IVC filter as suggested by radiology to prevent further PE. 2. Advanced dementia: Mental status probably at baseline 3. Hypertension: Reasonable inpatient control. Continue losartan and metoprolol 4. Anemia: Stable. Follow CBCs   Code Status: Full Family Communication: Discussed with patient's daughters- Ms Zahraa Bhargava & Trellis Moment. Disposition Plan: DC home when medically stable.   Consultants:  Radiology  Procedures:  None  Antibiotics:  None   Subjective: Pleasantly confused. Smiles inappropriately but nonverbal. As per nursing, no acute events.  Objective: Filed Vitals:   07/28/14 2100 07/28/14 2134 07/29/14 0500 07/29/14 1339  BP: 139/117 138/88 153/82 138/75  Pulse: 91  83 80  Temp: 98.6 F (37 C)  97.2 F (36.2 C) 98.3 F (36.8 C)  TempSrc: Axillary  Axillary Axillary  Resp: 20   18    Height:      Weight:      SpO2: 96%  98% 97%    Intake/Output Summary (Last 24 hours) at 07/29/14 1731 Last data filed at 07/29/14 1239  Gross per 24 hour  Intake    556 ml  Output      0 ml  Net    556 ml   Filed Weights   07/27/14 1500 07/27/14 1543  Weight: 65.8 kg (145 lb 1 oz) 62.596 kg (138 lb)     Exam:  General exam: Pleasant elderly female lying comfortably in bed. Respiratory system: Clear. No increased work of breathing. Cardiovascular system: S1 & S2 heard, RRR. No JVD, murmurs, gallops, clicks or pedal edema. Telemetry: Sinus rhythm. Gastrointestinal system: Abdomen is nondistended, soft and nontender. Normal bowel sounds heard. Central nervous system: Alert but not oriented. No focal neurological deficits. Extremities: Symmetric 5 x 5 power. Diffusely swollen left lower extremity without any other acute findings. Symmetrical peripheral pulses well felt.   Data Reviewed: Basic Metabolic Panel:  Recent Labs Lab 07/27/14 1337  NA 144  K 3.9  CL 107  CO2 23  GLUCOSE 123*  BUN 11  CREATININE 0.62  CALCIUM 9.7   Liver Function Tests: No results found for this basename: AST, ALT, ALKPHOS, BILITOT, PROT, ALBUMIN,  in the last 168 hours No results found for this basename: LIPASE, AMYLASE,  in the last 168 hours No results found for this basename: AMMONIA,  in the last 168 hours CBC:  Recent Labs Lab 07/27/14 1337 07/28/14 0017 07/29/14 0319  WBC 8.7 7.6 6.5  NEUTROABS 5.6  --   --   HGB 12.2 10.5* 10.2*  HCT 37.7 32.9* 31.3*  MCV 92.9 92.7 92.1  PLT 285 272 278   Cardiac Enzymes: No results found for this basename: CKTOTAL, CKMB, CKMBINDEX, TROPONINI,  in the last 168 hours BNP (last 3 results) No results found for this basename: PROBNP,  in the last 8760 hours CBG: No results found for this basename: GLUCAP,  in the last 168 hours  Recent Results (from the past 240 hour(s))  MRSA PCR SCREENING     Status: None   Collection Time     07/28/14  9:24 AM      Result Value Ref Range Status   MRSA by PCR NEGATIVE  NEGATIVE Final   Comment:            The GeneXpert MRSA Assay (FDA     approved for NASAL specimens     only), is one component of a     comprehensive MRSA colonization     surveillance program. It is not     intended to diagnose MRSA     infection nor to guide or     monitor treatment for     MRSA infections.         Studies: No results found.      Scheduled Meds: . ALPRAZolam  0.25-0.5 mg Oral BID  . diclofenac sodium  1 application Topical BID  . hydrALAZINE  50 mg Oral TID  . losartan  25 mg Oral Daily  . memantine  10 mg Oral BID  . metoprolol tartrate  25 mg Oral BID  . multivitamin with minerals  1 tablet Oral Daily  . pantoprazole  40 mg Oral Daily  . pneumococcal 23 valent vaccine  0.5 mL Intramuscular Tomorrow-1000  . rosuvastatin  10 mg Oral Daily  . sertraline  100 mg Oral QHS  . warfarin   Does not apply Once  . Warfarin - Pharmacist Dosing Inpatient   Does not apply q1800   Continuous Infusions: . heparin 950 Units/hr (07/28/14 2110)    Active Problems:   Esophageal reflux   Alzheimer's disease   Bilateral pulmonary embolism    Time spent: 40 minutes    Montell Leopard, MD, FACP, FHM. [facility] Pager [phone]  If 7PM-7AM, please contact night-coverage [email or web address] Password [email or web address] 07/29/2014, 5:31 PM    LOS: 2 days

## 2014-07-29 NOTE — Consult Note (Signed)
ANTICOAGULATION CONSULT NOTE - Initial Consult  Pharmacy Consult for Heparin Indication: pulmonary embolus and DVT  Allergies  Allergen Reactions  . Risperdal [Risperidone] Other (See Comments)    sedation    Patient Measurements: Height: 5' 4.17" (163 cm) Weight: 138 lb (62.596 kg) IBW/kg (Calculated) : 55.1 Heparin Dosing Weight: 62.5kg  Vital Signs: Temp: 97.2 F (36.2 C) (10/07 0500) Temp Source: Axillary (10/07 0500) BP: 153/82 mmHg (10/07 0500) Pulse Rate: 83 (10/07 0500)  Labs:  Recent Labs  07/27/14 1337  07/28/14 0017 07/28/14 0730 07/28/14 1815 07/29/14 0319  HGB 12.2  --  10.5*  --   --  10.2*  HCT 37.7  --  32.9*  --   --  31.3*  PLT 285  --  272  --   --  278  LABPROT 15.3*  --  15.0  --   --  15.8*  INR 1.21  --  1.18  --   --  1.26  HEPARINUNFRC  --   < > 0.53 0.87* 0.72* 0.52  CREATININE 0.62  --   --   --   --   --   < > = values in this interval not displayed.  Estimated Creatinine Clearance: 52 ml/min (by C-G formula based on Cr of 0.62).  Assessment: 78 yof on IV heparin for LLE DVT and bilateral PEs with right heart strain. Heparin level (0.52) therapeutic on 950 units/hr, INR 1.26, trending up slightly. hgb 10.2, plt wnl. No bleeding noted per chart.  Goal of Therapy:  INR 2-3 Heparin level 0.3-0.7 units/ml Monitor platelets by anticoagulation protocol: Yes   Plan:  - Continue Heparin drip 950 units/hr - Coumadin 5mg  po x 1 - Daily heparin level, PT/INR and CBC  Bayard HuggerMei Sharie Amorin, PharmD, BCPS  Clinical Pharmacist  Pager: [phone]   07/29/2014,10:47 AM

## 2014-07-30 ENCOUNTER — Ambulatory Visit: Payer: Medicare Other | Admitting: Podiatrist

## 2014-07-30 LAB — GLUCOSE, CAPILLARY: Glucose-Capillary: 113 mg/dL — ABNORMAL HIGH (ref 70–99)

## 2014-07-30 LAB — CBC
HEMATOCRIT: 31.5 % — AB (ref 36.0–46.0)
Hemoglobin: 10.2 g/dL — ABNORMAL LOW (ref 12.0–15.0)
MCH: 29.9 pg (ref 26.0–34.0)
MCHC: 32.4 g/dL (ref 30.0–36.0)
MCV: 92.4 fL (ref 78.0–100.0)
Platelets: 296 10*3/uL (ref 150–400)
RBC: 3.41 MIL/uL — ABNORMAL LOW (ref 3.87–5.11)
RDW: 14.5 % (ref 11.5–15.5)
WBC: 6.6 10*3/uL (ref 4.0–10.5)

## 2014-07-30 LAB — HEPARIN LEVEL (UNFRACTIONATED): HEPARIN UNFRACTIONATED: 0.52 [IU]/mL (ref 0.30–0.70)

## 2014-07-30 LAB — PROTIME-INR
INR: 1.23 (ref 0.00–1.49)
Prothrombin Time: 15.5 seconds — ABNORMAL HIGH (ref 11.6–15.2)

## 2014-07-30 MED ORDER — WARFARIN SODIUM 7.5 MG PO TABS
7.5000 mg | ORAL_TABLET | Freq: Once | ORAL | Status: AC
  Administered 2014-07-30: 7.5 mg via ORAL
  Filled 2014-07-30: qty 1

## 2014-07-30 MED ORDER — PATIENT'S GUIDE TO USING COUMADIN BOOK
Freq: Once | Status: AC
  Administered 2014-07-30: 14:00:00
  Filled 2014-07-30: qty 1

## 2014-07-30 MED ORDER — ENOXAPARIN SODIUM 80 MG/0.8ML ~~LOC~~ SOLN
1.0000 mg/kg | Freq: Two times a day (BID) | SUBCUTANEOUS | Status: DC
  Administered 2014-07-30 – 2014-07-31 (×2): 65 mg via SUBCUTANEOUS
  Filled 2014-07-30 (×5): qty 0.8

## 2014-07-30 NOTE — Consult Note (Addendum)
ANTICOAGULATION CONSULT NOTE - Initial Consult  Pharmacy Consult for Heparin/coumadin Indication: pulmonary embolus and DVT  Allergies  Allergen Reactions  . Risperdal [Risperidone] Other (See Comments)    sedation    Patient Measurements: Height: 5' 4.17" (163 cm) Weight: 138 lb (62.596 kg) IBW/kg (Calculated) : 55.1 Heparin Dosing Weight: 62.5kg  Vital Signs: Temp: 98.5 F (36.9 C) (10/08 0500) Temp Source: Axillary (10/08 0500) BP: 152/78 mmHg (10/08 1034) Pulse Rate: 94 (10/08 0500)  Labs:  Recent Labs  07/27/14 1337 07/28/14 0017  07/28/14 1815 07/29/14 0319 07/30/14 0433  HGB 12.2 10.5*  --   --  10.2* 10.2*  HCT 37.7 32.9*  --   --  31.3* 31.5*  PLT 285 272  --   --  278 296  LABPROT 15.3* 15.0  --   --  15.8* 15.5*  INR 1.21 1.18  --   --  1.26 1.23  HEPARINUNFRC  --  0.53  < > 0.72* 0.52 0.52  CREATININE 0.62  --   --   --   --   --   < > = values in this interval not displayed.  Estimated Creatinine Clearance: 52 ml/min (by C-G formula based on Cr of 0.62).  Assessment: 78 yof on IV heparin for LLE DVT and bilateral PEs with right heart strain. Heparin level (0.52) therapeutic on 950 units/hr, INR 1.23, trending up very slowly on 5mg . hgb 10.2, plt wnl. No bleeding noted per chart.  Goal of Therapy:  INR 2-3 Heparin level 0.3-0.7 units/ml Monitor platelets by anticoagulation protocol: Yes   Plan:  - Continue Heparin drip 950 units/hr - Coumadin 7.5mg  po x 1 - Daily heparin level, PT/INR and CBC  Bayard HuggerMei Bell, PharmD, BCPS  Clinical Pharmacist  Pager: [phone]   07/30/2014,10:37 AM  Addn: Pharmacy is consulted to transition heparin drip to lovenox. Spoke with nurse, Liz BeachGabe, who stopped heparin drip at 1530. Will dose lovenox 1 mg/kg q12h for DVT treatment starting within 1 hour of discontinuation of heparin drip.  Arlean Hoppingorey M. Newman PiesBall, PharmD Clinical Pharmacist Pager [phone]

## 2014-07-30 NOTE — Evaluation (Signed)
Physical Therapy Evaluation Patient Details Name: Sabrina Long MRN: [id number] DOB: 1936-07-21 Today's Date: 07/30/2014   History of Present Illness  HPI: Sabrina Long is a 78 y.o. female WITH h/o dementia, chest pain, went to PCP office for left LE swelling, was found to have LLE DVT . She was sent to ED for evaluation of PE. Ct angiogram of the chest revealed bilateral PE, she was started on heparin drip and referred to us for admission. She is non verbal. Most of the hstory obtained from the son and the EDP  Clinical Impression  Pt admitted with/for L LE DVT, and found to have Bil PE.  Pt currently limited functionally due to the problems listed below.  (see problems list.)  Pt will benefit from PT to maximize function and safety to be able to get home safely with available assist of family.     Follow Up Recommendations Home health PT;Other (comment) (if son feels pt will be receptinve to therapy.)    Equipment Recommendations  Other (comment) (TBA, like none)    Recommendations for Other Services       Precautions / Restrictions Precautions Precautions: Fall Restrictions Weight Bearing Restrictions: No      Mobility  Bed Mobility Overal bed mobility: Needs Assistance Bed Mobility: Supine to Sit;Sit to Supine     Supine to sit: Mod assist Sit to supine: Mod assist;+2 for physical assistance   General bed mobility comments: tactile cues for guiding through task.  truncal/leg assist due to no initiaion and little follow through  Transfers Overall transfer level: Needs assistance Equipment used:  (chair back) Transfers: Sit to/from Stand Sit to Stand: Max assist         General transfer comment: Unable to attain fully erect standing posture at EOB x3 trials with eith face to face assist or assist from the side.  Ambulation/Gait             General Gait Details: unable today  Stairs            Wheelchair Mobility    Modified Rankin  (Stroke Patients Only)       Balance Overall balance assessment: Needs assistance Sitting-balance support: No upper extremity supported;Single extremity supported Sitting balance-Leahy Scale: Fair         Standing balance comment: attempting standing x3 without total success.                             Pertinent Vitals/Pain Pain Assessment: Faces Faces Pain Scale: No hurt    Home Living Family/patient expects to be discharged to:: Private residence Living Arrangements: Children Available Help at Discharge: Family;Available 24 hours/day Type of Home: House       Home Layout: One level Home Equipment: Emergency planning/management officerhower seat;Walker - 2 wheels      Prior Function Level of Independence: Needs assistance   Gait / Transfers Assistance Needed: Min A with assist with gait only needed over the last few weeks prior was supervision  ADL's / Homemaking Assistance Needed: Needs A for getting in shower and S while showering (usually showers standing up--they do have a seat but she does not use), assist for dressing and tolieting; does not do any of the IADLs        Hand Dominance        Extremity/Trunk Assessment   Upper Extremity Assessment: Difficult to assess due to impaired cognition;Generalized weakness (Bil grip WFL, assist with both UE's)  Lower Extremity Assessment: Generalized weakness;Difficult to assess due to impaired cognition (bears weight on both LE's,  Knees maintained in ~ 5* flexion)         Communication      Cognition Arousal/Alertness: Awake/alert Behavior During Therapy: WFL for tasks assessed/performed Overall Cognitive Status: Within Functional Limits for tasks assessed                      General Comments      Exercises        Assessment/Plan    PT Assessment Patient needs continued PT services  PT Diagnosis Generalized weakness   PT Problem List Decreased strength;Decreased activity tolerance;Decreased  balance;Decreased mobility;Decreased safety awareness  PT Treatment Interventions Gait training;Functional mobility training;Therapeutic activities;Balance training;Patient/family education   PT Goals (Current goals can be found in the Care Plan section) Acute Rehab PT Goals Patient Stated Goal: pt unable to participate. PT Goal Formulation: Patient unable to participate in goal setting Time For Goal Achievement: 08/06/14 Potential to Achieve Goals: Fair    Frequency Min 2X/week   Barriers to discharge        Co-evaluation               End of Session   Activity Tolerance: Patient tolerated treatment well;Other (comment) (limited by cognition) Patient left: in bed;with call bell/phone within reach;with bed alarm set Nurse Communication: Mobility status         Time: 1136-1208 PT Time Calculation (min): 32 min   Charges:   PT Evaluation $Initial PT Evaluation Tier I: 1 Procedure PT Treatments $Therapeutic Activity: 23-37 mins   PT G Codes:          Tamecia Mcdougald, Eliseo Gum 07/30/2014, 12:39 PM 07/30/2014  [place] Bing, PT [phone]  (pager)

## 2014-07-30 NOTE — Progress Notes (Signed)
Patient XB:[id number]:Sabrina Long Pod Ramnath      DOB: [birth year]      ZHY:[id number]  No family in the room when I arrived earlier to schedule appointment.  Called Daughter Arlene Morrision. She stated she was in a meeting a present and would need to call us back .  I asked her to coordinate a time with her other siblings. Will call the Palliative Phone. Patient comfortable and safe.   Lebron Nauert L. Ladona Ridgelaylor, MD MBA The Palliative Medicine Team at [facility] Team Phone: [phone] Pager: [phone] ( Use team phone after hours)

## 2014-07-30 NOTE — Progress Notes (Signed)
Called per floor RN for pt family concerns of new facial droop to the right. Per RN no facial droop observed. Pt daughter at bedside visiting from [place]. Upon my assessment, very slight draw to the right when mouth open. When mouth closed no draw assessed, appears equal. Pt with advanced dementia, unable to follow consistent commands. Unable to smile on command or with prompting. Observed pt moving mouth from side to side and moving both eyebrows equally. Pt moves all extremities equally, does not appear to be in pain. Occasionally speaks, no slurring assessed. Pt VSS, appears comfortable in bed. Rn advised to notify [facility] on call provider to update on family concerns. I suggest a neuro consult as needed to r/o stroke. Palliative care to see patient at some point, as soon as family and the palliative care team agree on a time.

## 2014-07-30 NOTE — Progress Notes (Signed)
PROGRESS NOTE    MARK BENECKE ZOX:[id number] DOB: [birth year] DOA: 07/27/2014 PCP: Bufford Spikes, DO  HPI/Brief narrative 78 year old female with history of advanced dementia, HTN, lives with son, presented to PCP with left lower extremity swelling which confirmed to extensive LLE DVT and CTA chest showed bilateral PE. CT chest was initially read as saddle PE not-however upon discussion with radiology on 10/7 indicates moderate PE without right heart strain. Patient currently on IV heparin bridging and PO Coumadin. Discussed with family regarding choice of continued Coumadin versus NOAC +/- IVC filter.   Assessment/Plan:  1. Left lower extremity DVT & bilateral PE: Admitted to telemetry. Has remained hemodynamically stable. No reported chest pain or dyspnea. Not hypoxic. Patient was started on IV heparin bridging and Coumadin. Discussed with radiology on 10/7, who reviewed CT and indicate that she has moderate PE without right heart strain and is not a candidate for TPA lysis. Discussed with family/Ms. Trellis Moment on 10/7 regarding choice of anticoagulation-Coumadin versus NOAC +/-IVC filter as suggested by radiology to prevent further PE > family has no further updates. 2. Advanced dementia: Mental status probably at baseline 3. Hypertension: Reasonable inpatient control. Continue losartan and metoprolol 4. Anemia: Stable.  5. Failure to thrive: Secondary to advanced dementia. As discussed and agreed upon with daughter Ms. Willette Alma on 10/7, palliative care consulted and for goals of care.  Code Status: Full Family Communication: Discussed with patient's daughter Ms Markeeta Scalf on 10/7 Disposition Plan: DC home when medically stable.   Consultants:  Radiology  Palliative Care Team - pending  Procedures:  None  Antibiotics:  None   Subjective: Pleasantly confused. Smiles inappropriately but nonverbal. As per nursing, no acute events. No  change  Objective: Filed Vitals:   07/29/14 1339 07/29/14 2030 07/30/14 0500 07/30/14 1034  BP: 138/75 103/84 148/73 152/78  Pulse: 80 92 94   Temp: 98.3 F (36.8 C) 98.9 F (37.2 C) 98.5 F (36.9 C)   TempSrc: Axillary Axillary Axillary   Resp: 18 17 18    Height:      Weight:      SpO2: 97% 97% 97%     Intake/Output Summary (Last 24 hours) at 07/30/14 1459 Last data filed at 07/29/14 1900  Gross per 24 hour  Intake    240 ml  Output      0 ml  Net    240 ml   Filed Weights   07/27/14 1500 07/27/14 1543  Weight: 65.8 kg (145 lb 1 oz) 62.596 kg (138 lb)     Exam:  General exam: Pleasant elderly female lying comfortably in bed. Respiratory system: Clear. No increased work of breathing. Cardiovascular system: S1 & S2 heard, RRR. No JVD, murmurs, gallops, clicks or pedal edema. Telemetry: Sinus rhythm. Gastrointestinal system: Abdomen is nondistended, soft and nontender. Normal bowel sounds heard. Central nervous system: Alert but not oriented. No focal neurological deficits. Extremities: Symmetric 5 x 5 power. Diffusely swollen left lower extremity without any other acute findings. Symmetrical peripheral pulses well felt.   Data Reviewed: Basic Metabolic Panel:  Recent Labs Lab 07/27/14 1337  NA 144  K 3.9  CL 107  CO2 23  GLUCOSE 123*  BUN 11  CREATININE 0.62  CALCIUM 9.7   Liver Function Tests: No results found for this basename: AST, ALT, ALKPHOS, BILITOT, PROT, ALBUMIN,  in the last 168 hours No results found for this basename: LIPASE, AMYLASE,  in the last 168 hours No results found for this basename: AMMONIA,  in the last 168 hours CBC:  Recent Labs Lab 07/27/14 1337 07/28/14 0017 07/29/14 0319 07/30/14 0433  WBC 8.7 7.6 6.5 6.6  NEUTROABS 5.6  --   --   --   HGB 12.2 10.5* 10.2* 10.2*  HCT 37.7 32.9* 31.3* 31.5*  MCV 92.9 92.7 92.1 92.4  PLT 285 272 278 296   Cardiac Enzymes: No results found for this basename: CKTOTAL, CKMB, CKMBINDEX,  TROPONINI,  in the last 168 hours BNP (last 3 results) No results found for this basename: PROBNP,  in the last 8760 hours CBG:  Recent Labs Lab 07/30/14 0540  GLUCAP 113*    Recent Results (from the past 240 hour(s))  MRSA PCR SCREENING     Status: None   Collection Time    07/28/14  9:24 AM      Result Value Ref Range Status   MRSA by PCR NEGATIVE  NEGATIVE Final   Comment:            The GeneXpert MRSA Assay (FDA     approved for NASAL specimens     only), is one component of a     comprehensive MRSA colonization     surveillance program. It is not     intended to diagnose MRSA     infection nor to guide or     monitor treatment for     MRSA infections.         Studies: No results found.      Scheduled Meds: . ALPRAZolam  0.25-0.5 mg Oral BID  . diclofenac sodium  1 application Topical BID  . hydrALAZINE  50 mg Oral TID  . losartan  25 mg Oral Daily  . memantine  10 mg Oral BID  . metoprolol tartrate  25 mg Oral BID  . multivitamin with minerals  1 tablet Oral Daily  . pantoprazole  40 mg Oral Daily  . pneumococcal 23 valent vaccine  0.5 mL Intramuscular Tomorrow-1000  . rosuvastatin  10 mg Oral Daily  . sertraline  100 mg Oral QHS  . warfarin  7.5 mg Oral ONCE-1800  . warfarin   Does not apply Once  . Warfarin - Pharmacist Dosing Inpatient   Does not apply q1800   Continuous Infusions: . heparin 950 Units/hr (07/28/14 2110)    Active Problems:   Esophageal reflux   Alzheimer's disease   Bilateral pulmonary embolism   DVT of lower limb, acute   Essential hypertension    Time spent: 30 minutes    Zaniel Marineau, MD, FACP, FHM. [facility] Pager [phone]  If 7PM-7AM, please contact night-coverage [email or web address] Password [email or web address] 07/30/2014, 2:59 PM    LOS: 3 days

## 2014-07-31 ENCOUNTER — Inpatient Hospital Stay (HOSPITAL_COMMUNITY): Payer: Medicare Other

## 2014-07-31 DIAGNOSIS — I2699 Other pulmonary embolism without acute cor pulmonale: Secondary | ICD-10-CM | POA: Diagnosis not present

## 2014-07-31 LAB — CBC
HEMATOCRIT: 33.4 % — AB (ref 36.0–46.0)
Hemoglobin: 10.7 g/dL — ABNORMAL LOW (ref 12.0–15.0)
MCH: 29.7 pg (ref 26.0–34.0)
MCHC: 32 g/dL (ref 30.0–36.0)
MCV: 92.8 fL (ref 78.0–100.0)
Platelets: 303 10*3/uL (ref 150–400)
RBC: 3.6 MIL/uL — ABNORMAL LOW (ref 3.87–5.11)
RDW: 14.5 % (ref 11.5–15.5)
WBC: 6.6 10*3/uL (ref 4.0–10.5)

## 2014-07-31 LAB — PROTIME-INR
INR: 1.31 (ref 0.00–1.49)
Prothrombin Time: 16.3 seconds — ABNORMAL HIGH (ref 11.6–15.2)

## 2014-07-31 MED ORDER — ENOXAPARIN (LOVENOX) PATIENT EDUCATION KIT
PACK | Freq: Once | Status: AC
  Administered 2014-07-31: 16:00:00
  Filled 2014-07-31: qty 1

## 2014-07-31 MED ORDER — ENOXAPARIN SODIUM 100 MG/ML ~~LOC~~ SOLN
90.0000 mg | SUBCUTANEOUS | Status: DC
  Administered 2014-07-31: 90 mg via SUBCUTANEOUS
  Filled 2014-07-31 (×2): qty 1

## 2014-07-31 MED ORDER — WARFARIN SODIUM 7.5 MG PO TABS
7.5000 mg | ORAL_TABLET | Freq: Once | ORAL | Status: AC
  Administered 2014-07-31: 7.5 mg via ORAL
  Filled 2014-07-31: qty 1

## 2014-07-31 MED ORDER — ENOXAPARIN SODIUM 60 MG/0.6ML ~~LOC~~ SOLN
60.0000 mg | Freq: Two times a day (BID) | SUBCUTANEOUS | Status: DC
  Filled 2014-07-31 (×2): qty 0.6

## 2014-07-31 NOTE — Progress Notes (Signed)
PROGRESS NOTE    Sabrina Long:[id number] DOB: Sep 08, 1936 DOA: 07/27/2014 PCP: Sabrina Spikes, DO  HPI/Brief narrative 78 year old female with history of advanced dementia, HTN, lives with son, presented to PCP with left lower extremity swelling which confirmed to extensive LLE DVT and CTA chest showed bilateral PE. CT chest was initially read as saddle PE not-however upon discussion with radiology on 10/7 indicates moderate PE without right heart strain. Patient currently on IV heparin bridging and PO Coumadin. Discussed with family regarding choice of continued Coumadin versus NOAC +/- IVC filter.   Assessment/Plan:  1. Left lower extremity DVT & bilateral PE: Admitted to telemetry. Has remained hemodynamically stable. No reported chest pain or dyspnea. Not hypoxic. Patient was started on IV heparin bridging and Coumadin. Discussed with radiology on 10/7, who reviewed CT and indicate that she has moderate PE without right heart strain and is not a candidate for TPA lysis. Discussed with family who wished to continue Coumadin for anticoagulation and do not seem to be interested in IVC filter. Discussed with Sabrina. Sabrina Long on 10/9 and expressed inability to accept patient back home today. Advised discharge home tomorrow. 2. Advanced dementia: Mental status probably at baseline. Her daughter visiting from [place] was concerned of facial asymmetry i.e. stroke. CT head without acute findings. No obvious facial asymmetry seen. 3. Hypertension: Reasonable inpatient control. Continue losartan and metoprolol 4. Anemia: Stable.  5. Failure to thrive: Secondary to advanced dementia. Family have been unable to agree amongst themselves and hence unable to coordinate appointment with palliative team. Advised family that hospice evaluation can be done as outpatient too.   Code Status: Full Family Communication: Discussed with patient's daughter Sabrina Long on 10/9 Disposition Plan: DC  home 10/10   Consultants:  Radiology  Palliative Care Team - pending  Procedures:  None  Antibiotics:  None   Subjective: Non verbal  Objective: Filed Vitals:   07/30/14 1034 07/30/14 1500 07/30/14 2040 07/31/14 0546  BP: 152/78 140/63 161/67 125/74  Pulse:  72 87 78  Temp:  98.2 F (36.8 C) 97.8 F (36.6 C) 97.7 F (36.5 C)  TempSrc:  Axillary Axillary Axillary  Resp:  19 18 16   Height:      Weight:      SpO2:  99% 99% 97%    Intake/Output Summary (Last 24 hours) at 07/31/14 1718 Last data filed at 07/30/14 1748  Gross per 24 hour  Intake    240 ml  Output      0 ml  Net    240 ml   Filed Weights   07/27/14 1500 07/27/14 1543  Weight: 65.8 kg (145 lb 1 oz) 62.596 kg (138 lb)     Exam:  General exam: Pleasant elderly female lying comfortably in bed. Respiratory system: Clear. No increased work of breathing. Cardiovascular system: S1 & S2 heard, RRR. No JVD, murmurs, gallops, clicks or pedal edema.  Gastrointestinal system: Abdomen is nondistended, soft and nontender. Normal bowel sounds heard. Central nervous system: Alert but not oriented. No focal neurological deficits. Extremities: Symmetric 5 x 5 power. Diffusely swollen left lower extremity without any other acute findings. Symmetrical peripheral pulses well felt.   Data Reviewed: Basic Metabolic Panel:  Recent Labs Lab 07/27/14 1337  NA 144  K 3.9  CL 107  CO2 23  GLUCOSE 123*  BUN 11  CREATININE 0.62  CALCIUM 9.7   Liver Function Tests: No results found for this basename: AST, ALT, ALKPHOS, BILITOT, PROT, ALBUMIN,  in the last 168 hours No results found for this basename: LIPASE, AMYLASE,  in the last 168 hours No results found for this basename: AMMONIA,  in the last 168 hours CBC:  Recent Labs Lab 07/27/14 1337 07/28/14 0017 07/29/14 0319 07/30/14 0433 07/31/14 0420  WBC 8.7 7.6 6.5 6.6 6.6  NEUTROABS 5.6  --   --   --   --   HGB 12.2 10.5* 10.2* 10.2* 10.7*  HCT 37.7  32.9* 31.3* 31.5* 33.4*  MCV 92.9 92.7 92.1 92.4 92.8  PLT 285 272 278 296 303   Cardiac Enzymes: No results found for this basename: CKTOTAL, CKMB, CKMBINDEX, TROPONINI,  in the last 168 hours BNP (last 3 results) No results found for this basename: PROBNP,  in the last 8760 hours CBG:  Recent Labs Lab 07/30/14 0540  GLUCAP 113*    Recent Results (from the past 240 hour(s))  MRSA PCR SCREENING     Status: None   Collection Time    07/28/14  9:24 AM      Result Value Ref Range Status   MRSA by PCR NEGATIVE  NEGATIVE Final   Comment:            The GeneXpert MRSA Assay (FDA     approved for NASAL specimens     only), is one component of a     comprehensive MRSA colonization     surveillance program. It is not     intended to diagnose MRSA     infection nor to guide or     monitor treatment for     MRSA infections.         Studies: Ct Head Wo Contrast  07/31/2014   CLINICAL DATA:  CVA, history of dementia  EXAM: CT HEAD WITHOUT CONTRAST  TECHNIQUE: Contiguous axial images were obtained from the base of the skull through the vertex without intravenous contrast.  COMPARISON:  None.  FINDINGS: There is no evidence of mass effect, midline shift, or extra-axial fluid collections. There is no evidence of a space-occupying lesion or intracranial hemorrhage. There is no evidence of a cortical-based area of acute infarction. There is generalized cerebral atrophy. There is periventricular white matter low attenuation likely secondary to microangiopathy.  The ventricles and sulci are appropriate for the patient's age. The basal cisterns are patent.  Visualized portions of the orbits are unremarkable. The visualized portions of the paranasal sinuses and mastoid air cells are unremarkable. Cerebrovascular atherosclerotic calcifications are noted.  The osseous structures are unremarkable.  IMPRESSION: No acute intracranial pathology.   Electronically Signed   By: Elige KoHetal  Patel   On: 07/31/2014  13:07        Scheduled Meds: . ALPRAZolam  0.25-0.5 mg Oral BID  . diclofenac sodium  1 application Topical BID  . enoxaparin (LOVENOX) injection  90 mg Subcutaneous Q24H  . enoxaparin   Does not apply Once  . hydrALAZINE  50 mg Oral TID  . losartan  25 mg Oral Daily  . memantine  10 mg Oral BID  . metoprolol tartrate  25 mg Oral BID  . multivitamin with minerals  1 tablet Oral Daily  . pantoprazole  40 mg Oral Daily  . rosuvastatin  10 mg Oral Daily  . sertraline  100 mg Oral QHS  . warfarin  7.5 mg Oral ONCE-1800  . warfarin   Does not apply Once  . Warfarin - Pharmacist Dosing Inpatient   Does not apply q1800   Continuous Infusions:  Active Problems:   Esophageal reflux   Alzheimer's disease   Bilateral pulmonary embolism   DVT of lower limb, acute   Essential hypertension    Time spent: 30 minutes    Isaiah Cianci, MD, FACP, FHM. [facility] Pager [phone]  If 7PM-7AM, please contact night-coverage [email or web address] Password [email or web address] 07/31/2014, 5:18 PM    LOS: 4 days

## 2014-07-31 NOTE — Progress Notes (Addendum)
ANTICOAGULATION CONSULT NOTE - Follow Up Consult  Pharmacy Consult for Lovenox/Coumadin Indication: DVT and PE  Allergies  Allergen Reactions  . Risperdal [Risperidone] Other (See Comments)    sedation    Patient Measurements: Height: 5' 4.17" (163 cm) Weight: 138 lb (62.596 kg) IBW/kg (Calculated) : 55.1 Heparin Dosing Weight:    Vital Signs: Temp: 97.7 F (36.5 C) (10/09 0546) Temp Source: Axillary (10/09 0546) BP: 125/74 mmHg (10/09 0546) Pulse Rate: 78 (10/09 0546)  Labs:  Recent Labs  07/28/14 1815  07/29/14 0319 07/30/14 0433 07/31/14 0420  HGB  --   < > 10.2* 10.2* 10.7*  HCT  --   --  31.3* 31.5* 33.4*  PLT  --   --  278 296 303  LABPROT  --   --  15.8* 15.5* 16.3*  INR  --   --  1.26 1.23 1.31  HEPARINUNFRC 0.72*  --  0.52 0.52  --   < > = values in this interval not displayed.  Estimated Creatinine Clearance: 52 ml/min (by C-G formula based on Cr of 0.62).  Assessment:  78 y.o. female with hx of dementia, presented with left LE swelling, and found to have LLE DVT, CT of the chest revealed bilateral PE  Anticoagulation: LLE DVT and bilateral PEs with right heart strain. D#5 heparin/coumadin bridge. Hgb 1072, plt wnl. No bleeding noted per chart. Dr. Waymon AmatoHongalgi wants to do NOAC, but family prefers coumadin, might consider IVC filter. INR up to 1.31 -Tried to do coumadin education, no evidence of learning, try to educate with family?  Cardiovascular: CAD, HLD, HTN, VSS, on Hydralazine, losartan, metoprolol, Crestor  Gastrointestinal / Nutrition: GERD, po ppi  Neurology: anxiety, advanced dementia - xanax, namenda, zoloft, Voltaren gel  Nephrology: Renal artery stenosis, but on losartan PTA, stented? scr 0.62, est. crcl ~ 50 ml/min  Pulmonary: PE, sats good on RA  Hematology / Oncology: hgb 10.7, sl down, plt wnl  PTA Medication Issues: adressed   Best Practices: Lovenox/coumadin    Goal of Therapy:  INR 2-3 Monitor platelets by anticoagulation  protocol: Yes   Plan:  Change Lovenox to 90mg  (1.5mg /kg/day) for discharge Coumadin 7.5mg  po x 1 again tonight.    Cassie Shedlock S. Merilynn Finlandobertson, PharmD, BCPS Clinical Staff Pharmacist Pager [phone]  Misty Stanleyobertson, Darrah Dredge Stillinger 07/31/2014,9:26 AM

## 2014-07-31 NOTE — Progress Notes (Signed)
Daughter expressed concern that her mother's mouth was drooping on the right side. Slight drooping noted. Pt unable to smile on command because of advanced dementia. Rapid response and Dr Craige CottaKirby notified. Will continue to monitor pt

## 2014-07-31 NOTE — Progress Notes (Signed)
Lovenox injection education completed with Pts son with whom she lives.  He successfully and correctly administered the injection himself after teach-back education.

## 2014-07-31 NOTE — Progress Notes (Signed)
Physical Therapy Treatment Patient Details Name: Sabrina Long MRN: [id number] DOB: 08/09/1936 Today's Date: 07/31/2014    History of Present Illness HPI: Sabrina Long is a 78 y.o. female WITH h/o dementia, chest pain, went to PCP office for left LE swelling, was found to have LLE DVT . She was sent to ED for evaluation of PE. Ct angiogram of the chest revealed bilateral PE, she was started on heparin drip and referred to us for admission. She is non verbal. Most of the hstory obtained from the son and the EDP    PT Comments    Pt with multiple family members in room today who provided prior functional status. Pt has been mostly bed ridden past 6 weeks due to wound on back that wound center did not want her moving and opening it up more. Prior to that it was reported that with standing the pt would buckle both legs unless given support and lots of cues to stand up. Focused on mobility today with pt not following any commands. Resistive to passive ROM of both legs. Will continue to follow during acute stay.   Follow Up Recommendations  Home health PT     Equipment Recommendations  Other (comment) (TBA)    Recommendations for Other Services       Precautions / Restrictions Precautions Precautions: Fall Restrictions Weight Bearing Restrictions: No    Mobility  Bed Mobility Overal bed mobility: Needs Assistance       Supine to sit: Max assist;HOB elevated Sit to supine: Max assist   General bed mobility comments: multi-modal cues needed for rollling and sitting up to side of bed.  Transfers Overall transfer level: Needs assistance Equipment used: None Transfers: Sit to/from Stand Sit to Stand: Max assist         General transfer comment: attempted sit<>stand multiple times with partial stand achieved x3 of 5 reps with bil knees blocked. pt needs multimodal cues and facilitation for upright posture as she is very flexed at hips/trunk with standing.                               Ambulation/Gait                 Stairs            Wheelchair Mobility    Modified Rankin (Stroke Patients Only)          Cognition Arousal/Alertness: Awake/alert Behavior During Therapy: Flat affect;WFL for tasks assessed/performed Overall Cognitive Status: History of cognitive impairments - at baseline                       Pertinent Vitals/Pain Pain Assessment: Faces Pain Location: pt resistive to leg movements and grabbing at PTA's hand during exercises Pain Intervention(s): Monitored during session;Repositioned     PT Goals (current goals can now be found in the care plan section) Acute Rehab PT Goals Patient Stated Goal: pt unable to participate. 07/31/14- spoke to caregiver via phone with famility today- their goal is to get pt standing for transfers at this time PT Goal Formulation: With family Time For Goal Achievement: 08/06/14 Potential to Achieve Goals: Fair Progress towards PT goals: Progressing toward goals    Frequency  Min 2X/week    PT Plan Current plan remains appropriate       End of Session Equipment Utilized During Treatment: Gait belt Activity Tolerance: Patient tolerated treatment well  Patient left: in bed;with call bell/phone within reach;with bed alarm set;with family/visitor present     Time: 1140-1205 PT Time Calculation (min): 25 min  Charges:  $Therapeutic Activity: 23-37 mins                    G Codes:      Sallyanne KusterBury, Alverda Nazzaro 07/31/2014, 1:15 PM  Sallyanne KusterKathy Averyanna Sax, PTA Office- [phone]

## 2014-08-01 LAB — PROTIME-INR
INR: 1.77 — ABNORMAL HIGH (ref 0.00–1.49)
PROTHROMBIN TIME: 20.6 s — AB (ref 11.6–15.2)

## 2014-08-01 MED ORDER — WARFARIN SODIUM 7.5 MG PO TABS
7.5000 mg | ORAL_TABLET | Freq: Once | ORAL | Status: DC
  Filled 2014-08-01: qty 1

## 2014-08-01 MED ORDER — WARFARIN SODIUM 7.5 MG PO TABS
7.5000 mg | ORAL_TABLET | Freq: Once | ORAL | Status: AC
  Administered 2014-08-01: 7.5 mg via ORAL
  Filled 2014-08-01: qty 1

## 2014-08-01 MED ORDER — ENOXAPARIN SODIUM 100 MG/ML ~~LOC~~ SOLN
90.0000 mg | SUBCUTANEOUS | Status: DC
  Administered 2014-08-01: 90 mg via SUBCUTANEOUS
  Filled 2014-08-01: qty 1

## 2014-08-01 MED ORDER — WARFARIN SODIUM 5 MG PO TABS
5.0000 mg | ORAL_TABLET | Freq: Every day | ORAL | Status: DC
Start: 2014-08-02 — End: 2014-08-19

## 2014-08-01 MED ORDER — ENOXAPARIN SODIUM 100 MG/ML ~~LOC~~ SOLN: 90.0000 mg | SUBCUTANEOUS | Status: DC

## 2014-08-01 NOTE — Progress Notes (Signed)
ANTICOAGULATION CONSULT NOTE - Follow Up Consult  Pharmacy Consult for Lovenox/Coumadin Indication: DVT and PE  Allergies  Allergen Reactions  . Risperdal [Risperidone] Other (See Comments)    sedation    Patient Measurements: Height: 5' 4.17" (163 cm) Weight: 138 lb (62.596 kg) IBW/kg (Calculated) : 55.1 Heparin Dosing Weight:    Vital Signs: Temp: 97.7 F (36.5 C) (10/10 0618) Temp Source: Oral (10/10 0618) BP: 141/65 mmHg (10/10 0618) Pulse Rate: 81 (10/10 0618)  Labs:  Recent Labs  07/30/14 0433 07/31/14 0420 08/01/14 0220  HGB 10.2* 10.7*  --   HCT 31.5* 33.4*  --   PLT 296 303  --   LABPROT 15.5* 16.3* 20.6*  INR 1.23 1.31 1.77*  HEPARINUNFRC 0.52  --   --     Estimated Creatinine Clearance: 52 ml/min (by C-G formula based on Cr of 0.62).  Assessment:  78 y.o. female with hx of dementia, presented with left LE swelling, and found to have LLE DVT, CT of the chest revealed bilateral PE  Anticoagulation: LLE DVT and bilateral PEs with right heart strain. D#6 heparin/coumadin bridge. Hgb 10.7, plt wnl. No bleeding noted per chart. Dr. Waymon AmatoHongalgi wants to do NOAC, but family prefers coumadin, might consider IVC filter. INR up to 1.77 on concomitant LMWH. -Tried to do coumadin education, no evidence of learning, try to educate with family?  Cardiovascular: CAD, HLD, HTN, VSS, on Hydralazine, losartan, metoprolol, Crestor  Gastrointestinal / Nutrition: GERD, po ppi  Neurology: anxiety, advanced dementia - xanax, namenda, zoloft, Voltaren gel  Nephrology: Renal artery stenosis, but on losartan PTA, stented? scr 0.62, est. crcl ~ 50 ml/min  Pulmonary: PE, sats good on RA  Hematology / Oncology: hgb 10.7, sl down, plt wnl  PTA Medication Issues: adressed   Best Practices: Lovenox/coumadin    Goal of Therapy:  INR 2-3 Monitor platelets by anticoagulation protocol: Yes   Plan:  Change Lovenox to 90mg  (1.5mg /kg/day) for discharge Coumadin 7.5mg  po x 1  again tonight.    Sabrina Long, PharmD, BCPS Clinical Staff Pharmacist Pager [phone]  Sabrina Long, Sabrina Long 08/01/2014,12:17 PM

## 2014-08-01 NOTE — Progress Notes (Signed)
Discharged to home with family office visits in place teaching done  

## 2014-08-01 NOTE — Discharge Summary (Signed)
Physician Discharge Summary  Sabrina Long ZOX:[id number] DOB: [birth year] DOA: 07/27/2014  PCP: Sabrina SpikesEED, [name], DO  Admit date: 07/27/2014 Discharge date: 08/01/2014  Time spent: Less than 30 minutes  Recommendations for Outpatient Follow-up:  1. Dr. Bufford Spikesiffany Long, PCP in 3 days-to be followed with repeat labs (CBC, PT & INR-drawn by home health RN) for Coumadin dose adjustment, timing of discontinuing Lovenox and further lab followup. 2. Recommend outpatient hospice consultation. 3. Home health PT and RN.  Discharge Diagnoses:  Active Problems:   Esophageal reflux   Alzheimer's disease   Bilateral pulmonary embolism   DVT of lower limb, acute   Essential hypertension   Discharge Condition: Improved & Stable  Diet recommendation: Heart healthy diet.  Filed Weights   07/27/14 1500 07/27/14 1543  Weight: 65.8 kg (145 lb 1 oz) 62.596 kg (138 lb)    History of present illness:  78 year old female with history of advanced dementia, HTN, lives with son, presented to PCP with left lower extremity swelling which confirmed to extensive LLE DVT and CTA chest showed bilateral PE. CT chest was initially read as saddle PE -however upon discussion with radiology on 10/7 indicates moderate PE without right heart strain.   Hospital Course:   1. Left lower extremity DVT & bilateral PE: Admitted to telemetry. Has remained hemodynamically stable. No reported chest pain or dyspnea. Not hypoxic. Patient was started on IV heparin bridging and Coumadin. Discussed with radiology on 10/7, who reviewed CT and indicate that she has moderate PE without right heart strain and is not a candidate for TPA lysis. Discussed with family who wished to continue Coumadin for anticoagulation and are not interested in NOAC/IVC filter. Patient was switched to of for dose Lovenox and is day #6 of heparin/Coumadin bridging. INR is starting to rise and is 1.7 today. Patient's son has been educated regarding  administration of Lovenox. She will be discharged on Lovenox and Coumadin. Repeat labs are to be followed on 10/12 and Lovenox can be discontinued if INR >2. Duration of anticoagulation should be at least 6 months but given patient's non-mobile status, may be indefinite unless she develops some contraindication. We'll defer duration of anticoagulation to outpatient PCP. She will receive her today's dose of Lovenox and Coumadin prior to discharge. 2. Advanced dementia: Mental status probably at baseline. Her daughter visiting from [place] was concerned of facial asymmetry i.e. stroke. CT head without acute findings. No obvious facial asymmetry seen. M.D. has discussed with several of patient's children regarding ongoing care. Offered palliative care consultation but due to difficulty coordinating with family, was not done. Hospice consultation can be achieved in the outpatient setting and family are aware and okay with this. 3. Hypertension: Reasonable inpatient control. Continue losartan and metoprolol 4. Anemia: Stable.  5. Failure to thrive: Secondary to advanced dementia. Advised family that hospice evaluation can be done as outpatient too.   Consultations:  Radiology  Procedures:  None    Discharge Exam:  Complaints:  Nonverbal. As per daughter Sabrina Long, patient has been much more alert and interactive since being in hospital. No acute events as per nursing.  Filed Vitals:   07/30/14 2040 07/31/14 0546 07/31/14 1940 08/01/14 0618  BP: 161/67 125/74 124/59 141/65  Pulse: 87 78 108 81  Temp: 97.8 F (36.6 C) 97.7 F (36.5 C) 98.3 F (36.8 C) 97.7 F (36.5 C)  TempSrc: Axillary Axillary Axillary Oral  Resp: 18 16 18 18   Height:      Weight:  SpO2: 99% 97% 100% 99%   General exam: Pleasant elderly female lying comfortably in bed.  Respiratory system: Clear. No increased work of breathing.  Cardiovascular system: S1 & S2 heard, RRR. No JVD, murmurs, gallops,  clicks or pedal edema.  Gastrointestinal system: Abdomen is nondistended, soft and nontender. Normal bowel sounds heard.  Central nervous system: Alert but not oriented. No focal neurological deficits.  Extremities: Symmetric 5 x 5 power. Diffusely swollen left lower extremity without any other acute findings- swelling decreasing. Symmetrical peripheral pulses well felt.  Discharge Instructions      Discharge Instructions   Call MD for:  difficulty breathing, headache or visual disturbances    Complete by:  As directed      Call MD for:  severe uncontrolled pain    Complete by:  As directed      Diet - low sodium heart healthy    Complete by:  As directed      Increase activity slowly    Complete by:  As directed             Medication List         alendronate 70 MG tablet  Commonly known as:  FOSAMAX  Take 70 mg by mouth once a week. Take with a full glass of water on an empty stomach.  On Tuesdays     ALPRAZolam 0.5 MG tablet  Commonly known as:  XANAX  Take 0.25-0.5 mg by mouth 2 (two) times daily. Take 1/2 tablet (0.25 mg) every morning and 1 tablet (0.5 mg) every night     diclofenac sodium 1 % Gel  Commonly known as:  VOLTAREN  Apply 1 application topically 2 (two) times daily. Apply to knees and hips     enoxaparin 100 MG/ML injection  Commonly known as:  LOVENOX  Inject 0.9 mLs (90 mg total) into the skin daily.  Start taking on:  08/02/2014     hydrALAZINE 50 MG tablet  Commonly known as:  APRESOLINE  Take 1 tablet (50 mg total) by mouth 3 (three) times daily.     losartan 25 MG tablet  Commonly known as:  COZAAR  Take 25 mg by mouth daily.     memantine 10 MG tablet  Commonly known as:  NAMENDA  Take 10 mg by mouth 2 (two) times daily.     metoprolol tartrate 25 MG tablet  Commonly known as:  LOPRESSOR  Take 25 mg by mouth 2 (two) times daily.     multivitamin with minerals Tabs tablet  Take 1 tablet by mouth daily. Centrum Silver      nitroGLYCERIN 0.4 MG SL tablet  Commonly known as:  NITROSTAT  Place 0.4 mg under the tongue every 5 (five) minutes as needed for chest pain. Take one tablet sublingual every 5 minutes not to exceed three tablets for chest pain     omeprazole 20 MG capsule  Commonly known as:  PRILOSEC  Take 20 mg by mouth daily.     rosuvastatin 10 MG tablet  Commonly known as:  CRESTOR  Take 10 mg by mouth daily.     sertraline 100 MG tablet  Commonly known as:  ZOLOFT  Take 100 mg by mouth at bedtime.     warfarin 5 MG tablet  Commonly known as:  COUMADIN  Take 1 tablet (5 mg total) by mouth daily at 6 PM.  Start taking on:  08/02/2014       Follow-up Information   Follow up  with Long, [name], DO. Schedule an appointment as soon as possible for a visit in 3 days. ( followup labs (CBC, PT & INR) that will be drawn by home health RN on 08/03/14 and sent to primary M.D. for Coumadin dose adjustment, timing of discontinuing Lovenox and further lab followup. Also recommend outpatient hospice consultation. )    Specialty:  Geriatric Medicine   Contact information:   [address] [place] [address] [phone]        The results of significant diagnostics from this hospitalization (including imaging, microbiology, ancillary and laboratory) are listed below for reference.    Significant Diagnostic Studies: Ct Head Wo Contrast  07/31/2014   CLINICAL DATA:  CVA, history of dementia  EXAM: CT HEAD WITHOUT CONTRAST  TECHNIQUE: Contiguous axial images were obtained from the base of the skull through the vertex without intravenous contrast.  COMPARISON:  None.  FINDINGS: There is no evidence of mass effect, midline shift, or extra-axial fluid collections. There is no evidence of a space-occupying lesion or intracranial hemorrhage. There is no evidence of a cortical-based area of acute infarction. There is generalized cerebral atrophy. There is periventricular white matter low attenuation likely  secondary to microangiopathy.  The ventricles and sulci are appropriate for the patient's age. The basal cisterns are patent.  Visualized portions of the orbits are unremarkable. The visualized portions of the paranasal sinuses and mastoid air cells are unremarkable. Cerebrovascular atherosclerotic calcifications are noted.  The osseous structures are unremarkable.  IMPRESSION: No acute intracranial pathology.   Electronically Signed   By: Elige Ko   On: 07/31/2014 13:07   Ct Angio Chest W/cm &/or Wo Cm  07/27/2014   CLINICAL DATA:  History of deep venous thrombosis. The patient is unresponsive.  EXAM: CT ANGIOGRAPHY CHEST WITH CONTRAST  TECHNIQUE: Multidetector CT imaging of the chest was performed using the standard protocol during bolus administration of intravenous contrast. Multiplanar CT image reconstructions and MIPs were obtained to evaluate the vascular anatomy.  CONTRAST:  80 mL OMNIPAQUE IOHEXOL 350 MG/ML SOLN  COMPARISON:  PA and lateral chest 05/03/2013 appear  FINDINGS: The study is positive for bilateral pulmonary emboli. Saddle embolus is seen at the bifurcation of the right main pulmonary artery with clot also identified and anterior left upper lobe and descending left intralobar branches. The right ventricle to left ventricle ratio is 1.5 consistent with right heart strain. No pleural or pericardial effusion. Heart size is normal. No axillary, hilar or mediastinal lymphadenopathy. No calcific coronary atherosclerosis is identified. The lungs demonstrate only mild dependent atelectasis. Incidentally imaged upper abdomen shows calcification in the posterior right hepatic lobe. No lytic or sclerotic bony lesion is identified.  Review of the MIP images confirms the above findings.  IMPRESSION: Study is positive for pulmonary embolus with evidence of right heart strain.  Critical Value/emergent results were called by telephone at the time of interpretation on 07/27/2014 at 3:19 pm to [facility], who verbally acknowledged these results.   Electronically Signed   By: Drusilla Kanner M.D.   On: 07/27/2014 15:43    Microbiology: Recent Results (from the past 240 hour(s))  MRSA PCR SCREENING     Status: None   Collection Time    07/28/14  9:24 AM      Result Value Ref Range Status   MRSA by PCR NEGATIVE  NEGATIVE Final   Comment:            The GeneXpert MRSA Assay (FDA  approved for NASAL specimens     only), is one component of a     comprehensive MRSA colonization     surveillance program. It is not     intended to diagnose MRSA     infection nor to guide or     monitor treatment for     MRSA infections.     Labs: Basic Metabolic Panel:  Recent Labs Lab 07/27/14 1337  NA 144  K 3.9  CL 107  CO2 23  GLUCOSE 123*  BUN 11  CREATININE 0.62  CALCIUM 9.7   Liver Function Tests: No results found for this basename: AST, ALT, ALKPHOS, BILITOT, PROT, ALBUMIN,  in the last 168 hours No results found for this basename: LIPASE, AMYLASE,  in the last 168 hours No results found for this basename: AMMONIA,  in the last 168 hours CBC:  Recent Labs Lab 07/27/14 1337 07/28/14 0017 07/29/14 0319 07/30/14 0433 07/31/14 0420  WBC 8.7 7.6 6.5 6.6 6.6  NEUTROABS 5.6  --   --   --   --   HGB 12.2 10.5* 10.2* 10.2* 10.7*  HCT 37.7 32.9* 31.3* 31.5* 33.4*  MCV 92.9 92.7 92.1 92.4 92.8  PLT 285 272 278 296 303   Cardiac Enzymes: No results found for this basename: CKTOTAL, CKMB, CKMBINDEX, TROPONINI,  in the last 168 hours BNP: BNP (last 3 results) No results found for this basename: PROBNP,  in the last 8760 hours CBG:  Recent Labs Lab 07/30/14 0540  GLUCAP 113*   Discussed at length with the patient's daughter/health care power of attorney Ms. [facility] at bedside.    Signed:  Marcellus Scott, MD, FACP, FHM. [facility] Pager [phone]  If 7PM-7AM, please contact night-coverage [email or web address] Password [email or web address] 08/01/2014, 1:49  PM

## 2014-08-03 ENCOUNTER — Encounter (HOSPITAL_BASED_OUTPATIENT_CLINIC_OR_DEPARTMENT_OTHER): Payer: Medicare Other | Attending: Plastic Surgery

## 2014-08-03 ENCOUNTER — Telehealth: Payer: Self-pay | Admitting: *Deleted

## 2014-08-03 DIAGNOSIS — I82402 Acute embolism and thrombosis of unspecified deep veins of left lower extremity: Secondary | ICD-10-CM

## 2014-08-03 DIAGNOSIS — I2699 Other pulmonary embolism without acute cor pulmonale: Secondary | ICD-10-CM

## 2014-08-03 DIAGNOSIS — L89152 Pressure ulcer of sacral region, stage 2: Secondary | ICD-10-CM

## 2014-08-03 DIAGNOSIS — G301 Alzheimer's disease with late onset: Secondary | ICD-10-CM

## 2014-08-03 DIAGNOSIS — L89153 Pressure ulcer of sacral region, stage 3: Secondary | ICD-10-CM | POA: Insufficient documentation

## 2014-08-03 LAB — PROTIME-INR: INR: 2 — AB (ref 0.9–1.1)

## 2014-08-03 NOTE — Telephone Encounter (Signed)
Patient daughter, Sabrina BlonderDenise called and stated that her mother was just released from the hospital with blood clots everywhere. The Dr. Gerlene FeePlaced her on Coumadin and stated that her primary Dr. Eulah CitizenWould need to order Hospice to come in and help manage. Daughter stated that patient cannot come into the office because she cannot walk. Please Advise.

## 2014-08-03 NOTE — Telephone Encounter (Signed)
Spoke with Abbey ChattersJessica Eubanks, NP and she stated to call Hospice and have it set up for them to go out. Called and [facility] # [phone] and they took the verbal order over the phone and stated that they would go out and also draw for Coumadin. Jessica Agreed.  Angelique BlonderDenise, daughter Notified.

## 2014-08-04 NOTE — Telephone Encounter (Signed)
Cathy with Hospice called and stated the Hospital [facility]. Hospice went ahead made an appointment with the son to meet with the family and will educate them with options.

## 2014-08-05 ENCOUNTER — Other Ambulatory Visit: Payer: Self-pay | Admitting: *Deleted

## 2014-08-05 ENCOUNTER — Telehealth: Payer: Self-pay | Admitting: *Deleted

## 2014-08-05 LAB — PROTIME-INR: Protime: 22.6 seconds — AB (ref 10.0–13.8)

## 2014-08-05 LAB — POCT INR: INR: 2 — AB (ref 0.9–1.1)

## 2014-08-05 LAB — CBC AND DIFFERENTIAL
HCT: 36 % (ref 36–46)
Hemoglobin: 11.7 g/dL — AB (ref 12.0–16.0)
Platelets: 388 10*3/uL (ref 150–399)
WBC: 8 10^3/mL

## 2014-08-05 MED ORDER — ENOXAPARIN SODIUM 100 MG/ML ~~LOC~~ SOLN: 90.0000 mg | SUBCUTANEOUS | Status: DC

## 2014-08-05 NOTE — Telephone Encounter (Signed)
Patient son requested to be [name]

## 2014-08-05 NOTE — Telephone Encounter (Signed)
[facility]/2014 [facility]  PT:  22.6 INR: 1.99  Current Dose of Coumadin 5mg  once daily and Lovenox Injections. [facility] is to stop the Lovenox Injection once the INR is greater than 2. Patient is on due to Pulmonary Embolisms and Lower Extremity Thrombosis. Per Dr. Darrin NipperPandey--Continue Lovenox and Coumadin current dose and recheck INR 08/07/2014. [facility] # [phone] Notified and agreed.

## 2014-08-07 ENCOUNTER — Telehealth: Payer: Self-pay | Admitting: *Deleted

## 2014-08-07 LAB — POCT INR: INR: 2.3 — AB (ref 0.9–1.1)

## 2014-08-07 NOTE — Telephone Encounter (Signed)
Cindy [name] called with Protime Results 08/07/2014  INR  2.3  Current dose of Coumadin is 5mg  daily and Lovenox injections. Per Dr. Reed---Stop the Lovenox Injections and continue Coumadin 5mg  daily and recheck INR on Monday [facility] Notified and Agreed.

## 2014-08-10 ENCOUNTER — Telehealth: Payer: Self-pay | Admitting: *Deleted

## 2014-08-10 DIAGNOSIS — L89153 Pressure ulcer of sacral region, stage 3: Secondary | ICD-10-CM | POA: Diagnosis not present

## 2014-08-10 NOTE — Telephone Encounter (Signed)
Protime Results 08/10/2014  INR:  2.2  Current Dose of Coumadin--5mg  once daily Per Dr. Reed--Continue same dose of Coumadin and recheck in 1 week. [facility]ified and agreed.

## 2014-08-12 ENCOUNTER — Encounter: Payer: Self-pay | Admitting: Internal Medicine

## 2014-08-17 ENCOUNTER — Encounter: Payer: Self-pay | Admitting: Internal Medicine

## 2014-08-17 ENCOUNTER — Ambulatory Visit (INDEPENDENT_AMBULATORY_CARE_PROVIDER_SITE_OTHER): Payer: Medicare Other | Admitting: Internal Medicine

## 2014-08-17 VITALS — BP 128/80 | HR 99 | Temp 97.8°F | Resp 10

## 2014-08-17 DIAGNOSIS — F0280 Dementia in other diseases classified elsewhere without behavioral disturbance: Secondary | ICD-10-CM

## 2014-08-17 DIAGNOSIS — G309 Alzheimer's disease, unspecified: Secondary | ICD-10-CM

## 2014-08-17 DIAGNOSIS — R410 Disorientation, unspecified: Secondary | ICD-10-CM

## 2014-08-17 DIAGNOSIS — I2699 Other pulmonary embolism without acute cor pulmonale: Secondary | ICD-10-CM

## 2014-08-17 DIAGNOSIS — I82402 Acute embolism and thrombosis of unspecified deep veins of left lower extremity: Secondary | ICD-10-CM

## 2014-08-17 DIAGNOSIS — I639 Cerebral infarction, unspecified: Secondary | ICD-10-CM

## 2014-08-17 LAB — POCT INR: INR: 3.6

## 2014-08-17 NOTE — Progress Notes (Signed)
Patient ID: Sabrina Long, female   DOB: November 12, 1935, 78 y.o.   MRN: [id number]   [facility]  [facility]  Code Status: DNR   Allergies  Allergen Reactions  . Risperdal [Risperidone] Other (See Comments)    sedation    Chief Complaint  Patient presents with  . Hospitalization Follow-up    Seen in hospital on 07/27/14- DX with bilateral PE    HPI: Patient is a 78 y.o. black female seen in the office today for hospital f/u.  She had been here last with leg swelling and I sent her for imaging that showed large diffuse DVT, her respiratory status was questionable and we sent her to the ED.  There she was found to have a large saddle PE.  She was started on coumadin and hospice was recommended.  She is receiving home care.  [facility]  Hospice told them her prognosis is too good at that stage.  She is getting INRs checked by the hospice nurse and this was meant to occur today at 1pm, but she had this appt at 1:15pm.  She reluctantly allowed our MA to check it and it was 3.6.  Her son thinks maybe she got an extra dose this week.  A woman came in to shower her and she may have given it to her also.    She is sleeping all day and up all night.  Seems to happen only on weekend.  Was up sat and sun night past couple of weeks.  Discussed trying to keep her up in the daytime to make her tired at night.  Is eating better.    Review of Systems:  Review of Systems  Unable to perform ROS: dementia  nonverbal, only laughs  Past Medical History  Diagnosis Date  . Unspecified constipation   . Anxiety state, unspecified   . Colitis, enteritis, and gastroenteritis of presumed infectious origin 11/02/2011  . Pneumonia, organism unspecified 11/02/2011  . Cystitis, unspecified   . Impetigo   . Renal artery stenosis   . Dementia in conditions classified elsewhere with behavioral disturbance   . Hypercalcemia   . Reflux  esophagitis   . Chest pain, unspecified   . Abnormality of gait   . Hyperosmolality and/or hypernatremia   . Unspecified disorder of liver   . Candidiasis of vulva and vagina   . Alzheimer's disease   . Chest pain, unspecified 08/19/2008  . Dementia in conditions classified elsewhere without behavioral disturbance   . Depressive disorder, not elsewhere classified   . Esophageal reflux   . Other and unspecified hyperlipidemia   . Loss of weight   . Unspecified persistent mental disorders due to conditions classified elsewhere   . Reactive confusion   . Unspecified essential hypertension   . Other seborrheic keratosis   . Osteoarthrosis, unspecified whether generalized or localized, unspecified site   . Stiffness of joint, not elsewhere classified, unspecified site   . Memory loss 07/24/2007  . Headache(784.0)   . Undiagnosed cardiac murmurs     Past Surgical History  Procedure Laterality Date  . Cesarean section      Social History:   reports that she has never smoked. She has never used smokeless tobacco. She reports that she does not drink alcohol or use illicit drugs.  No family history on file.  Medications: Patient's Medications  New Prescriptions   No medications on file  Previous Medications  ALENDRONATE (FOSAMAX) 70 MG TABLET    Take 70 mg by mouth once a week. Take with a full glass of water on an empty stomach.  On Tuesdays   ALPRAZOLAM (XANAX) 0.5 MG TABLET    Take 0.25-0.5 mg by mouth 2 (two) times daily. Take 1/2 tablet (0.25 mg) every morning and 1 tablet (0.5 mg) every night   DICLOFENAC SODIUM (VOLTAREN) 1 % GEL    Apply 1 application topically 2 (two) times daily. Apply to knees and hips   ENOXAPARIN (LOVENOX) 100 MG/ML INJECTION    Inject 0.9 mLs (90 mg total) into the skin daily.   HYDRALAZINE (APRESOLINE) 50 MG TABLET    Take 1 tablet (50 mg total) by mouth 3 (three) times daily.   LOSARTAN (COZAAR) 25 MG TABLET    Take 25 mg by mouth daily.    MEMANTINE (NAMENDA) 10 MG TABLET    Take 10 mg by mouth 2 (two) times daily.   METOPROLOL TARTRATE (LOPRESSOR) 25 MG TABLET    Take 25 mg by mouth 2 (two) times daily.    MULTIPLE VITAMIN (MULTIVITAMIN WITH MINERALS) TABS TABLET    Take 1 tablet by mouth daily. Centrum Silver   NITROGLYCERIN (NITROSTAT) 0.4 MG SL TABLET    Place 0.4 mg under the tongue every 5 (five) minutes as needed for chest pain. Take one tablet sublingual every 5 minutes not to exceed three tablets for chest pain   OMEPRAZOLE (PRILOSEC) 20 MG CAPSULE    Take 20 mg by mouth daily.   ROSUVASTATIN (CRESTOR) 10 MG TABLET    Take 10 mg by mouth daily.   SERTRALINE (ZOLOFT) 100 MG TABLET    Take 100 mg by mouth at bedtime.    WARFARIN (COUMADIN) 5 MG TABLET    Take 1 tablet (5 mg total) by mouth daily at 6 PM.  Modified Medications   No medications on file  Discontinued Medications   No medications on file     Physical Exam: Filed Vitals:   08/17/14 1322  BP: 128/80  Pulse: 99  Temp: 97.8 F (36.6 C)  TempSrc: Oral  Resp: 10  SpO2: 91%  Physical Exam  Constitutional: No distress.  Frail female in wheelchair, having difficulty staying awake in the daytime and leaning to her right  Cardiovascular: Regular rhythm, normal heart sounds and intact distal pulses.   tachy  Pulmonary/Chest: Effort normal and breath sounds normal. No respiratory distress.  Abdominal: Soft. Bowel sounds are normal. She exhibits no distension and no mass. There is no tenderness.  Musculoskeletal:  Edema of left leg has improved, but still larger than right  Skin: Skin is warm and dry.  Psychiatric: She has a normal mood and affect.    Labs reviewed: Basic Metabolic Panel:  Recent Labs  45/40/9802/12/07 1301  02/17/14 1935  02/19/14 1415 02/20/14 0745 07/27/14 1337  NA 146*  < >  --   < > 153* 145 144  K 4.1  < >  --   < > 3.5* 3.3* 3.9  CL 107  < >  --   < > 115* 109 107  CO2 22  < >  --   < > 22 23 23   GLUCOSE 125*  < >  --   < > 138*  145* 123*  BUN 14  < >  --   < > 10 7 11   CREATININE 0.85  < >  --   < > 0.89 0.81 0.62  CALCIUM 9.9  < >  --   < >  9.4 9.2 9.7  TSH 2.180  --  2.330  --   --   --   --   < > = values in this interval not displayed. Liver Function Tests:  Recent Labs  01/22/14 1301  AST 15  ALT 12  ALKPHOS 71  BILITOT <0.2  PROT 7.6   No results found for this basename: LIPASE, AMYLASE,  in the last 8760 hours  Recent Labs  02/17/14 1935  AMMONIA 54   CBC:  Recent Labs  01/22/14 1301 02/17/14 1047  07/27/14 1337  07/29/14 0319 07/30/14 0433 07/31/14 0420 08/05/14  WBC 6.8 8.7  < > 8.7  < > 6.5 6.6 6.6 8.0  NEUTROABS 3.4 5.7  --  5.6  --   --   --   --   --   HGB 12.6 13.8  < > 12.2  < > 10.2* 10.2* 10.7* 11.7*  HCT 38.7 45.3  < > 37.7  < > 31.3* 31.5* 33.4* 36  MCV 97 102.7*  < > 92.9  < > 92.1 92.4 92.8  --   PLT 295 139*  < > 285  < > 278 296 303 388  < > = values in this interval not displayed. Lipid Panel: No results found for this basename: CHOL, HDL, LDLCALC, TRIG, CHOLHDL, LDLDIRECT,  in the last 8760 hours Lab Results  Component Value Date   HGBA1C 6.1* 02/17/2014    Past Procedures:  Assessment/Plan 1. Pulmonary embolism, bilateral - POC INR was 3.6 -hold coumadin today and tomorrow and recheck wed 08/19/14 -her son does not want to change her to an alternative, but did provide options of xarelto and eliquis to him on the AVS--given her overall prognosis and AD, she would probably do better not to have regular INR checks  2. Alzheimer's disease -end stage in my view -is nonverbal for most part and requires assist with all adls -I think her family gave the hospice staff a better impression of her function than reality at this point--I don't think they are ready for hospice at this point  3. Delirium -improved, emphasized importance of straightening out her sleep-wake cycles to prevent this from getting worse -try to avoid using the xanax for sleep, try melatonin  if needed  4. Left leg DVT -see #1 for coumadin instructions -[facility] nurse to come out next on wed to recheck INR after holding 2 days  Labs/tests ordered:  None today Next appt:  3 mos  Giara Mcgaughey L. Dreydon Cardenas, D.O. Geriatrics [facility] [address], [place] [address] Cell Phone (Mon-Fri 8am-5pm):  [phone] On Call:  [phone] & follow prompts after 5pm & weekends Office Phone:  [phone] Office Fax:  [phone]

## 2014-08-17 NOTE — Patient Instructions (Addendum)
Alternatives to coumadin: Xarelto Eliquis  Hold coumadin for 10/26 and 10/27 and recheck INR 10/28  Try to keep her awake in the daytime so she is tired at night.  You could try using melatonin 3mg  capsule about 1 hour before you would like her to go to sleep.  This may help and is better for her than the xanax.

## 2014-08-19 ENCOUNTER — Telehealth: Payer: Self-pay | Admitting: *Deleted

## 2014-08-19 LAB — POCT INR: INR: 2.1 — AB (ref 0.9–1.1)

## 2014-08-19 MED ORDER — WARFARIN SODIUM 3 MG PO TABS: ORAL_TABLET | ORAL | Status: DC

## 2014-08-19 NOTE — Telephone Encounter (Signed)
Sabrina Long with [facility] --Protime Results 08/19/2014  INR:  2.1  Currently Holding Coumadin  Per Dr. Glade LloydPandey ---Start Coumadin 3mg  and recheck on Friday 10/30 [facility] and she will contact the son. Faxed Rx for 3mg  to pharmacy.

## 2014-08-20 ENCOUNTER — Encounter: Payer: Self-pay | Admitting: Podiatrist

## 2014-08-20 ENCOUNTER — Ambulatory Visit (INDEPENDENT_AMBULATORY_CARE_PROVIDER_SITE_OTHER): Payer: Medicare Other | Admitting: Podiatrist

## 2014-08-20 DIAGNOSIS — M79676 Pain in unspecified toe(s): Secondary | ICD-10-CM

## 2014-08-20 DIAGNOSIS — B351 Tinea unguium: Secondary | ICD-10-CM

## 2014-08-21 ENCOUNTER — Telehealth: Payer: Self-pay | Admitting: *Deleted

## 2014-08-21 NOTE — Telephone Encounter (Signed)
Protime Results 08/21/2014  INR:  1.8  Current Dose of Coumadin is 2.5mg  Per Dr. Reed---Continue Coumadin 2.5mg  and recheck in 1 week [facility] and Left Message to return call.

## 2014-08-24 ENCOUNTER — Encounter (HOSPITAL_BASED_OUTPATIENT_CLINIC_OR_DEPARTMENT_OTHER): Payer: Medicare Other | Attending: Plastic Surgery

## 2014-08-24 DIAGNOSIS — L89153 Pressure ulcer of sacral region, stage 3: Secondary | ICD-10-CM | POA: Insufficient documentation

## 2014-08-24 DIAGNOSIS — L89221 Pressure ulcer of left hip, stage 1: Secondary | ICD-10-CM | POA: Insufficient documentation

## 2014-08-24 NOTE — Progress Notes (Signed)
HPI:  Patient presents today for follow up of foot and nail care. Denies any new complaints today.  Objective:  Patients chart is reviewed.  Vascular status reveals pedal pulses noted at 1 out of 4 dp and pt bilateral .  Neurological sensation is unchanged.  Patients nails are thickened, discolored, distrophic, friable and brittle with yellow-brown discoloration. Patient subjectively relates they are painful with shoes and with ambulation of bilateral feet.  Assessment:  Symptomatic onychomycosis  Plan:  Discussed treatment options and alternatives.  The symptomatic toenails were debrided through manual an mechanical means without complication.  Return appointment recommended at routine intervals of 3 months    Kathryn Egerton, DPM   

## 2014-08-25 ENCOUNTER — Telehealth: Payer: Self-pay | Admitting: *Deleted

## 2014-08-25 LAB — POCT INR: INR: 1.5 — AB (ref 0.9–1.1)

## 2014-08-25 MED ORDER — WARFARIN SODIUM 4 MG PO TABS: 4.0000 mg | ORAL_TABLET | Freq: Every day | ORAL | Status: DC

## 2014-08-25 NOTE — Telephone Encounter (Signed)
Protime Results 08/25/2014  INR: 1.5  Current dose of Coumadin 3mg  daily. Per Dr. Daphene CalamityPandey--Increase Coumadin to 4mg  and recheck INR next Tuesday 11/10 [facility]indy with [name] Notified and agreed and asked to fax Rx into pharmacy. Faxed.

## 2014-08-26 ENCOUNTER — Other Ambulatory Visit: Payer: Self-pay | Admitting: *Deleted

## 2014-08-26 MED ORDER — ALENDRONATE SODIUM 70 MG PO TABS: 70.0000 mg | ORAL_TABLET | ORAL | Status: DC

## 2014-08-26 NOTE — Telephone Encounter (Signed)
[facility]

## 2014-08-27 ENCOUNTER — Other Ambulatory Visit: Payer: Self-pay | Admitting: *Deleted

## 2014-08-27 MED ORDER — ALENDRONATE SODIUM 70 MG PO TABS: 70.0000 mg | ORAL_TABLET | ORAL | Status: DC

## 2014-08-27 NOTE — Addendum Note (Signed)
Addended by: Maurice SmallBEATTY, Jeziel Hoffmann C on: 08/27/2014 02:28 PM   Modules accepted: Orders

## 2014-08-27 NOTE — Telephone Encounter (Signed)
[facility]

## 2014-08-31 ENCOUNTER — Telehealth: Payer: Self-pay

## 2014-08-31 ENCOUNTER — Other Ambulatory Visit: Payer: Self-pay | Admitting: *Deleted

## 2014-08-31 DIAGNOSIS — L89221 Pressure ulcer of left hip, stage 1: Secondary | ICD-10-CM | POA: Diagnosis not present

## 2014-08-31 DIAGNOSIS — L89153 Pressure ulcer of sacral region, stage 3: Secondary | ICD-10-CM | POA: Diagnosis present

## 2014-08-31 MED ORDER — ALENDRONATE SODIUM 70 MG PO TABS: 70.0000 mg | ORAL_TABLET | ORAL | Status: DC

## 2014-08-31 NOTE — Telephone Encounter (Signed)
Patient's son called stating is mother has left arm swelling since last night. Patient with no other symptoms. Patient is currently on a blood thinner. No available appointments with either provider tomorrow (in office).  Please advise

## 2014-08-31 NOTE — Telephone Encounter (Signed)
Per Shanda BumpsJessica, NP patient/patient's son to access symptoms for fever, increased pain, or warm to the touch. If symptoms present or develop patient to be seen at Urgent Care otherwise message ok to wait for review by PCP.  Spoke with patient's son, [name] verbalized understanding of symptoms to access patient for, [name] states patient with no symptoms other than swelling in arm. [name] will wait for Dr.Reed's recommendations.  No available appointment's for tomorrow or Wednesday

## 2014-08-31 NOTE — Telephone Encounter (Signed)
[facility]

## 2014-08-31 NOTE — Telephone Encounter (Signed)
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  INR recheck is Tues, 11/10 [facility].  Unless she develops redness, warmth, discoloration of her hand or loss of pulse in her hand, we will not make changes until we know what her INR is with coumadin 4mg  daily.  If it remains under 2, she will need lovenox for a couple of days until it reaches 2.

## 2014-09-01 NOTE — Telephone Encounter (Signed)
Sabrina Long is aware of Dr.Reeds response. Sab[facility] Long [facility] that [facility] to come [facility] today, not sure what time. Nurse will call with results once PT/INR resulted.

## 2014-09-02 LAB — PROTIME-INR: Protime: 20.2 seconds — AB (ref 10.0–13.8)

## 2014-09-02 LAB — POCT INR: INR: 1.7 — AB (ref 0.9–1.1)

## 2014-09-02 NOTE — Telephone Encounter (Signed)
[facility]  PT:  20.2 INR:  1.72  Current Dose of Coumadin is 4mg  daily.  Per Dr. Duffy RhodyPandey--Start Coumadin 5 mg daily and recheck INR in 1 week. Dr. Glade LloydPandey is aware of the attached message. [name] return call. Notified Arlene, daughter and she agreed.

## 2014-09-08 ENCOUNTER — Telehealth: Payer: Self-pay | Admitting: *Deleted

## 2014-09-08 LAB — POCT INR: INR: 1.6 — AB (ref 0.9–1.1)

## 2014-09-08 MED ORDER — WARFARIN SODIUM 6 MG PO TABS: 6.0000 mg | ORAL_TABLET | Freq: Every day | ORAL | Status: DC

## 2014-09-08 NOTE — Telephone Encounter (Signed)
Increase coumadin to 6mg  po daily.  Recheck INR on Thursday.

## 2014-09-08 NOTE — Telephone Encounter (Signed)
[facility]ified and she will notify patient. Called in Coumadin 6mg  into pharmacy. Sabrina AspCindy will recheck Protime on Thursday.

## 2014-09-08 NOTE — Telephone Encounter (Signed)
Sabrina Long with [name] called with Protime Results 09/08/2014  INR:  1.6  Current Dose of Coumadin:  5mg  once daily Sent to Dr. Renato Gailseed to review and sign.

## 2014-09-10 ENCOUNTER — Other Ambulatory Visit: Payer: Self-pay | Admitting: Internal Medicine

## 2014-09-11 ENCOUNTER — Telehealth: Payer: Self-pay | Admitting: *Deleted

## 2014-09-11 NOTE — Telephone Encounter (Signed)
Sabrina Long , called to report her IR results of 1.8 / 6 mg. Coumadin. Called  Arline AspCindy to report that Dr. Renato Gailseed stated that she would like to keep the coumadin the same and recheck the IR on Monday.

## 2014-09-14 ENCOUNTER — Telehealth: Payer: Self-pay | Admitting: *Deleted

## 2014-09-14 NOTE — Telephone Encounter (Signed)
Returned call to Selena BattenKim to tell Selena BattenKim that Dr. Renato Gailseed said to recheck the PT/IR on Monday.

## 2014-09-15 ENCOUNTER — Other Ambulatory Visit: Payer: Self-pay | Admitting: Internal Medicine

## 2014-09-18 ENCOUNTER — Other Ambulatory Visit: Payer: Self-pay | Admitting: Internal Medicine

## 2014-09-21 ENCOUNTER — Telehealth: Payer: Self-pay | Admitting: *Deleted

## 2014-09-21 NOTE — Telephone Encounter (Signed)
Cindy with [name] called with Protime Results from 09/21/2014  INR:  2.9  Current Dose of Coumadin is 6mg  daily Per Dr. Reed---Hold today and tomorrow and recheck on Wednesday [facility] and agreed.

## 2014-09-23 ENCOUNTER — Telehealth: Payer: Self-pay | Admitting: *Deleted

## 2014-09-23 LAB — POCT INR: INR: 2 — AB (ref 0.9–1.1)

## 2014-09-23 NOTE — Telephone Encounter (Signed)
Sabrina Long with [name] called with Protime Results 09/23/2014  INR:  2.0  Patient has been holding Coumadin for 2 days. Per Dr. Reed---Patient to restart Coumadin 5mg  and recheck on Friday. [facility]

## 2014-09-25 ENCOUNTER — Encounter: Payer: Self-pay | Admitting: *Deleted

## 2014-09-25 ENCOUNTER — Telehealth: Payer: Self-pay | Admitting: *Deleted

## 2014-09-25 LAB — POCT INR: INR: 1.9 — AB (ref 0.9–1.1)

## 2014-09-25 NOTE — Telephone Encounter (Signed)
Cindy with [name] called with Protime Results 09/25/2014  INR:  1.9  Current Dose of Coumadin 5mg  daily Per Dr. Reed---Continue Coumadin 5mg  and recheck on Monday [facility] and agreed.

## 2014-09-27 ENCOUNTER — Other Ambulatory Visit: Payer: Self-pay | Admitting: Internal Medicine

## 2014-09-28 ENCOUNTER — Telehealth: Payer: Self-pay | Admitting: *Deleted

## 2014-09-28 LAB — POCT INR: INR: 2.3 — AB (ref 0.9–1.1)

## 2014-09-28 MED ORDER — WARFARIN SODIUM 5 MG PO TABS: ORAL_TABLET | ORAL | Status: DC

## 2014-09-28 NOTE — Telephone Encounter (Signed)
Cindy with [name] called with Protime Results 09/28/2014  INR:  2.3  Current Dose of Coumadin is 5mg  daily Per Dr. Reed---Continue 5mg  of Coumadin and recheck on Friday [facility] and agreed. Rx for Coumadin 5mg  faxed to pharmacy and she is aware.

## 2014-09-29 ENCOUNTER — Other Ambulatory Visit: Payer: Self-pay | Admitting: Internal Medicine

## 2014-10-02 ENCOUNTER — Telehealth: Payer: Self-pay | Admitting: Internal Medicine

## 2014-10-02 DIAGNOSIS — I82402 Acute embolism and thrombosis of unspecified deep veins of left lower extremity: Secondary | ICD-10-CM

## 2014-10-02 DIAGNOSIS — G301 Alzheimer's disease with late onset: Secondary | ICD-10-CM

## 2014-10-02 DIAGNOSIS — I2699 Other pulmonary embolism without acute cor pulmonale: Secondary | ICD-10-CM

## 2014-10-02 DIAGNOSIS — F0280 Dementia in other diseases classified elsewhere without behavioral disturbance: Secondary | ICD-10-CM

## 2014-10-02 NOTE — Telephone Encounter (Signed)
[facility] call with Coumdin results  2.1 INR  Arline AspCindy says family has not picked up the new dose of coumdin of 2.0 mg so the patient is  Still taking the 4.0mg  as of today. Per Dr. Renato Gailseed stay on the 4.0mg  until the new dose of medication is received, and recheck on Monday, Dec 14th. Gave information to [place]...  cdavis

## 2014-10-05 ENCOUNTER — Telehealth: Payer: Self-pay | Admitting: *Deleted

## 2014-10-05 LAB — POCT INR: INR: 2 — AB (ref 0.9–1.1)

## 2014-10-05 NOTE — Telephone Encounter (Signed)
Protime Results 10/05/2014  INR:  2.0  Current Dose of Coumadin: 4mg  daily Per Dr. Reed--Continue same dose of coumadin and recheck on Friday Sherryle Lisindy, [facility] Nurse Notified and agreed and will contact patient.

## 2014-10-09 ENCOUNTER — Telehealth: Payer: Self-pay | Admitting: *Deleted

## 2014-10-09 LAB — POCT INR: INR: 2.1 — AB (ref 0.9–1.1)

## 2014-10-09 NOTE — Telephone Encounter (Signed)
Cindy with [name] called with Protime Results 10/09/2014  INR:  2.1  Current Dose of Coumadin is 4mg  daily. Printed and given to Dr. Montez Moritaarter to Address.

## 2014-10-09 NOTE — Telephone Encounter (Signed)
Per Dr. Burman Riisarter---Continue current dose of Coumadin INR therapeutic and recheck PT/INR in 2 weeks. [facility].

## 2014-10-19 ENCOUNTER — Other Ambulatory Visit: Payer: Self-pay | Admitting: Internal Medicine

## 2014-10-22 ENCOUNTER — Other Ambulatory Visit: Payer: Self-pay | Admitting: Adult Health

## 2014-10-27 ENCOUNTER — Telehealth: Payer: Self-pay | Admitting: *Deleted

## 2014-10-27 NOTE — Telephone Encounter (Signed)
Per Dr. Darrin NipperPandey---Continue same dose of Coumadin and recheck in 1 month. LMOM for Cindy with [name] to return my call.

## 2014-10-27 NOTE — Telephone Encounter (Signed)
[facility]ified and agreed and will contact patient.

## 2014-10-27 NOTE — Telephone Encounter (Signed)
Sabrina Long with [name] called with Protime Results 10/27/2014  INR: 2.4  Current Dose of Coumadin is 4mg  daily Printed and given to Dr. Glade LloydPandey to review

## 2014-10-29 ENCOUNTER — Other Ambulatory Visit: Payer: Self-pay | Admitting: *Deleted

## 2014-10-29 MED ORDER — ALPRAZOLAM 0.5 MG PO TABS: ORAL_TABLET | ORAL | Status: DC

## 2014-10-29 NOTE — Telephone Encounter (Signed)
[facility]

## 2014-10-30 ENCOUNTER — Ambulatory Visit (INDEPENDENT_AMBULATORY_CARE_PROVIDER_SITE_OTHER): Payer: Medicare Other | Admitting: Internal Medicine

## 2014-10-30 ENCOUNTER — Encounter: Payer: Self-pay | Admitting: Internal Medicine

## 2014-10-30 VITALS — BP 110/68 | HR 94 | Temp 98.0°F | Resp 10

## 2014-10-30 DIAGNOSIS — F0280 Dementia in other diseases classified elsewhere without behavioral disturbance: Secondary | ICD-10-CM

## 2014-10-30 DIAGNOSIS — E43 Unspecified severe protein-calorie malnutrition: Secondary | ICD-10-CM

## 2014-10-30 DIAGNOSIS — T148 Other injury of unspecified body region: Secondary | ICD-10-CM

## 2014-10-30 DIAGNOSIS — R609 Edema, unspecified: Secondary | ICD-10-CM

## 2014-10-30 DIAGNOSIS — T148XXA Other injury of unspecified body region, initial encounter: Secondary | ICD-10-CM

## 2014-10-30 DIAGNOSIS — L89890 Pressure ulcer of other site, unstageable: Secondary | ICD-10-CM

## 2014-10-30 DIAGNOSIS — G309 Alzheimer's disease, unspecified: Secondary | ICD-10-CM

## 2014-10-30 MED ORDER — WARFARIN SODIUM 4 MG PO TABS: ORAL_TABLET | ORAL | Status: DC

## 2014-10-30 MED ORDER — HYDRALAZINE HCL 50 MG PO TABS: 50.0000 mg | ORAL_TABLET | Freq: Three times a day (TID) | ORAL | Status: DC

## 2014-10-30 NOTE — Progress Notes (Signed)
Patient ID: Sabrina Long, female   DOB: 09-12-1936, 79 y.o.   MRN: [id number]   Location:  [facility]  Code Status: full code  Allergies  Allergen Reactions  . Risperdal [Risperidone] Other (See Comments)    sedation    Chief Complaint  Patient presents with  . Acute Visit    Bilateral foot swelling, left hand swelling and raised area on left inner thigh (x 2 days )   . Bleeding/Bruising    Bruise on heels and inner legs (knee area) x 1 week     HPI: Patient is a 79 y.o. black female with end stage dementia (nonverbal, no longer ambulatory, has had failure to thrive that got much worse after saddle PE and DVTs) seen in the office today for an acute visit due to increased lower extremity swelling, new areas of skin breakdown and excoriation.    Her son is her primary caretaker.  He has noticed she now has swelling of both of her lower legs and feet.  She has two areas of skin breakdown on her medial lower legs (opposing each other), purple discoloration of her heels and some scratches and fluid collection on her right medial thigh.  The ulcerations began about a week ago after she'd been sleeping on her side with her legs over each other.  The "bruises" on the heels also started at that time.  He is continuing to try to reposition her every 2 hours.  Since then he's been putting pillows under her feet and between her legs to keep this from worsening and has been putting antibiotic ointment on the medial leg ulcers.  The boggy excoriated area on her medial thigh near the groin, he's unsure how it occurred.    She has been eating her meals well.  Eggs and bacon and toast and applesauce, then can of soup, full meal at supper.  Is having regular large bms.  Has 16 oz juice or oj, 8oz water, water at lunch, apple juice at dinner (helps with her bms).  Her spirits have also been pretty good.  She has been visiting her husband (who has ALS and lives  with pt's one daughter) and going to church.  There are plans for her to travel with her son to [place] for another son's wedding.     Review of Systems:  Review of Systems  Unable to perform ROS: dementia  see above obtained from her son   Past Medical History  Diagnosis Date  . Unspecified constipation   . Anxiety state, unspecified   . Colitis, enteritis, and gastroenteritis of presumed infectious origin 11/02/2011  . Pneumonia, organism unspecified 11/02/2011  . Cystitis, unspecified   . Impetigo   . Renal artery stenosis   . Dementia in conditions classified elsewhere with behavioral disturbance   . Hypercalcemia   . Reflux esophagitis   . Chest pain, unspecified   . Abnormality of gait   . Hyperosmolality and/or hypernatremia   . Unspecified disorder of liver   . Candidiasis of vulva and vagina   . Alzheimer's disease   . Chest pain, unspecified 08/19/2008  . Dementia in conditions classified elsewhere without behavioral disturbance   . Depressive disorder, not elsewhere classified   . Esophageal reflux   . Other and unspecified hyperlipidemia   . Loss of weight   . Unspecified persistent mental disorders due to conditions classified elsewhere   . Reactive confusion   . Unspecified essential hypertension   .  Other seborrheic keratosis   . Osteoarthrosis, unspecified whether generalized or localized, unspecified site   . Stiffness of joint, not elsewhere classified, unspecified site   . Memory loss 07/24/2007  . Headache(784.0)   . Undiagnosed cardiac murmurs     Past Surgical History  Procedure Laterality Date  . Cesarean section      Social History:   reports that she has never smoked. She has never used smokeless tobacco. She reports that she does not drink alcohol or use illicit drugs.  History reviewed. No pertinent family history.  Medications: Patient's Medications  New Prescriptions   No medications on file  Previous Medications   ALENDRONATE  (FOSAMAX) 70 MG TABLET    Take 1 tablet (70 mg total) by mouth once a week. Take with a full glass of water on an empty stomach.  On Tuesdays   ALPRAZOLAM (XANAX) 0.5 MG TABLET    Take one tablet by mouth three times daily and one tablet by mouth at bedtime as needed for anxiety   DICLOFENAC SODIUM (VOLTAREN) 1 % GEL    Apply 1 application topically 2 (two) times daily. Apply to knees and hips   LOSARTAN (COZAAR) 25 MG TABLET    Take 25 mg by mouth daily.   MEMANTINE (NAMENDA) 10 MG TABLET    Take 10 mg by mouth 2 (two) times daily.   METOPROLOL TARTRATE (LOPRESSOR) 25 MG TABLET    TAKE ONE-HALF (1/2) TABLET TWICE A DAY   NAMENDA 10 MG TABLET    TAKE 1 TABLET TWICE A DAY FOR MEMORY LOSS   NITROGLYCERIN (NITROSTAT) 0.4 MG SL TABLET    Place 0.4 mg under the tongue every 5 (five) minutes as needed for chest pain. Take one tablet sublingual every 5 minutes not to exceed three tablets for chest pain   OMEPRAZOLE (PRILOSEC) 20 MG CAPSULE    TAKE ONE CAPSULE BY MOUTH EVERY DAY FOR REFLUX   ROSUVASTATIN (CRESTOR) 10 MG TABLET    Take 10 mg by mouth daily.   SERTRALINE (ZOLOFT) 100 MG TABLET    Take 100 mg by mouth at bedtime.   Modified Medications   Modified Medication Previous Medication   HYDRALAZINE (APRESOLINE) 50 MG TABLET hydrALAZINE (APRESOLINE) 50 MG tablet      Take 1 tablet (50 mg total) by mouth 3 (three) times daily.    Take 1 tablet (50 mg total) by mouth 3 (three) times daily.   WARFARIN (COUMADIN) 4 MG TABLET warfarin (COUMADIN) 4 MG tablet      TAKE 1 TABLET (4 MG TOTAL) BY MOUTH DAILY.    TAKE 1 TABLET (4 MG TOTAL) BY MOUTH DAILY.  Discontinued Medications   ALENDRONATE (FOSAMAX) 70 MG TABLET    Take 1 tablet (70 mg total) by mouth once a week. Take with a full glass of water on an empty stomach.  On Tuesdays   ENOXAPARIN (LOVENOX) 100 MG/ML INJECTION    Inject 0.9 mLs (90 mg total) into the skin daily.   METOPROLOL TARTRATE (LOPRESSOR) 25 MG TABLET    Take 25 mg by mouth 2 (two) times  daily.    MULTIPLE VITAMIN (MULTIVITAMIN WITH MINERALS) TABS TABLET    Take 1 tablet by mouth daily. Centrum Silver   WARFARIN (COUMADIN) 5 MG TABLET    Take one tablet by mouth once daily for anticoagulation.   WARFARIN (COUMADIN) 6 MG TABLET    Take 1 tablet (6 mg total) by mouth daily.     Physical Exam:  Filed Vitals:   10/30/14 1058  BP: 110/68  Pulse: 94  Temp: 98 F (36.7 C)  TempSrc: Oral  Resp: 10  SpO2: 99%  Physical Exam  Constitutional:  Frail black female on exam table  HENT:  Head: Normocephalic and atraumatic.  Cardiovascular: Normal rate, regular rhythm, normal heart sounds and intact distal pulses.   Pulmonary/Chest: Effort normal and breath sounds normal.  Abdominal: Soft. Bowel sounds are normal. She exhibits no distension and no mass. There is no tenderness.  Musculoskeletal:  Seems to be developing some lower extremity contractures  Neurological: She is alert.  Skin:  Bilateral legs with medial ulcers (unstageable due to eschar) on proximal part of lower legs; deep tissue injury of bilateral heels with some bogginess present; 2cm excoriation on right medial thigh near groin--appears there is fluid subcutaneously in that area, but it disappears when her leg is moved--no tenderness of any of these areas  Psychiatric:  Pt smiling today and laughs occasionally when spoken to    Labs reviewed: Basic Metabolic Panel:  Recent Labs  92/42/6802/12/07 1301  02/17/14 1935  02/19/14 1415 02/20/14 0745 07/27/14 1337  NA 146*  < >  --   < > 153* 145 144  K 4.1  < >  --   < > 3.5* 3.3* 3.9  CL 107  < >  --   < > 115* 109 107  CO2 22  < >  --   < > 22 23 23   GLUCOSE 125*  < >  --   < > 138* 145* 123*  BUN 14  < >  --   < > 10 7 11   CREATININE 0.85  < >  --   < > 0.89 0.81 0.62  CALCIUM 9.9  < >  --   < > 9.4 9.2 9.7  TSH 2.180  --  2.330  --   --   --   --   < > = values in this interval not displayed. Liver Function Tests:  Recent Labs  01/22/14 1301  AST 15    ALT 12  ALKPHOS 71  BILITOT <0.2  PROT 7.6   No results for input(s): LIPASE, AMYLASE in the last 8760 hours.  Recent Labs  02/17/14 1935  AMMONIA 54   CBC:  Recent Labs  01/22/14 1301 02/17/14 1047  07/27/14 1337  07/29/14 0319 07/30/14 0433 07/31/14 0420 08/05/14  WBC 6.8 8.7  < > 8.7  < > 6.5 6.6 6.6 8.0  NEUTROABS 3.4 5.7  --  5.6  --   --   --   --   --   HGB 12.6 13.8  < > 12.2  < > 10.2* 10.2* 10.7* 11.7*  HCT 38.7 45.3  < > 37.7  < > 31.3* 31.5* 33.4* 36  MCV 97 102.7*  < > 92.9  < > 92.1 92.4 92.8  --   PLT 295 139*  < > 285  < > 278 296 303 388  < > = values in this interval not displayed. Lipid Panel: No results for input(s): CHOL, HDL, LDLCALC, TRIG, CHOLHDL, LDLDIRECT in the last 8760 hours. Lab Results  Component Value Date   HGBA1C 6.1* 02/17/2014   Assessment/Plan 1. Pressure ulcer of right leg, unstageable -has eschar over the central area with some visible pink tissue around it, is raised -[facility] to also help with wound care for pt for these new wounds -sacral wound healed  2. Pressure ulcer of left leg, unstageable -same appearance as right -no signs of infection  3. Deep tissue injury -of bilateral heels -prevelon boots recommended for home health to provide for her -also gave her son written instructions about pillow arrangements to float heels  4. Skin excoriation -suspect patient scratched herself here by accident -due to her malnutrition, she has this appearance of subcutaneous fluid  5. Edema -multifactorial, but generalized portion due to #6 with her late stage dementia -also has had bilateral DVTs and now with chronic venous stasis contributing--remains on coumadin for PE and DVT with INR goal 2-3  6. Protein-calorie malnutrition, severe -per her son, she is eating very well at home -advised to try to get her two ensures or boosts per day in addition to her food to up her protein  intake -several sample chocolate bottles were given along with some coupons  7. Alzheimer's disease -is end stage -her son has not accepted this based on our discussion -previous attempts to get hospice have not been successful b/c he has given them the impression that she does very well despite her clinical appearance and skin breakdown I am seeing -hospice is my recommendation for her--it is hard for me to imagine that she is eating 3 meals per day and her skin is in the condition it is in right now -discussed also that travel is going to be extremely disorienting for her and it will take a long time for her to recover when they return from [place] (will be extremely fatigued)  Labs/tests ordered:  Should reassess prealbumin next time Next appt: 02/01/15  Lamira Borin L. Yuliya Nova, D.O. Geriatrics [facility] [address], [place] [address] Cell Phone (Mon-Fri 8am-5pm):  [phone] On Call:  [phone] & follow prompts after 5pm & weekends Office Phone:  [phone] Office Fax:  [phone]

## 2014-10-30 NOTE — Patient Instructions (Addendum)
1) Please try to give mom 2 chocolate protein drinks per day.  I gave you several samples and some coupons.  2) Mom has pressure ulcers on the inside of her legs.  Please continue to put a body pillow in between her legs.  Also, she has deep tissue injury in both heels.  I will [facility] to help with her heels breakdown.  In the meantime, float her heels with a pillow behind her lower legs.  Continue to move her every 2 hours at least to help prevent skin breakdown.    3)  Continue to try to keep her skin as moist as possible with lotion.    4)  Let me know if she has difficulty with her intake of food or drink.

## 2014-11-07 ENCOUNTER — Other Ambulatory Visit: Payer: Self-pay | Admitting: Internal Medicine

## 2014-11-09 ENCOUNTER — Other Ambulatory Visit: Payer: Self-pay | Admitting: Internal Medicine

## 2014-11-19 ENCOUNTER — Ambulatory Visit: Payer: Medicare Other | Admitting: Podiatrist

## 2014-11-19 ENCOUNTER — Ambulatory Visit (INDEPENDENT_AMBULATORY_CARE_PROVIDER_SITE_OTHER): Payer: Medicare Other | Admitting: Internal Medicine

## 2014-11-19 ENCOUNTER — Encounter: Payer: Self-pay | Admitting: Internal Medicine

## 2014-11-19 VITALS — BP 118/66 | HR 104 | Temp 97.7°F | Resp 10

## 2014-11-19 DIAGNOSIS — I82409 Acute embolism and thrombosis of unspecified deep veins of unspecified lower extremity: Secondary | ICD-10-CM

## 2014-11-19 DIAGNOSIS — E43 Unspecified severe protein-calorie malnutrition: Secondary | ICD-10-CM

## 2014-11-19 DIAGNOSIS — L89890 Pressure ulcer of other site, unstageable: Secondary | ICD-10-CM

## 2014-11-19 DIAGNOSIS — R609 Edema, unspecified: Secondary | ICD-10-CM

## 2014-11-19 DIAGNOSIS — G309 Alzheimer's disease, unspecified: Secondary | ICD-10-CM

## 2014-11-19 DIAGNOSIS — Z5181 Encounter for therapeutic drug level monitoring: Secondary | ICD-10-CM

## 2014-11-19 DIAGNOSIS — F0280 Dementia in other diseases classified elsewhere without behavioral disturbance: Secondary | ICD-10-CM

## 2014-11-19 LAB — POCT INR: INR: 2.3

## 2014-11-19 NOTE — Patient Instructions (Signed)
INR was 2.3 today Continue current coumadin and recheck INR in 2 weeks Air mattress script was given to you and boost samples

## 2014-11-19 NOTE — Progress Notes (Signed)
Patient ID: Sabrina Long, female   DOB: Sep 16, 1936, 79 y.o.   MRN: [id number]   Location:  [facility]  Code Status: full code  Allergies  Allergen Reactions  . Risperdal [Risperidone] Other (See Comments)    sedation    Chief Complaint  Patient presents with  . Medical Management of Chronic Issues    3 month follow-up, no recent labs (not fasting). PT/INR Check , ongoing left hand/arm swelling , and request blood sugar level checks  . Results    Discuss genetic test results   . Constipation    No bowel movement x 2 days, held apple juice for a couple days due explosive bowels    HPI: Patient is a 79 y.o. black female seen in the office today for med mgt chronic diseases.  [name] did not come back out to the home when we requested it at last visit for wound care.    Wounds are healing with eating, turning and floating heels, separating legs with pillows, and boost supplements daily.  Intake has been good.  Has not been able to do more than one boost or she doesn't finish her supper.    INR was done today and it was 2.3.  Review of Systems: what can obtain from son and daughter in law Review of Systems  Constitutional: Positive for malaise/fatigue. Negative for fever.  Gastrointestinal: Positive for constipation. Negative for blood in stool and melena.       Using apple juice and colace just purchased  Musculoskeletal: Negative for falls.  Skin: Negative for rash.       Pressure ulcers  Neurological: Positive for weakness.  Psychiatric/Behavioral: Positive for memory loss.     Past Medical History  Diagnosis Date  . Unspecified constipation   . Anxiety state, unspecified   . Colitis, enteritis, and gastroenteritis of presumed infectious origin 11/02/2011  . Pneumonia, organism unspecified 11/02/2011  . Cystitis, unspecified   . Impetigo   . Renal artery stenosis   . Dementia in conditions classified elsewhere with  behavioral disturbance   . Hypercalcemia   . Reflux esophagitis   . Chest pain, unspecified   . Abnormality of gait   . Hyperosmolality and/or hypernatremia   . Unspecified disorder of liver   . Candidiasis of vulva and vagina   . Alzheimer's disease   . Chest pain, unspecified 08/19/2008  . Dementia in conditions classified elsewhere without behavioral disturbance   . Depressive disorder, not elsewhere classified   . Esophageal reflux   . Other and unspecified hyperlipidemia   . Loss of weight   . Unspecified persistent mental disorders due to conditions classified elsewhere   . Reactive confusion   . Unspecified essential hypertension   . Other seborrheic keratosis   . Osteoarthrosis, unspecified whether generalized or localized, unspecified site   . Stiffness of joint, not elsewhere classified, unspecified site   . Memory loss 07/24/2007  . Headache(784.0)   . Undiagnosed cardiac murmurs     Past Surgical History  Procedure Laterality Date  . Cesarean section      Social History:   reports that she has never smoked. She has never used smokeless tobacco. She reports that she does not drink alcohol or use illicit drugs.  No family history on file.  Medications: Patient's Medications  New Prescriptions   No medications on file  Previous Medications   ALENDRONATE (FOSAMAX) 70 MG TABLET    TAKE 1 TABLET ONCE A  WEEK ON TUESDAYS. TAKE WITH A FULL GLASS OF WATER ON AN EMPTY STOMACH.   ALPRAZOLAM (XANAX) 0.5 MG TABLET    Take one tablet by mouth three times daily and one tablet by mouth at bedtime as needed for anxiety   DICLOFENAC SODIUM (VOLTAREN) 1 % GEL    Apply 1 application topically 2 (two) times daily. Apply to knees and hips   HYDRALAZINE (APRESOLINE) 50 MG TABLET    Take 1 tablet (50 mg total) by mouth 3 (three) times daily.   LOSARTAN (COZAAR) 25 MG TABLET    TAKE 1 TABLET DAILY FOR BLOOD PRESSURE   METOPROLOL TARTRATE (LOPRESSOR) 25 MG TABLET    TAKE ONE-HALF (1/2)  TABLET TWICE A DAY   NAMENDA 10 MG TABLET    TAKE 1 TABLET TWICE A DAY FOR MEMORY LOSS   NITROGLYCERIN (NITROSTAT) 0.4 MG SL TABLET    Place 0.4 mg under the tongue every 5 (five) minutes as needed for chest pain. Take one tablet sublingual every 5 minutes not to exceed three tablets for chest pain   OMEPRAZOLE (PRILOSEC) 20 MG CAPSULE    TAKE ONE CAPSULE BY MOUTH EVERY DAY FOR REFLUX   ROSUVASTATIN (CRESTOR) 10 MG TABLET    Take 10 mg by mouth daily.   WARFARIN (COUMADIN) 4 MG TABLET    TAKE 1 TABLET (4 MG TOTAL) BY MOUTH DAILY.  Modified Medications   No medications on file  Discontinued Medications   LOSARTAN (COZAAR) 25 MG TABLET    Take 25 mg by mouth daily.   MEMANTINE (NAMENDA) 10 MG TABLET    Take 10 mg by mouth 2 (two) times daily.   SERTRALINE (ZOLOFT) 100 MG TABLET    Take 100 mg by mouth at bedtime.      Physical Exam: Filed Vitals:   11/19/14 1111  BP: 118/66  Pulse: 104  Temp: 97.7 F (36.5 C)  TempSrc: Oral  Resp: 10  SpO2: 95%  Physical Exam  Constitutional: No distress.  Cardiovascular: Normal rate, regular rhythm, normal heart sounds and intact distal pulses.   Pulmonary/Chest: Effort normal and breath sounds normal. No respiratory distress.  Abdominal: Soft. Bowel sounds are normal. She exhibits no distension. There is no tenderness.  Neurological: She is alert.  Skin:  Left leg wound has healed; no more deep tissue injury visible on heels; does have remaining unstageable pressure ulcer of left medial leg with scaling and eschar present    Labs reviewed: Basic Metabolic Panel:  Recent Labs  16/10/96 1301  02/17/14 1935  02/19/14 1415 02/20/14 0745 07/27/14 1337  NA 146*  < >  --   < > 153* 145 144  K 4.1  < >  --   < > 3.5* 3.3* 3.9  CL 107  < >  --   < > 115* 109 107  CO2 22  < >  --   < > GLUCOSE 125*  < >  --   < > 138* 145* 123*  BUN 14  < >  --   < > CREATININE 0.85  < >  --   < > 0.89 0.81 0.62  CALCIUM 9.9  < >  --   < >  9.4 9.2 9.7  TSH 2.180  --  2.330  --   --   --   --   < > = values in this interval not displayed. Liver Function Tests:  Recent Labs  01/22/14 1301  AST 15  ALT 12  ALKPHOS 71  BILITOT <0.2  PROT 7.6   No results for input(s): LIPASE, AMYLASE in the last 8760 hours.  Recent Labs  02/17/14 1935  AMMONIA 54   CBC:  Recent Labs  01/22/14 1301 02/17/14 1047  07/27/14 1337  07/29/14 0319 07/30/14 0433 07/31/14 0420 08/05/14  WBC 6.8 8.7  < > 8.7  < > 6.5 6.6 6.6 8.0  NEUTROABS 3.4 5.7  --  5.6  --   --   --   --   --   HGB 12.6 13.8  < > 12.2  < > 10.2* 10.2* 10.7* 11.7*  HCT 38.7 45.3  < > 37.7  < > 31.3* 31.5* 33.4* 36  MCV 97 102.7*  < > 92.9  < > 92.1 92.4 92.8  --   PLT 295 139*  < > 285  < > 278 296 303 388  < > = values in this interval not displayed. Lipid Panel: No results for input(s): CHOL, HDL, LDLCALC, TRIG, CHOLHDL, LDLDIRECT in the last 8760 hours. Lab Results  Component Value Date   HGBA1C 6.1* 02/17/2014    Assessment/Plan 1. DVT of leg (deep venous thrombosis), unspecified laterality - cont coumadin at current dose as INR done today was 2.3  - [facility] to recheck in 2 wks - POC INR  2. Pressure ulcer of right leg, unstageable -[facility] to call her son to come out to help with management, assist with getting air mattress and with prevalon boots  3. Edema -bilateral, but worse in right hand, seems dependent and due to malnutrition from end stage dementia  4. Protein-calorie malnutrition, severe -cont boost one daily  5. Alzheimer's disease -end stage, conts namenda  6. Encounter for therapeutic drug monitoring -discussed results of genetic testing, no longer on zoloft so changes not needed  Labs/tests ordered:  No new except INR 2 wks by [facility] Next appt:  3 mos  Zerah Hilyer L. Rosanne Wohlfarth, D.O. Geriatrics [facility] [address], [place] [address] Cell Phone (Mon-Fri 8am-5pm):  [phone] On Call:   [phone] & follow prompts after 5pm & weekends Office Phone:  [phone] Office Fax:  [phone]

## 2014-11-21 ENCOUNTER — Other Ambulatory Visit: Payer: Self-pay | Admitting: Internal Medicine

## 2014-11-27 ENCOUNTER — Encounter: Payer: Self-pay | Admitting: Internal Medicine

## 2014-12-10 ENCOUNTER — Ambulatory Visit: Payer: Medicare Other | Admitting: Podiatrist

## 2014-12-12 ENCOUNTER — Other Ambulatory Visit: Payer: Self-pay | Admitting: Internal Medicine

## 2014-12-18 ENCOUNTER — Encounter: Payer: Self-pay | Admitting: Internal Medicine

## 2014-12-18 ENCOUNTER — Other Ambulatory Visit: Payer: Self-pay | Admitting: *Deleted

## 2014-12-18 ENCOUNTER — Ambulatory Visit (INDEPENDENT_AMBULATORY_CARE_PROVIDER_SITE_OTHER): Payer: Medicare Other | Admitting: Internal Medicine

## 2014-12-18 VITALS — BP 132/82 | HR 126 | Temp 98.0°F | Resp 20

## 2014-12-18 DIAGNOSIS — L89150 Pressure ulcer of sacral region, unstageable: Secondary | ICD-10-CM | POA: Diagnosis not present

## 2014-12-18 DIAGNOSIS — I82402 Acute embolism and thrombosis of unspecified deep veins of left lower extremity: Secondary | ICD-10-CM

## 2014-12-18 DIAGNOSIS — T07 Unspecified multiple injuries: Secondary | ICD-10-CM

## 2014-12-18 DIAGNOSIS — E43 Unspecified severe protein-calorie malnutrition: Secondary | ICD-10-CM

## 2014-12-18 DIAGNOSIS — T798XXA Other early complications of trauma, initial encounter: Secondary | ICD-10-CM | POA: Diagnosis not present

## 2014-12-18 DIAGNOSIS — G309 Alzheimer's disease, unspecified: Secondary | ICD-10-CM

## 2014-12-18 DIAGNOSIS — L89510 Pressure ulcer of right ankle, unstageable: Secondary | ICD-10-CM | POA: Diagnosis not present

## 2014-12-18 DIAGNOSIS — L89221 Pressure ulcer of left hip, stage 1: Secondary | ICD-10-CM | POA: Diagnosis not present

## 2014-12-18 DIAGNOSIS — F0280 Dementia in other diseases classified elsewhere without behavioral disturbance: Secondary | ICD-10-CM

## 2014-12-18 DIAGNOSIS — T07XXXA Unspecified multiple injuries, initial encounter: Secondary | ICD-10-CM

## 2014-12-18 MED ORDER — DOXYCYCLINE HYCLATE 100 MG PO TABS: 100.0000 mg | ORAL_TABLET | Freq: Two times a day (BID) | ORAL | Status: DC

## 2014-12-18 NOTE — Patient Instructions (Signed)
duoderm to sacrum and change weekly unless soiled.  Keep clean using saline solution to sacrum and dry. May clean right ankle with warm antibacterial soapy water, blot dry and apply nonstick pad daily.  Turn patient every 2 hours and continue use of pillows to reduce pressure.   Apply neosporin to right and left ears.  [facility] to reassess wounds. Wound clinic referral pending

## 2014-12-18 NOTE — Progress Notes (Signed)
Patient ID: Sabrina Long, female   DOB: 08/16/1936, 79 y.o.   MRN: [id number]    Facility  [facility]    Place of Service:   OFFICE   Allergies  Allergen Reactions  . Risperdal [Risperidone] Other (See Comments)    sedation    Chief Complaint  Patient presents with  . Acute Visit    wounds on rt inner leg, lt ankle, both hips, ears,butt bone.    HPI:  79 yo female seen today for above. [facility] and wounds were being watched carefully up until 2 weeks ago [facility]. Needs referral to wound center. Per son, pt's appetite is excellent and no issues with bowel/bladder habits. BS at home in 70-80s most days. No low BS reactions. He noticed tailbone pressure sore yesterday. It began as a blister. Only right lateral malleoli reddish d/c noted. He repositions her q 2hrs at home and attempts to reduce pressure areas using pillows. She sleeps on a gel overlay mattress. Has used air mattress in past when she had sacral ulcers but when they healed, insurance would no longer pay for it and it was removed. No f/c, N/V, abdominal pain. (+) weight loss of about 6 lbs in last several weeks despite adequate po intake and protein shakes.  She is sitting in w/c wearing a bath robe Medications: Patient's Medications  New Prescriptions   No medications on file  Previous Medications   ALENDRONATE (FOSAMAX) 70 MG TABLET    TAKE 1 TABLET ONCE A WEEK ON TUESDAYS. TAKE WITH A FULL GLASS OF WATER ON AN EMPTY STOMACH.   ALPRAZOLAM (XANAX) 0.5 MG TABLET    Take one tablet by mouth three times daily and one tablet by mouth at bedtime as needed for anxiety   DICLOFENAC SODIUM (VOLTAREN) 1 % GEL    Apply 1 application topically 2 (two) times daily. Apply to knees and hips   HYDRALAZINE (APRESOLINE) 50 MG TABLET    Take 1 tablet (50 mg total) by mouth 3 (three) times daily.   HYDRALAZINE (APRESOLINE) 50 MG TABLET    TAKE 1 TABLET (50 MG TOTAL) BY MOUTH 3 (THREE) TIMES  DAILY.   LOSARTAN (COZAAR) 25 MG TABLET    TAKE 1 TABLET DAILY FOR BLOOD PRESSURE   METOPROLOL TARTRATE (LOPRESSOR) 25 MG TABLET    TAKE ONE-HALF (1/2) TABLET TWICE A DAY   NAMENDA 10 MG TABLET    TAKE 1 TABLET TWICE A DAY FOR MEMORY LOSS   NITROGLYCERIN (NITROSTAT) 0.4 MG SL TABLET    Place 0.4 mg under the tongue every 5 (five) minutes as needed for chest pain. Take one tablet sublingual every 5 minutes not to exceed three tablets for chest pain   OMEPRAZOLE (PRILOSEC) 20 MG CAPSULE    TAKE ONE CAPSULE BY MOUTH EVERY DAY FOR REFLUX   ROSUVASTATIN (CRESTOR) 10 MG TABLET    Take 10 mg by mouth daily.   WARFARIN (COUMADIN) 4 MG TABLET    TAKE 1 TABLET (4 MG TOTAL) BY MOUTH DAILY.  Modified Medications   No medications on file  Discontinued Medications   No medications on file     Review of Systems  As above. Unable to obtain full ROS due to mental status.  Filed Vitals:   12/18/14 0917  BP: 132/82  Pulse: 126  Temp: 98 F (36.7 C)  TempSrc: Oral  Resp: 20  SpO2: 97%   There is no weight on file to  calculate BMI.    Physical Exam  Constitutional: She is uncooperative. She has a sickly appearance. No distress.  HENT:  Right Ear: There is swelling and tenderness. No drainage.  Ears:  Mouth/Throat: Oropharynx is clear and moist.  Eyes: Pupils are equal, round, and reactive to light.  Neck: Neck supple.  Cardiovascular: Regular rhythm, normal heart sounds and normal pulses.   No extrasystoles are present. Tachycardia present.   Pulmonary/Chest: Effort normal. She has no wheezes. She has no rhonchi. She has no rales.  Musculoskeletal:  Contracture of LE noted  Neurological: She is alert.  Skin:         Labs reviewed: Office Visit on 11/19/2014  Component Date Value Ref Range Status  . INR 11/19/2014 2.3   Final  Telephone on 10/09/2014  Component Date Value Ref Range Status  . INR 10/09/2014 2.1* 0.9 - 1.1 Final  Telephone on 10/05/2014  Component Date Value Ref  Range Status  . INR 10/05/2014 2.0* 0.9 - 1.1 Final  Telephone on 09/28/2014  Component Date Value Ref Range Status  . INR 09/28/2014 2.3* 0.9 - 1.1 Final  Abstract on 09/25/2014  Component Date Value Ref Range Status  . INR 09/25/2014 1.9* 0.9 - 1.1 Final  Telephone on 09/23/2014  Component Date Value Ref Range Status  . INR 09/23/2014 2.0* 0.9 - 1.1 Final     Assessment/Plan   ICD-9-CM ICD-10-CM   1. Multiple open wounds 879.8 T07 Ambulatory referral to Wound Clinic   sacral (unstageable) 2 cm x 2 cm; right lateral malleolus (unstageable)  2. Infected wound, initial encounter 958.3 T79.8XXA CBC with Differential     CMP     CT Pelvis W Contrast     doxycycline (VIBRA-TABS) 100 MG tablet   sacral and right ankle  3. Pressure sore of hip, left, stage I 707.04 L89.221    707.21    4. Pressure sore on sacrum, unstageable 707.03 L89.150 CT Pelvis W Contrast   707.25    5. Pressure ulcer, ankle, right, unstageable 707.06 L89.510    707.25    6. Protein-calorie malnutrition, severe 262 E43 CMP  7. Alzheimer's disease 331.0 G30.9 CMP   --she has new wounds as above. Obtain HH eval. Refer to wound clinic. duoderm placed on sacrum today.  --cleanse sacral wound with saline and blot dry. Change duoderm weekly unless soiled.  --check labs today, including INR  --will call with results  --may need surgery eval  --RTO in 1-2 weeks to reck.    Oseph Imburgia S. Ancil Linsey  [facility] [address] [place], [place] [address] [phone] Office (Wednesdays and Fridays 8 AM - 5 PM) [phone] Cell (Monday-Friday 8 AM - 5 PM)

## 2014-12-19 LAB — COMPREHENSIVE METABOLIC PANEL
A/G RATIO: 1 — AB (ref 1.1–2.5)
ALT: 9 IU/L (ref 0–32)
AST: 21 IU/L (ref 0–40)
Albumin: 3.1 g/dL — ABNORMAL LOW (ref 3.5–4.8)
Alkaline Phosphatase: 84 IU/L (ref 39–117)
BUN/Creatinine Ratio: 21 (ref 11–26)
BUN: 12 mg/dL (ref 8–27)
Bilirubin Total: <0.2 mg/dL (ref 0.0–1.2)
CO2: 20 mmol/L (ref 18–29)
Calcium: 9.6 mg/dL (ref 8.7–10.3)
Chloride: 106 mmol/L (ref 97–108)
Creatinine, Ser: 0.57 mg/dL (ref 0.57–1.00)
GFR calc Af Amer: 104 mL/min/{1.73_m2} (ref >59)
GFR calc non Af Amer: 90 mL/min/{1.73_m2} (ref >59)
GLUCOSE: 206 mg/dL — AB (ref 65–99)
Globulin, Total: 3.1 g/dL (ref 1.5–4.5)
Potassium: 3.8 mmol/L (ref 3.5–5.2)
Sodium: 143 mmol/L (ref 134–144)
Total Protein: 6.2 g/dL (ref 6.0–8.5)

## 2014-12-19 LAB — CBC WITH DIFFERENTIAL/PLATELET
Basophils Absolute: 0.1 10*3/uL (ref 0.0–0.2)
Basos: 1 %
EOS: 0 %
Eosinophils Absolute: 0 10*3/uL (ref 0.0–0.4)
HCT: 36 % (ref 34.0–46.6)
Hemoglobin: 11.5 g/dL (ref 11.1–15.9)
Immature Grans (Abs): 0 10*3/uL (ref 0.0–0.1)
Immature Granulocytes: 0 %
Lymphocytes Absolute: 2.2 10*3/uL (ref 0.7–3.1)
Lymphs: 36 %
MCH: 29.9 pg (ref 26.6–33.0)
MCHC: 31.9 g/dL (ref 31.5–35.7)
MCV: 94 fL (ref 79–97)
Monocytes Absolute: 0.3 10*3/uL (ref 0.1–0.9)
Monocytes: 5 %
NEUTROS ABS: 3.5 10*3/uL (ref 1.4–7.0)
NEUTROS PCT: 58 %
PLATELETS: 415 10*3/uL — AB (ref 150–379)
RBC: 3.84 x10E6/uL (ref 3.77–5.28)
RDW: 15.5 % — AB (ref 12.3–15.4)
WBC: 6.1 10*3/uL (ref 3.4–10.8)

## 2014-12-19 LAB — PROTIME-INR
INR: 1.6 — ABNORMAL HIGH (ref 0.8–1.2)
PROTHROMBIN TIME: 16.3 s — AB (ref 9.1–12.0)

## 2014-12-21 DIAGNOSIS — I10 Essential (primary) hypertension: Secondary | ICD-10-CM

## 2014-12-21 DIAGNOSIS — L89892 Pressure ulcer of other site, stage 2: Secondary | ICD-10-CM

## 2014-12-21 DIAGNOSIS — L89153 Pressure ulcer of sacral region, stage 3: Secondary | ICD-10-CM

## 2014-12-21 DIAGNOSIS — L89503 Pressure ulcer of unspecified ankle, stage 3: Secondary | ICD-10-CM

## 2014-12-21 LAB — PROTIME-INR

## 2014-12-22 ENCOUNTER — Telehealth: Payer: Self-pay | Admitting: *Deleted

## 2014-12-22 NOTE — Telephone Encounter (Signed)
[facility] 12/21/2014  PT:  18.1 INR:  1.50  Current Dose of Coumadin: 4mg  daily. No missed doses and No bleeding per son. Given results to Dr. Glade LloydPandey to review and sign.

## 2014-12-22 NOTE — Telephone Encounter (Signed)
Per Dr. Glade LloydPandey: Change Coumadin to 5mg  by mouth daily and recheck in 1 week. Patient son Notified and called Herbert SetaHeather with [facility] # [phone]

## 2014-12-23 ENCOUNTER — Ambulatory Visit
Admission: RE | Admit: 2014-12-23 | Discharge: 2014-12-23 | Disposition: A | Discharge status: Home / Self Care | Payer: Medicare Other | Source: Ambulatory Visit | Attending: Internal Medicine | Admitting: Internal Medicine

## 2014-12-23 DIAGNOSIS — L89150 Pressure ulcer of sacral region, unstageable: Secondary | ICD-10-CM

## 2014-12-23 DIAGNOSIS — T798XXA Other early complications of trauma, initial encounter: Secondary | ICD-10-CM

## 2014-12-23 MED ORDER — IOPAMIDOL (ISOVUE-300) INJECTION 61%
100.0000 mL | Freq: Once | INTRAVENOUS | Status: AC | PRN
  Administered 2014-12-23: 100 mL via INTRAVENOUS

## 2014-12-24 ENCOUNTER — Telehealth: Payer: Self-pay | Admitting: *Deleted

## 2014-12-24 ENCOUNTER — Encounter: Payer: Self-pay | Admitting: Podiatrist

## 2014-12-24 ENCOUNTER — Ambulatory Visit (INDEPENDENT_AMBULATORY_CARE_PROVIDER_SITE_OTHER): Payer: Medicare Other | Admitting: Podiatrist

## 2014-12-24 DIAGNOSIS — M79676 Pain in unspecified toe(s): Secondary | ICD-10-CM

## 2014-12-24 DIAGNOSIS — B351 Tinea unguium: Secondary | ICD-10-CM

## 2014-12-24 NOTE — Telephone Encounter (Signed)
Sabrina Long, son called and stated that he would like a Rx for a Air Mattress due to patient's wounds. Would like to pick up one today to [facility] to [facility]. Please Advise.

## 2014-12-24 NOTE — Progress Notes (Signed)
HPI:  Patient presents today for follow up of foot and nail care. Denies any new complaints today. Patient presents in a wheelchair and relates pain in the toenails.  Objective:  Patients chart is reviewed.  Vascular status reveals pedal pulses noted at 1 out of 4 dp and 0/4 pt bilateral .  Neurological sensation is intact to the toenails. Patients nails are severely thickened, discolored, distrophic, friable and brittle with yellow-brown discoloration. Patient subjectively relates they are painful with shoes and with ambulation of bilateral feet. No open lesions on the left foot noted.  Right foot has a dressing present which is not removed at today's visit.   Assessment:  Symptomatic onychomycosis,   Plan:  Discussed treatment options and alternatives.  The symptomatic toenails were debrided through manual an mechanical means without complication.  Return appointment recommended at routine intervals of 3 months

## 2014-12-25 MED ORDER — AMBULATORY NON FORMULARY MEDICATION: Status: DC

## 2014-12-25 NOTE — Telephone Encounter (Signed)
Ok for air mattress due to pt's sacral ulcer

## 2014-12-25 NOTE — Telephone Encounter (Signed)
Patient son, [name] will pick up. Notified.

## 2014-12-28 LAB — PROTIME-INR

## 2014-12-30 ENCOUNTER — Telehealth: Payer: Self-pay | Admitting: *Deleted

## 2014-12-30 NOTE — Telephone Encounter (Signed)
Called[facility]LMOM) [name] [facility] to give her the final results to the PT-INR for the patient - continue current dose of coumadin, recheck in 2 weeks.

## 2014-12-31 ENCOUNTER — Encounter: Payer: Self-pay | Admitting: Internal Medicine

## 2014-12-31 ENCOUNTER — Encounter (HOSPITAL_BASED_OUTPATIENT_CLINIC_OR_DEPARTMENT_OTHER): Payer: Medicare Other | Attending: Internal Medicine

## 2014-12-31 DIAGNOSIS — F0280 Dementia in other diseases classified elsewhere without behavioral disturbance: Secondary | ICD-10-CM | POA: Diagnosis not present

## 2014-12-31 DIAGNOSIS — L89522 Pressure ulcer of left ankle, stage 2: Secondary | ICD-10-CM | POA: Insufficient documentation

## 2014-12-31 DIAGNOSIS — L89510 Pressure ulcer of right ankle, unstageable: Secondary | ICD-10-CM | POA: Diagnosis present

## 2014-12-31 DIAGNOSIS — B964 Proteus (mirabilis) (morganii) as the cause of diseases classified elsewhere: Secondary | ICD-10-CM | POA: Diagnosis not present

## 2014-12-31 DIAGNOSIS — L89153 Pressure ulcer of sacral region, stage 3: Secondary | ICD-10-CM | POA: Diagnosis present

## 2014-12-31 DIAGNOSIS — L98499 Non-pressure chronic ulcer of skin of other sites with unspecified severity: Secondary | ICD-10-CM | POA: Diagnosis not present

## 2014-12-31 DIAGNOSIS — G309 Alzheimer's disease, unspecified: Secondary | ICD-10-CM | POA: Diagnosis not present

## 2014-12-31 DIAGNOSIS — L89224 Pressure ulcer of left hip, stage 4: Secondary | ICD-10-CM | POA: Diagnosis present

## 2015-01-07 DIAGNOSIS — L89510 Pressure ulcer of right ankle, unstageable: Secondary | ICD-10-CM | POA: Diagnosis not present

## 2015-01-07 DIAGNOSIS — L89224 Pressure ulcer of left hip, stage 4: Secondary | ICD-10-CM | POA: Diagnosis not present

## 2015-01-07 DIAGNOSIS — L98499 Non-pressure chronic ulcer of skin of other sites with unspecified severity: Secondary | ICD-10-CM | POA: Diagnosis not present

## 2015-01-07 DIAGNOSIS — L89153 Pressure ulcer of sacral region, stage 3: Secondary | ICD-10-CM | POA: Diagnosis not present

## 2015-01-08 ENCOUNTER — Encounter: Payer: Self-pay | Admitting: Internal Medicine

## 2015-01-13 ENCOUNTER — Other Ambulatory Visit: Payer: Self-pay | Admitting: Nurse Practitioner

## 2015-01-13 ENCOUNTER — Telehealth: Payer: Self-pay

## 2015-01-13 MED ORDER — ALPRAZOLAM 0.5 MG PO TABS: ORAL_TABLET | ORAL | Status: DC

## 2015-01-13 NOTE — Telephone Encounter (Signed)
Rx was reprinted several times due to computer error

## 2015-01-14 DIAGNOSIS — L89153 Pressure ulcer of sacral region, stage 3: Secondary | ICD-10-CM | POA: Diagnosis not present

## 2015-01-14 DIAGNOSIS — L89224 Pressure ulcer of left hip, stage 4: Secondary | ICD-10-CM | POA: Diagnosis not present

## 2015-01-14 DIAGNOSIS — L98499 Non-pressure chronic ulcer of skin of other sites with unspecified severity: Secondary | ICD-10-CM | POA: Diagnosis not present

## 2015-01-14 DIAGNOSIS — L89510 Pressure ulcer of right ankle, unstageable: Secondary | ICD-10-CM | POA: Diagnosis not present

## 2015-01-25 ENCOUNTER — Telehealth: Payer: Self-pay | Admitting: *Deleted

## 2015-01-25 LAB — POCT INR: INR: 1.6 — AB (ref 0.9–1.1)

## 2015-01-25 NOTE — Telephone Encounter (Signed)
Keep dose at coumadin 4mg  daily and recheck in 1 week.

## 2015-01-25 NOTE — Telephone Encounter (Signed)
Kim with [name] called with Protime results 01/25/2015  INR:  1.6  Per Selena BattenKim patient has had greens and Broccoli this week and has forgot to take a dose of Coumadin. Current dose of Coumadin is 4mg  daily.

## 2015-01-26 NOTE — Telephone Encounter (Signed)
Patient daughter and Selena BattenKim with [name] Notified and agreed.

## 2015-01-28 ENCOUNTER — Encounter (HOSPITAL_BASED_OUTPATIENT_CLINIC_OR_DEPARTMENT_OTHER): Payer: Medicare Other | Attending: Internal Medicine

## 2015-01-28 DIAGNOSIS — L89153 Pressure ulcer of sacral region, stage 3: Secondary | ICD-10-CM | POA: Diagnosis not present

## 2015-01-28 DIAGNOSIS — L89512 Pressure ulcer of right ankle, stage 2: Secondary | ICD-10-CM | POA: Diagnosis not present

## 2015-01-28 DIAGNOSIS — L89224 Pressure ulcer of left hip, stage 4: Secondary | ICD-10-CM | POA: Diagnosis not present

## 2015-02-01 ENCOUNTER — Telehealth: Payer: Self-pay

## 2015-02-01 ENCOUNTER — Ambulatory Visit: Payer: Medicare Other | Admitting: Internal Medicine

## 2015-02-01 ENCOUNTER — Encounter: Payer: Self-pay | Admitting: Internal Medicine

## 2015-02-01 NOTE — Telephone Encounter (Signed)
Cont current dose and recheck INR in 1 week and call to us.

## 2015-02-01 NOTE — Telephone Encounter (Signed)
Lynnae JanuaryKim- [facility] aware of Dr.Reed's instructions. Patients son Arlys John([name]) also aware of Dr.Reed's instructions

## 2015-02-01 NOTE — Telephone Encounter (Signed)
Current PT/INR: 1.8/20.1 Current dose: 5 mg daily No missed doses  Please advise

## 2015-02-02 ENCOUNTER — Other Ambulatory Visit: Payer: Self-pay | Admitting: Internal Medicine

## 2015-02-11 DIAGNOSIS — L89153 Pressure ulcer of sacral region, stage 3: Secondary | ICD-10-CM | POA: Diagnosis not present

## 2015-02-11 DIAGNOSIS — L89224 Pressure ulcer of left hip, stage 4: Secondary | ICD-10-CM | POA: Diagnosis not present

## 2015-02-11 DIAGNOSIS — L89512 Pressure ulcer of right ankle, stage 2: Secondary | ICD-10-CM | POA: Diagnosis not present

## 2015-02-15 ENCOUNTER — Inpatient Hospital Stay (HOSPITAL_COMMUNITY)
Admission: EM | Admit: 2015-02-15 | Discharge: 2015-02-18 | DRG: 100 | Disposition: A | Discharge status: Home Health | Payer: Medicare Other | Attending: Internal Medicine | Admitting: Internal Medicine

## 2015-02-15 ENCOUNTER — Emergency Department (HOSPITAL_COMMUNITY): Payer: Medicare Other

## 2015-02-15 DIAGNOSIS — L89224 Pressure ulcer of left hip, stage 4: Secondary | ICD-10-CM | POA: Diagnosis present

## 2015-02-15 DIAGNOSIS — F419 Anxiety disorder, unspecified: Secondary | ICD-10-CM | POA: Diagnosis present

## 2015-02-15 DIAGNOSIS — R339 Retention of urine, unspecified: Secondary | ICD-10-CM | POA: Diagnosis present

## 2015-02-15 DIAGNOSIS — L89512 Pressure ulcer of right ankle, stage 2: Secondary | ICD-10-CM | POA: Diagnosis present

## 2015-02-15 DIAGNOSIS — D649 Anemia, unspecified: Secondary | ICD-10-CM | POA: Diagnosis present

## 2015-02-15 DIAGNOSIS — E43 Unspecified severe protein-calorie malnutrition: Secondary | ICD-10-CM | POA: Insufficient documentation

## 2015-02-15 DIAGNOSIS — G309 Alzheimer's disease, unspecified: Secondary | ICD-10-CM | POA: Diagnosis not present

## 2015-02-15 DIAGNOSIS — R000 Tachycardia, unspecified: Secondary | ICD-10-CM | POA: Diagnosis not present

## 2015-02-15 DIAGNOSIS — F0281 Dementia in other diseases classified elsewhere with behavioral disturbance: Secondary | ICD-10-CM | POA: Diagnosis present

## 2015-02-15 DIAGNOSIS — G40901 Epilepsy, unspecified, not intractable, with status epilepticus: Secondary | ICD-10-CM

## 2015-02-15 DIAGNOSIS — Z66 Do not resuscitate: Secondary | ICD-10-CM | POA: Diagnosis not present

## 2015-02-15 DIAGNOSIS — Z7189 Other specified counseling: Secondary | ICD-10-CM

## 2015-02-15 DIAGNOSIS — T465X5A Adverse effect of other antihypertensive drugs, initial encounter: Secondary | ICD-10-CM | POA: Diagnosis not present

## 2015-02-15 DIAGNOSIS — I10 Essential (primary) hypertension: Secondary | ICD-10-CM | POA: Diagnosis present

## 2015-02-15 DIAGNOSIS — Z7401 Bed confinement status: Secondary | ICD-10-CM

## 2015-02-15 DIAGNOSIS — G40301 Generalized idiopathic epilepsy and epileptic syndromes, not intractable, with status epilepticus: Secondary | ICD-10-CM | POA: Diagnosis not present

## 2015-02-15 DIAGNOSIS — E876 Hypokalemia: Secondary | ICD-10-CM | POA: Diagnosis present

## 2015-02-15 DIAGNOSIS — Z7901 Long term (current) use of anticoagulants: Secondary | ICD-10-CM | POA: Diagnosis not present

## 2015-02-15 DIAGNOSIS — Z79899 Other long term (current) drug therapy: Secondary | ICD-10-CM | POA: Diagnosis not present

## 2015-02-15 DIAGNOSIS — Z86718 Personal history of other venous thrombosis and embolism: Secondary | ICD-10-CM | POA: Diagnosis not present

## 2015-02-15 DIAGNOSIS — L89302 Pressure ulcer of unspecified buttock, stage 2: Secondary | ICD-10-CM | POA: Diagnosis present

## 2015-02-15 DIAGNOSIS — G9340 Encephalopathy, unspecified: Secondary | ICD-10-CM | POA: Diagnosis not present

## 2015-02-15 DIAGNOSIS — Z515 Encounter for palliative care: Secondary | ICD-10-CM

## 2015-02-15 DIAGNOSIS — E785 Hyperlipidemia, unspecified: Secondary | ICD-10-CM | POA: Diagnosis present

## 2015-02-15 DIAGNOSIS — R532 Functional quadriplegia: Secondary | ICD-10-CM | POA: Diagnosis present

## 2015-02-15 DIAGNOSIS — Z86711 Personal history of pulmonary embolism: Secondary | ICD-10-CM

## 2015-02-15 DIAGNOSIS — R5381 Other malaise: Secondary | ICD-10-CM | POA: Diagnosis present

## 2015-02-15 DIAGNOSIS — R627 Adult failure to thrive: Secondary | ICD-10-CM

## 2015-02-15 DIAGNOSIS — R739 Hyperglycemia, unspecified: Secondary | ICD-10-CM | POA: Diagnosis present

## 2015-02-15 DIAGNOSIS — F0280 Dementia in other diseases classified elsewhere without behavioral disturbance: Secondary | ICD-10-CM | POA: Diagnosis present

## 2015-02-15 DIAGNOSIS — R4182 Altered mental status, unspecified: Secondary | ICD-10-CM | POA: Diagnosis present

## 2015-02-15 DIAGNOSIS — I2699 Other pulmonary embolism without acute cor pulmonale: Secondary | ICD-10-CM | POA: Diagnosis present

## 2015-02-15 DIAGNOSIS — R569 Unspecified convulsions: Secondary | ICD-10-CM

## 2015-02-15 LAB — URINALYSIS, ROUTINE W REFLEX MICROSCOPIC
BILIRUBIN URINE: NEGATIVE
GLUCOSE, UA: NEGATIVE mg/dL
HGB URINE DIPSTICK: NEGATIVE
KETONES UR: NEGATIVE mg/dL
Leukocytes, UA: NEGATIVE
Nitrite: NEGATIVE
Protein, ur: NEGATIVE mg/dL
Specific Gravity, Urine: 1.009 (ref 1.005–1.030)
UROBILINOGEN UA: 1 mg/dL (ref 0.0–1.0)
pH: 7 (ref 5.0–8.0)

## 2015-02-15 LAB — CBC
HCT: 34.1 % — ABNORMAL LOW (ref 36.0–46.0)
HEMOGLOBIN: 10.9 g/dL — AB (ref 12.0–15.0)
MCH: 30.1 pg (ref 26.0–34.0)
MCHC: 32 g/dL (ref 30.0–36.0)
MCV: 94.2 fL (ref 78.0–100.0)
Platelets: 326 10*3/uL (ref 150–400)
RBC: 3.62 MIL/uL — ABNORMAL LOW (ref 3.87–5.11)
RDW: 15.1 % (ref 11.5–15.5)
WBC: 7.1 10*3/uL (ref 4.0–10.5)

## 2015-02-15 LAB — RAPID URINE DRUG SCREEN, HOSP PERFORMED
Amphetamines: NOT DETECTED
Barbiturates: NOT DETECTED
Benzodiazepines: POSITIVE — AB
Cocaine: NOT DETECTED
Opiates: NOT DETECTED
Tetrahydrocannabinol: NOT DETECTED

## 2015-02-15 LAB — DIFFERENTIAL
Basophils Absolute: 0.1 10*3/uL (ref 0.0–0.1)
Basophils Relative: 1 % (ref 0–1)
EOS ABS: 0 10*3/uL (ref 0.0–0.7)
Eosinophils Relative: 0 % (ref 0–5)
Lymphocytes Relative: 23 % (ref 12–46)
Lymphs Abs: 1.7 10*3/uL (ref 0.7–4.0)
MONO ABS: 0.3 10*3/uL (ref 0.1–1.0)
Monocytes Relative: 4 % (ref 3–12)
NEUTROS ABS: 5.1 10*3/uL (ref 1.7–7.7)
Neutrophils Relative %: 72 % (ref 43–77)

## 2015-02-15 LAB — COMPREHENSIVE METABOLIC PANEL
ALT: 12 U/L (ref 0–35)
AST: 32 U/L (ref 0–37)
Albumin: 3 g/dL — ABNORMAL LOW (ref 3.5–5.2)
Alkaline Phosphatase: 64 U/L (ref 39–117)
Anion gap: 13 (ref 5–15)
BILIRUBIN TOTAL: 0.3 mg/dL (ref 0.3–1.2)
BUN: <5 mg/dL — ABNORMAL LOW (ref 6–23)
CO2: 21 mmol/L (ref 19–32)
Calcium: 9.3 mg/dL (ref 8.4–10.5)
Chloride: 106 mmol/L (ref 96–112)
Creatinine, Ser: 0.59 mg/dL (ref 0.50–1.10)
GFR calc non Af Amer: 87 mL/min — ABNORMAL LOW (ref >90)
GLUCOSE: 182 mg/dL — AB (ref 70–99)
Potassium: 3.6 mmol/L (ref 3.5–5.1)
Sodium: 140 mmol/L (ref 135–145)
Total Protein: 7 g/dL (ref 6.0–8.3)

## 2015-02-15 LAB — I-STAT CHEM 8, ED
BUN: 4 mg/dL — ABNORMAL LOW (ref 6–23)
CHLORIDE: 104 mmol/L (ref 96–112)
Calcium, Ion: 1.2 mmol/L (ref 1.13–1.30)
Creatinine, Ser: 0.4 mg/dL — ABNORMAL LOW (ref 0.50–1.10)
Glucose, Bld: 186 mg/dL — ABNORMAL HIGH (ref 70–99)
HEMATOCRIT: 36 % (ref 36.0–46.0)
HEMOGLOBIN: 12.2 g/dL (ref 12.0–15.0)
Potassium: 3.6 mmol/L (ref 3.5–5.1)
Sodium: 142 mmol/L (ref 135–145)
TCO2: 20 mmol/L (ref 0–100)

## 2015-02-15 LAB — PROTIME-INR
INR: 2.29 — AB (ref 0.00–1.49)
Prothrombin Time: 25.4 seconds — ABNORMAL HIGH (ref 11.6–15.2)

## 2015-02-15 LAB — TROPONIN I: Troponin I: <0.03 ng/mL (ref <0.031)

## 2015-02-15 LAB — ETHANOL: Alcohol, Ethyl (B): <5 mg/dL (ref 0–9)

## 2015-02-15 LAB — CBG MONITORING, ED
GLUCOSE-CAPILLARY: 110 mg/dL — AB (ref 70–99)
Glucose-Capillary: 157 mg/dL — ABNORMAL HIGH (ref 70–99)

## 2015-02-15 LAB — APTT: aPTT: 37 seconds (ref 24–37)

## 2015-02-15 LAB — GLUCOSE, CAPILLARY: Glucose-Capillary: 110 mg/dL — ABNORMAL HIGH (ref 70–99)

## 2015-02-15 LAB — MRSA PCR SCREENING: MRSA BY PCR: NEGATIVE

## 2015-02-15 MED ORDER — ROCURONIUM BROMIDE 50 MG/5ML IV SOLN
INTRAVENOUS | Status: AC
  Filled 2015-02-15: qty 2

## 2015-02-15 MED ORDER — SODIUM CHLORIDE 0.9 % IV SOLN
INTRAVENOUS | Status: DC
  Administered 2015-02-15: 20:00:00 via INTRAVENOUS

## 2015-02-15 MED ORDER — SUCCINYLCHOLINE CHLORIDE 20 MG/ML IJ SOLN
INTRAMUSCULAR | Status: AC
  Filled 2015-02-15: qty 1

## 2015-02-15 MED ORDER — ONDANSETRON HCL 4 MG PO TABS
4.0000 mg | ORAL_TABLET | Freq: Four times a day (QID) | ORAL | Status: DC | PRN
Start: 2015-02-15 — End: 2015-02-18

## 2015-02-15 MED ORDER — HYDRALAZINE HCL 20 MG/ML IJ SOLN
5.0000 mg | INTRAMUSCULAR | Status: DC | PRN
  Administered 2015-02-16: 5 mg via INTRAVENOUS
  Filled 2015-02-15: qty 1

## 2015-02-15 MED ORDER — ETOMIDATE 2 MG/ML IV SOLN
INTRAVENOUS | Status: AC
  Filled 2015-02-15: qty 20

## 2015-02-15 MED ORDER — ONDANSETRON HCL 4 MG/2ML IJ SOLN: 4.0000 mg | Freq: Four times a day (QID) | INTRAMUSCULAR | Status: DC | PRN

## 2015-02-15 MED ORDER — LIDOCAINE HCL (CARDIAC) 20 MG/ML IV SOLN
INTRAVENOUS | Status: AC
  Filled 2015-02-15: qty 5

## 2015-02-15 MED ORDER — INSULIN ASPART 100 UNIT/ML ~~LOC~~ SOLN: 0.0000 [IU] | Freq: Three times a day (TID) | SUBCUTANEOUS | Status: DC

## 2015-02-15 MED ORDER — SODIUM CHLORIDE 0.9 % IV SOLN
500.0000 mg | Freq: Two times a day (BID) | INTRAVENOUS | Status: DC
  Administered 2015-02-15 – 2015-02-18 (×6): 500 mg via INTRAVENOUS
  Filled 2015-02-15 (×9): qty 5

## 2015-02-15 MED ORDER — SODIUM CHLORIDE 0.9 % IV SOLN
500.0000 mg | Freq: Once | INTRAVENOUS | Status: AC
  Administered 2015-02-15: 500 mg via INTRAVENOUS
  Filled 2015-02-15: qty 5

## 2015-02-15 NOTE — ED Notes (Signed)
Still no seizure activity since arrival.

## 2015-02-15 NOTE — Progress Notes (Signed)
ANTICOAGULATION CONSULT NOTE - Initial Consult  Pharmacy Consult for Lovenox Indication: History of PE/DVT  Allergies  Allergen Reactions  . Risperdal [Risperidone] Other (See Comments)    sedation    Patient Measurements:   Vital Signs: Temp: 97.1 F (36.2 C) (04/25 1148) Temp Source: Rectal (04/25 1148) BP: 128/64 mmHg (04/25 1345) Pulse Rate: 69 (04/25 1345)  Labs:  Recent Labs  02/15/15 1140 02/15/15 1158  HGB 10.9* 12.2  HCT 34.1* 36.0  PLT 326  --   APTT 37  --   LABPROT 25.4*  --   INR 2.29*  --   CREATININE 0.59 0.40*  TROPONINI <0.03  --     CrCl cannot be calculated (Unknown ideal weight.).   Medical History: Past Medical History  Diagnosis Date  . Unspecified constipation   . Anxiety state, unspecified   . Colitis, enteritis, and gastroenteritis of presumed infectious origin 11/02/2011  . Pneumonia, organism unspecified 11/02/2011  . Cystitis, unspecified   . Impetigo   . Renal artery stenosis   . Dementia in conditions classified elsewhere with behavioral disturbance   . Hypercalcemia   . Reflux esophagitis   . Chest pain, unspecified   . Abnormality of gait   . Hyperosmolality and/or hypernatremia   . Unspecified disorder of liver   . Candidiasis of vulva and vagina   . Alzheimer's disease   . Chest pain, unspecified 08/19/2008  . Dementia in conditions classified elsewhere without behavioral disturbance   . Depressive disorder, not elsewhere classified   . Esophageal reflux   . Other and unspecified hyperlipidemia   . Loss of weight   . Unspecified persistent mental disorders due to conditions classified elsewhere   . Reactive confusion   . Unspecified essential hypertension   . Other seborrheic keratosis   . Osteoarthrosis, unspecified whether generalized or localized, unspecified site   . Stiffness of joint, not elsewhere classified, unspecified site   . Memory loss 07/24/2007  . Headache(784.0)   . Undiagnosed cardiac murmurs      Medications:   (Not in a hospital admission)  Assessment: 89 yoF who presents with seizures. On Coumadin PTA for history of bilateral PE and DVTs, however patient is NPO. Pharmacy consulted to dose Lovenox. INR 2.29, Hgb 12.2, platelets ok  Goal of Therapy:  Anti-Xa level 0.6-1 units/ml 4hrs after LMWH dose given Monitor platelets by anticoagulation protocol: Yes   Plan:  Will hold Lovenox today since INR is therapeutic. Follow up PT/INR tomorrow Will plan for Lovenox 1 mg/kg IV q12h  Conception ChancyShah, Laraina Sulton D 02/15/2015,3:53 PM

## 2015-02-15 NOTE — ED Notes (Addendum)
Pt with late stage alzheimers to ED for multiple seizures - 8 total lasting up to 1 minute.  Pt was unable to maintain airway and was bagged until arrival to ed.  Given 5 mg versed en-route.  Maintaining airway on NRB. CBG 270.  Per EMS family had just fed breakfast 1 hour prior.

## 2015-02-15 NOTE — H&P (Signed)
[facility] History and Physical  Consuello Closssterline P Gibas WUJ:[id number] DOB: 1936/06/12 DOA: 02/15/2015  Referring physician: Zadie Rhineonald Wickline, MD PCP: Bufford SpikesEED, [name], DO   Chief Complaint: altered mental status   HPI:  Patient is 79 year old female with end-stage dementia, nonverbal and bedbound at baseline, history of pulmonary embolus and DVTs on Coumadin, brought to [facility] emergency department after an apparent episode of seizure at home. Please note that patient is unable to provide any history due to advanced dementia and post ictal state, no family available at bedside at the time of this interview. Most of the information obtained from emergency room physician and available records. Patient's son was present in the room earlier in the day and reported sudden onset of confusion and generalized body stiffening. No reported fevers or chills, no reported abdominal or urinary concerns. No reported shortness of breath.  In emergency department, patient noted to be minimally responsive to sternal rub, vital signs stable, blood work unremarkable, has received 1 dose of Versed in emergency department. Patient able to protect airways and therefore has not required intubation. Emergency room doctor consulted with neurologist on call, recommendation was to start Keppra. [facility] asked to admit to step down unit for further evaluation.  Assessment and Plan: Principal Problem:   Acute encephalopathy - Appears to be secondary to status epilepticus - Patient still minimally responsive to sternal rub only been able to protect airways - Agree with admission to step down unit - We'll start patient on Keppra as recommended per neurologist - Keep nothing by mouth for now until mental status improves - UA with no signs of UTI Active Problems:   FTT (failure to thrive) in adult - will need to keep NPO for now until mental status improves  - once more alert will needs SLP evaluation - aspiration risk     Alzheimer's disease - advanced and pt with significant deconditioning - Consider palliative care input if clinical status continues to deteriorate   Status epilepticus - Start with Keppra IV as recommended per neurologist - once mental status improves consider changing to by mouth    Hyperlipidemia with target LDL less than 100 - Hold statin until oral intake improves    Hyperglycemia - will check A1C - place on SSI for now    Bilateral pulmonary embolism, history of DVT - Patient on Coumadin at home, INR 2.29 on admission  - We'll place on Lovenox per pharmacy for now until oral intake improves    Essential hypertension - Blood pressure stable on admission, 126/64 - Hold home blood pressure medications for now until oral intake improves   Functional quadriplegia - Patient bedbound at baseline due to severe advanced dementia - Consider PT evaluation if family desires skilled nursing facility placement   DVT prophylaxis - on Lovenox per pharmacy    Radiological Exams on Admission: Ct Head Wo Contrast  02/15/2015   CLINICAL DATA:  Seizures.  Late stage Alzheimer's disease.  EXAM: CT HEAD WITHOUT CONTRAST  TECHNIQUE: Contiguous axial images were obtained from the base of the skull through the vertex without intravenous contrast.  COMPARISON:  07/31/2014.  FINDINGS: No mass lesion, mass effect, midline shift, hydrocephalus, hemorrhage. No acute territorial cortical ischemia/infarct. Atrophy and chronic ischemic white matter disease is present. Paranasal sinuses are within normal limits. Intracranial atherosclerosis.  IMPRESSION: Atrophy and chronic ischemic white matter disease without acute intracranial abnormality.   Electronically Signed   By: Andreas NewportGeoffrey  Lamke M.D.   On: 02/15/2015 12:51   Dg Chest  Portable 1 View  02/15/2015   CLINICAL DATA:  Seizures.  EXAM: PORTABLE CHEST - 1 VIEW  COMPARISON:  CT chest 07/27/2014.  PA and lateral chest 05/03/2013.  FINDINGS: The lungs are clear. Heart  size is upper normal. No pneumothorax or pleural effusion.  IMPRESSION: No acute disease.   Electronically Signed   By: Drusilla Kanner M.D.   On: 02/15/2015 13:08     Code Status: partial Code, no CPR but OK with temporary intubation  Family Communication: No family at bedside  Disposition Plan: Admit for further evaluation    Danie Binder [facility] [phone]   Review of Systems:  Unable to obtain due to AMS    Past Medical History  Diagnosis Date  . Unspecified constipation   . Anxiety state, unspecified   . Colitis, enteritis, and gastroenteritis of presumed infectious origin 11/02/2011  . Pneumonia, organism unspecified 11/02/2011  . Cystitis, unspecified   . Impetigo   . Renal artery stenosis   . Dementia in conditions classified elsewhere with behavioral disturbance   . Hypercalcemia   . Reflux esophagitis   . Chest pain, unspecified   . Abnormality of gait   . Hyperosmolality and/or hypernatremia   . Unspecified disorder of liver   . Candidiasis of vulva and vagina   . Alzheimer's disease   . Chest pain, unspecified 08/19/2008  . Dementia in conditions classified elsewhere without behavioral disturbance   . Depressive disorder, not elsewhere classified   . Esophageal reflux   . Other and unspecified hyperlipidemia   . Loss of weight   . Unspecified persistent mental disorders due to conditions classified elsewhere   . Reactive confusion   . Unspecified essential hypertension   . Other seborrheic keratosis   . Osteoarthrosis, unspecified whether generalized or localized, unspecified site   . Stiffness of joint, not elsewhere classified, unspecified site   . Memory loss 07/24/2007  . Headache(784.0)   . Undiagnosed cardiac murmurs     Past Surgical History  Procedure Laterality Date  . Cesarean section      Social History:  reports that she has never smoked. She has never used smokeless tobacco. She reports that she does not drink alcohol or use illicit  drugs.  Allergies  Allergen Reactions  . Risperdal [Risperidone] Other (See Comments)    sedation    Unable to obtain family history as pt now with AMS and with advanced dementia    Prior to Admission medications   Medication Sig Start Date End Date Taking? Authorizing Provider  alendronate (FOSAMAX) 70 MG tablet TAKE 1 TABLET ONCE A WEEK ON TUESDAYS. TAKE WITH A FULL GLASS OF WATER ON AN EMPTY STOMACH. 11/09/14   [name] L Reed, DO  ALPRAZolam (XANAX) 0.5 MG tablet TAKE 1 TAB BY MOUTH 3 TIMES A DAYS AND 1 TAB AT BEDTIME AS NEEDED FOR ANXIETY 01/13/15   Kirt Boys, DO  ALPRAZolam Prudy Feeler) 0.5 MG tablet TAKE 1 TAB BY MOUTH 3 TIMES A DAYS AND 1 TAB AT BEDTIME AS NEEDED FOR ANXIETY 01/13/15   Kirt Boys, DO  AMBULATORY NON [facility] MEDICATION Air Mattress with Gel Overlay Dx: T07, L89.221, L89.150, L89.510 12/25/14   Kirt Boys, DO  diclofenac sodium (VOLTAREN) 1 % GEL Apply 1 application topically 2 (two) times daily. Apply to knees and hips    Historical Provider, MD  doxycycline (VIBRA-TABS) 100 MG tablet Take 1 tablet (100 mg total) by mouth 2 (two) times daily. 12/18/14   Kirt Boys, DO  hydrALAZINE (APRESOLINE) 50  MG tablet Take 1 tablet (50 mg total) by mouth 3 (three) times daily. 10/30/14   [name] L Reed, DO  hydrALAZINE (APRESOLINE) 50 MG tablet TAKE 1 TABLET (50 MG TOTAL) BY MOUTH 3 (THREE) TIMES DAILY. 12/14/14   [name] L Reed, DO  losartan (COZAAR) 25 MG tablet TAKE 1 TABLET DAILY FOR BLOOD PRESSURE 11/10/14   [name] L Reed, DO  metoprolol tartrate (LOPRESSOR) 25 MG tablet TAKE ONE-HALF (1/2) TABLET TWICE A DAY 11/23/14   [name] L Reed, DO  NAMENDA 10 MG tablet TAKE 1 TABLET TWICE A DAY FOR MEMORY LOSS 09/16/14   Oneal Grout, MD  nitroGLYCERIN (NITROSTAT) 0.4 MG SL tablet Place 0.4 mg under the tongue every 5 (five) minutes as needed for chest pain. Take one tablet sublingual every 5 minutes not to exceed three tablets for chest pain    Historical Provider, MD  omeprazole  (PRILOSEC) 20 MG capsule TAKE ONE CAPSULE BY MOUTH EVERY DAY FOR REFLUX 02/03/15   Kimber Relic, MD  rosuvastatin (CRESTOR) 10 MG tablet Take 10 mg by mouth daily.    Historical Provider, MD  warfarin (COUMADIN) 4 MG tablet TAKE 1 TABLET (4 MG TOTAL) BY MOUTH DAILY. 10/30/14   Kermit Balo, DO    Physical Exam: Filed Vitals:   02/15/15 1300 02/15/15 1315 02/15/15 1330 02/15/15 1345  BP: 148/77 150/75 167/82 128/64  Pulse: 78 83 88 69  Temp:      TempSrc:      Resp: SpO2: 100% 99% 99% 100%    Physical Exam  Constitutional: Appears lethargic and only responsive to sternal rub HENT: Normocephalic. External right and left ear normal. Dry MM Eyes: PERRLA, no scleral icterus.  Neck: Normal ROM. Neck supple. No JVD. No tracheal deviation. No thyromegaly.  CVS: RRR, S1/S2 +, no gallops, no carotid bruit.  Pulmonary: Effort and breath sounds normal, no stridor, diminished breath sounds at bases  Abdominal: Soft. BS +,  no distension, tenderness, rebound or guarding.  Musculoskeletal: No evidence of edema and no tenderness.  Lymphadenopathy: No lymphadenopathy noted, cervical, inguinal. Neuro: Moans with sternal rub, increased body tone throughout, non verbal  Skin: Skin is warm and dry. No rash noted.  Psychiatric: Unable to obtain due to altered mental status   Labs on Admission:  Basic Metabolic Panel:  Recent Labs Lab 02/15/15 1140 02/15/15 1158  NA 140 142  K 3.6 3.6  CL 106 104  CO2 21  --   GLUCOSE 182* 186*  BUN <5* 4*  CREATININE 0.59 0.40*  CALCIUM 9.3  --    Liver Function Tests:  Recent Labs Lab 02/15/15 1140  AST 32  ALT 12  ALKPHOS 64  BILITOT 0.3  PROT 7.0  ALBUMIN 3.0*   CBC:  Recent Labs Lab 02/15/15 1140 02/15/15 1158  WBC 7.1  --   NEUTROABS 5.1  --   HGB 10.9* 12.2  HCT 34.1* 36.0  MCV 94.2  --   PLT 326  --    Cardiac Enzymes:  Recent Labs Lab 02/15/15 1140  TROPONINI <0.03   BNP: Invalid input(s):  POCBNP CBG:  Recent Labs Lab 02/15/15 1146  GLUCAP 157*    EKG: pending  Time spent 75 minutes    If 7PM-7AM, please contact night-coverage [email or web address] Password [facility] 02/15/2015, 2:13 PM

## 2015-02-15 NOTE — ED Notes (Signed)
Pharmacy called.  Keppra was sent to wrong pod - Fast track.

## 2015-02-15 NOTE — ED Provider Notes (Signed)
CSN: [id number]     Arrival date & time 02/15/15  1143 History   First MD Initiated Contact with Patient 02/15/15 1148     Chief Complaint  Patient presents with  . Seizures    LEVEL 5 CAVEAT DUE TO ACUITY OF CONDITION  Patient is a 79 y.o. female presenting with seizures. The history is provided by a relative, a caregiver and the nursing home.  Seizures Seizure activity on arrival: no   Episode characteristics: stiffening   Postictal symptoms: confusion   Severity:  Severe Timing:  Intermittent Number of seizures this episode:  8 Progression:  Partially resolved Recent head injury:  No recent head injuries PTA treatment:  Midazolam History of seizures: no   PT PRESENTS FROM HOME SHE HAS H/O SEVERE DEMENTIA CARED FOR BY SON [name] AND ALSO HOME HEALTH NURSING THIS MORNING SHE HAD UP TO 3 SEIZURES FOR FAMILY, EMS WAS CALLED AND THEY NOTED UP TO 5 SEIZURES EN ROUTE NO H/O SEIZURE SHE WAS GIVEN VERSED  EN ROUTE NO OTHER DETAILS ARE KNOWN AT THIS TIME  Past Medical History  Diagnosis Date  . Unspecified constipation   . Anxiety state, unspecified   . Colitis, enteritis, and gastroenteritis of presumed infectious origin 11/02/2011  . Pneumonia, organism unspecified 11/02/2011  . Cystitis, unspecified   . Impetigo   . Renal artery stenosis   . Dementia in conditions classified elsewhere with behavioral disturbance   . Hypercalcemia   . Reflux esophagitis   . Chest pain, unspecified   . Abnormality of gait   . Hyperosmolality and/or hypernatremia   . Unspecified disorder of liver   . Candidiasis of vulva and vagina   . Alzheimer's disease   . Chest pain, unspecified 08/19/2008  . Dementia in conditions classified elsewhere without behavioral disturbance   . Depressive disorder, not elsewhere classified   . Esophageal reflux   . Other and unspecified hyperlipidemia   . Loss of weight   . Unspecified persistent mental disorders due to conditions classified elsewhere   .  Reactive confusion   . Unspecified essential hypertension   . Other seborrheic keratosis   . Osteoarthrosis, unspecified whether generalized or localized, unspecified site   . Stiffness of joint, not elsewhere classified, unspecified site   . Memory loss 07/24/2007  . Headache(784.0)   . Undiagnosed cardiac murmurs    Past Surgical History  Procedure Laterality Date  . Cesarean section     No family history on file. History  Substance Use Topics  . Smoking status: Never Smoker   . Smokeless tobacco: Never Used  . Alcohol Use: No   OB History    No data available     Review of Systems  Unable to perform ROS: Acuity of condition  Neurological: Positive for seizures.      Allergies  Risperdal  Home Medications   Prior to Admission medications   Medication Sig Start Date End Date Taking? Authorizing Provider  alendronate (FOSAMAX) 70 MG tablet TAKE 1 TABLET ONCE A WEEK ON TUESDAYS. TAKE WITH A FULL GLASS OF WATER ON AN EMPTY STOMACH. 11/09/14   [name] L Reed, DO  ALPRAZolam (XANAX) 0.5 MG tablet TAKE 1 TAB BY MOUTH 3 TIMES A DAYS AND 1 TAB AT BEDTIME AS NEEDED FOR ANXIETY 01/13/15   Kirt Boys, DO  ALPRAZolam (XANAX) 0.5 MG tablet TAKE 1 TAB BY MOUTH 3 TIMES A DAYS AND 1 TAB AT BEDTIME AS NEEDED FOR ANXIETY 01/13/15   Kirt Boys, DO  AMBULATORY NON  [facility] MEDICATION Air Mattress with Gel Overlay DxLeandra Kern, L89.150, L89.510 12/25/14   Kirt Boys, DO  diclofenac sodium (VOLTAREN) 1 % GEL Apply 1 application topically 2 (two) times daily. Apply to knees and hips    Historical Provider, MD  doxycycline (VIBRA-TABS) 100 MG tablet Take 1 tablet (100 mg total) by mouth 2 (two) times daily. 12/18/14   Kirt Boys, DO  hydrALAZINE (APRESOLINE) 50 MG tablet Take 1 tablet (50 mg total) by mouth 3 (three) times daily. 10/30/14   [name] L Reed, DO  hydrALAZINE (APRESOLINE) 50 MG tablet TAKE 1 TABLET (50 MG TOTAL) BY MOUTH 3 (THREE) TIMES DAILY. 12/14/14   [name] L Reed, DO   losartan (COZAAR) 25 MG tablet TAKE 1 TABLET DAILY FOR BLOOD PRESSURE 11/10/14   [name] L Reed, DO  metoprolol tartrate (LOPRESSOR) 25 MG tablet TAKE ONE-HALF (1/2) TABLET TWICE A DAY 11/23/14   [name] L Reed, DO  NAMENDA 10 MG tablet TAKE 1 TABLET TWICE A DAY FOR MEMORY LOSS 09/16/14   Oneal Grout, MD  nitroGLYCERIN (NITROSTAT) 0.4 MG SL tablet Place 0.4 mg under the tongue every 5 (five) minutes as needed for chest pain. Take one tablet sublingual every 5 minutes not to exceed three tablets for chest pain    Historical Provider, MD  omeprazole (PRILOSEC) 20 MG capsule TAKE ONE CAPSULE BY MOUTH EVERY DAY FOR REFLUX 02/03/15   Kimber Relic, MD  rosuvastatin (CRESTOR) 10 MG tablet Take 10 mg by mouth daily.    Historical Provider, MD  warfarin (COUMADIN) 4 MG tablet TAKE 1 TABLET (4 MG TOTAL) BY MOUTH DAILY. 10/30/14   [name] L Reed, DO   BP 150/83 mmHg  Pulse 84  Temp(Src) 97.1 F (36.2 C) (Rectal)  Resp 13  SpO2 100% Physical Exam CONSTITUTIONAL: elderly, frail and ill appearing HEAD: Normocephalic/atraumatic EYES: PERRL ENMT: Mucous membranes dry, NP airway in nostrils NECK: supple no meningeal signs CV: S1/S2 noted LUNGS: Lungs are clear to auscultation bilaterally, no apparent distress ABDOMEN: soft, nontender NEURO: Pt is somnolent and will not respond to voice/pain.  She is unresponsive EXTREMITIES: pulses normal/equal, no deformities noted SKIN: warm, color normal, sacral decubitus wounds noted PSYCH: unable to assess  ED Course  Procedures  CRITICAL CARE Performed by: Joya Gaskins Total critical care time: 31 Critical care time was exclusive of separately billable procedures and treating other patients. Critical care was necessary to treat or prevent imminent or life-threatening deterioration. Critical care was time spent personally by me on the following activities: development of treatment plan with patient and/or surrogate as well as nursing, discussions with  consultants, evaluation of patient's response to treatment, examination of patient, obtaining history from patient or surrogate, ordering and performing treatments and interventions, ordering and review of laboratory studies, ordering and review of radiographic studies, pulse oximetry and re-evaluation of patient's condition. NEW ONSET SEIZURES, REQUIRING CONTINUOUS MONITORING AS WELL AS STARTING NEW ANTI-EPILEPTIC MEDICATIONS  12:10 PM Pt seen on arrival She very somnolent from versed D/w son brian - no chest compressions if her conditions worsens.  Would welcome intubation to temporarily stabilize airway Currently pt is not hypoxic with spontaneous respirations but somnolent She is off to CT imaging at this time Will follow closely Son reports pt has severe dementia at baseline and requires significant care at baseline 12:41 PM Pt resting comfortably Spontaneous respirations noted Awaiting imaging 1:14 PM CT head negative Pt becoming more responsive D/w dr Roseanne Reno neuro Recommend keppra Will admit Family updated 2:13 PM  D/w dr Izola Pricemyers will admit to stepdown BP 128/64 mmHg  Pulse 69  Temp(Src) 97.1 F (36.2 C) (Rectal)  Resp 15  SpO2 100%  Labs Review Labs Reviewed  PROTIME-INR - Abnormal; Notable for the following:    Prothrombin Time 25.4 (*)    INR 2.29 (*)    All other components within normal limits  CBC - Abnormal; Notable for the following:    RBC 3.62 (*)    Hemoglobin 10.9 (*)    HCT 34.1 (*)    All other components within normal limits  COMPREHENSIVE METABOLIC PANEL - Abnormal; Notable for the following:    Glucose, Bld 182 (*)    BUN <5 (*)    Albumin 3.0 (*)    GFR calc non Af Amer 87 (*)    All other components within normal limits  I-STAT CHEM 8, ED - Abnormal; Notable for the following:    BUN 4 (*)    Creatinine, Ser 0.40 (*)    Glucose, Bld 186 (*)    All other components within normal limits  CBG MONITORING, ED - Abnormal; Notable for the  following:    Glucose-Capillary 157 (*)    All other components within normal limits  ETHANOL  APTT  DIFFERENTIAL  URINALYSIS, ROUTINE W REFLEX MICROSCOPIC  TROPONIN I  URINE RAPID DRUG SCREEN (HOSP PERFORMED)    Imaging Review Ct Head Wo Contrast  02/15/2015   CLINICAL DATA:  Seizures.  Late stage Alzheimer's disease.  EXAM: CT HEAD WITHOUT CONTRAST  TECHNIQUE: Contiguous axial images were obtained from the base of the skull through the vertex without intravenous contrast.  COMPARISON:  07/31/2014.  FINDINGS: No mass lesion, mass effect, midline shift, hydrocephalus, hemorrhage. No acute territorial cortical ischemia/infarct. Atrophy and chronic ischemic white matter disease is present. Paranasal sinuses are within normal limits. Intracranial atherosclerosis.  IMPRESSION: Atrophy and chronic ischemic white matter disease without acute intracranial abnormality.   Electronically Signed   By: Andreas NewportGeoffrey  Lamke M.D.   On: 02/15/2015 12:51   Dg Chest Portable 1 View  02/15/2015   CLINICAL DATA:  Seizures.  EXAM: PORTABLE CHEST - 1 VIEW  COMPARISON:  CT chest 07/27/2014.  PA and lateral chest 05/03/2013.  FINDINGS: The lungs are clear. Heart size is upper normal. No pneumothorax or pleural effusion.  IMPRESSION: No acute disease.   Electronically Signed   By: Drusilla Kannerhomas  Dalessio M.D.   On: 02/15/2015 13:08     EKG Interpretation   Date/Time:  Monday February 15 2015 11:47:37 EDT Ventricular Rate:  86 PR Interval:  147 QRS Duration: 96 QT Interval:  384 QTC Calculation: 459 R Axis:   61 Text Interpretation:  Sinus rhythm artifact noted Non-specific ST-t  changes No significant change since last tracing Confirmed by Bebe ShaggyWICKLINE   MD, Dorinda HillNALD ([id number]) on 02/15/2015 12:01:53 PM      MDM   Final diagnoses:  Status epilepticus    Nursing notes including past medical history and social history reviewed and considered in documentation Labs/vital reviewed myself and considered during  evaluation xrays/imaging reviewed by myself and considered during evaluation     Zadie Rhineonald Ashiah Karpowicz, MD 02/15/15 1415

## 2015-02-15 NOTE — ED Notes (Signed)
CHARTING ERROR: This RN charted 'expired' at 1650 mistakenly when changing status to admit.

## 2015-02-15 NOTE — Progress Notes (Signed)
Respiratory therapy note-patient assessed by MD at this time, placed on fio2 100% NRB mask, continue to monitor.

## 2015-02-15 NOTE — ED Notes (Signed)
CBG 157 

## 2015-02-15 NOTE — ED Notes (Signed)
Pt removed from NRB and placed on 2L  per Dr Bebe ShaggyWickline

## 2015-02-16 ENCOUNTER — Inpatient Hospital Stay (HOSPITAL_COMMUNITY): Payer: Medicare Other

## 2015-02-16 DIAGNOSIS — G309 Alzheimer's disease, unspecified: Secondary | ICD-10-CM

## 2015-02-16 DIAGNOSIS — I10 Essential (primary) hypertension: Secondary | ICD-10-CM

## 2015-02-16 DIAGNOSIS — G9340 Encephalopathy, unspecified: Secondary | ICD-10-CM

## 2015-02-16 DIAGNOSIS — E43 Unspecified severe protein-calorie malnutrition: Secondary | ICD-10-CM | POA: Insufficient documentation

## 2015-02-16 DIAGNOSIS — R627 Adult failure to thrive: Secondary | ICD-10-CM

## 2015-02-16 LAB — BASIC METABOLIC PANEL
Anion gap: 9 (ref 5–15)
CO2: 25 mmol/L (ref 19–32)
Calcium: 9.7 mg/dL (ref 8.4–10.5)
Chloride: 106 mmol/L (ref 96–112)
Creatinine, Ser: 0.4 mg/dL — ABNORMAL LOW (ref 0.50–1.10)
GFR calc Af Amer: >90 mL/min (ref >90)
GFR calc non Af Amer: >90 mL/min (ref >90)
GLUCOSE: 100 mg/dL — AB (ref 70–99)
Potassium: 3.3 mmol/L — ABNORMAL LOW (ref 3.5–5.1)
Sodium: 140 mmol/L (ref 135–145)

## 2015-02-16 LAB — HEMOGLOBIN A1C
Hgb A1c MFr Bld: 5.5 % (ref 4.8–5.6)
MEAN PLASMA GLUCOSE: 111 mg/dL

## 2015-02-16 LAB — CBC
HCT: 32.1 % — ABNORMAL LOW (ref 36.0–46.0)
Hemoglobin: 10.2 g/dL — ABNORMAL LOW (ref 12.0–15.0)
MCH: 29.3 pg (ref 26.0–34.0)
MCHC: 31.8 g/dL (ref 30.0–36.0)
MCV: 92.2 fL (ref 78.0–100.0)
PLATELETS: 376 10*3/uL (ref 150–400)
RBC: 3.48 MIL/uL — ABNORMAL LOW (ref 3.87–5.11)
RDW: 15.2 % (ref 11.5–15.5)
WBC: 10.1 10*3/uL (ref 4.0–10.5)

## 2015-02-16 LAB — GLUCOSE, CAPILLARY
GLUCOSE-CAPILLARY: 89 mg/dL (ref 70–99)
Glucose-Capillary: 65 mg/dL — ABNORMAL LOW (ref 70–99)
Glucose-Capillary: 122 mg/dL — ABNORMAL HIGH (ref 70–99)
Glucose-Capillary: 151 mg/dL — ABNORMAL HIGH (ref 70–99)

## 2015-02-16 LAB — PROTIME-INR
INR: 2.29 — ABNORMAL HIGH (ref 0.00–1.49)
Prothrombin Time: 25.4 seconds — ABNORMAL HIGH (ref 11.6–15.2)

## 2015-02-16 LAB — URINE CULTURE
COLONY COUNT: NO GROWTH
CULTURE: NO GROWTH

## 2015-02-16 LAB — LIPASE, BLOOD: LIPASE: 22 U/L (ref 11–59)

## 2015-02-16 MED ORDER — POTASSIUM CHLORIDE CRYS ER 20 MEQ PO TBCR
20.0000 meq | EXTENDED_RELEASE_TABLET | Freq: Once | ORAL | Status: AC
  Administered 2015-02-16: 20 meq via ORAL
  Filled 2015-02-16: qty 1

## 2015-02-16 MED ORDER — WARFARIN SODIUM 5 MG PO TABS
5.0000 mg | ORAL_TABLET | Freq: Once | ORAL | Status: AC
  Administered 2015-02-16: 5 mg via ORAL
  Filled 2015-02-16: qty 1

## 2015-02-16 MED ORDER — ALPRAZOLAM 0.5 MG PO TABS: 0.5000 mg | ORAL_TABLET | Freq: Three times a day (TID) | ORAL | Status: DC | PRN

## 2015-02-16 MED ORDER — LOSARTAN POTASSIUM 25 MG PO TABS
25.0000 mg | ORAL_TABLET | Freq: Every day | ORAL | Status: DC
  Administered 2015-02-16 – 2015-02-18 (×3): 25 mg via ORAL
  Filled 2015-02-16 (×3): qty 1

## 2015-02-16 MED ORDER — ENSURE PUDDING PO PUDG
1.0000 | Freq: Three times a day (TID) | ORAL | Status: DC
  Administered 2015-02-18: 1 via ORAL

## 2015-02-16 MED ORDER — STARCH (THICKENING) PO POWD
ORAL | Status: DC | PRN
  Filled 2015-02-16: qty 227

## 2015-02-16 MED ORDER — PANTOPRAZOLE SODIUM 40 MG PO TBEC
40.0000 mg | DELAYED_RELEASE_TABLET | Freq: Every day | ORAL | Status: DC
  Administered 2015-02-17 – 2015-02-18 (×2): 40 mg via ORAL
  Filled 2015-02-16: qty 1

## 2015-02-16 MED ORDER — DICLOFENAC SODIUM 1 % TD GEL
4.0000 g | Freq: Four times a day (QID) | TRANSDERMAL | Status: DC
  Administered 2015-02-16 – 2015-02-18 (×8): 4 g via TOPICAL
  Filled 2015-02-16: qty 100

## 2015-02-16 MED ORDER — ENSURE ENLIVE PO LIQD
237.0000 mL | Freq: Three times a day (TID) | ORAL | Status: DC
  Administered 2015-02-16 – 2015-02-18 (×5): 237 mL via ORAL

## 2015-02-16 MED ORDER — WARFARIN - PHARMACIST DOSING INPATIENT: Freq: Every day | Status: DC

## 2015-02-16 MED ORDER — CLONIDINE HCL 0.1 MG PO TABS
0.1000 mg | ORAL_TABLET | Freq: Three times a day (TID) | ORAL | Status: DC | PRN
  Filled 2015-02-16: qty 1

## 2015-02-16 MED ORDER — METOPROLOL TARTRATE 12.5 MG HALF TABLET
12.5000 mg | ORAL_TABLET | Freq: Two times a day (BID) | ORAL | Status: DC
  Administered 2015-02-16 – 2015-02-18 (×5): 12.5 mg via ORAL
  Filled 2015-02-16 (×7): qty 1

## 2015-02-16 MED ORDER — ROSUVASTATIN CALCIUM 10 MG PO TABS
10.0000 mg | ORAL_TABLET | Freq: Every day | ORAL | Status: DC
  Administered 2015-02-16 – 2015-02-17 (×2): 10 mg via ORAL
  Filled 2015-02-16 (×3): qty 1

## 2015-02-16 NOTE — Progress Notes (Signed)
ANTICOAGULATION CONSULT NOTE - Follow Up Consult  Pharmacy Consult for Lovenox Indication: History of PE/DVT  Allergies  Allergen Reactions  . Risperdal [Risperidone] Other (See Comments)    sedation    Patient Measurements: Height: 5\' 2"  (157.5 cm) Weight: 110 lb 3.7 oz (50 kg) IBW/kg (Calculated) : 50.1   Vital Signs: Temp: 98.7 F (37.1 C) (04/26 0250) Temp Source: Axillary (04/26 0250) BP: 180/99 mmHg (04/26 0250) Pulse Rate: 131 (04/26 0250)  Labs:  Recent Labs  02/15/15 1140 02/15/15 1158 02/16/15 0315  HGB 10.9* 12.2 10.2*  HCT 34.1* 36.0 32.1*  PLT 326  --  376  APTT 37  --   --   LABPROT 25.4*  --  25.4*  INR 2.29*  --  2.29*  CREATININE 0.59 0.40* 0.40*  TROPONINI <0.03  --   --     Estimated Creatinine Clearance: 47.2 mL/min (by C-G formula based on Cr of 0.4).   Assessment: [age] YOF here with presumed status epilepticus, started on Keppra. She was on warfarin PTA for history of PE/DVT, but this is currently on hold as she is NPO. Pharmacy asked to dose Lovenox for treatment of her clotting history, however INR is still therapeutic, and it is not appropriate to start Lovenox yet.  INR 2.29. Hgb 10.2, plts 376- no bleeding noted. SCr 0.4 with est CrCL ~45-7950mL/min.  Goal of Therapy:  Anti-Xa level 0.6-1 units/ml 4hrs after LMWH dose given Monitor platelets by anticoagulation protocol: Yes   Plan:  -hold Lovenox until INR is <2 -next INR check in the morning- will avoid sticking patient multiple times to check INR as she is not in immediate danger if her INR dose fall below goal range for a few hours -follow for s/s bleeding -follow for GOC and clinical progression  Airyana Sprunger D. Kendale Rembold, PharmD, BCPS Clinical Pharmacist Pager: [phone] 02/16/2015 8:00 AM

## 2015-02-16 NOTE — Progress Notes (Signed)
ANTICOAGULATION CONSULT NOTE - Follow Up Consult  Pharmacy Consult for warfarin Indication: History of PE/DVT  Allergies  Allergen Reactions  . Risperdal [Risperidone] Other (See Comments)    sedation    Patient Measurements: Height: 5\' 2"  (157.5 cm) Weight: 110 lb 3.7 oz (50 kg) IBW/kg (Calculated) : 50.1   Vital Signs: Temp: 97.8 F (36.6 C) (04/26 0751) Temp Source: Axillary (04/26 0751) BP: 176/92 mmHg (04/26 0751) Pulse Rate: 118 (04/26 0751)  Labs:  Recent Labs  02/15/15 1140 02/15/15 1158 02/16/15 0315  HGB 10.9* 12.2 10.2*  HCT 34.1* 36.0 32.1*  PLT 326  --  376  APTT 37  --   --   LABPROT 25.4*  --  25.4*  INR 2.29*  --  2.29*  CREATININE 0.59 0.40* 0.40*  TROPONINI <0.03  --   --     Estimated Creatinine Clearance: 47.2 mL/min (by C-G formula based on Cr of 0.4).   Assessment: [age] YOF here with presumed status epilepticus, started on Keppra. She was on warfarin PTA for history of PE/DVT (home dose: 5mg  daily), but this was held on admission as she was NPO. We were going to start Lovenox when INR was <2, however now patient has a diet ordered and speech is going to evaluate her ability to swallow (not yet done as patient in MRI this afternoon).  INR 2.29. Hgb 10.2, plts 376- no bleeding noted.   Goal of Therapy:  Anti-Xa level 0.6-1 units/ml 4hrs after LMWH dose given Monitor platelets by anticoagulation protocol: Yes   Plan:  -warfarin 5mg  po x1 tonight -if patient does not pass swallow eval, can consider starting treatment dose Lovenox tonight or in the morning (anticipate INR to trend down to ~2 by the morning) -daily INR -follow for s/s bleeding  Calob Baskette D. Meria Crilly, PharmD, BCPS Clinical Pharmacist Pager: [phone] 02/16/2015 2:26 PM

## 2015-02-16 NOTE — Progress Notes (Signed)
SLP Cancellation Note  Patient Details Name: Sabrina Long MRN: [id number] DOB: Apr 04, 1936   Cancelled treatment:       Reason Eval/Treat Not Completed: Patient at procedure or test/unavailable - pt being taken for MRI. Per RN, pt consumed breakfast this am without difficulties, although she did fatigue quickly. Family present reports that at home she eats chopped foods and thin liquids. RN to page SLP when pt returns from imaging to complete swallow evaluation.   Sabrina Long, Sabrina Long  Sabrina Long, Sabrina Long 02/16/2015, 11:30 AM

## 2015-02-16 NOTE — Consult Note (Addendum)
WOC wound consult note Reason for Consult: Consult requested for multiple wounds. Family at bedside states they are all chronic and she is [facility] when at home.  Vac therapy was ordered to left hip wound but pt became ill before it could be applied at home.  They have approval and the machine is scheduled to be delivered at home soon. Pt is emeciated and has multiple systemic factors which can impair healing. Wound type: Left hip with chronic stage 3 wound; 2X2.5X1cm with tunneling at 6:00 o'clock to 5 cm and 2 cm at 12:00 o'clock. 100% red wound bed, mod amt pink drainage, no odor. Sacrum with chronic stage 3 wound; 1X1X.3cm, 100% dark red wound bed, mod amt pink drainage, no odor.  Undermining to .2cm surrounding wound bed. Left outer ankle with stage 1 wound, .2X.2cm, red nonblanchable skin without open wound or drainage. Right outer ankle with chronic stage 3 wound; 1X1X.3cm,100% dark red wound bed, mod amt pink drainage, no odor.    Pressure Ulcer POA: Yes Dressing procedure/placement/frequency: Alginate to absorb drainage and promote healing to right ankle and sacrum wounds, foam dressing to protect from further injury and promote healing.  Air mattress to reduce pressure.  Applied one piece black sponge to left hip for Negative pressure and pt tolerated with minimal discomfort.  Cont suction on at 125mm.  Plan to change Friday.  Pt can switch to home machine upon discharge.  Discussed plan of care with family members and they verbalize understanding. Sabrina Mcgeeawn Irelynn Schermerhorn MSN, RN, CWOCN, [place]AP, CNS [phone]

## 2015-02-16 NOTE — Progress Notes (Addendum)
INITIAL NUTRITION ASSESSMENT  DOCUMENTATION CODES Per approved criteria  -Severe malnutrition in the context of chronic illness   Pt meets criteria for severe MALNUTRITION in the context of chronic illness as evidenced by 20.2% wt loss x 6 months, <75% estimated energy intake x 1 month.  INTERVENTION: -Ensure Enlive po TID, each supplement provides 350 kcal and 20 grams of protein  NUTRITION DIAGNOSIS: Increased nutrient needs related to wound healing as evidenced by estimated needs.   Goal: Pt will meet >90% of estimated nutritional needs  Monitor:  PO/supplement intake, labs, weight changes, I/O's  Reason for Assessment: Consult to assess needs  79 y.o. female  Admitting Dx: Acute encephalopathy  Patient is 79 year old female with end-stage dementia, nonverbal and bedbound at baseline, history of pulmonary embolus and DVTs on Coumadin, brought to [facility] emergency department after an apparent episode of seizure at home. Please note that patient is unable to provide any history due to advanced dementia and post ictal state, no family available at bedside at the time of this interview. Most of the information obtained from emergency room physician and available records. Patient's son was present in the room earlier in the day and reported sudden onset of confusion and generalized body stiffening  ASSESSMENT: Pt admitted with acute encephalopathy.  Pt was down for a procedure at time of visit. No family available.  Spoke with RN who reports that pt has been very lethargic since admission and ate very little this AM. She relayed that that pt is very frail appearing, with a bony appearance. She reports that family revealed that pt has a good appetite and eats very well at home. Diet PTA is mechanical soft with thin liquids. RN reports family was very anxious for pt to eat and hopeful that diet will be advanced once speech evaluates. Pt requires feeding assistance and tends to eat  better with encouragement from family.  Meal tray at bedside revealed pt consumed 50% of nectar thick milk, 3 spoonfuls of grits and 2 bites of eggs.  RN reports that pt came from home and developed wounds at facility- pt family is actively working to heal wounds. RD will add supplements to optimize nutritional intake.  Wt hx reveals significant wt loss over the past 6 months- 28# (20.2%) within the last 6 months.  Palliative care and SLP evaluations pending.  Labs reviewed. K: 3.3, BUN/Creat: <5/0.40, Glucose: 100. CBGS: 89-110.  Height: Ht Readings from Last 1 Encounters:  02/15/15 5\' 2"  (1.575 m)    Weight: Wt Readings from Last 1 Encounters:  02/15/15 110 lb 3.7 oz (50 kg)    Ideal Body Weight: 110#  % Ideal Body Weight: 100%  Wt Readings from Last 10 Encounters:  02/15/15 110 lb 3.7 oz (50 kg)  07/27/14 138 lb (62.596 kg)  04/30/14 145 lb (65.772 kg)  04/02/14 134 lb (60.782 kg)  02/17/14 135 lb 8 oz (61.462 kg)  01/22/14 145 lb 12.8 oz (66.134 kg)  12/22/13 147 lb 3.2 oz (66.769 kg)  05/12/13 150 lb 6.4 oz (68.221 kg)  05/05/13 153 lb 10.6 oz (69.7 kg)  03/20/13 153 lb (69.4 kg)    Usual Body Weight: 145#  % Usual Body Weight: 76%  BMI:  Body mass index is 20.16 kg/(m^2). Normal weight range  Estimated Nutritional Needs: Kcal: 1800-2000 Protein: 90-100 grams Fluid: 1.8-2.0 L  Skin: stage III pressure ulcer on rt ankle, stage I pressure ulcer on rt ankle, stage III pressure ulcer on rt hip, stage III  pressure ulcer on lt hip (with wound vac)  Diet Order: DIET - DYS 1 Room service appropriate?: Yes; Fluid consistency:: Nectar Thick  EDUCATION NEEDS: -Education not appropriate at this time   Intake/Output Summary (Last 24 hours) at 02/16/15 1223 Last data filed at 02/16/15 0600  Gross per 24 hour  Intake    490 ml  Output      0 ml  Net    490 ml    Last BM: 02/15/15  Labs:   Recent Labs Lab 02/15/15 1140 02/15/15 1158 02/16/15 0315  NA 140 142  140  K 3.6 3.6 3.3*  CL 106 104 106  CO2 21  --  25  BUN <5* 4* <5*  CREATININE 0.59 0.40* 0.40*  CALCIUM 9.3  --  9.7  GLUCOSE 182* 186* 100*    CBG (last 3)   Recent Labs  02/15/15 1748 02/15/15 2123 02/16/15 0813  GLUCAP 110* 110* 89    Scheduled Meds: . diclofenac sodium  4 g Topical QID  . feeding supplement (ENSURE)  1 Container Oral TID BM  . insulin aspart  0-9 Units Subcutaneous TID WC  . levETIRAcetam  500 mg Intravenous Q12H  . losartan  25 mg Oral Daily  . metoprolol tartrate  12.5 mg Oral BID  . pantoprazole  40 mg Oral QAC breakfast  . potassium chloride  20 mEq Oral Once  . rosuvastatin  10 mg Oral q1800    Continuous Infusions:   Past Medical History  Diagnosis Date  . Unspecified constipation   . Anxiety state, unspecified   . Colitis, enteritis, and gastroenteritis of presumed infectious origin 11/02/2011  . Pneumonia, organism unspecified 11/02/2011  . Cystitis, unspecified   . Impetigo   . Renal artery stenosis   . Dementia in conditions classified elsewhere with behavioral disturbance   . Hypercalcemia   . Reflux esophagitis   . Chest pain, unspecified   . Abnormality of gait   . Hyperosmolality and/or hypernatremia   . Unspecified disorder of liver   . Candidiasis of vulva and vagina   . Alzheimer's disease   . Chest pain, unspecified 08/19/2008  . Dementia in conditions classified elsewhere without behavioral disturbance   . Depressive disorder, not elsewhere classified   . Esophageal reflux   . Other and unspecified hyperlipidemia   . Loss of weight   . Unspecified persistent mental disorders due to conditions classified elsewhere   . Reactive confusion   . Unspecified essential hypertension   . Other seborrheic keratosis   . Osteoarthrosis, unspecified whether generalized or localized, unspecified site   . Stiffness of joint, not elsewhere classified, unspecified site   . Memory loss 07/24/2007  . Headache(784.0)   .  Undiagnosed cardiac murmurs     Past Surgical History  Procedure Laterality Date  . Cesarean section      Laiklyn Pilkenton A. Mayford Knife, RD, LDN, CDE Pager: [phone] After hours Pager: [phone]

## 2015-02-16 NOTE — Progress Notes (Signed)
   02/15/15 2317  Vitals  BP (!) 181/81 mmHg   Paged K Kirby about pt BP above new orders received will continue to monitor.

## 2015-02-16 NOTE — Progress Notes (Signed)
[facility] PROGRESS NOTE  GRADY LUCCI ZOX:[id number] DOB: May 05, 1936 DOA: 02/15/2015 PCP: Bufford Spikes, DO  Brief Summary  Patient is 79 year old female with end-stage dementia, nonverbal and bedbound at baseline, history of pulmonary embolus and DVTs on Coumadin, brought to [facility] emergency department after an apparent episode of seizure at home. The patient's son was present in her room on the day of admission and noticed sudden onset of confusion and generalized body stiffening.  No reported fevers or chills, no reported abdominal or urinary concerns or shortness of breath.  In emergency department, she was minimally responsive to sternal rub, vital signs stable, blood work unremarkable.  She had about 8 seizures, all about 1 minute long.  She received 1 dose of Versed in emergency department.  She was able to protect her airway and was started on Keppra and admitted to the hospitalist service.    Assessment/Plan  Acute encephalopathy,  Likely secondary to status epilepticus , seizure-free since admission -  Continue Keppra twice a day -  Currently no signs of underlying infection -  CT scan without evidence of  Hemorrhage or stroke -  We will attempt MRI brain -   Seizure precautions  Alzheimer's disease, advanced and pt with significant deconditioning -   Currently bedbound with multiple pressure ulcers -   Nonverbal -  Palliative care consult  Possible dysphasia -  Speech therapy consultation -   Start dysphagia 1 diet with nectar thick liquids pending speech eval   Severe protein calorie malnutrition -   No diet restrictions, start supplements -   Nrition consult  Hyperlipidemia with target LDL less than 100 , resume statin  Hyperglycemia , hemoglobin A1c 5.5, discontinue sliding scale insulin  Bilateral pulmonary embolism, history of DVT -  Resume Coumadin  -  INR therapeutic, repeat in a.m.   Essential hypertension with elevated blood pressures -  Blood pressure stable on admission, 126/64 -  Resume home blood pressure medications -   Discontinue hydralazine secondary to sinus tachycardia -   Start when necessary clonidine for SBP greater than 180   Functional quadriplegia with multiple stage II pressure ulcers on right ankle, buttocks, stage III/4 on left hip , present at the time of admission - Patient bedbound at baseline due to severe advanced dementia - PT evaluation -  Wound care consultation  Abdominal pain, may be due to urinary retention.  Had several BMs overnight after enema -  UA negative -  LFTs wnl -  Check lipase -   Check PVR and place foley if > retained urine  Hypokalemia, due to poor oral intake -  Start potassium chloride  Normocytic anemia -   Lab Results  Component Value Date   ALT 12 02/15/2015   AST 32 02/15/2015   ALKPHOS 64 02/15/2015   BILITOT 0.3 02/15/2015    Diet:   Dysphagia 1 with nectar thick Access:  PIV IVF:   off Proph:  lovenox (therapeutic dose) /therapeutic INR  Code Status: partial:  Call rapid response yes, perform CPR no, perform intubation or mechanical ventilation yes, BiPAP yes, ACLS medications yes, cardioversion no Family Communication:  Spoke with son  And addressed CODE STATUS again this morning. He is adamant that he would want her intubated for at least a Shelbie Franken period of time if needed. Palliative care consulted to continue to readdress. I recommended that we not attempt intubation or mechanical ventilation in the intensive care unit because of her frailness.  Disposition: family would  like to take her home at the time of discharge   Consultants:  Palliative care  Procedures:  Chest x-ray  CT head: No acute change  Antibiotics:  None   HPI/Subjective:   patient is nonverbal however she nods her head yes when asked if she has abdominal pain  Objective: Filed Vitals:   02/15/15 2317 02/16/15 0125 02/16/15 0250 02/16/15 0751  BP: 181/81 157/84  180/99 176/92  Pulse: 89 139 131 118  Temp:   98.7 F (37.1 C)   TempSrc:   Axillary   Resp: 17 22 27 22   Height:      Weight:      SpO2: 98% 98% 96% 98%    Intake/Output Summary (Last 24 hours) at 02/16/15 0827 Last data filed at 02/16/15 0600  Gross per 24 hour  Intake    490 ml  Output      0 ml  Net    490 ml   Filed Weights   02/15/15 1922  Weight: 50 kg (110 lb 3.7 oz)    Exam:   General:  Frail, cachectic female, lying on left side in bed , curled in fetal position with pillow between her legs  HEENT:  NCAT, MMM  Cardiovascular:   Tachycardic, regular rhythm, 2+ pulses, warm extremities  Respiratory:  CTAB, no increased WOB  Abdomen:   NABS, soft,  Nondistended, patient trying to sit up during my exam and so abdominal muscles are tensed , however she does not appear to have any true guarding or tenderness to palpation. Unable to determine whether her bladder feels distended  MSK:    Significantly decreased muscle tone and bulk,  No lower extremity edema  Neuro:   No facial asymmetry, strength 3 out of 5 upper extremities , she minimally moves her lower extremities during examination  Data Reviewed: Basic Metabolic Panel:  Recent Labs Lab 02/15/15 1140 02/15/15 1158 02/16/15 0315  NA 140 142 140  K 3.6 3.6 3.3*  CL 106 104 106  CO2 21  --  25  GLUCOSE 182* 186* 100*  BUN <5* 4* <5*  CREATININE 0.59 0.40* 0.40*  CALCIUM 9.3  --  9.7   Liver Function Tests:  Recent Labs Lab 02/15/15 1140  AST 32  ALT 12  ALKPHOS 64  BILITOT 0.3  PROT 7.0  ALBUMIN 3.0*   No results for input(s): LIPASE, AMYLASE in the last 168 hours. No results for input(s): AMMONIA in the last 168 hours. CBC:  Recent Labs Lab 02/15/15 1140 02/15/15 1158 02/16/15 0315  WBC 7.1  --  10.1  NEUTROABS 5.1  --   --   HGB 10.9* 12.2 10.2*  HCT 34.1* 36.0 32.1*  MCV 94.2  --  92.2  PLT 326  --  376   Cardiac Enzymes:  Recent Labs Lab 02/15/15 1140  TROPONINI <0.03    BNP (last 3 results) No results for input(s): BNP in the last 8760 hours.  ProBNP (last 3 results) No results for input(s): PROBNP in the last 8760 hours.  CBG:  Recent Labs Lab 02/15/15 1146 02/15/15 1748 02/15/15 2123  GLUCAP 157* 110* 110*    Recent Results (from the past 240 hour(s))  MRSA PCR Screening     Status: None   Collection Time: 02/15/15  7:14 PM  Result Value Ref Range Status   MRSA by PCR NEGATIVE NEGATIVE Final    Comment:        The GeneXpert MRSA Assay (FDA approved for NASAL specimens  only), is one component of a comprehensive MRSA colonization surveillance program. It is not intended to diagnose MRSA infection nor to guide or monitor treatment for MRSA infections.      Studies: Ct Head Wo Contrast  02/15/2015   CLINICAL DATA:  Seizures.  Late stage Alzheimer's disease.  EXAM: CT HEAD WITHOUT CONTRAST  TECHNIQUE: Contiguous axial images were obtained from the base of the skull through the vertex without intravenous contrast.  COMPARISON:  07/31/2014.  FINDINGS: No mass lesion, mass effect, midline shift, hydrocephalus, hemorrhage. No acute territorial cortical ischemia/infarct. Atrophy and chronic ischemic white matter disease is present. Paranasal sinuses are within normal limits. Intracranial atherosclerosis.  IMPRESSION: Atrophy and chronic ischemic white matter disease without acute intracranial abnormality.   Electronically Signed   By: Andreas Newport M.D.   On: 02/15/2015 12:51   Dg Chest Portable 1 View  02/15/2015   CLINICAL DATA:  Seizures.  EXAM: PORTABLE CHEST - 1 VIEW  COMPARISON:  CT chest 07/27/2014.  PA and lateral chest 05/03/2013.  FINDINGS: The lungs are clear. Heart size is upper normal. No pneumothorax or pleural effusion.  IMPRESSION: No acute disease.   Electronically Signed   By: Drusilla Kanner M.D.   On: 02/15/2015 13:08    Scheduled Meds: . insulin aspart  0-9 Units Subcutaneous TID WC  . levETIRAcetam  500 mg Intravenous  Q12H   Continuous Infusions:   Principal Problem:   Acute encephalopathy Active Problems:   Hyperlipidemia with target LDL less than 100   FTT (failure to thrive) in adult   Alzheimer's disease   Bilateral pulmonary embolism   Essential hypertension   Status epilepticus    Time spent: 30 min    Makenzie Weisner, [facility]  [facility] Pager [phone]. If 7PM-7AM, please contact night-coverage at [email or web address], password [facility] 02/16/2015, 8:27 AM  LOS: 1 day

## 2015-02-17 DIAGNOSIS — E43 Unspecified severe protein-calorie malnutrition: Secondary | ICD-10-CM

## 2015-02-17 DIAGNOSIS — Z515 Encounter for palliative care: Secondary | ICD-10-CM

## 2015-02-17 DIAGNOSIS — I2699 Other pulmonary embolism without acute cor pulmonale: Secondary | ICD-10-CM

## 2015-02-17 LAB — BASIC METABOLIC PANEL
Anion gap: 7 (ref 5–15)
BUN: <5 mg/dL — ABNORMAL LOW (ref 6–23)
CO2: 24 mmol/L (ref 19–32)
CREATININE: 0.43 mg/dL — AB (ref 0.50–1.10)
Calcium: 9.3 mg/dL (ref 8.4–10.5)
Chloride: 108 mmol/L (ref 96–112)
GFR calc non Af Amer: >90 mL/min (ref >90)
Glucose, Bld: 88 mg/dL (ref 70–99)
Potassium: 3.8 mmol/L (ref 3.5–5.1)
Sodium: 139 mmol/L (ref 135–145)

## 2015-02-17 LAB — CBC
HCT: 29.3 % — ABNORMAL LOW (ref 36.0–46.0)
Hemoglobin: 9.3 g/dL — ABNORMAL LOW (ref 12.0–15.0)
MCH: 29.2 pg (ref 26.0–34.0)
MCHC: 31.7 g/dL (ref 30.0–36.0)
MCV: 92.1 fL (ref 78.0–100.0)
Platelets: 303 10*3/uL (ref 150–400)
RBC: 3.18 MIL/uL — ABNORMAL LOW (ref 3.87–5.11)
RDW: 15.4 % (ref 11.5–15.5)
WBC: 6.6 10*3/uL (ref 4.0–10.5)

## 2015-02-17 LAB — GLUCOSE, CAPILLARY
GLUCOSE-CAPILLARY: 79 mg/dL (ref 70–99)
GLUCOSE-CAPILLARY: 152 mg/dL — AB (ref 70–99)
Glucose-Capillary: 129 mg/dL — ABNORMAL HIGH (ref 70–99)
Glucose-Capillary: 114 mg/dL — ABNORMAL HIGH (ref 70–99)

## 2015-02-17 LAB — PROTIME-INR
INR: 2.39 — AB (ref 0.00–1.49)
Prothrombin Time: 26.3 seconds — ABNORMAL HIGH (ref 11.6–15.2)

## 2015-02-17 MED ORDER — WARFARIN SODIUM 5 MG PO TABS
5.0000 mg | ORAL_TABLET | Freq: Once | ORAL | Status: AC
  Administered 2015-02-17: 5 mg via ORAL
  Filled 2015-02-17: qty 1

## 2015-02-17 MED ORDER — LEVETIRACETAM 500 MG PO TABS
500.0000 mg | ORAL_TABLET | Freq: Two times a day (BID) | ORAL | Status: DC
Start: 2015-02-17 — End: 2015-08-11

## 2015-02-17 MED ORDER — ENSURE ENLIVE PO LIQD: 237.0000 mL | Freq: Three times a day (TID) | ORAL | Status: DC

## 2015-02-17 MED ORDER — STARCH (THICKENING) PO POWD
ORAL | Status: DC
Start: 2015-02-17 — End: 2015-02-17

## 2015-02-17 NOTE — Evaluation (Signed)
Physical Therapy Evaluation Patient Details Name: Sabrina Sabrina [name] MRN: [id number] DOB: 03/28/1936 Today's Date: Sabrina [name]   History of Present Illness  Patient is 79 year old female with end-stage dementia, nonverbal and bedbound at baseline, history of pulmonary embolus and DVTs on Coumadin, brought to [facility] emergency department after an apparent episode of seizure at home  Clinical Impression  Enjoyed evaluating this sweet lady, but we do not have anything to offer her for improving function.  She has contractures on all 4, but they are mild to moderate and she can be ranged and positioned comfortably.  General mobility, ie rolling and sitting up EOB can be managed fairly easily by family.  It appears that they have all the appropriate equipment for mobilizing, safety or pressure/skin management.  Will sign off at this time.     Follow Up Recommendations No PT follow up    Equipment Recommendations   (per case management, family has all necessary equip.)    Recommendations for Other Services       Precautions / Restrictions        Mobility  Bed Mobility Overal bed mobility: Needs Assistance Bed Mobility: Rolling;Sidelying to Sit;Sit to Sidelying Rolling: Max assist Sidelying to sit: Total assist     Sit to sidelying: Total assist General bed mobility comments: total assist to move to EOB from supine  Transfers                 General transfer comment: not appropriate at this time due to pt bedbound.  Ambulation/Gait             General Gait Details: not applicable  Stairs            Wheelchair Mobility    Modified Rankin (Stroke Patients Only)       Balance Overall balance assessment: Needs assistance Sitting-balance support: Bilateral upper extremity supported Sitting balance-Leahy Scale: Poor Sitting balance - Comments: sat EOB x2-3 minutes to check her truncal reactions and balance.  No attempt to right herself or maintain  balance, but easy to haold patient in a sitting position.                                     Pertinent Vitals/Pain Pain Assessment: Faces Faces Pain Scale: Hurts little more Pain Location: some signs of pain with strethch of biceps and hams. Pain Descriptors / Indicators: Grimacing Pain Intervention(s): Limited activity within patient's tolerance    Home Living Family/patient expects to be discharged to:: Private residence Living Arrangements: Children Available Help at Discharge: Family;Available 24 hours/day Type of Home: House       Home Layout: One level Home Equipment: Emergency planning/management officerhower seat;Walker - 2 wheels      Prior Function Level of Independence: Needs assistance         Comments: Pt is bedbound.  She is moved/sat up during dressing bathing, but generally lying in bed 23 or 24 hour/day.  Pt's son states he can transfer her easily as needed and there are 6 siblings that take turns caring for their Mom.     Hand Dominance        Extremity/Trunk Assessment   Upper Extremity Assessment: RUE deficits/detail;LUE deficits/detail RUE Deficits / Details: flexion and extension contractures, some voluntary movements in responses to ROM     LUE Deficits / Details: flexion and extension contractures at the elbow and shoulders.  minimal voluntary movement in a  small range.   Lower Extremity Assessment: RLE deficits/detail;LLE deficits/detail RLE Deficits / Details: Flexion contractures of Hams and hip flexors/ab/adductors LLE Deficits / Details: Flexion contractures of Hams and hip flexors/ab/adductors     Communication   Communication:  (non verbal generally)  Cognition Arousal/Alertness: Awake/alert Behavior During Therapy: Flat affect Overall Cognitive Status: History of cognitive impairments - at baseline                      General Comments      Exercises Other Exercises Other Exercises: PROM and mild stretch of contractures to all 4's       Assessment/Plan    PT Assessment Patent does not need any further PT services  PT Diagnosis  (Contractures)   PT Problem List    PT Treatment Interventions     PT Goals (Current goals can be found in the Care Plan section) Acute Rehab PT Goals PT Goal Formulation: Patient unable to participate in goal setting (no goals set after assessment)    Frequency     Barriers to discharge        Co-evaluation               End of Session   Activity Tolerance: Patient tolerated treatment well Patient left: in bed;with call bell/phone within reach Nurse Communication: Mobility status         Time: 9604-5409 PT Time Calculation (min) (ACUTE ONLY): 19 min   Charges:   PT Evaluation $Initial PT Evaluation Tier I: 1 Procedure     PT G Codes:        Sabrina Sabrina [name], Sabrina Sabrina [name], 4:25 PM  Sabrina [name]  Sabrina Sabrina [name], PT [phone]  (pager)

## 2015-02-17 NOTE — Consult Note (Signed)
Patient Sabrina [name]      DOB: 07-Oct-1936      VZD:[id number]     Consult Note from the Palliative Medicine Team at [facility]    Consult Requested by: Dr Roda ShuttersXu     PCP: Bufford SpikesEED, [name], DO Reason for Consultation:Clarification of GOC and options     Phone Number:[phone]  Assessment of patients Current state:   Continued physical, functional and cognitive decline 2/2 to ES-dementia.  Main focus of care is comfort. Family has recent loss of patient's husband/father 5 days ago due to ALS.  Difficult time for all.  Consult is for review of medical treatment options, clarification of goals of care and end of life issues, disposition and options, and symptom recommendation.  This NP Lorinda CreedMary Larach reviewed medical records, received report from team, assessed the patient and then meet at the patient's bedside along with her son Rande LawmanBrian Willhelm to discuss diagnosis, prognosis, GOC, EOL wishes disposition and options.  A detailed discussion was had today regarding advanced directives.  Concepts specific to code status, artifical feeding and hydration, continued IV antibiotics and rehospitalization was had.  The difference between a aggressive medical intervention path  and a palliative comfort care path for this patient at this time was had.  Values and goals of care important to patient and family were attempted to be elicited.  Concept of Hospice and Palliative Care were discussed  Natural trajectory and expectations at EOL were discussed.  Questions and concerns addressed.  Hard Choices booklet left for review. Family encouraged to call with questions or concerns.  PMT will continue to support holistically.   Goals of Care: 1.  Code Status: DNR/DNI   Focus of care is comfort at home with family as caregivers.  Treat the treatable, no desire for artifical feeding now or in the future  2. Psychosocial:  Emotional support to DaisyBrian at bedside.  He shares that his father died last  Friday from ALS.  He shares loving memories of both his parents and of a strong family bond.  3. Disposition:  Home with Home Health services, family declines hospice referral at this time.  Six siblings care for this patient around th clock, each taking shifts.  [facility] nursing involved for wound care    Brief HPI: Patient is 79 year old female with end-stage dementia, nonverbal and bedbound at baseline, history of pulmonary embolus and DVTs on Coumadin, brought to [facility] emergency department after an apparent episode of seizure at home. The patient's son was present in her room on the day of admission and noticed sudden onset of confusion and generalized body stiffening. No reported fevers or chills, no reported abdominal or urinary concerns or shortness of breath. In emergency department, she was minimally responsive to sternal rub, vital signs stable, blood work unremarkable. She had about 8 seizures, all about 1 minute long. She received 1 dose of Versed in emergency department. She was able to protect her airway and was started on Keppra and admitted to the hospitalist service.  Admitted and stabilized, now at baseline per her son. Overall failure to thrive, overall poor prognosis   ROS: unable to illicit due to decreased cognition   PMH:  Past Medical History  Diagnosis Date  . Unspecified constipation   . Anxiety state, unspecified   . Colitis, enteritis, and gastroenteritis of presumed infectious origin 11/02/2011  . Pneumonia, organism unspecified 11/02/2011  . Cystitis, unspecified   . Impetigo   . Renal artery stenosis   . Dementia  in conditions classified elsewhere with behavioral disturbance   . Hypercalcemia   . Reflux esophagitis   . Chest pain, unspecified   . Abnormality of gait   . Hyperosmolality and/or hypernatremia   . Unspecified disorder of liver   . Candidiasis of vulva and vagina   . Alzheimer's disease   . Chest pain, unspecified 08/19/2008  .  Dementia in conditions classified elsewhere without behavioral disturbance   . Depressive disorder, not elsewhere classified   . Esophageal reflux   . Other and unspecified hyperlipidemia   . Loss of weight   . Unspecified persistent mental disorders due to conditions classified elsewhere   . Reactive confusion   . Unspecified essential hypertension   . Other seborrheic keratosis   . Osteoarthrosis, unspecified whether generalized or localized, unspecified site   . Stiffness of joint, not elsewhere classified, unspecified site   . Memory loss 07/24/2007  . Headache(784.0)   . Undiagnosed cardiac murmurs      PSH: Past Surgical History  Procedure Laterality Date  . Cesarean section     I have reviewed the FH and SH and  If appropriate update it with new information. Allergies  Allergen Reactions  . Risperdal [Risperidone] Other (See Comments)    sedation   Scheduled Meds: . diclofenac sodium  4 g Topical QID  . feeding supplement (ENSURE ENLIVE)  237 mL Oral TID BM  . feeding supplement (ENSURE)  1 Container Oral TID BM  . levETIRAcetam  500 mg Intravenous Q12H  . losartan  25 mg Oral Daily  . metoprolol tartrate  12.5 mg Oral BID  . pantoprazole  40 mg Oral QAC breakfast  . rosuvastatin  10 mg Oral q1800  . warfarin  5 mg Oral ONCE-1800  . Warfarin - Pharmacist Dosing Inpatient   Does not apply q1800   Continuous Infusions:  PRN Meds:.ALPRAZolam, cloNIDine, food thickener, ondansetron **OR** ondansetron (ZOFRAN) IV    BP 134/67 mmHg  Pulse 107  Temp(Src) 98.8 F (37.1 C) (Oral)  Resp 21  Ht  (1.575 m)  Wt 50 kg (110 lb 3.7 oz)  BMI 20.16 kg/m2  SpO2 98%   PPS:30 % at best   Intake/Output Summary (Last 24 hours) at 02/17/15 1241 Last data filed at 02/16/15 2101  Gross per 24 hour  Intake    100 ml  Output      0 ml  Net    100 ml     Physical Exam:  General: chronically ill appearing, non-verbal HEENT:  Moist buccal membranes Chest:    Decreased in bases CVS: tachycardic Abdomen:soft NT +BS Skin: noted noted on skin breakdown   Labs: CBC    Component Value Date/Time   WBC 6.6 02/17/2015 0225   WBC 6.1 12/18/2014 1057   RBC 3.18* 02/17/2015 0225   RBC 3.84 12/18/2014 1057   HGB 9.3* 02/17/2015 0225   HCT 29.3* 02/17/2015 0225   PLT 303 02/17/2015 0225   MCV 92.1 02/17/2015 0225   MCH 29.2 02/17/2015 0225   MCH 29.9 12/18/2014 1057   MCHC 31.7 02/17/2015 0225   MCHC 31.9 12/18/2014 1057   RDW 15.4 02/17/2015 0225   RDW 15.5* 12/18/2014 1057   LYMPHSABS 1.7 02/15/2015 1140   LYMPHSABS 2.2 12/18/2014 1057   MONOABS 0.3 02/15/2015 1140   EOSABS 0.0 02/15/2015 1140   EOSABS 0.0 12/18/2014 1057   BASOSABS 0.1 02/15/2015 1140   BASOSABS 0.1 12/18/2014 1057    BMET    Component Value Date/Time  NA 139 02/17/2015 0225   NA 143 12/18/2014 1057   K 3.8 02/17/2015 0225   CL 108 02/17/2015 0225   CO2 24 02/17/2015 0225   GLUCOSE 88 02/17/2015 0225   GLUCOSE 206* 12/18/2014 1057   BUN <5* 02/17/2015 0225   BUN 12 12/18/2014 1057   CREATININE 0.43* 02/17/2015 0225   CALCIUM 9.3 02/17/2015 0225   GFRNONAA >90 02/17/2015 0225   GFRAA >90 02/17/2015 0225    CMP     Component Value Date/Time   NA 139 02/17/2015 0225   NA 143 12/18/2014 1057   K 3.8 02/17/2015 0225   CL 108 02/17/2015 0225   CO2 24 02/17/2015 0225   GLUCOSE 88 02/17/2015 0225   GLUCOSE 206* 12/18/2014 1057   BUN <5* 02/17/2015 0225   BUN 12 12/18/2014 1057   CREATININE 0.43* 02/17/2015 0225   CALCIUM 9.3 02/17/2015 0225   PROT 7.0 02/15/2015 1140   PROT 6.2 12/18/2014 1057   ALBUMIN 3.0* 02/15/2015 1140   AST 32 02/15/2015 1140   ALT 12 02/15/2015 1140   ALKPHOS 64 02/15/2015 1140   BILITOT 0.3 02/15/2015 1140   BILITOT <0.2 12/18/2014 1057   GFRNONAA >90 02/17/2015 0225   GFRAA >90 02/17/2015 0225      Time In Time Out Total Time Spent with Patient Total Overall Time  0930 1045 70 min 75 min    Greater than 50%  of  this time was spent counseling and coordinating care related to the above assessment and plan.   Lorinda Creed NP  Palliative Medicine Team Team Phone # [phone] Pager [phone]  Discussed with Dr Roda Shutters

## 2015-02-17 NOTE — Progress Notes (Signed)
Has not voided since early am.  Has two BM's soft brown.  Bladder scan done with 216 cc's in bladder.  Dr. Roda ShuttersXU notified and will continue to watch patient.

## 2015-02-17 NOTE — Evaluation (Signed)
Clinical/Bedside Swallow Evaluation Patient Details  Name: Sabrina [name] MRN: [id number] Date of Birth: 1936-09-30  Today's Date: 02/17/2015 Time: SLP Start Time (ACUTE ONLY): [id number] SLP Stop Time (ACUTE ONLY): 0948 SLP Time Calculation (min) (ACUTE ONLY): 21 min  Past Medical History:  Past Medical History  Diagnosis Date  . Unspecified constipation   . Anxiety state, unspecified   . Colitis, enteritis, and gastroenteritis of presumed infectious origin 11/02/2011  . Pneumonia, organism unspecified 11/02/2011  . Cystitis, unspecified   . Impetigo   . Renal artery stenosis   . Dementia in conditions classified elsewhere with behavioral disturbance   . Hypercalcemia   . Reflux esophagitis   . Chest pain, unspecified   . Abnormality of gait   . Hyperosmolality and/or hypernatremia   . Unspecified disorder of liver   . Candidiasis of vulva and vagina   . Alzheimer's disease   . Chest pain, unspecified 08/19/2008  . Dementia in conditions classified elsewhere without behavioral disturbance   . Depressive disorder, not elsewhere classified   . Esophageal reflux   . Other and unspecified hyperlipidemia   . Loss of weight   . Unspecified persistent mental disorders due to conditions classified elsewhere   . Reactive confusion   . Unspecified essential hypertension   . Other seborrheic keratosis   . Osteoarthrosis, unspecified whether generalized or localized, unspecified site   . Stiffness of joint, not elsewhere classified, unspecified site   . Memory loss 07/24/2007  . Headache(784.0)   . Undiagnosed cardiac murmurs    Past Surgical History:  Past Surgical History  Procedure Laterality Date  . Cesarean section     HPI:  79 year old female with end-stage dementia, nonverbal and bedbound at baseline, history of pulmonary embolus and DVTs on Coumadin, brought [facility] department after an apparent episode of seizure at home. Diagnosed with acute  encephalopathy, s/p epilepticus. Started on dysphagia 1 diet with nectar thick liquids due to possible dysphagia. Per previous SLP note, family reported patient consumes chopped diet, thin liquids at home prior to admission.    Assessment / Plan / Recommendation Clinical Impression  Patient presents with a functional oropharyngeal swallow without overt indication of aspiration. Oral transit delays and intermittent oral holding noted, typical for a progressive dysphagia related to dementia. Son present and supportive, reporting that patient was consuming a chopped diet, thin liquids prior to admission without concerns/evidence of aspiration. Will advance diet. Educated son on general safe swallowing precautions. No skilled SLP f/u indicated at this time.     Aspiration Risk  Moderate    Diet Recommendation Dysphagia 2 (Fine chop);Thin liquid   Liquid Administration via: Cup;Straw Medication Administration: Crushed with puree Supervision: Staff to assist with self feeding;Full supervision/cueing for compensatory strategies Compensations: Slow rate;Small sips/bites;Check for pocketing Postural Changes and/or Swallow Maneuvers: Seated upright 90 degrees    Other  Recommendations Oral Care Recommendations: Oral care BID   Follow Up Recommendations  None       Pertinent Vitals/Pain n/a     Swallow Study    General HPI: 79 year old female with end-stage dementia, nonverbal and bedbound at baseline, history of pulmonary embolus and DVTs on Coumadin, brought to [facility] emergency department after an apparent episode of seizure at home. Diagnosed with acute encephalopathy, s/p epilepticus. Started on dysphagia 1 diet with nectar thick liquids due to possible dysphagia. Per previous SLP note, family reported patient consumes chopped diet, thin liquids at home prior to admission.  Type of Study: Bedside swallow  evaluation Previous Swallow Assessment: none noted Diet Prior to this Study:  Dysphagia 1 (puree);Nectar-thick liquids Temperature Spikes Noted: No Respiratory Status: Room air History of Recent Intubation: No Behavior/Cognition: Alert;Pleasant mood;Distractible;Requires cueing;Doesn't follow directions;Decreased sustained attention Oral Cavity - Dentition: Adequate natural dentition Self-Feeding Abilities: Total assist Patient Positioning: Upright in bed Baseline Vocal Quality: Clear (minimally vocal) Volitional Cough: Cognitively unable to elicit Volitional Swallow: Unable to elicit    Oral/Motor/Sensory Function Overall Oral Motor/Sensory Function: Appears within functional limits for tasks assessed (although unable to formally assess due to cognition)   Ice Chips Ice chips: Within functional limits Presentation: Spoon   Thin Liquid Thin Liquid: Within functional limits Presentation: Cup;Straw    Nectar Thick Nectar Thick Liquid: Not tested   Honey Thick Honey Thick Liquid: Not tested   Puree Puree: Impaired Presentation: Spoon Oral Phase Impairments: Impaired anterior to posterior transit Oral Phase Functional Implications: Prolonged oral transit;Oral holding   Solid   GO    Solid: Impaired Presentation: Spoon Oral Phase Impairments: Impaired anterior to posterior transit Oral Phase Functional Implications: Oral holding (delayed oral transit)      Kennley Schwandt MA, CCC-SLP [phone]  Ellory Khurana Meryl 02/17/2015,9:51 AM

## 2015-02-17 NOTE — Progress Notes (Signed)
[facility] PROGRESS NOTE  Sabrina [name] ZOX:[id number] DOB: 08/25/1936 DOA: 02/15/2015 PCP: Bufford Spikes, DO  Brief Summary  Patient is 79 year old female with end-stage dementia, nonverbal and bedbound at baseline, history of pulmonary embolus and DVTs on Coumadin, brought to [facility] emergency department after an apparent episode of seizure at home. The patient's son was present in her room on the day of admission and noticed sudden onset of confusion and generalized body stiffening.  No reported fevers or chills, no reported abdominal or urinary concerns or shortness of breath.  In emergency department, she was minimally responsive to sternal rub, vital signs stable, blood work unremarkable.  She had about 8 seizures, all about 1 minute long.  She received 1 dose of Versed in emergency department.  She was able to protect her airway and was started on Keppra and admitted to the hospitalist service.    Assessment/Plan  Acute encephalopathy,  Likely secondary to status epilepticus , seizure-free since admission -  Continue Keppra twice a day -  CT scan without evidence of  Hemorrhage or stroke - MRI brain 4/26, no acute findings -   Seizure precautions  Alzheimer's disease, advanced and pt with significant deconditioning -   Currently bedbound with multiple chronic pressure ulcers -   Nonverbal -  Palliative care consult pending  Possible dysphasia -  Speech therapy consultation -   Start dysphagia 1 diet with nectar thick liquids pending speech eval   Severe protein calorie malnutrition -   No diet restrictions, start supplements -   Nrition consult  Hyperlipidemia with target LDL less than 100 , resume statin  Hyperglycemia , hemoglobin A1c 5.5, discontinue sliding scale insulin  Bilateral pulmonary embolism, history of DVT -  Resume Coumadin  -  INR therapeutic, repeat in a.m.   Essential hypertension with elevated blood pressures - Blood pressure stable  on admission, 126/64 -  Resume home blood pressure medications -   Discontinue hydralazine secondary to sinus tachycardia -   Start when necessary clonidine for SBP greater than 180   Functional quadriplegia with multiple stage II pressure ulcers on right ankle, buttocks, stage III/4 on left hip , present at the time of admission - Patient bedbound at baseline due to severe advanced dementia - PT evaluation -  Wound care consultation, wound vac to left hip  Abdominal pain, may be due to urinary retention.  Had several BMs overnight after enema -  UA negative -  LFTs wnl -   Lipase wnl -   Check PVR and place foley if > retained urine -denies pain on /27  Hypokalemia, due to poor oral intake -  Start potassium chloride  Normocytic anemia -   Lab Results  Component Value Date   ALT 12 02/15/2015   AST 32 02/15/2015   ALKPHOS 64 02/15/2015   BILITOT 0.3 02/15/2015    Diet:   Dysphagia 1 with nectar thick Access:  PIV IVF:   off Proph:  lovenox (therapeutic dose) /therapeutic INR  Code Status: partial:  Call rapid response yes, perform CPR no, perform intubation or mechanical ventilation yes, BiPAP yes, ACLS medications yes, cardioversion no Family Communication:  Spoke with son  And addressed CODE STATUS again this morning. He is adamant that he would want her intubated for at least a short period of time if needed. Palliative care consulted to continue to readdress. I recommended that we not attempt intubation or mechanical ventilation in the intensive care unit because of her frailness.  Disposition: family would like to take her home at the time of discharge Pending palliative consult.   Consultants:  Palliative care  Procedures:  Chest x-ray  CT head: No acute change  Antibiotics:  None   HPI/Subjective:   patient is awake, nonverbal, does not seems in pain, son at bedside, states this is close to her baseline.  awaiting speech eval and palliative  team consult.  Objective: Filed Vitals:   02/17/15 0435 02/17/15 0800 02/17/15 1104 02/17/15 1105  BP: 143/84  134/67 134/67  Pulse: 83  108 107  Temp:  98.8 F (37.1 C)    TempSrc:  Oral    Resp: 18   21  Height:      Weight:      SpO2: 97%   98%    Intake/Output Summary (Last 24 hours) at 02/17/15 1315 Last data filed at 02/16/15 2101  Gross per 24 hour  Intake    100 ml  Output      0 ml  Net    100 ml   Filed Weights   02/15/15 1922  Weight: 50 kg (110 lb 3.7 oz)    Exam:   General:  Frail, cachectic female, lying on left side in bed , curled in fetal position with pillow between her legs  HEENT:  NCAT, MMM  Cardiovascular:   Tachycardic, regular rhythm, 2+ pulses, warm extremities  Respiratory:  CTAB, no increased WOB  Abdomen:   NABS, soft,  Nondistended, patient trying to sit up during my exam and so abdominal muscles are tensed , however she does not appear to have any true guarding or tenderness to palpation. Unable to determine whether her bladder feels distended  MSK:    Significantly decreased muscle tone and bulk,  No lower extremity edema  Neuro:   No facial asymmetry, strength 3 out of 5 upper extremities , she minimally moves her lower extremities during examination  Data Reviewed: Basic Metabolic Panel:  Recent Labs Lab 02/15/15 1140 02/15/15 1158 02/16/15 0315 02/17/15 0225  NA 140 142 140 139  K 3.6 3.6 3.3* 3.8  CL 106 104 106 108  CO2 21  --  25 24  GLUCOSE 182* 186* 100* 88  BUN <5* 4* <5* <5*  CREATININE 0.59 0.40* 0.40* 0.43*  CALCIUM 9.3  --  9.7 9.3   Liver Function Tests:  Recent Labs Lab 02/15/15 1140  AST 32  ALT 12  ALKPHOS 64  BILITOT 0.3  PROT 7.0  ALBUMIN 3.0*    Recent Labs Lab 02/16/15 0315  LIPASE 22   No results for input(s): AMMONIA in the last 168 hours. CBC:  Recent Labs Lab 02/15/15 1140 02/15/15 1158 02/16/15 0315 02/17/15 0225  WBC 7.1  --  10.1 6.6  NEUTROABS 5.1  --   --   --   HGB  10.9* 12.2 10.2* 9.3*  HCT 34.1* 36.0 32.1* 29.3*  MCV 94.2  --  92.2 92.1  PLT 326  --  376 303   Cardiac Enzymes:  Recent Labs Lab 02/15/15 1140  TROPONINI <0.03   BNP (last 3 results) No results for input(s): BNP in the last 8760 hours.  ProBNP (last 3 results) No results for input(s): PROBNP in the last 8760 hours.  CBG:  Recent Labs Lab 02/16/15 1451 02/16/15 1621 02/16/15 2137 02/17/15 0913 02/17/15 1124  GLUCAP 65* 122* 151* 79 129*    Recent Results (from the past 240 hour(s))  Urine culture     Status: None  Collection Time: 02/15/15  1:30 PM  Result Value Ref Range Status   Specimen Description URINE, RANDOM  Final   Special Requests NONE  Final   Colony Count NO GROWTH Performed at [facility]   Final   Culture NO GROWTH Performed at [facility]   Final   Report Status 02/16/2015 FINAL  Final  MRSA PCR Screening     Status: None   Collection Time: 02/15/15  7:14 PM  Result Value Ref Range Status   MRSA by PCR NEGATIVE NEGATIVE Final    Comment:        The GeneXpert MRSA Assay (FDA approved for NASAL specimens only), is one component of a comprehensive MRSA colonization surveillance program. It is not intended to diagnose MRSA infection nor to guide or monitor treatment for MRSA infections.      Studies: Mr Brain Wo Contrast  02/16/2015   CLINICAL DATA:  Patient with end-stage dementia and new onset seizures.  EXAM: MRI HEAD WITHOUT CONTRAST  TECHNIQUE: Multiplanar, multiecho pulse sequences of the brain and surrounding structures were obtained without intravenous contrast.  COMPARISON:  02/15/2015.  FINDINGS: No evidence for acute infarction, hemorrhage, mass lesion, or extra-axial fluid. Severe atrophy. Hydrocephalus ex vacuo.  Extensive T2 and FLAIR hyperintensity throughout the periventricular and subcortical white matter representing advanced chronic microvascular ischemic change. Tiny foci of chronic hemorrhage scattered  throughout the brain, nonspecific, but likely sequelae of longstanding hypertensive cerebrovascular disease, given the history of hypertension and renal artery stenosis.  Partial empty sella. No upper cervical lesions or tonsillar herniation. Flow voids are maintained in the carotid, basilar, and vertebral arteries. BILATERAL cataract extraction. No sinus or mastoid air fluid levels.  Thin-section imaging through the temporal lobes demonstrates extreme volume loss without focal mass or inflammatory process.  IMPRESSION: No seizure focus is evident. Severe atrophy with extensive small vessel disease. Good agreement with recent CT scan.   Electronically Signed   By: Davonna Belling M.D.   On: 02/16/2015 14:41    Scheduled Meds: . diclofenac sodium  4 g Topical QID  . feeding supplement (ENSURE ENLIVE)  237 mL Oral TID BM  . feeding supplement (ENSURE)  1 Container Oral TID BM  . levETIRAcetam  500 mg Intravenous Q12H  . losartan  25 mg Oral Daily  . metoprolol tartrate  12.5 mg Oral BID  . pantoprazole  40 mg Oral QAC breakfast  . rosuvastatin  10 mg Oral q1800  . warfarin  5 mg Oral ONCE-1800  . Warfarin - Pharmacist Dosing Inpatient   Does not apply q1800   Continuous Infusions:   Principal Problem:   Acute encephalopathy Active Problems:   Hyperlipidemia with target LDL less than 100   FTT (failure to thrive) in adult   Alzheimer's disease   Bilateral pulmonary embolism   Essential hypertension   Status epilepticus   Protein-calorie malnutrition, severe    Time spent: 30 min    Sabrina [name]  [facility] Pager [phone]. If 7PM-7AM, please contact night-coverage at [email or web address], password [facility] 02/17/2015, 1:15 PM  LOS: 2 days

## 2015-02-17 NOTE — Progress Notes (Signed)
ANTICOAGULATION CONSULT NOTE - Follow Up Consult  Pharmacy Consult for warfarin Indication: History of PE/DVT  Allergies  Allergen Reactions  . Risperdal [Risperidone] Other (See Comments)    sedation    Patient Measurements: Height: 5\' 2"  (157.5 cm) Weight: 110 lb 3.7 oz (50 kg) IBW/kg (Calculated) : 50.1   Vital Signs: Temp: 98.8 F (37.1 C) (04/27 0800) Temp Source: Oral (04/27 0800) BP: 143/84 mmHg (04/27 0435) Pulse Rate: 83 (04/27 0435)  Labs:  Recent Labs  02/15/15 1140 02/15/15 1158 02/16/15 0315 02/17/15 0225  HGB 10.9* 12.2 10.2* 9.3*  HCT 34.1* 36.0 32.1* 29.3*  PLT 326  --  376 303  APTT 37  --   --   --   LABPROT 25.4*  --  25.4* 26.3*  INR 2.29*  --  2.29* 2.39*  CREATININE 0.59 0.40* 0.40* 0.43*  TROPONINI <0.03  --   --   --     Estimated Creatinine Clearance: 47.2 mL/min (by C-G formula based on Cr of 0.43).   Assessment: [age] YOF here with presumed status epilepticus, started on Keppra. She was on warfarin PTA for history of PE/DVT (home dose: 5mg  daily). Patient passed swallow eval and is on a dysphagia 2 diet. Was able to take warfarin last evening.  INR 2.39. Hgb 9.3, plts 303- no bleeding noted.   Goal of Therapy:  Anti-Xa level 0.6-1 units/ml 4hrs after LMWH dose given Monitor platelets by anticoagulation protocol: Yes   Plan:  -warfarin 5mg  po x1 tonight -follow for s/s bleeding  Twylla Arceneaux D. Dilpreet Faires, PharmD, BCPS Clinical Pharmacist Pager: [phone] 02/17/2015 11:07 AM

## 2015-02-18 ENCOUNTER — Other Ambulatory Visit: Payer: Self-pay | Admitting: Internal Medicine

## 2015-02-18 DIAGNOSIS — Z7189 Other specified counseling: Secondary | ICD-10-CM

## 2015-02-18 LAB — GLUCOSE, CAPILLARY: GLUCOSE-CAPILLARY: 85 mg/dL (ref 70–99)

## 2015-02-18 LAB — PROTIME-INR
INR: 2.47 — AB (ref 0.00–1.49)
Prothrombin Time: 26.9 seconds — ABNORMAL HIGH (ref 11.6–15.2)

## 2015-02-18 LAB — BASIC METABOLIC PANEL
ANION GAP: 7 (ref 5–15)
BUN: 6 mg/dL (ref 6–23)
CHLORIDE: 107 mmol/L (ref 96–112)
CO2: 27 mmol/L (ref 19–32)
CREATININE: 0.41 mg/dL — AB (ref 0.50–1.10)
Calcium: 9.2 mg/dL (ref 8.4–10.5)
GFR calc Af Amer: >90 mL/min (ref >90)
Glucose, Bld: 85 mg/dL (ref 70–99)
POTASSIUM: 4.1 mmol/L (ref 3.5–5.1)
SODIUM: 141 mmol/L (ref 135–145)

## 2015-02-18 LAB — CLOSTRIDIUM DIFFICILE BY PCR: Toxigenic C. Difficile by PCR: NEGATIVE

## 2015-02-18 LAB — CBC
HCT: 28.6 % — ABNORMAL LOW (ref 36.0–46.0)
HEMOGLOBIN: 8.9 g/dL — AB (ref 12.0–15.0)
MCH: 29 pg (ref 26.0–34.0)
MCHC: 31.1 g/dL (ref 30.0–36.0)
MCV: 93.2 fL (ref 78.0–100.0)
PLATELETS: 307 10*3/uL (ref 150–400)
RBC: 3.07 MIL/uL — ABNORMAL LOW (ref 3.87–5.11)
RDW: 15.3 % (ref 11.5–15.5)
WBC: 6.7 10*3/uL (ref 4.0–10.5)

## 2015-02-18 NOTE — Discharge Summary (Addendum)
Discharge Summary  Sabrina Long ZOX:[id number] DOB: 1936/03/26  PCP: Bufford SpikesEED, [name], DO  Admit date: 02/15/2015 Discharge date: 02/18/2015  Time spent: <8830mins  Recommendations for Outpatient Follow-up:  1. F/u with PMD within two weeks 2. Continue home health/RN/wound care/INR monitoring 3.         F/u with wound care center on 5/5.  Discharge Diagnoses:  Active Hospital Problems   Diagnosis Date Noted  . Acute encephalopathy 02/17/2014  . DNR (do not resuscitate) discussion 02/17/2015  . Palliative care encounter 02/17/2015  . Protein-calorie malnutrition, severe 02/16/2015  . Status epilepticus   . Essential hypertension 07/29/2014  . Bilateral pulmonary embolism 07/27/2014  . Alzheimer's disease 04/02/2014  . FTT (failure to thrive) in adult 02/17/2014  . Hyperlipidemia with target LDL less than 100 03/20/2013    Resolved Hospital Problems   Diagnosis Date Noted Date Resolved  No resolved problems to display.    Discharge Condition: stable  Diet recommendation: heart healthy Dysphagia 2 (Fine chop);Thin liquid   Liquid Administration via: Cup;Straw Medication Administration: Crushed with puree Supervision: Staff to assist with self feeding;Full supervision/cueing for compensatory strategies Compensations: Slow rate;Small sips/bites;Check for pocketing Postural Changes and/or Swallow Maneuvers: Seated upright 90 degrees   Filed Weights   02/15/15 1922  Weight: 50 kg (110 lb 3.7 oz)    History of present illness:  Patient is 79 year old female with end-stage dementia, nonverbal and bedbound at baseline, history of pulmonary embolus and DVTs on Coumadin, brought to [facility] department after an apparent episode of seizure at home. Please note that patient is unable to provide any history due to advanced dementia and post ictal state, no family available at bedside at the time of this interview. Most of the information obtained from emergency room  physician and available records. Patient's son was present in the room earlier in the day and reported sudden onset of confusion and generalized body stiffening. No reported fevers or chills, no reported abdominal or urinary concerns. No reported shortness of breath.  In emergency department, patient noted to be minimally responsive to sternal rub, vital signs stable, blood work unremarkable, has received 1 dose of Versed in emergency department. Patient able to protect airways and therefore has not required intubation. Emergency room doctor consulted with neurologist on call, recommendation was to start Keppra. TRH asked to admit to step down unit for further evaluation.   Hospital Course:  Principal Problem:   Acute encephalopathy Active Problems:   Hyperlipidemia with target LDL less than 100   FTT (failure to thrive) in adult   Alzheimer's disease   Bilateral pulmonary embolism   Essential hypertension   Status epilepticus   Protein-calorie malnutrition, severe   DNR (do not resuscitate) discussion   Palliative care encounter  Acute encephalopathy, Likely secondary to status epilepticus , seizure-free since admission - started Keppra twice a day - CT scan without evidence of Hemorrhage or stroke - MRI brain 4/26, no acute findings - Seizure precautions  Alzheimer's disease, advanced and pt with significant deconditioning - Currently bedbound with multiple chronic pressure ulcers - Nonverbal - Palliative care consulted, patient code status DNR, patient with good family support, has 24/7 care at home, will return home to family.  Possible dysphasia - Speech therapy consultation - diet modified  Severe protein calorie malnutrition - No diet restrictions, start supplements - obtained nutrition consult  Hyperlipidemia with target LDL less than 100 , resume statin  Hyperglycemia , hemoglobin A1c 5.5, discontinue sliding scale insulin  Bilateral  pulmonary  embolism, history of DVT - Resume Coumadin  - INR therapeutic   Essential hypertension with elevated blood pressures - Blood pressure stable on admission, 126/64 -Discontinue hydralazine secondary to sinus tachycardia Continue lopressor/losartan    Functional quadriplegia with multiple stage II pressure ulcers on right ankle, buttocks, stage III/4 on left hip , present at the time of admission - Patient bedbound at baseline due to severe advanced dementia - Wound care consultation, wound vac to left hip  Abdominal pain, may be due to urinary retention. Had several BMs overnight after enema - UA negative - LFTs wnl - Lipase wnl - Check PVR and place foley if > retained urine -denies pain on /27 -avoid constipation  Hypokalemia, due to poor oral intake - Start potassium chloride, k wnl at time of discharge  Normocytic anemia - h/h stable, no sign of bleed.              Code status: DNR  Procedures:  none Consultations:  Palliative care  Discharge Exam: BP 119/62 mmHg  Pulse 83  Temp(Src) 98.8 F (37.1 C) (Oral)  Resp 20  Ht 5\' 2"  (1.575 m)  Wt 50 kg (110 lb 3.7 oz)  BMI 20.16 kg/m2  SpO2 100%   General: Frail, cachectic female, lying on left side in bed , curled in fetal position with pillow between her legs  HEENT: NCAT, MMM  Cardiovascular: Tachycardic, regular rhythm, 2+ pulses, warm extremities  Respiratory: CTAB, no increased WOB  Abdomen: NABS, soft, Nondistended, patient trying to sit up during my exam and so abdominal muscles are tensed , however she does not appear to have any true guarding or tenderness to palpation. Unable to determine whether her bladder feels distended  MSK: Significantly decreased muscle tone and bulk, No lower extremity edema. Chronic contracture  Neuro: No facial asymmetry, strength 3 out of 5 upper extremities , she minimally moves her lower extremities during examination   Skin:  chronic decubitus ulcer, the one on the left hip has wound vac attached to.  Discharge Instructions You were cared for by a hospitalist during your hospital stay. If you have any questions about your discharge medications or the care you received while you were in the hospital after you are discharged, you can call the unit and asked to speak with the hospitalist on call if the hospitalist that took care of you is not available. Once you are discharged, your primary care physician will handle any further medical issues. Please note that NO REFILLS for any discharge medications will be authorized once you are discharged, as it is imperative that you return to your primary care physician (or establish a relationship with a primary care physician if you do not have one) for your aftercare needs so that they can reassess your need for medications and monitor your lab values.      Discharge Instructions    Diet - low sodium heart healthy    Complete by:  As directed      Face-to-face encounter (required for Medicare/Medicaid patients)    Complete by:  As directed   I Gene Colee certify that this patient is under my care and that I, or a nurse practitioner or physician's assistant working with me, had a face-to-face encounter that meets the physician face-to-face encounter requirements with this patient on 02/17/2015. The encounter with the patient was in whole, or in part for the following medical condition(s) which is the primary reason for home health care (List medical condition):  FTT/advcned dementia  The encounter with the patient was in whole, or in part, for the following medical condition, which is the primary reason for home health care:  FTT/ advanced dementia  I certify that, based on my findings, the following services are medically necessary home health services:   Nursing Physical therapy    Reason for Medically Necessary Home Health Services:  Skilled Nursing- Change/Decline in Patient Status    My clinical findings support the need for the above services:  Cognitive impairments, dementia, or mental confusion  that make it unsafe to leave home  Further, I certify that my clinical findings support that this patient is homebound due to:  Mental confusion     Home Health    Complete by:  As directed   To provide the following care/treatments:   OT RN    RN for wound care/wound vac management/INR monitoring in three days/report result to pmd     Increase activity slowly    Complete by:  As directed             Medication List    STOP taking these medications        doxycycline 100 MG tablet  Commonly known as:  VIBRA-TABS     hydrALAZINE 50 MG tablet  Commonly known as:  APRESOLINE      TAKE these medications        alendronate 70 MG tablet  Commonly known as:  FOSAMAX  TAKE 1 TABLET ONCE A WEEK ON TUESDAYS. TAKE WITH A FULL GLASS OF WATER ON AN EMPTY STOMACH.     ALPRAZolam 0.5 MG tablet  Commonly known as:  XANAX  TAKE 1 TAB BY MOUTH 3 TIMES A DAYS AND 1 TAB AT BEDTIME AS NEEDED FOR ANXIETY     AMBULATORY NON FORMULARY MEDICATION  - Air Mattress with Gel Overlay  - Dx: T07, L89.221, L89.150, L89.510     diclofenac sodium 1 % Gel  Commonly known as:  VOLTAREN  Apply 1 application topically 2 (two) times daily. Apply to knees and hips     feeding supplement (ENSURE ENLIVE) Liqd  Take 237 mLs by mouth 3 (three) times daily between meals.     levETIRAcetam 500 MG tablet  Commonly known as:  KEPPRA  Take 1 tablet (500 mg total) by mouth 2 (two) times daily.     losartan 25 MG tablet  Commonly known as:  COZAAR  Take 25 mg by mouth daily.     memantine 10 MG tablet  Commonly known as:  NAMENDA  Take 10 mg by mouth 2 (two) times daily.     metoprolol tartrate 25 MG tablet  Commonly known as:  LOPRESSOR  Take 12.5 mg by mouth 2 (two) times daily.     nitroGLYCERIN 0.4 MG SL tablet  Commonly known as:  NITROSTAT  Place 0.4 mg under the tongue every 5  (five) minutes as needed for chest pain. Take one tablet sublingual every 5 minutes not to exceed three tablets for chest pain     omeprazole 20 MG capsule  Commonly known as:  PRILOSEC  Take 20 mg by mouth daily.     rosuvastatin 10 MG tablet  Commonly known as:  CRESTOR  Take 10 mg by mouth daily.     warfarin 5 MG tablet  Commonly known as:  COUMADIN  TAKE 1 TABLET EVERY DAY AT 6PM       Allergies  Allergen Reactions  . Risperdal [Risperidone] Other (See  Comments)    sedation   Follow-up Information    Follow up with REED, [name], DO In 2 weeks.   Specialty:  Geriatric Medicine   Why:  hospital discharge follow up   Contact information:   [address] [place] [address] [phone]       Follow up with [facility]              On 02/25/2015.   Why:  Appointment made for 02/25/2015 at 1:00 pm for f/u wound vac   Contact information:   [address] [place] [address] [phone]       The results of significant diagnostics from this hospitalization (including imaging, microbiology, ancillary and laboratory) are listed below for reference.    Significant Diagnostic Studies: Ct Head Wo Contrast  02/15/2015   CLINICAL DATA:  Seizures.  Late stage Alzheimer's disease.  EXAM: CT HEAD WITHOUT CONTRAST  TECHNIQUE: Contiguous axial images were obtained from the base of the skull through the vertex without intravenous contrast.  COMPARISON:  07/31/2014.  FINDINGS: No mass lesion, mass effect, midline shift, hydrocephalus, hemorrhage. No acute territorial cortical ischemia/infarct. Atrophy and chronic ischemic white matter disease is present. Paranasal sinuses are within normal limits. Intracranial atherosclerosis.  IMPRESSION: Atrophy and chronic ischemic white matter disease without acute intracranial abnormality.   Electronically Signed   By: Andreas Newport M.D.   On: 02/15/2015 12:51   Mr Brain Wo  Contrast  02/16/2015   CLINICAL DATA:  Patient with end-stage dementia and new onset seizures.  EXAM: MRI HEAD WITHOUT CONTRAST  TECHNIQUE: Multiplanar, multiecho pulse sequences of the brain and surrounding structures were obtained without intravenous contrast.  COMPARISON:  02/15/2015.  FINDINGS: No evidence for acute infarction, hemorrhage, mass lesion, or extra-axial fluid. Severe atrophy. Hydrocephalus ex vacuo.  Extensive T2 and FLAIR hyperintensity throughout the periventricular and subcortical white matter representing advanced chronic microvascular ischemic change. Tiny foci of chronic hemorrhage scattered throughout the brain, nonspecific, but likely sequelae of longstanding hypertensive cerebrovascular disease, given the history of hypertension and renal artery stenosis.  Partial empty sella. No upper cervical lesions or tonsillar herniation. Flow voids are maintained in the carotid, basilar, and vertebral arteries. BILATERAL cataract extraction. No sinus or mastoid air fluid levels.  Thin-section imaging through the temporal lobes demonstrates extreme volume loss without focal mass or inflammatory process.  IMPRESSION: No seizure focus is evident. Severe atrophy with extensive small vessel disease. Good agreement with recent CT scan.   Electronically Signed   By: Davonna Belling M.D.   On: 02/16/2015 14:41   Dg Chest Portable 1 View  02/15/2015   CLINICAL DATA:  Seizures.  EXAM: PORTABLE CHEST - 1 VIEW  COMPARISON:  CT chest 07/27/2014.  PA and lateral chest 05/03/2013.  FINDINGS: The lungs are clear. Heart size is upper normal. No pneumothorax or pleural effusion.  IMPRESSION: No acute disease.   Electronically Signed   By: Drusilla Kanner M.D.   On: 02/15/2015 13:08    Microbiology: Recent Results (from the past 240 hour(s))  Urine culture     Status: None   Collection Time: 02/15/15  1:30 PM  Result Value Ref Range Status   Specimen Description URINE, RANDOM  Final   Special Requests NONE   Final   Colony Count NO GROWTH Performed at [facility]   Final   Culture NO GROWTH Performed at [facility]   Final   Report Status  02/16/2015 FINAL  Final  MRSA PCR Screening     Status: None   Collection Time: 02/15/15  7:14 PM  Result Value Ref Range Status   MRSA by PCR NEGATIVE NEGATIVE Final    Comment:        The GeneXpert MRSA Assay (FDA approved for NASAL specimens only), is one component of a comprehensive MRSA colonization surveillance program. It is not intended to diagnose MRSA infection nor to guide or monitor treatment for MRSA infections.   Clostridium Difficile by PCR     Status: None   Collection Time: 02/17/15  5:00 PM  Result Value Ref Range Status   C difficile by pcr NEGATIVE NEGATIVE Final     Labs: Basic Metabolic Panel:  Recent Labs Lab 02/15/15 1140 02/15/15 1158 02/16/15 0315 02/17/15 0225 02/18/15 0309  NA 140 142 140 139 141  K 3.6 3.6 3.3* 3.8 4.1  CL 106 104 106 108 107  CO2 21  --  GLUCOSE 182* 186* 100* 88 85  BUN <5* 4* <5* <5* 6  CREATININE 0.59 0.40* 0.40* 0.43* 0.41*  CALCIUM 9.3  --  9.7 9.3 9.2   Liver Function Tests:  Recent Labs Lab 02/15/15 1140  AST 32  ALT 12  ALKPHOS 64  BILITOT 0.3  PROT 7.0  ALBUMIN 3.0*    Recent Labs Lab 02/16/15 0315  LIPASE 22   No results for input(s): AMMONIA in the last 168 hours. CBC:  Recent Labs Lab 02/15/15 1140 02/15/15 1158 02/16/15 0315 02/17/15 0225 02/18/15 0309  WBC 7.1  --  10.1 6.6 6.7  NEUTROABS 5.1  --   --   --   --   HGB 10.9* 12.2 10.2* 9.3* 8.9*  HCT 34.1* 36.0 32.1* 29.3* 28.6*  MCV 94.2  --  92.2 92.1 93.2  PLT 326  --  376 303 307   Cardiac Enzymes:  Recent Labs Lab 02/15/15 1140  TROPONINI <0.03   BNP: BNP (last 3 results) No results for input(s): BNP in the last 8760 hours.  ProBNP (last 3 results) No results for input(s): PROBNP in the last 8760 hours.  CBG:  Recent Labs Lab 02/17/15 0913  02/17/15 1124 02/17/15 1632 02/17/15 2145 02/18/15 0816  GLUCAP 79 129* 114* 152* 85       Signed:  Shriyans Kuenzi MD, PhD  [facility] 02/18/2015, 12:51 PM

## 2015-02-19 DIAGNOSIS — L89153 Pressure ulcer of sacral region, stage 3: Secondary | ICD-10-CM

## 2015-02-19 DIAGNOSIS — L89223 Pressure ulcer of left hip, stage 3: Secondary | ICD-10-CM

## 2015-02-19 DIAGNOSIS — G309 Alzheimer's disease, unspecified: Secondary | ICD-10-CM

## 2015-02-19 DIAGNOSIS — L89513 Pressure ulcer of right ankle, stage 3: Secondary | ICD-10-CM

## 2015-02-22 ENCOUNTER — Encounter (HOSPITAL_COMMUNITY): Payer: Self-pay | Admitting: *Deleted

## 2015-02-22 ENCOUNTER — Ambulatory Visit: Payer: Medicare Other | Admitting: Internal Medicine

## 2015-02-22 ENCOUNTER — Emergency Department (HOSPITAL_COMMUNITY)
Admission: EM | Admit: 2015-02-22 | Discharge: 2015-02-22 | Disposition: A | Discharge status: Home / Self Care | Payer: Medicare Other | Attending: Emergency Medicine | Admitting: Emergency Medicine

## 2015-02-22 DIAGNOSIS — Z8619 Personal history of other infectious and parasitic diseases: Secondary | ICD-10-CM | POA: Diagnosis not present

## 2015-02-22 DIAGNOSIS — F329 Major depressive disorder, single episode, unspecified: Secondary | ICD-10-CM | POA: Diagnosis not present

## 2015-02-22 DIAGNOSIS — Z8701 Personal history of pneumonia (recurrent): Secondary | ICD-10-CM | POA: Diagnosis not present

## 2015-02-22 DIAGNOSIS — L89220 Pressure ulcer of left hip, unstageable: Secondary | ICD-10-CM | POA: Diagnosis not present

## 2015-02-22 DIAGNOSIS — F419 Anxiety disorder, unspecified: Secondary | ICD-10-CM | POA: Insufficient documentation

## 2015-02-22 DIAGNOSIS — Z87448 Personal history of other diseases of urinary system: Secondary | ICD-10-CM | POA: Diagnosis not present

## 2015-02-22 DIAGNOSIS — K219 Gastro-esophageal reflux disease without esophagitis: Secondary | ICD-10-CM | POA: Insufficient documentation

## 2015-02-22 DIAGNOSIS — Z7983 Long term (current) use of bisphosphonates: Secondary | ICD-10-CM | POA: Diagnosis not present

## 2015-02-22 DIAGNOSIS — I10 Essential (primary) hypertension: Secondary | ICD-10-CM | POA: Insufficient documentation

## 2015-02-22 DIAGNOSIS — L89229 Pressure ulcer of left hip, unspecified stage: Secondary | ICD-10-CM | POA: Diagnosis present

## 2015-02-22 DIAGNOSIS — Z872 Personal history of diseases of the skin and subcutaneous tissue: Secondary | ICD-10-CM | POA: Insufficient documentation

## 2015-02-22 DIAGNOSIS — G40909 Epilepsy, unspecified, not intractable, without status epilepticus: Secondary | ICD-10-CM | POA: Insufficient documentation

## 2015-02-22 DIAGNOSIS — G309 Alzheimer's disease, unspecified: Secondary | ICD-10-CM | POA: Insufficient documentation

## 2015-02-22 DIAGNOSIS — Z7901 Long term (current) use of anticoagulants: Secondary | ICD-10-CM | POA: Diagnosis not present

## 2015-02-22 DIAGNOSIS — Z79899 Other long term (current) drug therapy: Secondary | ICD-10-CM | POA: Insufficient documentation

## 2015-02-22 DIAGNOSIS — F0280 Dementia in other diseases classified elsewhere without behavioral disturbance: Secondary | ICD-10-CM | POA: Diagnosis not present

## 2015-02-22 DIAGNOSIS — Z791 Long term (current) use of non-steroidal anti-inflammatories (NSAID): Secondary | ICD-10-CM | POA: Insufficient documentation

## 2015-02-22 HISTORY — DX: Unspecified convulsions: R56.9

## 2015-02-22 NOTE — ED Provider Notes (Signed)
CSN: [id number]     Arrival date & time 02/22/15  1449 History   First MD Initiated Contact with Patient 02/22/15 1942     Chief Complaint  Patient presents with  . Pressure Ulcer     (Consider location/radiation/quality/duration/timing/severity/associated sxs/prior Treatment) HPI Sabrina Long is a 79 y.o. female  With hx of end stage dementia, non ambulatory, presents to ED with complaining of an ulcer to the left hip. Ulcer started approximately 4 months ago. Pt is followed by wound care center. Pt is currently being treated with wound vac. Pt was seen today by home health care nurse, who was concerned that there also may be getting infected. Patient was sent here for further evaluation. Family stated that the wound looks exactly the same like it has been. They have been taking care of her and have treated her prior ulcers and did not think that this wound looks infected. He denies any purulent drainage. No fever or chills at home. Family think that the nurse was just not able to put the wound VAC protocol. Patient has an appointment with wound care in 2 days.    Past Medical History  Diagnosis Date  . Unspecified constipation   . Anxiety state, unspecified   . Colitis, enteritis, and gastroenteritis of presumed infectious origin 11/02/2011  . Pneumonia, organism unspecified 11/02/2011  . Cystitis, unspecified   . Impetigo   . Renal artery stenosis   . Dementia in conditions classified elsewhere with behavioral disturbance   . Hypercalcemia   . Reflux esophagitis   . Chest pain, unspecified   . Abnormality of gait   . Hyperosmolality and/or hypernatremia   . Unspecified disorder of liver   . Candidiasis of vulva and vagina   . Alzheimer's disease   . Chest pain, unspecified 08/19/2008  . Dementia in conditions classified elsewhere without behavioral disturbance   . Depressive disorder, not elsewhere classified   . Esophageal reflux   . Other and unspecified hyperlipidemia    . Loss of weight   . Unspecified persistent mental disorders due to conditions classified elsewhere   . Reactive confusion   . Unspecified essential hypertension   . Other seborrheic keratosis   . Osteoarthrosis, unspecified whether generalized or localized, unspecified site   . Stiffness of joint, not elsewhere classified, unspecified site   . Memory loss 07/24/2007  . Headache(784.0)   . Undiagnosed cardiac murmurs   . Seizures    Past Surgical History  Procedure Laterality Date  . Cesarean section     No family history on file. History  Substance Use Topics  . Smoking status: Never Smoker   . Smokeless tobacco: Never Used  . Alcohol Use: No   OB History    No data available     Review of Systems  Unable to perform ROS: Dementia      Allergies  Risperdal  Home Medications   Prior to Admission medications   Medication Sig Start Date End Date Taking? Authorizing Provider  alendronate (FOSAMAX) 70 MG tablet TAKE 1 TABLET ONCE A WEEK ON TUESDAYS. TAKE WITH A FULL GLASS OF WATER ON AN EMPTY STOMACH. 11/09/14   [name] L Reed, DO  ALPRAZolam (XANAX) 0.5 MG tablet TAKE 1 TAB BY MOUTH 3 TIMES A DAYS AND 1 TAB AT BEDTIME AS NEEDED FOR ANXIETY Patient taking differently: Take 0.25-0.5 mg by mouth 2 (two) times daily as needed for anxiety. TAKE 1 TAB BY MOUTH 3 TIMES A DAYS AND 1 TAB AT BEDTIME  AS NEEDED FOR ANXIETY 01/13/15   Kirt Boys, DO  AMBULATORY NON [facility] MEDICATION Air Mattress with Gel Overlay Dx: T07, L89.221, L89.150, L89.510 12/25/14   Kirt Boys, DO  diclofenac sodium (VOLTAREN) 1 % GEL Apply 1 application topically 2 (two) times daily. Apply to knees and hips    Historical Provider, MD  feeding supplement, ENSURE ENLIVE, (ENSURE ENLIVE) LIQD Take 237 mLs by mouth 3 (three) times daily between meals. 02/17/15   Albertine Grates, MD  levETIRAcetam (KEPPRA) 500 MG tablet Take 1 tablet (500 mg total) by mouth 2 (two) times daily. 02/17/15   Albertine Grates, MD  losartan  (COZAAR) 25 MG tablet Take 25 mg by mouth daily.    Historical Provider, MD  memantine (NAMENDA) 10 MG tablet Take 10 mg by mouth 2 (two) times daily.    Historical Provider, MD  metoprolol tartrate (LOPRESSOR) 25 MG tablet Take 12.5 mg by mouth 2 (two) times daily.    Historical Provider, MD  nitroGLYCERIN (NITROSTAT) 0.4 MG SL tablet Place 0.4 mg under the tongue every 5 (five) minutes as needed for chest pain. Take one tablet sublingual every 5 minutes not to exceed three tablets for chest pain    Historical Provider, MD  omeprazole (PRILOSEC) 20 MG capsule Take 20 mg by mouth daily.    Historical Provider, MD  rosuvastatin (CRESTOR) 10 MG tablet Take 10 mg by mouth daily.    Historical Provider, MD  warfarin (COUMADIN) 5 MG tablet TAKE 1 TABLET EVERY DAY AT [facility] 02/18/15   [name] L Reed, DO   BP 108/56 mmHg  Pulse 111  Temp(Src) 99.1 F (37.3 C) (Oral)  Resp 16  Ht  (1.549 m)  Wt 110 lb (49.896 kg)  BMI 20.80 kg/m2  SpO2 99% Physical Exam  Constitutional: She is oriented to person, place, and time. She appears well-developed and well-nourished. No distress.  Eyes: Conjunctivae are normal.  Neck: Neck supple.  Cardiovascular: Normal rate, regular rhythm and normal heart sounds.   Pulmonary/Chest: Effort normal and breath sounds normal. No respiratory distress. She has no wheezes. She has no rales.  Neurological: She is alert and oriented to person, place, and time.  Skin: Skin is warm and dry.  Deep stage 3/4 ulcer to the left lateral hip. Wound appears with no signs of erythema, no purulent drainage. serosanguinous noted.     Nursing note and vitals reviewed.   ED Course  Procedures (including critical care time) Labs Review Labs Reviewed - No data to display  Imaging Review No results found.   EKG Interpretation None      MDM   Final diagnoses:  Pressure ulcer of left hip, unstageable    Patient is here with left hip ulcer. According to the family, ulcer  appears to be the same as usual. They deny any fever. Family will just wants the wound VAC placed back on and one to take her home. My exam there is no purulent drainage. There is no surrounding cellulitis or tenderness. Although the heart rate documented above 100, patient;s manual heart rate is in the 80s and 90s. Wound VAC was replaced by a nurse. Plan to discharge home. Patient has follow-up with wound care in 2 days.      Jaynie Crumble, PA-C 02/22/15 2344  Jerelyn Scott, MD 02/22/15 [phone]

## 2015-02-22 NOTE — ED Notes (Signed)
RR at bedside to replace wound vac.

## 2015-02-22 NOTE — ED Notes (Signed)
Pt sent by home healthcare RN for abnormal/copious drainage from wound vac on wound to L hip.  She stated it was pooling inside the wound with the vac on. RN removed wound vac and dressing.

## 2015-02-22 NOTE — ED Notes (Signed)
Contacted RR about changing wound vac

## 2015-02-22 NOTE — Discharge Instructions (Signed)
Please follow up with wound care for re evaluation.    Pressure Ulcer A pressure ulcer is a sore that has formed from the breakdown of skin and exposure of deeper layers of tissue. It develops in areas of the body where there is unrelieved pressure. Pressure ulcers are usually found over a bony area, such as the shoulder blades, spine, lower back, hips, knees, ankles, and heels. Pressure ulcers vary in severity. Your health care provider may determine the severity (stage) of your pressure ulcer. The stages include:  Stage I--The skin is red, and when the skin is pressed, it stays red.  Stage II--The top layer of skin is gone, and there is a shallow, pink ulcer.  Stage III--The ulcer becomes deeper, and it is more difficult to see the whole wound. Also, there may be yellow or brown parts, as well as pink and red parts.  Stage IV--The ulcer may be deep and red, pink, brown, white, or yellow. Bone or muscle may be seen.  Unstageable pressure ulcer--The ulcer is covered almost completely with black, brown, or yellow tissue. It is not known how deep the ulcer is or what stage it is until this covering comes off.  Suspected deep tissue injury--A person's skin can be injured from pressure or pulling on the skin when his or her position is changed. The skin appears purple or maroon. There may not be an opening in the skin, but there could be a blood-filled blister. This deep tissue injury is often difficult to see in people with darker skin tones. The site may open and become deeper in time. However, early interventions will help the area heal and may prevent the area from opening. CAUSES  Pressure ulcers are caused by pressure against the skin that limits the flow of blood to the skin and nearby tissues. There are many risk factors that can lead to pressure sores. RISK FACTORS  Decreased ability to move.  Decreased ability to feel pain or discomfort.  Excessive skin moisture from urine, stool,  sweat, or secretions.  Poor nutrition.  Dehydration.  Tobacco, drug, or alcohol abuse.  Having someone pull on bedsheets that are under you, such as when health care workers are changing your position in a hospital bed.  Obesity.  Increased adult age.  Hospitalization in a critical care unit for longer than 4 days with use of medical devices.  Prolonged use of medical devices.  Critical illness.  Anemia.  Traumatic brain injury.  Spinal cord injury.  Stroke.  Diabetes.  Poor blood glucose control.  Low blood pressure (hypotension).  Low oxygen levels.  Medicines that reduce blood flow.  Infection. DIAGNOSIS  Your health care provider will diagnose your pressure ulcer based on its appearance. The health care provider may determine the stage of your pressure ulcer as well. Tests may be done to check for infection, to assess your circulation, or to check for other diseases, such as diabetes. TREATMENT  Treatment of your pressure ulcer begins with determining what stage the ulcer is in. Your treatment team may include your health care provider, a wound care specialist, a nutritionist, a physical therapist, and a Careers adviser. Possible treatments may include:   Moving or repositioning every 1-2 hours.  Using beds or mattresses to shift your body weight and pressure points frequently.  Improving your diet.  Cleaning and bandaging (dressing) the open wound.  Giving antibiotic medicines.  Removing damaged tissue.  Surgery and sometimes skin grafts. HOME CARE INSTRUCTIONS  If you were  hospitalized, follow the care plan that was started in the hospital.  Avoid staying in the same position for more than 2 hours. Use padding, devices, or mattresses to cushion your pressure points as directed by your health care provider.  Eat a well-balanced diet. Take nutritional supplements and vitamins as directed by your health care provider.  Keep all follow-up  appointments.  Only take over-the-counter or prescription medicines for pain, fever, or discomfort as directed by your health care provider. SEEK MEDICAL CARE IF:   Your pressure ulcer is not improving.  You do not know how to care for your pressure ulcer.  You notice other areas of redness on your skin.  You have a fever. SEEK IMMEDIATE MEDICAL CARE IF:   You have increasing redness, swelling, or pain in your pressure ulcer.  You notice pus coming from your pressure ulcer.  You notice a bad smell coming from the wound or dressing.  Your pressure ulcer opens up again. Document Released: 10/09/2005 Document Revised: 10/14/2013 Document Reviewed: 06/16/2013 [facility] Patient Information 2015 [employer] [place]. This information is not intended to replace advice given to you by your health care provider. Make sure you discuss any questions you have with your health care provider.

## 2015-02-25 ENCOUNTER — Encounter (HOSPITAL_BASED_OUTPATIENT_CLINIC_OR_DEPARTMENT_OTHER): Payer: Medicare Other | Attending: Internal Medicine

## 2015-02-25 DIAGNOSIS — L89512 Pressure ulcer of right ankle, stage 2: Secondary | ICD-10-CM | POA: Insufficient documentation

## 2015-02-25 DIAGNOSIS — L89324 Pressure ulcer of left buttock, stage 4: Secondary | ICD-10-CM | POA: Diagnosis not present

## 2015-02-25 DIAGNOSIS — L89153 Pressure ulcer of sacral region, stage 3: Secondary | ICD-10-CM | POA: Diagnosis not present

## 2015-02-26 ENCOUNTER — Telehealth: Payer: Self-pay | Admitting: Internal Medicine

## 2015-02-26 NOTE — Telephone Encounter (Deleted)
Sabrina Long"s [place] Dr. Renato Gailseed

## 2015-02-26 NOTE — Telephone Encounter (Signed)
Shauntel Ney"s daughter [place] to be filled out by Dr. Renato Gailseed. The form was place in the rx tray in medical records.

## 2015-03-02 ENCOUNTER — Telehealth: Payer: Self-pay | Admitting: *Deleted

## 2015-03-02 LAB — POCT INR: INR: 1.8 — AB (ref 0.9–1.1)

## 2015-03-02 NOTE — Telephone Encounter (Signed)
Per Dr. Ardyth GalGreen---Be sure she has not missed doses. If not, increase to 6mg  daily and repeat INR in 1 week. Called and spoke with Herbert SetaHeather and she stated that to her knowledge patient has not missed any doses. She stated that she will talk with family and if so she will call back. She will redraw in 1 week. Agreed.

## 2015-03-02 NOTE — Telephone Encounter (Signed)
[name] called with Protime Results 03/02/2015   INR:  1.8  Current Dose of Coumadin: 5mg  daily Printed and given to Shanda BumpsJessica to review.

## 2015-03-02 NOTE — Telephone Encounter (Signed)
[place] form filled [place] and given to Dr. Renato Gailseed to review and sign. Placed in Dr. Sherolyn Bubaeeds Folder.

## 2015-03-04 DIAGNOSIS — Z029 Encounter for administrative examinations, unspecified: Secondary | ICD-10-CM

## 2015-03-04 NOTE — Telephone Encounter (Signed)
[place] Forms filled out and completed. Copied and left up front for [facility], Daughter to pick up. She was notified. Sent forms for scanning

## 2015-03-05 ENCOUNTER — Ambulatory Visit (INDEPENDENT_AMBULATORY_CARE_PROVIDER_SITE_OTHER): Payer: Medicare Other | Admitting: Internal Medicine

## 2015-03-05 ENCOUNTER — Encounter: Payer: Self-pay | Admitting: Internal Medicine

## 2015-03-05 VITALS — BP 118/60 | HR 63 | Temp 97.7°F | Resp 16

## 2015-03-05 DIAGNOSIS — R569 Unspecified convulsions: Secondary | ICD-10-CM | POA: Diagnosis not present

## 2015-03-05 DIAGNOSIS — E43 Unspecified severe protein-calorie malnutrition: Secondary | ICD-10-CM | POA: Diagnosis not present

## 2015-03-05 DIAGNOSIS — I2699 Other pulmonary embolism without acute cor pulmonale: Secondary | ICD-10-CM | POA: Diagnosis not present

## 2015-03-05 DIAGNOSIS — L89511 Pressure ulcer of right ankle, stage 1: Secondary | ICD-10-CM | POA: Diagnosis not present

## 2015-03-05 DIAGNOSIS — L89302 Pressure ulcer of unspecified buttock, stage 2: Secondary | ICD-10-CM | POA: Diagnosis not present

## 2015-03-05 DIAGNOSIS — F0280 Dementia in other diseases classified elsewhere without behavioral disturbance: Secondary | ICD-10-CM

## 2015-03-05 DIAGNOSIS — L89223 Pressure ulcer of left hip, stage 3: Secondary | ICD-10-CM

## 2015-03-05 DIAGNOSIS — G309 Alzheimer's disease, unspecified: Secondary | ICD-10-CM

## 2015-03-05 NOTE — Progress Notes (Signed)
Patient ID: Sabrina Long, female   DOB: [birth year], 79 y.o.   MRN: [id number]   Location:  [facility]  Code Status: DNR   Allergies  Allergen Reactions  . Risperdal [Risperidone] Other (See Comments)    sedation    Chief Complaint  Patient presents with  . Hospitalization Follow-up    Patient here today to follow-up from visit 02/15/15 and ED visit on 02/22/15    HPI: Patient is a 79 y.o. black female seen in the office today for hospital f/u for 5-6 seizures on 02/15/15.  Was hospitalized for 4 days.  Then went to ED when wound vac could not be replaced properly by home health nursing.  Stage 2 buttock, stage 3 left hip, stage 1 right ankle per son.   Has three pressure ulcers at present:  Collagenase on two wounds right now and vac on the third wound.  They are looking good per her son.  [facility] nurse still coming out.  Have 24 hour coverage at home for her.    They are still not ready for hospice care.    Last INR low--recheck is Tuesday after taking  until then. Was changed 5/10.    Review of Systems: she cannot contribute to ros, obtained from son Review of Systems  Constitutional: Positive for weight loss and malaise/fatigue.  Musculoskeletal: Negative for falls.  Skin:       Three current pressure ulcers  Neurological: Positive for seizures and weakness.  Psychiatric/Behavioral: Positive for memory loss.     Past Medical History  Diagnosis Date  . Unspecified constipation   . Anxiety state, unspecified   . Colitis, enteritis, and gastroenteritis of presumed infectious origin 11/02/2011  . Pneumonia, organism unspecified 11/02/2011  . Cystitis, unspecified   . Impetigo   . Renal artery stenosis   . Dementia in conditions classified elsewhere with behavioral disturbance   . Hypercalcemia   . Reflux esophagitis   . Chest pain, unspecified   . Abnormality of gait   . Hyperosmolality and/or hypernatremia   .  Unspecified disorder of liver   . Candidiasis of vulva and vagina   . Alzheimer's disease   . Chest pain, unspecified 08/19/2008  . Dementia in conditions classified elsewhere without behavioral disturbance   . Depressive disorder, not elsewhere classified   . Esophageal reflux   . Other and unspecified hyperlipidemia   . Loss of weight   . Unspecified persistent mental disorders due to conditions classified elsewhere   . Reactive confusion   . Unspecified essential hypertension   . Other seborrheic keratosis   . Osteoarthrosis, unspecified whether generalized or localized, unspecified site   . Stiffness of joint, not elsewhere classified, unspecified site   . Memory loss 07/24/2007  . Headache(784.0)   . Undiagnosed cardiac murmurs   . Seizures     Past Surgical History  Procedure Laterality Date  . Cesarean section      Social History:   reports that she has never smoked. She has never used smokeless tobacco. She reports that she does not drink alcohol or use illicit drugs.  History reviewed. No pertinent family history.  Medications: Patient's Medications  New Prescriptions   No medications on file  Previous Medications   ALENDRONATE (FOSAMAX) 70 MG TABLET    TAKE 1 TABLET ONCE A WEEK ON TUESDAYS. TAKE WITH A FULL GLASS OF WATER ON AN EMPTY STOMACH.   ALPRAZOLAM (XANAX) 0.5 MG TABLET    TAKE  1 TAB BY MOUTH 3 TIMES A DAYS AND 1 TAB AT BEDTIME AS NEEDED FOR ANXIETY   AMBULATORY NON FORMULARY MEDICATION    Air Mattress with Gel Overlay Dx: T07, L89.221, L89.150, L89.510   DICLOFENAC SODIUM (VOLTAREN) 1 % GEL    Apply 1 application topically 2 (two) times daily. Apply to knees and hips   FEEDING SUPPLEMENT, ENSURE ENLIVE, (ENSURE ENLIVE) LIQD    Take 237 mLs by mouth 3 (three) times daily between meals.   LEVETIRACETAM (KEPPRA) 500 MG TABLET    Take 1 tablet (500 mg total) by mouth 2 (two) times daily.   LOSARTAN (COZAAR) 25 MG TABLET    Take 25 mg by mouth daily.    MEMANTINE (NAMENDA) 10 MG TABLET    Take 10 mg by mouth 2 (two) times daily.   METOPROLOL TARTRATE (LOPRESSOR) 25 MG TABLET    Take 12.5 mg by mouth 2 (two) times daily.   NITROGLYCERIN (NITROSTAT) 0.4 MG SL TABLET    Place 0.4 mg under the tongue every 5 (five) minutes as needed for chest pain. Take one tablet sublingual every 5 minutes not to exceed three tablets for chest pain   OMEPRAZOLE (PRILOSEC) 20 MG CAPSULE    Take 20 mg by mouth daily.   ROSUVASTATIN (CRESTOR) 10 MG TABLET    Take 10 mg by mouth daily.   SERTRALINE (ZOLOFT) 100 MG TABLET    Take 100 mg by mouth at bedtime.   WARFARIN (COUMADIN) 6 MG TABLET    Take 6 mg by mouth daily.  Modified Medications   No medications on file  Discontinued Medications   WARFARIN (COUMADIN) 5 MG TABLET    TAKE 1 TABLET EVERY DAY AT 6PM     Physical Exam: Filed Vitals:   03/05/15 1121  BP: 118/60  Pulse: 63  Temp: 97.7 F (36.5 C)  TempSrc: Oral  Resp: 16  SpO2: 91%   Physical Exam  Constitutional: No distress.  Is alert today, sitting up in wheelchair looking around, says a few words; cachectic black female  Cardiovascular: Normal rate, regular rhythm and normal heart sounds.   Pulmonary/Chest: Effort normal and breath sounds normal.  Abdominal: Soft. Bowel sounds are normal.  Neurological: She is alert.  Mostly nonverbal  Skin:  Wound vac on left hip wound (stage 3), right lateral malleolus stage 1 with duoderm on it; buttock wound stage 2   Labs reviewed: Basic Metabolic Panel:  Recent Labs  16/07/9603/26/16 0315 02/17/15 0225 02/18/15 0309  NA 140 139 141  K 3.3* 3.8 4.1  CL 106 108 107  CO2 25 24 27   GLUCOSE 100* 88 85  BUN <5* <5* 6  CREATININE 0.40* 0.43* 0.41*  CALCIUM 9.7 9.3 9.2   Liver Function Tests:  Recent Labs  12/18/14 1057 02/15/15 1140  AST 21 32  ALT 9 12  ALKPHOS 84 64  BILITOT <0.2 0.3  PROT 6.2 7.0  ALBUMIN  --  3.0*    Recent Labs  02/16/15 0315  LIPASE 22   No results for input(s):  AMMONIA in the last 8760 hours. CBC:  Recent Labs  07/27/14 1337  12/18/14 1057 02/15/15 1140  02/16/15 0315 02/17/15 0225 02/18/15 0309  WBC 8.7  < > 6.1 7.1  --  10.1 6.6 6.7  NEUTROABS 5.6  --  3.5 5.1  --   --   --   --   HGB 12.2  < > 11.5 10.9*  < > 10.2* 9.3* 8.9*  HCT 37.7  < > 36.0 34.1*  < > 32.1* 29.3* 28.6*  MCV 92.9  < > 94 94.2  --  92.2 92.1 93.2  PLT 285  < > 415* 326  --  376 303 307  < > = values in this interval not displayed. Lipid Panel: No results for input(s): CHOL, HDL, LDLCALC, TRIG, CHOLHDL, LDLDIRECT in the last 8760 hours. Lab Results  Component Value Date   HGBA1C 5.5 02/15/2015    Assessment/Plan 1. Pressure ulcer of left hip, stage III -[facility] nursing changing wound vac, seeing [facility] -needs to keep air mattress with gel overlay permanently due to wounds recurring each time this is discontinued -has 24 hr care by alternating family  2. Pressure ulcer of right ankle, stage I -covered with dressing  3. Stage II pressure ulcer of buttock, unspecified laterality -getting santyl to this wound  4. Seizures -cont keppra therapy which has been effective w/o breakthrough since hospitalization  5. Alzheimer's disease -end stage--mostly nonverbal, not ambulatory, has dysphagia (on thin liquids, fine chopped food, meds crushed with puree), needs full ADL assist; VA form was completed and updated for declining status -hospice care at home has been recommended, but family is not yet ready for this  -prognosis is poor -I have also recommended changing to a novel anticoagulant to prevent her from needing to be stuck, but they have refused this and want to continue coumadin therapy  6. Protein-calorie malnutrition, severe -due to end stage dementia -is eating well (fed), but disease is late stage and weight loss is typical at this point  7. Pulmonary embolism, bilateral -continues on coumadin 6mg  at present -has next check on Tuesday  Labs/tests  ordered:  No new Next appt:  3 mos and prn  Zuleyma Scharf L. Kari Montero, D.O. Geriatrics [facility] [address] [place], [place] [address] Cell Phone (Mon-Fri 8am-5pm):  [phone] On Call:  [phone] & follow prompts after 5pm & weekends Office Phone:  [phone] Office Fax:  [phone]

## 2015-03-08 ENCOUNTER — Telehealth: Payer: Self-pay | Admitting: *Deleted

## 2015-03-08 LAB — POCT INR: INR: 1.9 — AB (ref 0.9–1.1)

## 2015-03-08 NOTE — Telephone Encounter (Signed)
Per Abbey ChattersJessica Eubanks, NP---Continue 6mg  and recheck in 1 week.

## 2015-03-08 NOTE — Telephone Encounter (Signed)
[name] with [name] called with Protime Results 03/08/2015  INR:  1.9  Previous INR on 03/02/15:  1.8  Current dose of Coumadin is 6 mg daily. Was previously taking 5mg  daily. Prescribed Coumadin due to DVT. Please Advise. Given to ThayerJessica to review.

## 2015-03-08 NOTE — Telephone Encounter (Signed)
[name] Notified and agreed.  °

## 2015-03-12 DIAGNOSIS — L89512 Pressure ulcer of right ankle, stage 2: Secondary | ICD-10-CM | POA: Diagnosis not present

## 2015-03-12 DIAGNOSIS — L89324 Pressure ulcer of left buttock, stage 4: Secondary | ICD-10-CM | POA: Diagnosis not present

## 2015-03-12 DIAGNOSIS — L89153 Pressure ulcer of sacral region, stage 3: Secondary | ICD-10-CM | POA: Diagnosis not present

## 2015-03-16 ENCOUNTER — Telehealth: Payer: Self-pay | Admitting: *Deleted

## 2015-03-16 ENCOUNTER — Telehealth: Payer: Self-pay | Admitting: Internal Medicine

## 2015-03-16 NOTE — Telephone Encounter (Signed)
[facility] called to report that the patient has 6 mg coumadin tabs. (no way to make 7 mg which was the does called in today.  Called 1 mg [facility] to [facility] to [facility] with 6mg  abs to make 7 mg.

## 2015-03-16 NOTE — Telephone Encounter (Signed)
Per Jessica--Increase Coumadin to 7mg  daily and recheck in 1 week. [name] with [name] Notified and will notify family and agreed.

## 2015-03-16 NOTE — Telephone Encounter (Signed)
[facility]  INR:  1.5  Previous INR on 03/08/2015: 1.9  Current dose of Coumadin is 6mg  daily. Prescribed Coumadin due to DVT. Please Advise. Given to Abbey ChattersJessica Eubanks to review

## 2015-03-17 ENCOUNTER — Other Ambulatory Visit: Payer: Self-pay | Admitting: *Deleted

## 2015-03-17 NOTE — Telephone Encounter (Signed)
[facility]  patient out of medication since Tuesday.

## 2015-03-23 ENCOUNTER — Telehealth: Payer: Self-pay | Admitting: *Deleted

## 2015-03-23 NOTE — Telephone Encounter (Signed)
Per Oretha EllisJessica Eubanks--Continue same dose of Coumadin and recheck in 1 week [name] Notified.

## 2015-03-23 NOTE — Telephone Encounter (Signed)
[name] with [name] called with Protime Results 03/23/2015  INR:  1.9  Current Dose of Coumadin is 7mg  daily. No changes in medications or diet. Prescribed Coumadin due to DVT. Previous INR 03/16/2015 was 1.5 Given to Abbey ChattersJessica Eubanks to review

## 2015-03-25 ENCOUNTER — Encounter: Payer: Self-pay | Admitting: Podiatry

## 2015-03-25 ENCOUNTER — Ambulatory Visit (INDEPENDENT_AMBULATORY_CARE_PROVIDER_SITE_OTHER): Payer: Medicare Other | Admitting: Podiatry

## 2015-03-25 VITALS — BP 110/65 | HR 88 | Resp 18

## 2015-03-25 DIAGNOSIS — M79676 Pain in unspecified toe(s): Secondary | ICD-10-CM | POA: Diagnosis not present

## 2015-03-25 DIAGNOSIS — B351 Tinea unguium: Secondary | ICD-10-CM

## 2015-03-25 DIAGNOSIS — I739 Peripheral vascular disease, unspecified: Secondary | ICD-10-CM

## 2015-03-25 NOTE — Progress Notes (Signed)
Patient ID: Sabrina Long, female   DOB: Nov 15, 1935, 79 y.o.   MRN: [id number] HPI  Complaint:  Visit Type: Patient returns to my office for continued preventative foot care services. Complaint: Patient states" my nails have grown long and thick and become painful to walk and wear shoes" . She presents for preventative foot care services. No changes to ROS  Podiatric Exam: Vascular: dorsalis pedis and posterior tibial pulses are negative. Capillary return is immediate. Temperature gradient is negative. Skin turgor WNL,   Sensorium: Normal Semmes Weinstein monofilament test. Normal tactile sensation bilaterally.  Nail Exam: Pt has thick disfigured discolored nails with subungual debris noted bilateral entire nail hallux through fifth toenails Ulcer Exam: There is no evidence of ulcer or pre-ulcerative changes or infection. Orthopedic Exam: Muscle tone and strength are WNL. No limitations in general ROM. No crepitus or effusions noted. Foot type and digits show no abnormalities. Bony prominences are unremarkable. Skin: No Porokeratosis. No infection or ulcers  Diagnosis:  Tinea unguium, Pain in right toe, pain in left toes  Treatment & Plan Procedures and Treatment: Consent by patient was obtained for treatment procedures. The patient understood the discussion of treatment and procedures well. All questions were answered thoroughly reviewed. Debridement of mycotic and hypertrophic toenails, 1 through 5 bilateral and clearing of subungual debris. No ulceration, no infection noted.  Return Visit-Office Procedure: Patient instructed to return to the office for a follow up visit 3 months for continued evaluation and treatment.

## 2015-03-26 ENCOUNTER — Encounter (HOSPITAL_BASED_OUTPATIENT_CLINIC_OR_DEPARTMENT_OTHER): Payer: Medicare Other | Attending: Internal Medicine

## 2015-03-26 DIAGNOSIS — G309 Alzheimer's disease, unspecified: Secondary | ICD-10-CM | POA: Diagnosis not present

## 2015-03-26 DIAGNOSIS — L89153 Pressure ulcer of sacral region, stage 3: Secondary | ICD-10-CM | POA: Diagnosis not present

## 2015-03-26 DIAGNOSIS — L89323 Pressure ulcer of left buttock, stage 3: Secondary | ICD-10-CM | POA: Diagnosis not present

## 2015-03-29 ENCOUNTER — Telehealth: Payer: Self-pay

## 2015-03-29 NOTE — Telephone Encounter (Signed)
[name] [phone]) called with home INR 2.6, patient current dose Coumadin 7mg  daily. Routed to Dr. Renato Gailseed

## 2015-03-29 NOTE — Telephone Encounter (Signed)
Continue coumadin 7mg  po q evening.  Recheck INR in 2 wks.

## 2015-03-31 ENCOUNTER — Telehealth: Payer: Self-pay | Admitting: *Deleted

## 2015-03-31 NOTE — Telephone Encounter (Signed)
[facility] a [facility], stated that she did'nt received the medication changes for her coumadin (Continue coumadin 7mg  po q evening. Recheck INR in 2 wks per Dr. Renato Gailseed. She stated that she did have some reservations regarding waiting 2 weeks to recheck due to the jump that it takes often.

## 2015-04-02 ENCOUNTER — Telehealth: Payer: Self-pay

## 2015-04-02 NOTE — Telephone Encounter (Signed)
Left message for Herbert Seta at [facility]

## 2015-04-02 NOTE — Telephone Encounter (Signed)
[name] from [facility] called to get verbal ok to do Dr. Jannetta Quint orders from [facility], Dr. Renato Gails is PCP. Gave verbal ok to use Dr. Jannetta Quint orders

## 2015-04-05 ENCOUNTER — Other Ambulatory Visit: Payer: Self-pay | Admitting: *Deleted

## 2015-04-05 MED ORDER — ALPRAZOLAM 0.5 MG PO TABS: ORAL_TABLET | ORAL | Status: DC

## 2015-04-05 NOTE — Telephone Encounter (Signed)
Patient son called and stated patient needed a refill on medication. Faxed to pharmacy.

## 2015-04-06 ENCOUNTER — Other Ambulatory Visit: Payer: Self-pay | Admitting: *Deleted

## 2015-04-06 NOTE — Telephone Encounter (Signed)
Patient son, Sabrina Long called and stated that the pharmacy told him Alprazolam wasn't filled yesterday. I called pharmacy and they told me it was filled yesterday. Called son back and informed him.

## 2015-04-09 DIAGNOSIS — L89153 Pressure ulcer of sacral region, stage 3: Secondary | ICD-10-CM | POA: Diagnosis not present

## 2015-04-09 DIAGNOSIS — L89323 Pressure ulcer of left buttock, stage 3: Secondary | ICD-10-CM | POA: Diagnosis not present

## 2015-04-09 DIAGNOSIS — G309 Alzheimer's disease, unspecified: Secondary | ICD-10-CM | POA: Diagnosis not present

## 2015-04-12 ENCOUNTER — Other Ambulatory Visit: Payer: Self-pay | Admitting: Internal Medicine

## 2015-04-12 ENCOUNTER — Telehealth: Payer: Self-pay | Admitting: *Deleted

## 2015-04-12 NOTE — Telephone Encounter (Signed)
Per Dr. Reed---Hold Coumadin 6/20, 6/21, 6/22 and recheck 6/23  [name] with [name] Notified and agreed and will explain instructions to patient family.

## 2015-04-12 NOTE — Telephone Encounter (Signed)
Sabrina Long with [name] called with Protime Results 04/12/2015  INR:  4.7  Current Dose of Coumadin: 7mg  daily daily. Haven't missed a dose per son, no diet changes, no signs or symptoms of bleeding. Previous INR on 03/29/15 was 2.6. Printed and given to Dr. Renato Gails to review and sign.

## 2015-04-14 ENCOUNTER — Encounter: Payer: Self-pay | Admitting: *Deleted

## 2015-04-14 ENCOUNTER — Telehealth: Payer: Self-pay | Admitting: *Deleted

## 2015-04-14 MED ORDER — METOPROLOL TARTRATE 25 MG PO TABS: ORAL_TABLET | ORAL | Status: DC

## 2015-04-14 NOTE — Telephone Encounter (Signed)
Increase metoprolol (lopressor) from 12.5mg  po bid to 25mg  po bid instead and continue to monitor pulse.

## 2015-04-14 NOTE — Telephone Encounter (Signed)
[name] with [name] called with concerns. Stated that patient has had tachycardic episodes the last couple visits. Pulse 110 consistent. No symptoms. Patient sleeping. BP 129/75, SPO2 98%, T 98.5, R 18. No pain. Patient is non verbal. Please Advise.

## 2015-04-14 NOTE — Telephone Encounter (Signed)
Patient son and Herbert Seta with [name] Notified and agreed. and faxed Rx to pharmacy with changes.

## 2015-04-15 ENCOUNTER — Telehealth: Payer: Self-pay | Admitting: *Deleted

## 2015-04-15 NOTE — Telephone Encounter (Signed)
Pt actually should have been on hold since 6/20 Please have pt start coumadin 5 mg daily and recheck INR on Monday 2/27

## 2015-04-15 NOTE — Telephone Encounter (Signed)
[name], from [facility] called with Protime Results 04/15/2015  INR 2.8 and PT 33.5  Current dose of Coumadin:7 mg daily previous INR 4.7 on 04/12/2015  Printed and given to Abbey Chatters, NP

## 2015-04-16 ENCOUNTER — Telehealth: Payer: Self-pay | Admitting: Internal Medicine

## 2015-04-16 DIAGNOSIS — G309 Alzheimer's disease, unspecified: Secondary | ICD-10-CM | POA: Diagnosis not present

## 2015-04-16 DIAGNOSIS — L89153 Pressure ulcer of sacral region, stage 3: Secondary | ICD-10-CM | POA: Diagnosis not present

## 2015-04-16 DIAGNOSIS — L89323 Pressure ulcer of left buttock, stage 3: Secondary | ICD-10-CM | POA: Diagnosis not present

## 2015-04-16 NOTE — Telephone Encounter (Signed)
Coumadin 5mg  qd, inr 5/27

## 2015-04-19 ENCOUNTER — Other Ambulatory Visit: Payer: Self-pay | Admitting: Internal Medicine

## 2015-04-19 ENCOUNTER — Telehealth: Payer: Self-pay | Admitting: *Deleted

## 2015-04-19 NOTE — Telephone Encounter (Signed)
Per Dr. Reed---Continue 5mg  daily and Recheck INR 04/22/2015  [name] Notified.

## 2015-04-19 NOTE — Telephone Encounter (Signed)
[name] with [name] called with Protime Results 04/19/2015  INR:  1.9  Current Dose of Coumadin:  5mg  once daily. Missed Thursday's dose.  Previous INR was 2.8 on 6/23 Printed and given to Dr. Renato Gailseed to review and sign.

## 2015-04-20 ENCOUNTER — Other Ambulatory Visit: Payer: Self-pay | Admitting: Internal Medicine

## 2015-04-20 DIAGNOSIS — L89510 Pressure ulcer of right ankle, unstageable: Secondary | ICD-10-CM

## 2015-04-20 DIAGNOSIS — L89223 Pressure ulcer of left hip, stage 3: Secondary | ICD-10-CM

## 2015-04-20 DIAGNOSIS — G309 Alzheimer's disease, unspecified: Secondary | ICD-10-CM

## 2015-04-20 DIAGNOSIS — L89153 Pressure ulcer of sacral region, stage 3: Secondary | ICD-10-CM

## 2015-04-22 ENCOUNTER — Telehealth: Payer: Self-pay | Admitting: *Deleted

## 2015-04-22 LAB — POCT INR: INR: 1.7 — AB (ref 0.9–1.1)

## 2015-04-22 NOTE — Telephone Encounter (Signed)
[name] with [name] called with Protime Results 04/22/2015  INR:  1.7  Current Dose of Coumadin: 5mg  once daily Previous INR 04/19/15 was 1.9 Printed and given to Dr. Renato Gailseed to review and sign

## 2015-04-22 NOTE — Telephone Encounter (Signed)
Per Dr. Val Rileseed--Increase Coumadin to 6mg  daily and recheck INR in 1 week. [name] and agreed. She will contact patient with instructions.

## 2015-04-23 ENCOUNTER — Encounter (HOSPITAL_BASED_OUTPATIENT_CLINIC_OR_DEPARTMENT_OTHER): Payer: Medicare Other | Attending: Internal Medicine

## 2015-04-23 DIAGNOSIS — L89223 Pressure ulcer of left hip, stage 3: Secondary | ICD-10-CM | POA: Diagnosis not present

## 2015-04-23 DIAGNOSIS — L89153 Pressure ulcer of sacral region, stage 3: Secondary | ICD-10-CM | POA: Insufficient documentation

## 2015-04-23 DIAGNOSIS — F0390 Unspecified dementia without behavioral disturbance: Secondary | ICD-10-CM | POA: Insufficient documentation

## 2015-04-23 DIAGNOSIS — L89510 Pressure ulcer of right ankle, unstageable: Secondary | ICD-10-CM | POA: Diagnosis not present

## 2015-04-23 DIAGNOSIS — I10 Essential (primary) hypertension: Secondary | ICD-10-CM | POA: Insufficient documentation

## 2015-04-29 ENCOUNTER — Other Ambulatory Visit: Payer: Self-pay | Admitting: *Deleted

## 2015-04-29 ENCOUNTER — Telehealth: Payer: Self-pay | Admitting: *Deleted

## 2015-04-29 MED ORDER — WARFARIN SODIUM 6 MG PO TABS: 6.0000 mg | ORAL_TABLET | Freq: Every day | ORAL | Status: DC

## 2015-04-29 NOTE — Telephone Encounter (Signed)
PT/INR ---  1.8    Patient taking 5 mg of Coumadin daily    Results given to Dr. Renato Gailseed

## 2015-04-29 NOTE — Telephone Encounter (Signed)
Dr.Reed increase her Coumadin to 6 mg and to recheck INR in 1 week.

## 2015-04-30 DIAGNOSIS — L89223 Pressure ulcer of left hip, stage 3: Secondary | ICD-10-CM | POA: Diagnosis not present

## 2015-04-30 DIAGNOSIS — I10 Essential (primary) hypertension: Secondary | ICD-10-CM | POA: Diagnosis not present

## 2015-04-30 DIAGNOSIS — L89153 Pressure ulcer of sacral region, stage 3: Secondary | ICD-10-CM | POA: Diagnosis not present

## 2015-04-30 DIAGNOSIS — L89510 Pressure ulcer of right ankle, unstageable: Secondary | ICD-10-CM | POA: Diagnosis not present

## 2015-05-02 ENCOUNTER — Other Ambulatory Visit: Payer: Self-pay | Admitting: Internal Medicine

## 2015-05-06 ENCOUNTER — Other Ambulatory Visit: Payer: Self-pay | Admitting: Internal Medicine

## 2015-05-06 NOTE — Telephone Encounter (Signed)
Spoke with patients son, patient is taking medication

## 2015-05-27 ENCOUNTER — Ambulatory Visit: Payer: Medicare Other | Admitting: Podiatry

## 2015-06-08 ENCOUNTER — Telehealth: Payer: Self-pay | Admitting: *Deleted

## 2015-06-08 NOTE — Telephone Encounter (Signed)
Herbert Seta also wanted to know when to draw the next INR. Stated that patient was just released from [facility]. Informed nurse to draw on Thursday and call with the INR results and current dose of Coumadin.

## 2015-06-08 NOTE — Telephone Encounter (Signed)
[name] with [name] called and wanted verbal orders for a Medical Child psychotherapist for transportation.  Verbal order given.

## 2015-06-10 ENCOUNTER — Ambulatory Visit: Payer: Medicare Other | Admitting: Internal Medicine

## 2015-06-10 ENCOUNTER — Encounter: Payer: Self-pay | Admitting: Internal Medicine

## 2015-06-10 ENCOUNTER — Other Ambulatory Visit: Payer: Self-pay | Admitting: Internal Medicine

## 2015-06-11 ENCOUNTER — Telehealth: Payer: Self-pay | Admitting: *Deleted

## 2015-06-11 NOTE — Telephone Encounter (Signed)
[name] with [name] called and left message to return her call. I called her back and she stated that she has already gotten verbal orders for a U/A and Culture from [facility] because patient was sent home with a Cath from [facility].

## 2015-06-14 ENCOUNTER — Other Ambulatory Visit: Payer: Self-pay | Admitting: Internal Medicine

## 2015-06-14 ENCOUNTER — Other Ambulatory Visit: Payer: Self-pay

## 2015-06-14 ENCOUNTER — Telehealth: Payer: Self-pay

## 2015-06-14 NOTE — Telephone Encounter (Signed)
Her INR is very low at 1.32.  I recommend we increase her coumadin to 7.5mg  on Mon and Fri and  all other days.  Recheck in one week.

## 2015-06-14 NOTE — Telephone Encounter (Signed)
Received phone call from [facility] with [name] [phone]. INR 1.32 today takes Coumadin 5 mg. Call son Judie Grieve with medication order [phone]. Rout to Dr. Renato Gails.

## 2015-06-16 MED ORDER — SACCHAROMYCES BOULARDII 250 MG PO CAPS: 250.0000 mg | ORAL_CAPSULE | Freq: Two times a day (BID) | ORAL | Status: DC

## 2015-06-16 MED ORDER — CIPROFLOXACIN HCL 250 MG PO TABS
250.0000 mg | ORAL_TABLET | Freq: Two times a day (BID) | ORAL | Status: DC
Start: 2015-06-16 — End: 2015-06-25

## 2015-06-16 NOTE — Telephone Encounter (Signed)
1.) Herbert Seta called back because she had not heard anything. The computer system was doe she she called. I called Dr.Reed on her cell to see if she recalled seeing and addressing PT/INR. Dr.Reed recalled instructions below. Instructions were given and Herbert Seta verbalized understanding of results.   I called Arlys John and gave results as well.    2.) Urine culture was received today and it indicated that patient has a UTI. Dr.Carter reviewed and advised Cipro 250 mg bid x 3 days and Floraster 250 mg bid x 7 days. Both RX's sent to pharmacy, patients son aware.

## 2015-06-18 ENCOUNTER — Other Ambulatory Visit: Payer: Self-pay | Admitting: Nurse Practitioner

## 2015-06-18 ENCOUNTER — Encounter (HOSPITAL_BASED_OUTPATIENT_CLINIC_OR_DEPARTMENT_OTHER): Payer: Medicare Other | Attending: Internal Medicine

## 2015-06-18 DIAGNOSIS — Z86718 Personal history of other venous thrombosis and embolism: Secondary | ICD-10-CM | POA: Diagnosis not present

## 2015-06-18 DIAGNOSIS — L89154 Pressure ulcer of sacral region, stage 4: Secondary | ICD-10-CM | POA: Insufficient documentation

## 2015-06-18 DIAGNOSIS — G309 Alzheimer's disease, unspecified: Secondary | ICD-10-CM | POA: Insufficient documentation

## 2015-06-18 DIAGNOSIS — G8250 Quadriplegia, unspecified: Secondary | ICD-10-CM | POA: Diagnosis not present

## 2015-06-18 DIAGNOSIS — G40909 Epilepsy, unspecified, not intractable, without status epilepticus: Secondary | ICD-10-CM | POA: Diagnosis not present

## 2015-06-18 DIAGNOSIS — I252 Old myocardial infarction: Secondary | ICD-10-CM | POA: Diagnosis not present

## 2015-06-18 DIAGNOSIS — I10 Essential (primary) hypertension: Secondary | ICD-10-CM | POA: Insufficient documentation

## 2015-06-18 DIAGNOSIS — L89224 Pressure ulcer of left hip, stage 4: Secondary | ICD-10-CM | POA: Diagnosis not present

## 2015-06-18 DIAGNOSIS — M1990 Unspecified osteoarthritis, unspecified site: Secondary | ICD-10-CM | POA: Diagnosis not present

## 2015-06-18 DIAGNOSIS — F0280 Dementia in other diseases classified elsewhere without behavioral disturbance: Secondary | ICD-10-CM | POA: Diagnosis not present

## 2015-06-19 DIAGNOSIS — L89153 Pressure ulcer of sacral region, stage 3: Secondary | ICD-10-CM

## 2015-06-19 DIAGNOSIS — L89223 Pressure ulcer of left hip, stage 3: Secondary | ICD-10-CM

## 2015-06-19 DIAGNOSIS — G309 Alzheimer's disease, unspecified: Secondary | ICD-10-CM

## 2015-06-19 DIAGNOSIS — F0280 Dementia in other diseases classified elsewhere without behavioral disturbance: Secondary | ICD-10-CM

## 2015-06-21 ENCOUNTER — Ambulatory Visit (INDEPENDENT_AMBULATORY_CARE_PROVIDER_SITE_OTHER): Payer: Medicare Other | Admitting: Internal Medicine

## 2015-06-21 ENCOUNTER — Encounter: Payer: Self-pay | Admitting: Internal Medicine

## 2015-06-21 VITALS — BP 100/72 | HR 82 | Temp 97.7°F | Resp 20 | Ht 61.0 in

## 2015-06-21 DIAGNOSIS — E43 Unspecified severe protein-calorie malnutrition: Secondary | ICD-10-CM | POA: Diagnosis not present

## 2015-06-21 DIAGNOSIS — T07XXXA Unspecified multiple injuries, initial encounter: Secondary | ICD-10-CM

## 2015-06-21 DIAGNOSIS — F0280 Dementia in other diseases classified elsewhere without behavioral disturbance: Secondary | ICD-10-CM

## 2015-06-21 DIAGNOSIS — Z7901 Long term (current) use of anticoagulants: Secondary | ICD-10-CM | POA: Diagnosis not present

## 2015-06-21 DIAGNOSIS — T07 Unspecified multiple injuries: Secondary | ICD-10-CM | POA: Diagnosis not present

## 2015-06-21 DIAGNOSIS — Z960 Presence of urogenital implants: Secondary | ICD-10-CM

## 2015-06-21 DIAGNOSIS — I2699 Other pulmonary embolism without acute cor pulmonale: Secondary | ICD-10-CM

## 2015-06-21 DIAGNOSIS — Z978 Presence of other specified devices: Secondary | ICD-10-CM

## 2015-06-21 DIAGNOSIS — Z9889 Other specified postprocedural states: Secondary | ICD-10-CM | POA: Diagnosis not present

## 2015-06-21 DIAGNOSIS — G309 Alzheimer's disease, unspecified: Secondary | ICD-10-CM

## 2015-06-21 NOTE — Progress Notes (Signed)
Patient ID: Sabrina Long, female   DOB: 12/19/1935, 79 y.o.   MRN: [id number]   Location:  [facility]  Code Status: DNR Goals of Care: Advanced Directive information Does patient have an advance directive?: Yes  Chief Complaint  Patient presents with  . Medical Management of Chronic Issues    3 month folow-up     HPI: Patient is a 79 y.o. black female seen in the office today for med mgt of chronic diseases.  She is here with her son.  She was recently hospitalized again but in [facility]--has the sacral wound, hip wound that were not healing.  Her son tells me he was turning her and there was a lot of undermining.  He got with Dr. Leanord Hawking and they had her at the wound care center 8/9 and then d/c'd home.  Wounds are now large, but they look much better after extensive debridement.  She has multiple decubitus ulcers due to end stage dementia.  Family remains adamant that they do not want hospice care.  She has pain due to these on her buttocks.  Sees wound clinic each Friday.  Son says insurance made him bring her here today.  VS have been "good", glucose "good".  Is losing weight due to her terminal dementia.  He is feeding her three full meals per day and she's having regular bms.  Night person helps turn her.  WCC said they may try a vac on the wounds in four more weeks.  Has been tried on tylenol #3, vicodin and now on oxycodone for pain 1/2 tab in 3 days.  Dr at wound care center did not want to prescribe the narcotics and wants me to do it.  Explained that we would need her son to complete the contract here and that the script must be picked up monthly and taken to the same pharmacy.    Her son admits she won't be here forever, but he's not ready to lose her yet.      INR is due tomorrow by home health.  Current dose is 5mg  alternating with 7.5mg  as she was low on 5mg  daily and too high on 7.5mg  daily.    Now has foley catheter in place to keep  wounds clean.    Given 25 pills of oxycodone on Thursday of last week by wound care doctor.  Has 7 pills left.  9/9 is last day he'll have pills.  Her son will need to come on that Friday to pick up the Rx.       Review of Systems:  Review of Systems  Unable to perform ROS: patient nonverbal  due to terminal dementia  Past Medical History  Diagnosis Date  . Unspecified constipation   . Anxiety state, unspecified   . Colitis, enteritis, and gastroenteritis of presumed infectious origin 11/02/2011  . Pneumonia, organism unspecified 11/02/2011  . Cystitis, unspecified   . Impetigo   . Renal artery stenosis   . Dementia in conditions classified elsewhere with behavioral disturbance   . Hypercalcemia   . Reflux esophagitis   . Chest pain, unspecified   . Abnormality of gait   . Hyperosmolality and/or hypernatremia   . Unspecified disorder of liver   . Candidiasis of vulva and vagina   . Alzheimer's disease   . Chest pain, unspecified 08/19/2008  . Dementia in conditions classified elsewhere without behavioral disturbance   . Depressive disorder, not elsewhere classified   . Esophageal  reflux   . Other and unspecified hyperlipidemia   . Loss of weight   . Unspecified persistent mental disorders due to conditions classified elsewhere   . Reactive confusion   . Unspecified essential hypertension   . Other seborrheic keratosis   . Osteoarthrosis, unspecified whether generalized or localized, unspecified site   . Stiffness of joint, not elsewhere classified, unspecified site   . Memory loss 07/24/2007  . Headache(784.0)   . Undiagnosed cardiac murmurs   . Seizures     Past Surgical History  Procedure Laterality Date  . Cesarean section      Allergies  Allergen Reactions  . Risperdal [Risperidone] Other (See Comments)    sedation   Medications: Patient's Medications  New Prescriptions   No medications on file  Previous Medications   ALENDRONATE (FOSAMAX) 70 MG TABLET     TAKE 1 TABLET ONCE A WEEK ON TUESDAYS. TAKE WITH A FULL GLASS OF WATER ON AN EMPTY STOMACH.   ALPRAZOLAM (XANAX) 0.5 MG TABLET    Take one tablet by mouth three times daily and one tablets at bedtime as needed for anxiety   AMBULATORY NON FORMULARY MEDICATION    Air Mattress with Gel Overlay Dx: T07, L89.221, L89.150, L89.510   CIPROFLOXACIN (CIPRO) 250 MG TABLET    Take 1 tablet (250 mg total) by mouth 2 (two) times daily.   DICLOFENAC SODIUM (VOLTAREN) 1 % GEL    Apply 1 application topically 2 (two) times daily. Apply to knees and hips   FEEDING SUPPLEMENT, ENSURE ENLIVE, (ENSURE ENLIVE) LIQD    Take 237 mLs by mouth 3 (three) times daily between meals.   HYDRALAZINE (APRESOLINE) 50 MG TABLET    Take 50 mg by mouth 3 (three) times daily.   LEVETIRACETAM (KEPPRA) 500 MG TABLET    Take 1 tablet (500 mg total) by mouth 2 (two) times daily.   LOSARTAN (COZAAR) 25 MG TABLET    TAKE 1 TABLET DAILY FOR BLOOD PRESSURE   MEMANTINE (NAMENDA) 10 MG TABLET    TAKE 1 TABLET TWICE A DAY FOR MEMORY LOSS   METOPROLOL TARTRATE (LOPRESSOR) 25 MG TABLET    Take one tablet by mouth twice daily to control pulse   NITROGLYCERIN (NITROSTAT) 0.4 MG SL TABLET    Place 0.4 mg under the tongue every 5 (five) minutes as needed for chest pain. Take one tablet sublingual every 5 minutes not to exceed three tablets for chest pain   OMEPRAZOLE (PRILOSEC) 20 MG CAPSULE    TAKE ONE CAPSULE BY MOUTH EVERY DAY FOR REFLUX   OXYCODONE (OXY IR/ROXICODONE) 5 MG IMMEDIATE RELEASE TABLET    Take 1/2  Tablet 3 times a day as needed   ROSUVASTATIN (CRESTOR) 10 MG TABLET    Take 10 mg by mouth daily.   SACCHAROMYCES BOULARDII (FLORASTOR) 250 MG CAPSULE    Take 1 capsule (250 mg total) by mouth 2 (two) times daily.   SERTRALINE (ZOLOFT) 100 MG TABLET    Take 100 mg by mouth at bedtime.   WARFARIN (COUMADIN) 2 MG TABLET    Take 1 tablet by mouth every other day   WARFARIN (COUMADIN) 5 MG TABLET    TAKE 1 TABLET EVERY DAY WITH 2MG  TO EQUAL  7MG  DAILY  Modified Medications   No medications on file  Discontinued Medications   LOSARTAN (COZAAR) 25 MG TABLET    Take 25 mg by mouth daily.   MEMANTINE (NAMENDA) 10 MG TABLET    Take 10 mg by  mouth 2 (two) times daily.   METOPROLOL TARTRATE (LOPRESSOR) 25 MG TABLET    TAKE ONE-HALF (1/2) TABLET TWICE A DAY   OMEPRAZOLE (PRILOSEC) 20 MG CAPSULE    Take 20 mg by mouth daily.   OMEPRAZOLE (PRILOSEC) 20 MG CAPSULE    TAKE ONE CAPSULE BY MOUTH EVERY DAY FOR REFLUX   OMEPRAZOLE (PRILOSEC) 20 MG CAPSULE    TAKE ONE CAPSULE BY MOUTH EVERY DAY FOR REFLUX   WARFARIN (COUMADIN) 6 MG TABLET    Take 1 tablet (6 mg total) by mouth daily.    Physical Exam: Filed Vitals:   06/21/15 1304  BP: 100/72  Pulse: 82  Temp: 97.7 F (36.5 C)  TempSrc: Oral  Resp: 20  Height:  (1.549 m)  SpO2: 97%   Physical Exam  Constitutional:  Cachectic black female sitting up in wheelchair, smells of urine  Cardiovascular: Normal rate, regular rhythm and normal heart sounds.   Pulmonary/Chest: Effort normal and breath sounds normal.  Abdominal: Soft. Bowel sounds are normal.  Genitourinary:  Foley catheter in place with yellow urine  Musculoskeletal:  Developing worsening contractures of upper and lower extremities  Neurological:  Follows me with her eyes minimally when awakened  Skin:  Multiple pressure ulcers being managed at wound care center    Labs reviewed: Basic Metabolic Panel:  Recent Labs  45/40/98 0315 02/17/15 0225 02/18/15 0309  NA 140 139 141  K 3.3* 3.8 4.1  CL 106 108 107  CO2 GLUCOSE 100* 88 85  BUN <5* <5* 6  CREATININE 0.40* 0.43* 0.41*  CALCIUM 9.7 9.3 9.2   Liver Function Tests:  Recent Labs  12/18/14 1057 02/15/15 1140  AST 21 32  ALT 9 12  ALKPHOS 84 64  BILITOT <0.2 0.3  PROT 6.2 7.0  ALBUMIN  --  3.0*    Recent Labs  02/16/15 0315  LIPASE 22   No results for input(s): AMMONIA in the last 8760 hours. CBC:  Recent Labs   07/27/14 1337  12/18/14 1057 02/15/15 1140  02/16/15 0315 02/17/15 0225 02/18/15 0309  WBC 8.7  < > 6.1 7.1  --  10.1 6.6 6.7  NEUTROABS 5.6  --  3.5 5.1  --   --   --   --   HGB 12.2  < > 11.5 10.9*  < > 10.2* 9.3* 8.9*  HCT 37.7  < > 36.0 34.1*  < > 32.1* 29.3* 28.6*  MCV 92.9  < > 94 94.2  --  92.2 92.1 93.2  PLT 285  < > 415* 326  --  376 303 307  < > = values in this interval not displayed. Lipid Panel: No results for input(s): CHOL, HDL, LDLCALC, TRIG, CHOLHDL, LDLDIRECT in the last 8760 hours. Lab Results  Component Value Date   HGBA1C 5.5 02/15/2015    Procedures since last visit: [facility] hospital record reviewed  Assessment/Plan 1. Multiple open wounds -pressure ulcers due to terminal dementia -her family refuses hospice care -she continues to live with her son and he has assistance with her care at night -wounds are being managed at wound care center where they were debrided--she now has increased pain and I've been asked to managed it with the pain meds they originally prescribed there -agreed if pt's son signs narcotic agreement, Rx to be picked up monthly and filled at same pharmacy  2. Alzheimer's disease -end stage  -I have recommended hospice for a long time now, but family  is not ready -is not ambulatory, nonverbal with multiple pressure ulcers, now has pain from wounds on buttock/hip, has foley and bound to get recurrent infections from it and family wants to continue to hospitalize and treat her  -son feels her quality of life is still good  3. Protein-calorie malnutrition, severe -is eating and drinking per son, but continues to lose weight and very cachectic even with supplements due to terminal dementia  4. Pulmonary embolism, bilateral -remains on anticoagulation with coumadin--has been difficult to regulate with missed doses, missed home health visits and frequent hospital trips  5. Current use of long term anticoagulation -cont coumadin 5mg   alternating with 7.5mg  with next INR tomorrow by home health RN  6. Foley catheter in place -to keep wounds clean -will increase risk of UTIs and hospitalizations; however -cont per [facility]  Labs/tests ordered:  No new--avoid sticking pt--is having enough blood tests and surgical procedures considering her poor prognosis and functionals state  Next appt:  3 mos for med mgt; son to return 9/9 for narcotic Rx and to complete contract  Jere Bostrom L. Makyra Corprew, D.O. Geriatrics [facility] [address], [place] [address] Cell Phone (Mon-Fri 8am-5pm):  [phone] On Call:  [phone] & follow prompts after 5pm & weekends Office Phone:  [phone] Office Fax:  [phone]

## 2015-06-22 ENCOUNTER — Telehealth: Payer: Self-pay | Admitting: *Deleted

## 2015-06-22 ENCOUNTER — Inpatient Hospital Stay (HOSPITAL_COMMUNITY)
Admission: EM | Admit: 2015-06-22 | Discharge: 2015-06-25 | DRG: 698 | Disposition: A | Discharge status: Home Health | Payer: Medicare Other | Attending: Internal Medicine | Admitting: Internal Medicine

## 2015-06-22 ENCOUNTER — Encounter (HOSPITAL_COMMUNITY): Payer: Self-pay | Admitting: *Deleted

## 2015-06-22 DIAGNOSIS — F0391 Unspecified dementia with behavioral disturbance: Secondary | ICD-10-CM | POA: Diagnosis present

## 2015-06-22 DIAGNOSIS — E860 Dehydration: Secondary | ICD-10-CM | POA: Diagnosis present

## 2015-06-22 DIAGNOSIS — I959 Hypotension, unspecified: Secondary | ICD-10-CM | POA: Diagnosis present

## 2015-06-22 DIAGNOSIS — Z7401 Bed confinement status: Secondary | ICD-10-CM | POA: Diagnosis not present

## 2015-06-22 DIAGNOSIS — L89223 Pressure ulcer of left hip, stage 3: Secondary | ICD-10-CM | POA: Diagnosis present

## 2015-06-22 DIAGNOSIS — Y846 Urinary catheterization as the cause of abnormal reaction of the patient, or of later complication, without mention of misadventure at the time of the procedure: Secondary | ICD-10-CM | POA: Diagnosis present

## 2015-06-22 DIAGNOSIS — Z79899 Other long term (current) drug therapy: Secondary | ICD-10-CM

## 2015-06-22 DIAGNOSIS — R627 Adult failure to thrive: Secondary | ICD-10-CM | POA: Diagnosis present

## 2015-06-22 DIAGNOSIS — G9340 Encephalopathy, unspecified: Secondary | ICD-10-CM

## 2015-06-22 DIAGNOSIS — Z86711 Personal history of pulmonary embolism: Secondary | ICD-10-CM

## 2015-06-22 DIAGNOSIS — K219 Gastro-esophageal reflux disease without esophagitis: Secondary | ICD-10-CM | POA: Diagnosis present

## 2015-06-22 DIAGNOSIS — I10 Essential (primary) hypertension: Secondary | ICD-10-CM | POA: Diagnosis present

## 2015-06-22 DIAGNOSIS — T8351XA Infection and inflammatory reaction due to indwelling urinary catheter, initial encounter: Principal | ICD-10-CM | POA: Diagnosis present

## 2015-06-22 DIAGNOSIS — N3000 Acute cystitis without hematuria: Secondary | ICD-10-CM | POA: Diagnosis not present

## 2015-06-22 DIAGNOSIS — L89510 Pressure ulcer of right ankle, unstageable: Secondary | ICD-10-CM | POA: Diagnosis present

## 2015-06-22 DIAGNOSIS — Z86718 Personal history of other venous thrombosis and embolism: Secondary | ICD-10-CM

## 2015-06-22 DIAGNOSIS — Z888 Allergy status to other drugs, medicaments and biological substances status: Secondary | ICD-10-CM

## 2015-06-22 DIAGNOSIS — L89154 Pressure ulcer of sacral region, stage 4: Secondary | ICD-10-CM | POA: Diagnosis present

## 2015-06-22 DIAGNOSIS — E785 Hyperlipidemia, unspecified: Secondary | ICD-10-CM | POA: Diagnosis present

## 2015-06-22 DIAGNOSIS — E870 Hyperosmolality and hypernatremia: Secondary | ICD-10-CM | POA: Diagnosis present

## 2015-06-22 DIAGNOSIS — G309 Alzheimer's disease, unspecified: Secondary | ICD-10-CM | POA: Diagnosis present

## 2015-06-22 DIAGNOSIS — Y929 Unspecified place or not applicable: Secondary | ICD-10-CM | POA: Diagnosis not present

## 2015-06-22 DIAGNOSIS — N390 Urinary tract infection, site not specified: Secondary | ICD-10-CM | POA: Diagnosis present

## 2015-06-22 DIAGNOSIS — E872 Acidosis: Secondary | ICD-10-CM | POA: Diagnosis present

## 2015-06-22 DIAGNOSIS — F329 Major depressive disorder, single episode, unspecified: Secondary | ICD-10-CM | POA: Diagnosis present

## 2015-06-22 DIAGNOSIS — F03918 Unspecified dementia, unspecified severity, with other behavioral disturbance: Secondary | ICD-10-CM | POA: Diagnosis present

## 2015-06-22 DIAGNOSIS — Z66 Do not resuscitate: Secondary | ICD-10-CM | POA: Diagnosis present

## 2015-06-22 DIAGNOSIS — Z7901 Long term (current) use of anticoagulants: Secondary | ICD-10-CM

## 2015-06-22 DIAGNOSIS — F0280 Dementia in other diseases classified elsewhere without behavioral disturbance: Secondary | ICD-10-CM | POA: Diagnosis present

## 2015-06-22 DIAGNOSIS — Z515 Encounter for palliative care: Secondary | ICD-10-CM | POA: Diagnosis not present

## 2015-06-22 DIAGNOSIS — E876 Hypokalemia: Secondary | ICD-10-CM | POA: Diagnosis not present

## 2015-06-22 LAB — URINALYSIS, ROUTINE W REFLEX MICROSCOPIC
BILIRUBIN URINE: NEGATIVE
Glucose, UA: NEGATIVE mg/dL
Ketones, ur: NEGATIVE mg/dL
NITRITE: NEGATIVE
Protein, ur: 30 mg/dL — AB
SPECIFIC GRAVITY, URINE: 1.019 (ref 1.005–1.030)
UROBILINOGEN UA: 0.2 mg/dL (ref 0.0–1.0)
pH: 6 (ref 5.0–8.0)

## 2015-06-22 LAB — COMPREHENSIVE METABOLIC PANEL
ALT: 27 U/L (ref 14–54)
AST: 24 U/L (ref 15–41)
Albumin: 3.3 g/dL — ABNORMAL LOW (ref 3.5–5.0)
Alkaline Phosphatase: 76 U/L (ref 38–126)
Anion gap: 9 (ref 5–15)
BUN: 31 mg/dL — AB (ref 6–20)
CALCIUM: 9.6 mg/dL (ref 8.9–10.3)
CHLORIDE: 111 mmol/L (ref 101–111)
CO2: 26 mmol/L (ref 22–32)
CREATININE: 0.62 mg/dL (ref 0.44–1.00)
GFR calc Af Amer: >60 mL/min (ref >60)
GFR calc non Af Amer: >60 mL/min (ref >60)
GLUCOSE: 168 mg/dL — AB (ref 65–99)
Potassium: 3.5 mmol/L (ref 3.5–5.1)
SODIUM: 146 mmol/L — AB (ref 135–145)
Total Bilirubin: 0.4 mg/dL (ref 0.3–1.2)
Total Protein: 8.2 g/dL — ABNORMAL HIGH (ref 6.5–8.1)

## 2015-06-22 LAB — URINE MICROSCOPIC-ADD ON

## 2015-06-22 LAB — CBC WITH DIFFERENTIAL/PLATELET
Basophils Absolute: 0 10*3/uL (ref 0.0–0.1)
Basophils Relative: 0 % (ref 0–1)
EOS ABS: 0 10*3/uL (ref 0.0–0.7)
Eosinophils Relative: 0 % (ref 0–5)
HCT: 36.2 % (ref 36.0–46.0)
Hemoglobin: 10.9 g/dL — ABNORMAL LOW (ref 12.0–15.0)
LYMPHS PCT: 29 % (ref 12–46)
Lymphs Abs: 1.9 10*3/uL (ref 0.7–4.0)
MCH: 28.7 pg (ref 26.0–34.0)
MCHC: 30.1 g/dL (ref 30.0–36.0)
MCV: 95.3 fL (ref 78.0–100.0)
Monocytes Absolute: 0.5 10*3/uL (ref 0.1–1.0)
Monocytes Relative: 7 % (ref 3–12)
NEUTROS PCT: 64 % (ref 43–77)
Neutro Abs: 4.3 10*3/uL (ref 1.7–7.7)
Platelets: 341 10*3/uL (ref 150–400)
RBC: 3.8 MIL/uL — AB (ref 3.87–5.11)
RDW: 16.8 % — ABNORMAL HIGH (ref 11.5–15.5)
WBC: 6.7 10*3/uL (ref 4.0–10.5)

## 2015-06-22 LAB — PROTIME-INR
INR: 2.25 — AB (ref 0.00–1.49)
PROTHROMBIN TIME: 24.7 s — AB (ref 11.6–15.2)

## 2015-06-22 LAB — LACTIC ACID, PLASMA: Lactic Acid, Venous: 2.4 mmol/L (ref 0.5–2.0)

## 2015-06-22 MED ORDER — BOOST PO LIQD
237.0000 mL | Freq: Two times a day (BID) | ORAL | Status: DC
  Administered 2015-06-22 – 2015-06-25 (×6): 237 mL via ORAL
  Filled 2015-06-22 (×7): qty 237

## 2015-06-22 MED ORDER — OXYCODONE HCL 5 MG PO TABS: 2.5000 mg | ORAL_TABLET | Freq: Three times a day (TID) | ORAL | Status: DC | PRN

## 2015-06-22 MED ORDER — SODIUM CHLORIDE 0.9 % IV SOLN
INTRAVENOUS | Status: DC
  Administered 2015-06-22 – 2015-06-24 (×4): via INTRAVENOUS

## 2015-06-22 MED ORDER — DICLOFENAC SODIUM 1 % TD GEL
2.0000 g | Freq: Two times a day (BID) | TRANSDERMAL | Status: DC
  Administered 2015-06-23 – 2015-06-25 (×6): 2 g via TOPICAL
  Filled 2015-06-22 (×2): qty 100

## 2015-06-22 MED ORDER — SODIUM CHLORIDE 0.9 % IJ SOLN
3.0000 mL | Freq: Two times a day (BID) | INTRAMUSCULAR | Status: DC
  Administered 2015-06-24: 3 mL via INTRAVENOUS

## 2015-06-22 MED ORDER — WARFARIN - PHARMACIST DOSING INPATIENT: Freq: Every day | Status: DC

## 2015-06-22 MED ORDER — INFLUENZA VAC SPLIT QUAD 0.5 ML IM SUSY
0.5000 mL | PREFILLED_SYRINGE | INTRAMUSCULAR | Status: AC
  Administered 2015-06-23: 0.5 mL via INTRAMUSCULAR
  Filled 2015-06-22 (×2): qty 0.5

## 2015-06-22 MED ORDER — SODIUM CHLORIDE 0.9 % IV SOLN
INTRAVENOUS | Status: DC
Start: 2015-06-22 — End: 2015-06-22

## 2015-06-22 MED ORDER — SACCHAROMYCES BOULARDII 250 MG PO CAPS
250.0000 mg | ORAL_CAPSULE | Freq: Two times a day (BID) | ORAL | Status: DC
  Administered 2015-06-22 – 2015-06-25 (×6): 250 mg via ORAL
  Filled 2015-06-22 (×6): qty 1

## 2015-06-22 MED ORDER — WARFARIN SODIUM 5 MG PO TABS
5.0000 mg | ORAL_TABLET | Freq: Once | ORAL | Status: AC
  Administered 2015-06-22: 5 mg via ORAL
  Filled 2015-06-22: qty 1

## 2015-06-22 MED ORDER — SODIUM CHLORIDE 0.9 % IV BOLUS (SEPSIS)
500.0000 mL | Freq: Once | INTRAVENOUS | Status: AC
  Administered 2015-06-22: 500 mL via INTRAVENOUS

## 2015-06-22 MED ORDER — PANTOPRAZOLE SODIUM 40 MG PO TBEC
40.0000 mg | DELAYED_RELEASE_TABLET | Freq: Every day | ORAL | Status: DC
  Administered 2015-06-23 – 2015-06-25 (×3): 40 mg via ORAL
  Filled 2015-06-22 (×4): qty 1

## 2015-06-22 MED ORDER — MEMANTINE HCL 10 MG PO TABS
10.0000 mg | ORAL_TABLET | Freq: Two times a day (BID) | ORAL | Status: DC
  Administered 2015-06-22 – 2015-06-25 (×6): 10 mg via ORAL
  Filled 2015-06-22 (×6): qty 1

## 2015-06-22 MED ORDER — ONDANSETRON HCL 4 MG/2ML IJ SOLN
4.0000 mg | Freq: Once | INTRAMUSCULAR | Status: AC
  Administered 2015-06-22: 4 mg via INTRAVENOUS
  Filled 2015-06-22: qty 2

## 2015-06-22 MED ORDER — ROSUVASTATIN CALCIUM 10 MG PO TABS
10.0000 mg | ORAL_TABLET | Freq: Every day | ORAL | Status: DC
  Administered 2015-06-23 – 2015-06-24 (×2): 10 mg via ORAL
  Filled 2015-06-22 (×3): qty 1

## 2015-06-22 MED ORDER — LEVETIRACETAM 500 MG PO TABS
500.0000 mg | ORAL_TABLET | Freq: Two times a day (BID) | ORAL | Status: DC
  Administered 2015-06-22 – 2015-06-23 (×3): 500 mg via ORAL
  Filled 2015-06-22 (×5): qty 1

## 2015-06-22 MED ORDER — CEFTRIAXONE SODIUM 1 G IJ SOLR
1.0000 g | Freq: Once | INTRAMUSCULAR | Status: AC
  Administered 2015-06-22: 1 g via INTRAVENOUS
  Filled 2015-06-22: qty 10

## 2015-06-22 MED ORDER — DEXTROSE 5 % IV SOLN
1.0000 g | INTRAVENOUS | Status: DC
  Administered 2015-06-23 – 2015-06-25 (×3): 1 g via INTRAVENOUS
  Filled 2015-06-22 (×3): qty 10

## 2015-06-22 NOTE — H&P (Signed)
History and Physical    Sabrina Long ZOX:[id number] DOB: Jan 17, 1936 DOA: 06/22/2015  Referring physician: Dr. Jodi Mourning PCP: Bufford Spikes, DO  Specialists: none  Chief Complaint: fever  HPI: Sabrina Long is a 79 y.o. female has a past medical history significant for end-stage dementia, bedbound with a chronic Foley catheter, sacral and left hip decubitus ulcers which are cared for as an outpatient, she is status post a 4 week hospitalization at [facility], hypertension, hyperlipidemia, presents to the emergency room with a chief complaint fever. She was brought in by her son as the patient is living with them. In the ED, patient is nonverbal, her eyes are closed and she is not following any commands so the story is per her son. He states that she has been having fever and elevated blood pressure. He tells me that at baseline, she opens her eyes, eats very well and is mostly nonverbal. He denies hearing any cough or chest congestion, he tells me that she has had nausea and vomiting today. In the ED, patient is afebrile, no leukocytosis and a UA grossly positive for infection. [facility] was asked for admission for UTI.   Review of Systems: unable to obtain ROS due to underlying dementia  Past Medical History  Diagnosis Date  . Unspecified constipation   . Anxiety state, unspecified   . Colitis, enteritis, and gastroenteritis of presumed infectious origin 11/02/2011  . Pneumonia, organism unspecified 11/02/2011  . Cystitis, unspecified   . Impetigo   . Renal artery stenosis   . Dementia in conditions classified elsewhere with behavioral disturbance   . Hypercalcemia   . Reflux esophagitis   . Chest pain, unspecified   . Abnormality of gait   . Hyperosmolality and/or hypernatremia   . Unspecified disorder of liver   . Candidiasis of vulva and vagina   . Alzheimer's disease   . Chest pain, unspecified 08/19/2008  . Dementia in conditions classified elsewhere without behavioral  disturbance   . Depressive disorder, not elsewhere classified   . Esophageal reflux   . Other and unspecified hyperlipidemia   . Loss of weight   . Unspecified persistent mental disorders due to conditions classified elsewhere   . Reactive confusion   . Unspecified essential hypertension   . Other seborrheic keratosis   . Osteoarthrosis, unspecified whether generalized or localized, unspecified site   . Stiffness of joint, not elsewhere classified, unspecified site   . Memory loss 07/24/2007  . Headache(784.0)   . Undiagnosed cardiac murmurs   . Seizures    Past Surgical History  Procedure Laterality Date  . Cesarean section     Social History:  reports that she has never smoked. She has never used smokeless tobacco. She reports that she does not drink alcohol or use illicit drugs.  Allergies  Allergen Reactions  . Risperdal [Risperidone] Other (See Comments)    sedation   Family history reviewed and non contributory   Prior to Admission medications   Medication Sig Start Date End Date Taking? Authorizing Provider  alendronate (FOSAMAX) 70 MG tablet TAKE 1 TABLET ONCE A WEEK ON TUESDAYS. TAKE WITH A FULL GLASS OF WATER ON AN EMPTY STOMACH. 11/09/14  Yes [name] L Reed, DO  ALPRAZolam Prudy Feeler) 0.5 MG tablet Take one tablet by mouth three times daily and one tablets at bedtime as needed for anxiety Patient taking differently: Take 0.25 mg by mouth 3 (three) times daily as needed for anxiety. Take one tablet by mouth three times daily and  one tablets at bedtime as needed for anxiety 04/05/15  Yes [name] L Reed, DO  ciprofloxacin (CIPRO) 250 MG tablet Take 1 tablet (250 mg total) by mouth 2 (two) times daily. Patient taking differently: Take 250 mg by mouth 2 (two) times daily. For 10 days 8-24 to 9-2 06/16/15  Yes Kirt Boys, DO  diclofenac sodium (VOLTAREN) 1 % GEL Apply 1 application topically 2 (two) times daily. Apply to knees and hips   Yes Historical Provider, MD  hydrALAZINE  (APRESOLINE) 50 MG tablet Take 50 mg by mouth 3 (three) times daily.   Yes Historical Provider, MD  lactose free nutrition (BOOST) LIQD Take 237 mLs by mouth 2 (two) times daily between meals.   Yes Historical Provider, MD  levETIRAcetam (KEPPRA) 500 MG tablet Take 1 tablet (500 mg total) by mouth 2 (two) times daily. 02/17/15  Yes Albertine Grates, MD  losartan (COZAAR) 25 MG tablet TAKE 1 TABLET DAILY FOR BLOOD PRESSURE 04/20/15  Yes [name] L Reed, DO  memantine (NAMENDA) 10 MG tablet TAKE 1 TABLET TWICE A DAY FOR MEMORY LOSS 05/06/15  Yes [name] L Reed, DO  metoprolol tartrate (LOPRESSOR) 25 MG tablet Take one tablet by mouth twice daily to control pulse Patient taking differently: Take 12.5 mg by mouth 2 (two) times daily.  04/14/15  Yes [name] L Reed, DO  nitroGLYCERIN (NITROSTAT) 0.4 MG SL tablet Place 0.4 mg under the tongue every 5 (five) minutes as needed for chest pain. Take one tablet sublingual every 5 minutes not to exceed three tablets for chest pain   Yes Historical Provider, MD  omeprazole (PRILOSEC) 20 MG capsule TAKE ONE CAPSULE BY MOUTH EVERY DAY FOR REFLUX 06/14/15  Yes [name] L Reed, DO  oxyCODONE (OXY IR/ROXICODONE) 5 MG immediate release tablet Take 2.5 mg by mouth 3 (three) times daily as needed for severe pain.    Yes Historical Provider, MD  rosuvastatin (CRESTOR) 10 MG tablet Take 10 mg by mouth daily.   Yes Historical Provider, MD  saccharomyces boulardii (FLORASTOR) 250 MG capsule Take 1 capsule (250 mg total) by mouth 2 (two) times daily. 06/16/15  Yes Kirt Boys, DO  warfarin (COUMADIN) 2 MG tablet Take 2 mg by mouth every other day. With 5mg  tablet   Yes Historical Provider, MD  warfarin (COUMADIN) 5 MG tablet TAKE 1 TABLET EVERY DAY WITH 2MG  TO EQUAL 7MG  DAILY Patient taking differently: TAKE 1 TABLET EVERY DAY 06/18/15  Yes [name] L Reed, DO  feeding supplement, ENSURE ENLIVE, (ENSURE ENLIVE) LIQD Take 237 mLs by mouth 3 (three) times daily between meals. Patient not taking:  Reported on 06/22/2015 02/17/15   Albertine Grates, MD   Physical Exam: Filed Vitals:   06/22/15 1129  Pulse: 116  Temp: 98.6 F (37 C)  TempSrc: Oral  SpO2: 100%     GENERAL: NAD, eyes closed  HEENT: head NCAT, refuses to open her eyes  NECK: Supple.   LUNGS: Clear to auscultation. No wheezing or crackles  HEART: Regular rate and rhythm without murmur. 2+ pulses, no JVD, no peripheral edema  ABDOMEN: Soft, patient grimaces on lower abdomen palpation, and nondistended. Positive bowel sounds.  EXTREMITIES: Without any cyanosis  NEUROLOGIC: non verbal. Does not follow commands  PSYCHIATRIC: non verbal  SKIN: 3-4 cm sacral decubitus wound, without purulence,without surrounding cellulitis, left hip wound with packing in place   Labs on Admission:  Basic Metabolic Panel:  Recent Labs Lab 06/22/15 1320  NA 146*  K 3.5  CL 111  CO2 26  GLUCOSE 168*  BUN 31*  CREATININE 0.62  CALCIUM 9.6   Liver Function Tests:  Recent Labs Lab 06/22/15 1320  AST 24  ALT 27  ALKPHOS 76  BILITOT 0.4  PROT 8.2*  ALBUMIN 3.3*   CBC:  Recent Labs Lab 06/22/15 1320  WBC 6.7  NEUTROABS 4.3  HGB 10.9*  HCT 36.2  MCV 95.3  PLT 341   EKG: not done  Assessment/Plan Principal Problem:   UTI (urinary tract infection) Active Problems:   Dementia with behavioral disturbance   Hyperlipidemia with target LDL less than 100   Hypernatremia   FTT (failure to thrive) in adult   Alzheimer's disease   UTI (lower urinary tract infection)   Pressure ulcer   History of pulmonary embolus (PE)   UTI - In the setting of bedbound state and chronic Foley catheter, she was febrile at home, we'll start empiric ceftriaxone and obtain urine cultures.  Sacral decubitus and left hip ulcers - wound care consulted - these appear to be well cared for as an outpatient - do not appear infected  History of PE/DVT - continue coumadin   FTT - nutrition consult - SLP evaluation - seen by  palliative in the past, stated goal at that time was comfort only, consider re-consulting in am  Dementia - end stage  HTN - hold home antihypertensives for now, BP in the ED 90s systolic - IVF  Hypernatremia - likely due to dehydration - IVF, monitor  HLD - continue statin   Diet: dysphagia 1 Fluids: NS DVT Prophylaxis: on Coumadin  Code Status: DNR  Family Communication: d/w son bedside  Disposition Plan: admit   Jashawn Floyd M. Elvera Lennox, MD [facility] Pager [phone]  If 7PM-7AM, please contact night-coverage [email or web address] Password [email or web address] 06/22/2015, 3:02 PM

## 2015-06-22 NOTE — ED Notes (Signed)
Patient comes from home with a indwelling catheter. She was diagnosed with a uti 5 days ago and is being treated with antibiotics. The patient was hypertensive and febrile at home. Patient's family wanted her evaluated.

## 2015-06-22 NOTE — ED Notes (Signed)
Sabrina Long (son) [phone]

## 2015-06-22 NOTE — ED Notes (Signed)
RN at bedside to draw labs

## 2015-06-22 NOTE — Telephone Encounter (Signed)
[name] with [name] called with [facility] Results 06/22/2015   INR:  2.2  Current Dose of Coumadin:  every other day alternating with 7.5mg  Printed and given to Shanda Bumps to review and sign.

## 2015-06-22 NOTE — Progress Notes (Signed)
ANTICOAGULATION CONSULT NOTE - Initial Consult  Pharmacy Consult for warfarin Indication: History of PE  Allergies  Allergen Reactions  . Risperdal [Risperidone] Other (See Comments)    sedation    Patient Measurements: Height: 5\' 1"  (154.9 cm) Weight: 104 lb 11.5 oz (47.5 kg) IBW/kg (Calculated) : 47.8  Vital Signs: Temp: 98.6 Long (37 C) (08/30 1512) Temp Source: Oral (08/30 1512) BP: 154/82 mmHg (08/30 1547) Pulse Rate: 95 (08/30 1547)  Labs:  Recent Labs  06/22/15 1320 06/22/15 1555  HGB 10.9*  --   HCT 36.2  --   PLT 341  --   LABPROT  --  24.7*  INR  --  2.25*  CREATININE 0.62  --     Estimated Creatinine Clearance: 44.2 mL/min (by C-G formula based on Cr of 0.62).   Medical History: Past Medical History  Diagnosis Date  . Unspecified constipation   . Anxiety state, unspecified   . Colitis, enteritis, and gastroenteritis of presumed infectious origin 11/02/2011  . Pneumonia, organism unspecified 11/02/2011  . Cystitis, unspecified   . Impetigo   . Renal artery stenosis   . Dementia in conditions classified elsewhere with behavioral disturbance   . Hypercalcemia   . Reflux esophagitis   . Chest pain, unspecified   . Abnormality of gait   . Hyperosmolality and/or hypernatremia   . Unspecified disorder of liver   . Candidiasis of vulva and vagina   . Alzheimer's disease   . Chest pain, unspecified 08/19/2008  . Dementia in conditions classified elsewhere without behavioral disturbance   . Depressive disorder, not elsewhere classified   . Esophageal reflux   . Other and unspecified hyperlipidemia   . Loss of weight   . Unspecified persistent mental disorders due to conditions classified elsewhere   . Reactive confusion   . Unspecified essential hypertension   . Other seborrheic keratosis   . Osteoarthrosis, unspecified whether generalized or localized, unspecified site   . Stiffness of joint, not elsewhere classified, unspecified site   . Memory  loss 07/24/2007  . Headache(784.0)   . Undiagnosed cardiac murmurs   . Seizures     Medications:  Prescriptions prior to admission  Medication Sig Dispense Refill Last Dose  . alendronate (FOSAMAX) 70 MG tablet TAKE 1 TABLET ONCE A WEEK ON TUESDAYS. TAKE WITH A FULL GLASS OF WATER ON AN EMPTY STOMACH. 12 tablet 3 06/22/2015 at Unknown time  . ALPRAZolam (XANAX) 0.5 MG tablet Take one tablet by mouth three times daily and one tablets at bedtime as needed for anxiety (Patient taking differently: Take 0.25 mg by mouth 3 (three) times daily as needed for anxiety. Take one tablet by mouth three times daily and one tablets at bedtime as needed for anxiety) 120 tablet 3 06/22/2015 at Unknown time  . ciprofloxacin (CIPRO) 250 MG tablet Take 1 tablet (250 mg total) by mouth 2 (two) times daily. (Patient taking differently: Take 250 mg by mouth 2 (two) times daily. For 10 days 8-24 to 9-2) 6 tablet 0 06/22/2015 at Unknown time  . diclofenac sodium (VOLTAREN) 1 % GEL Apply 1 application topically 2 (two) times daily. Apply to knees and hips   06/22/2015 at Unknown time  . hydrALAZINE (APRESOLINE) 50 MG tablet Take 50 mg by mouth 3 (three) times daily.   06/22/2015 at Unknown time  . lactose free nutrition (BOOST) LIQD Take 237 mLs by mouth 2 (two) times daily between meals.   06/21/2015 at Unknown time  . levETIRAcetam (KEPPRA) 500 MG  tablet Take 1 tablet (500 mg total) by mouth 2 (two) times daily. 60 tablet 3 06/22/2015 at 0920  . losartan (COZAAR) 25 MG tablet TAKE 1 TABLET DAILY FOR BLOOD PRESSURE 90 tablet 0 06/22/2015 at Unknown time  . memantine (NAMENDA) 10 MG tablet TAKE 1 TABLET TWICE A DAY FOR MEMORY LOSS 180 tablet 1 06/22/2015 at Unknown time  . metoprolol tartrate (LOPRESSOR) 25 MG tablet Take one tablet by mouth twice daily to control pulse (Patient taking differently: Take 12.5 mg by mouth 2 (two) times daily. ) 60 tablet 3 06/22/2015 at 0920  . nitroGLYCERIN (NITROSTAT) 0.4 MG SL tablet Place 0.4 mg  under the tongue every 5 (five) minutes as needed for chest pain. Take one tablet sublingual every 5 minutes not to exceed three tablets for chest pain   PRN  . omeprazole (PRILOSEC) 20 MG capsule TAKE ONE CAPSULE BY MOUTH EVERY DAY FOR REFLUX 30 capsule 2 06/22/2015 at Unknown time  . oxyCODONE (OXY IR/ROXICODONE) 5 MG immediate release tablet Take 2.5 mg by mouth 3 (three) times daily as needed for severe pain.    06/22/2015 at Unknown time  . rosuvastatin (CRESTOR) 10 MG tablet Take 10 mg by mouth daily.   06/22/2015 at Unknown time  . saccharomyces boulardii (FLORASTOR) 250 MG capsule Take 1 capsule (250 mg total) by mouth 2 (two) times daily. 14 capsule 0 06/22/2015 at Unknown time  . warfarin (COUMADIN) 2 MG tablet Take 2 mg by mouth every other day. With  tablet   06/21/2015 at 2030  . warfarin (COUMADIN) 5 MG tablet TAKE 1 TABLET EVERY DAY WITH  TO EQUAL  DAILY (Patient taking differently: TAKE 1 TABLET EVERY DAY) 30 tablet 2 06/21/2015 at 2030  . feeding supplement, ENSURE ENLIVE, (ENSURE ENLIVE) LIQD Take 237 mLs by mouth 3 (three) times daily between meals. (Patient not taking: Reported on 06/22/2015) 237 mL 12 Taking    Assessment: Sabrina Long w/ end-stage dementia and chronic pressure ulcers from home with reported fever. Suspected UTI. Pharmacy is asked to manage Coumadin.   Reported dose is  alternating with . Last took  on 8/29.  INR is in the therapeutic range on admission.   CBC is ok.   Diet: dysphagia/thin  Drug interactions: Has been taking Cipro since 8/24 which can increase the INR.  Goal of Therapy:  INR 2-3 Monitor platelets by anticoagulation protocol: Yes   Plan:  Give Coumadin  today as per home regimen. Check PT/INR daily.  Charolotte Eke, PharmD, pager [phone]. 06/22/2015,5:13 PM.

## 2015-06-22 NOTE — ED Notes (Signed)
15:20 pt can go to floor.

## 2015-06-22 NOTE — ED Provider Notes (Addendum)
CSN: [id number]     Arrival date & time 06/22/15  1121 History   First MD Initiated Contact with Patient 06/22/15 1123     Chief Complaint  Patient presents with  . Urinary Frequency     (Consider location/radiation/quality/duration/timing/severity/associated sxs/prior Treatment) HPI Comments: 79 year old female with dementia, constipation, pneumonia, encephalopathy, pulmonary embolism presents with fever and high blood pressure. Patient was diagnosed with ear infection started on oral antibody. Patient has not improved. Nursing noticed some purulent drainage from the catheter area. Patient lives at home with assistance for dementia.  Patient is a 79 y.o. female presenting with frequency. The history is provided by the patient.  Urinary Frequency    Past Medical History  Diagnosis Date  . Unspecified constipation   . Anxiety state, unspecified   . Colitis, enteritis, and gastroenteritis of presumed infectious origin 11/02/2011  . Pneumonia, organism unspecified 11/02/2011  . Cystitis, unspecified   . Impetigo   . Renal artery stenosis   . Dementia in conditions classified elsewhere with behavioral disturbance   . Hypercalcemia   . Reflux esophagitis   . Chest pain, unspecified   . Abnormality of gait   . Hyperosmolality and/or hypernatremia   . Unspecified disorder of liver   . Candidiasis of vulva and vagina   . Alzheimer's disease   . Chest pain, unspecified 08/19/2008  . Dementia in conditions classified elsewhere without behavioral disturbance   . Depressive disorder, not elsewhere classified   . Esophageal reflux   . Other and unspecified hyperlipidemia   . Loss of weight   . Unspecified persistent mental disorders due to conditions classified elsewhere   . Reactive confusion   . Unspecified essential hypertension   . Other seborrheic keratosis   . Osteoarthrosis, unspecified whether generalized or localized, unspecified site   . Stiffness of joint, not elsewhere  classified, unspecified site   . Memory loss 07/24/2007  . Headache(784.0)   . Undiagnosed cardiac murmurs   . Seizures    Past Surgical History  Procedure Laterality Date  . Cesarean section     No family history on file. Social History  Substance Use Topics  . Smoking status: Never Smoker   . Smokeless tobacco: Never Used  . Alcohol Use: No   OB History    No data available     Review of Systems  Unable to perform ROS: Dementia  Genitourinary: Positive for frequency.      Allergies  Risperdal  Home Medications   Prior to Admission medications   Medication Sig Start Date End Date Taking? Authorizing Provider  alendronate (FOSAMAX) 70 MG tablet TAKE 1 TABLET ONCE A WEEK ON TUESDAYS. TAKE WITH A FULL GLASS OF WATER ON AN EMPTY STOMACH. 11/09/14  Yes [name] L Reed, DO  ALPRAZolam Prudy Feeler) 0.5 MG tablet Take one tablet by mouth three times daily and one tablets at bedtime as needed for anxiety Patient taking differently: Take 0.25 mg by mouth 3 (three) times daily as needed for anxiety. Take one tablet by mouth three times daily and one tablets at bedtime as needed for anxiety 04/05/15  Yes [name] L Reed, DO  ciprofloxacin (CIPRO) 250 MG tablet Take 1 tablet (250 mg total) by mouth 2 (two) times daily. Patient taking differently: Take 250 mg by mouth 2 (two) times daily. For 10 days 8-24 to 9-2 06/16/15  Yes Kirt Boys, DO  diclofenac sodium (VOLTAREN) 1 % GEL Apply 1 application topically 2 (two) times daily. Apply to knees and hips  Yes Historical Provider, MD  hydrALAZINE (APRESOLINE) 50 MG tablet Take 50 mg by mouth 3 (three) times daily.   Yes Historical Provider, MD  lactose free nutrition (BOOST) LIQD Take 237 mLs by mouth 2 (two) times daily between meals.   Yes Historical Provider, MD  levETIRAcetam (KEPPRA) 500 MG tablet Take 1 tablet (500 mg total) by mouth 2 (two) times daily. 02/17/15  Yes Albertine Grates, MD  losartan (COZAAR) 25 MG tablet TAKE 1 TABLET DAILY FOR BLOOD  PRESSURE 04/20/15  Yes [name] L Reed, DO  memantine (NAMENDA) 10 MG tablet TAKE 1 TABLET TWICE A DAY FOR MEMORY LOSS 05/06/15  Yes [name] L Reed, DO  metoprolol tartrate (LOPRESSOR) 25 MG tablet Take one tablet by mouth twice daily to control pulse Patient taking differently: Take 12.5 mg by mouth 2 (two) times daily.  04/14/15  Yes [name] L Reed, DO  nitroGLYCERIN (NITROSTAT) 0.4 MG SL tablet Place 0.4 mg under the tongue every 5 (five) minutes as needed for chest pain. Take one tablet sublingual every 5 minutes not to exceed three tablets for chest pain   Yes Historical Provider, MD  omeprazole (PRILOSEC) 20 MG capsule TAKE ONE CAPSULE BY MOUTH EVERY DAY FOR REFLUX 06/14/15  Yes [name] L Reed, DO  oxyCODONE (OXY IR/ROXICODONE) 5 MG immediate release tablet Take 2.5 mg by mouth 3 (three) times daily as needed for severe pain.    Yes Historical Provider, MD  rosuvastatin (CRESTOR) 10 MG tablet Take 10 mg by mouth daily.   Yes Historical Provider, MD  saccharomyces boulardii (FLORASTOR) 250 MG capsule Take 1 capsule (250 mg total) by mouth 2 (two) times daily. 06/16/15  Yes Kirt Boys, DO  warfarin (COUMADIN) 2 MG tablet Take 2 mg by mouth every other day. With 5mg  tablet   Yes Historical Provider, MD  warfarin (COUMADIN) 5 MG tablet TAKE 1 TABLET EVERY DAY WITH 2MG  TO EQUAL 7MG  DAILY Patient taking differently: TAKE 1 TABLET EVERY DAY 06/18/15  Yes [name] L Reed, DO  feeding supplement, ENSURE ENLIVE, (ENSURE ENLIVE) LIQD Take 237 mLs by mouth 3 (three) times daily between meals. Patient not taking: Reported on 06/22/2015 02/17/15   Albertine Grates, MD   Pulse 116  Temp(Src) 98.6 F (37 C) (Oral)  SpO2 100% Physical Exam  Constitutional: She appears well-developed. Distressed: mild cachectic.  HENT:  Head: Normocephalic and atraumatic.  Eyes: Right eye exhibits no discharge. Left eye exhibits no discharge.  Neck: Neck supple. No tracheal deviation present.  Cardiovascular: Regular rhythm.    Pulmonary/Chest: Effort normal and breath sounds normal.  Abdominal: Soft. She exhibits no distension. There is no tenderness. There is no guarding.  Musculoskeletal: She exhibits no edema.  Neurological: She is alert. GCS eye subscore is 3. GCS verbal subscore is 3. GCS motor subscore is 5.  Difficult exam due to severe dementia and altered mental status. Flexion at the hips. Patient has grossly equal strength bilateral. Patient alert to verbal.  Skin: Skin is warm. No rash noted.  Patient has approximately 3 cm diameter deep sacral ulcer no surrounding erythema no significant drainage.  Psychiatric:  Dementia severe/altered mental  Nursing note and vitals reviewed.   ED Course  Procedures (including critical care time) Labs Review Labs Reviewed  URINALYSIS, ROUTINE W REFLEX MICROSCOPIC (NOT AT [facility]) - Abnormal; Notable for the following:    APPearance CLOUDY (*)    Hgb urine dipstick LARGE (*)    Protein, ur 30 (*)    Leukocytes, UA MODERATE (*)  All other components within normal limits  CBC WITH DIFFERENTIAL/PLATELET - Abnormal; Notable for the following:    RBC 3.80 (*)    Hemoglobin 10.9 (*)    RDW 16.8 (*)    All other components within normal limits  COMPREHENSIVE METABOLIC PANEL - Abnormal; Notable for the following:    Sodium 146 (*)    Glucose, Bld 168 (*)    BUN 31 (*)    Total Protein 8.2 (*)    Albumin 3.3 (*)    All other components within normal limits  LACTIC ACID, PLASMA - Abnormal; Notable for the following:    Lactic Acid, Venous 2.4 (*)    All other components within normal limits  URINE MICROSCOPIC-ADD ON - Abnormal; Notable for the following:    Bacteria, UA FEW (*)    All other components within normal limits  URINE CULTURE    Imaging Review No results found. I have personally reviewed and evaluated these images and lab results as part of my medical decision-making.   EKG Interpretation None      MDM   Final diagnoses:  Acute  encephalopathy  UTI (lower urinary tract infection)  Sacral ulcer, stage IV   Patient presents with altered mental status and recent urine infection. Clinically dehydrated, IV fluid bolus given. Concern for urine infection versus sacral ulcer as source of fever today, vomiting. Family arrived further details. IV antibody disorder. Paged hospitalist for admission.  The patients results and plan were reviewed and discussed.   Any x-rays performed were independently reviewed by myself.   Differential diagnosis were considered with the presenting HPI.  Medications  0.9 %  sodium chloride infusion (not administered)  sodium chloride 0.9 % bolus 500 mL (0 mLs Intravenous Stopped 06/22/15 1357)  cefTRIAXone (ROCEPHIN) 1 g in dextrose 5 % 50 mL IVPB (0 g Intravenous Stopped 06/22/15 1307)  ondansetron (ZOFRAN) injection 4 mg (4 mg Intravenous Given 06/22/15 1221)    Filed Vitals:   06/22/15 1129  Pulse: 116  Temp: 98.6 F (37 C)  TempSrc: Oral  SpO2: 100%  BP 120 systolic on admission discussion.  Final diagnoses:  Acute encephalopathy  UTI (lower urinary tract infection)  Sacral ulcer, stage IV    Admission/ observation were discussed with the admitting physician, patient and/or family and they are comfortable with the plan.      Blane Ohara, MD 06/22/15 1418  Blane Ohara, MD 06/22/15 1430

## 2015-06-22 NOTE — ED Notes (Signed)
Bed: ZO10 Expected date:  Expected time:  Means of arrival:  Comments: EMS- elderly, UTI x 5 days, Increased fever

## 2015-06-22 NOTE — Telephone Encounter (Signed)
Per Jessica--Continue Current dose of Coumadin and recheck in 1 week. Left message with Herbert Seta with [name] and with son, Arlys John.  Asked to confirm she received message.

## 2015-06-23 DIAGNOSIS — T8351XA Infection and inflammatory reaction due to indwelling urinary catheter, initial encounter: Secondary | ICD-10-CM | POA: Diagnosis not present

## 2015-06-23 LAB — COMPREHENSIVE METABOLIC PANEL
ALBUMIN: 2.7 g/dL — AB (ref 3.5–5.0)
ALT: 18 U/L (ref 14–54)
AST: 17 U/L (ref 15–41)
Alkaline Phosphatase: 57 U/L (ref 38–126)
Anion gap: 6 (ref 5–15)
BUN: 19 mg/dL (ref 6–20)
CHLORIDE: 115 mmol/L — AB (ref 101–111)
CO2: 26 mmol/L (ref 22–32)
CREATININE: 0.47 mg/dL (ref 0.44–1.00)
Calcium: 9 mg/dL (ref 8.9–10.3)
GFR calc non Af Amer: >60 mL/min (ref >60)
GLUCOSE: 96 mg/dL (ref 65–99)
Potassium: 3.4 mmol/L — ABNORMAL LOW (ref 3.5–5.1)
SODIUM: 147 mmol/L — AB (ref 135–145)
Total Bilirubin: 0.4 mg/dL (ref 0.3–1.2)
Total Protein: 6.8 g/dL (ref 6.5–8.1)

## 2015-06-23 LAB — CBC
HCT: 28.5 % — ABNORMAL LOW (ref 36.0–46.0)
Hemoglobin: 8.7 g/dL — ABNORMAL LOW (ref 12.0–15.0)
MCH: 29 pg (ref 26.0–34.0)
MCHC: 30.5 g/dL (ref 30.0–36.0)
MCV: 95 fL (ref 78.0–100.0)
PLATELETS: 286 10*3/uL (ref 150–400)
RBC: 3 MIL/uL — AB (ref 3.87–5.11)
RDW: 16.7 % — ABNORMAL HIGH (ref 11.5–15.5)
WBC: 15.2 10*3/uL — ABNORMAL HIGH (ref 4.0–10.5)

## 2015-06-23 LAB — PROTIME-INR
INR: 2.8 — ABNORMAL HIGH (ref 0.00–1.49)
Prothrombin Time: 29.1 seconds — ABNORMAL HIGH (ref 11.6–15.2)

## 2015-06-23 LAB — URINE CULTURE: Culture: NO GROWTH

## 2015-06-23 MED ORDER — COLLAGENASE 250 UNIT/GM EX OINT
TOPICAL_OINTMENT | Freq: Every day | CUTANEOUS | Status: DC
  Administered 2015-06-23: 17:00:00 via TOPICAL
  Administered 2015-06-24: 1 via TOPICAL
  Administered 2015-06-25: 09:00:00 via TOPICAL
  Filled 2015-06-23: qty 30

## 2015-06-23 MED ORDER — ENSURE ENLIVE PO LIQD
237.0000 mL | Freq: Two times a day (BID) | ORAL | Status: DC
  Administered 2015-06-23 – 2015-06-25 (×4): 237 mL via ORAL

## 2015-06-23 MED ORDER — WARFARIN SODIUM 6 MG PO TABS
7.0000 mg | ORAL_TABLET | Freq: Once | ORAL | Status: AC
  Administered 2015-06-23: 7 mg via ORAL
  Filled 2015-06-23: qty 1

## 2015-06-23 MED ORDER — METOPROLOL TARTRATE 25 MG PO TABS
12.5000 mg | ORAL_TABLET | Freq: Two times a day (BID) | ORAL | Status: DC
  Administered 2015-06-23 – 2015-06-25 (×5): 12.5 mg via ORAL
  Filled 2015-06-23 (×5): qty 1

## 2015-06-23 NOTE — Plan of Care (Signed)
Problem: Phase I Progression Outcomes Goal: OOB as tolerated unless otherwise ordered Outcome: Not Applicable Date Met:  76/18/48 BLE contracted

## 2015-06-23 NOTE — Telephone Encounter (Signed)
LMOM for [name] to return call to make sure she received her message yesterday regarding Coumadin Results.

## 2015-06-23 NOTE — Plan of Care (Signed)
Problem: Phase I Progression Outcomes Goal: Hemodynamically stable Chronic indwelling catheter

## 2015-06-23 NOTE — Progress Notes (Signed)
[facility]                                                                              Patient Demographics  Sabrina Long, is a 79 y.o. female, DOB - 03-22-1936, ZOX:[id number]  Admit date - 06/22/2015   Admitting Physician Costin Otelia Sergeant, MD  Outpatient Primary MD for the patient is REED, [name], DO  LOS - 1   Chief Complaint  Patient presents with  . Urinary Frequency       Brief HPI   Sabrina Long is a 79 y.o. female has a past medical history significant for end-stage dementia, bedbound with a chronic Foley catheter, sacral and left hip decubitus ulcers which are cared for as an outpatient, she is status post a 4 week hospitalization at [facility], hypertension, hyperlipidemia, presents to the emergency room with a chief complaint fever. She was brought in by her son as the patient is living with them. In the ED, patient was nonverbal, her eyes closed and not following any commands. Patient's son reported that she was having fever, elevated blood pressure. At the baseline she opens her eyes, eats very well and is mostly nonverbal. He denied any cough or chest congestion, head nausea and vomiting on the day of admission. In the ED, patient is afebrile, no leukocytosis and a UA grossly positive for infection. [facility] was asked for admission for UTI.    Assessment & Plan    Principal Problem:   Urinary tract infection UTI-  In the setting of bedbound state and chronic Foley catheter - Follow urine cultures and sensitivities, continue IV Rocephin  Sacral decubitus and left hip ulcers - wound care consulted, these appear to be well cared for as an outpatient - If febrile and worsening leukocytosis, will reimage the wounds to rule out any osteomyelitis   History of PE/DVT - continue coumadin   End-stage dementia with failure to thrive, dehydration, lactic acidosis - nutrition consult, SLP evaluation - seen by palliative in the past, stated goal  at that time was comfort only, we consulted palliative medicine - Continue dysphagia 1 diet   HTN - BP now stable, in ED at the time of admission, BP was hypotensive in 90s - Continue gentle hydration, restart Lopressor   Hypernatremia - Change IV fluids to D5W  Hypokalemia - Replaced  HLD - continue statin  Seizures On Keppra   Code Status: DO NOT RESUSCITATE  Family Communication: No family member at the bedside  Disposition Plan: Likely will need skilled nursing facility  Time Spent in minutes 25 minutes  Procedures  None  Consults   Wound Care  DVT Prophylaxis  Coumadin  Medications  Scheduled Meds: . cefTRIAXone (ROCEPHIN)  IV  1 g Intravenous Q24H  . diclofenac sodium  2 g Topical BID  . feeding supplement (ENSURE ENLIVE)  237 mL Oral BID BM  . Influenza vac split quadrivalent PF  0.5 mL Intramuscular Tomorrow-1000  . lactose free nutrition  237 mL Oral BID BM  . levETIRAcetam  500 mg Oral BID  . memantine  10 mg Oral BID  . pantoprazole  40 mg Oral  Daily  . rosuvastatin  10 mg Oral q1800  . saccharomyces boulardii  250 mg Oral BID  . sodium chloride  3 mL Intravenous Q12H  . warfarin  7 mg Oral ONCE-1800  . Warfarin - Pharmacist Dosing Inpatient   Does not apply q1800   Continuous Infusions: . sodium chloride 75 mL/hr at 06/23/15 0609   PRN Meds:.oxyCODONE   Antibiotics   Anti-infectives    Start     Dose/Rate Route Frequency Ordered Stop   06/23/15 1200  cefTRIAXone (ROCEPHIN) 1 g in dextrose 5 % 50 mL IVPB     1 g 100 mL/hr over 30 Minutes Intravenous Every 24 hours 06/22/15 1502     06/22/15 1145  cefTRIAXone (ROCEPHIN) 1 g in dextrose 5 % 50 mL IVPB     1 g 100 mL/hr over 30 Minutes Intravenous  Once 06/22/15 1137 06/22/15 1307        Subjective:   Sabrina Long was seen and examined today.  Nonverbal, tracking otherwise unable to obtain review of systems. Afebrile, no acute issues overnight. Appears to be comfortable     Objective:   Blood pressure 130/71, pulse 95, temperature 98.2 F (36.8 C), temperature source Oral, resp. rate 20, height 5\' 1"  (1.549 m), weight 47.5 kg (104 lb 11.5 oz), SpO2 99 %.  Wt Readings from Last 3 Encounters:  06/22/15 47.5 kg (104 lb 11.5 oz)  02/22/15 49.896 kg (110 lb)  02/15/15 50 kg (110 lb 3.7 oz)     Intake/Output Summary (Last 24 hours) at 06/23/15 1214 Last data filed at 06/23/15 0900  Gross per 24 hour  Intake   1460 ml  Output   1700 ml  Net   -240 ml    Exam  General: Alertawake, tracking, eyes open, dementia, cachetic   HEENT:  PERRLA, EOMI, Anicteric Sclera, mucous membranes moist.   Neck: Supple, no JVD, no masses  CVS: S1 S2 auscultated, no rubs, murmurs or gallops. Regular rate and rhythm.  Respiratory: CTAB  Abdomen: Soft, nontender, nondistended, + bowel sounds  Ext: no cyanosis clubbing or edema  Neuro: does not follow commands  Skin:3-4 cm sacral decub  Psych:does not follow commands  Data Review   Micro Results Recent Results (from the past 240 hour(s))  Urine culture     Status: None   Collection Time: 06/22/15 12:41 PM  Result Value Ref Range Status   Specimen Description URINE, CATHETERIZED  Final   Special Requests NONE  Final   Culture   Final    NO GROWTH 1 DAY Performed at [facility]    Report Status 06/23/2015 FINAL  Final    Radiology Reports No results found.  CBC  Recent Labs Lab 06/22/15 1320 06/23/15 0526  WBC 6.7 15.2*  HGB 10.9* 8.7*  HCT 36.2 28.5*  PLT 341 286  MCV 95.3 95.0  MCH 28.7 29.0  MCHC 30.1 30.5  RDW 16.8* 16.7*  LYMPHSABS 1.9  --   MONOABS 0.5  --   EOSABS 0.0  --   BASOSABS 0.0  --     Chemistries   Recent Labs Lab 06/22/15 1320 06/23/15 0526  NA 146* 147*  K 3.5 3.4*  CL 111 115*  CO2 26 26  GLUCOSE 168* 96  BUN 31* 19  CREATININE 0.62 0.47  CALCIUM 9.6 9.0  AST 24 17  ALT 27 18  ALKPHOS 76 57  BILITOT 0.4 0.4    ------------------------------------------------------------------------------------------------------------------ estimated creatinine clearance is 44.2 mL/min (by C-G  formula based on Cr of 0.47). ------------------------------------------------------------------------------------------------------------------ No results for input(s): HGBA1C in the last 72 hours. ------------------------------------------------------------------------------------------------------------------ No results for input(s): CHOL, HDL, LDLCALC, TRIG, CHOLHDL, LDLDIRECT in the last 72 hours. ------------------------------------------------------------------------------------------------------------------ No results for input(s): TSH, T4TOTAL, T3FREE, THYROIDAB in the last 72 hours.  Invalid input(s): FREET3 ------------------------------------------------------------------------------------------------------------------ No results for input(s): VITAMINB12, FOLATE, FERRITIN, TIBC, IRON, RETICCTPCT in the last 72 hours.  Coagulation profile  Recent Labs Lab 06/22/15 1555 06/23/15 0526  INR 2.25* 2.80*    No results for input(s): DDIMER in the last 72 hours.  Cardiac Enzymes No results for input(s): CKMB, TROPONINI, MYOGLOBIN in the last 168 hours.  Invalid input(s): CK ------------------------------------------------------------------------------------------------------------------ Invalid input(s): POCBNP  No results for input(s): GLUCAP in the last 72 hours.   RAI,RIPUDEEP M.D. [facility] 06/23/2015, 12:14 PM  Pager: [phone] Between 7am to 7pm - call Pager - [phone]  After 7pm go to [email or web address] - password [email or web address]  Call night coverage person covering after 7pm

## 2015-06-23 NOTE — Evaluation (Signed)
Clinical/Bedside Swallow Evaluation Patient Details  Name: Sabrina Long MRN: [id number] Date of Birth: 1936-06-04  Today's Date: 06/23/2015 Time: SLP Start Time (ACUTE ONLY): 1220 SLP Stop Time (ACUTE ONLY): 1250 SLP Time Calculation (min) (ACUTE ONLY): 30 min  Past Medical History:  Past Medical History  Diagnosis Date  . Unspecified constipation   . Anxiety state, unspecified   . Colitis, enteritis, and gastroenteritis of presumed infectious origin 11/02/2011  . Pneumonia, organism unspecified 11/02/2011  . Cystitis, unspecified   . Impetigo   . Renal artery stenosis   . Dementia in conditions classified elsewhere with behavioral disturbance   . Hypercalcemia   . Reflux esophagitis   . Chest pain, unspecified   . Abnormality of gait   . Hyperosmolality and/or hypernatremia   . Unspecified disorder of liver   . Candidiasis of vulva and vagina   . Alzheimer's disease   . Chest pain, unspecified 08/19/2008  . Dementia in conditions classified elsewhere without behavioral disturbance   . Depressive disorder, not elsewhere classified   . Esophageal reflux   . Other and unspecified hyperlipidemia   . Loss of weight   . Unspecified persistent mental disorders due to conditions classified elsewhere   . Reactive confusion   . Unspecified essential hypertension   . Other seborrheic keratosis   . Osteoarthrosis, unspecified whether generalized or localized, unspecified site   . Stiffness of joint, not elsewhere classified, unspecified site   . Memory loss 07/24/2007  . Headache(784.0)   . Undiagnosed cardiac murmurs   . Seizures    Past Surgical History:  Past Surgical History  Procedure Laterality Date  . Cesarean section     HPI:  79 yo female with h/o dementia, seizures - resides with son, admitted with AMS.  Pt has chronic foley - diagnosed with UTI.  Per chart review, pt is wheelchair or bedbound.  Swallow evaluation ordered.    Assessment / Plan /  Recommendation Clinical Impression  Pt presents with moderate oral dysphagia with suspected pharyngeal strength intact.  Slow oral transiting noted due to weakness however no oral residuals apparent after completion of meal.  No s/s of aspiration with all po consumed and pt opens mouth to cue feeder when she is ready for more intake.  Son Arlys John arrived near end of evaluation and reports his mother resides with him.  He chops pt's food for her and reports she eats well at baseline.  Advised that at this time with current illness and level of oral deficits, would recommend to continue puree/thin diet - he agreed.  Demonstrated to son to observe laryngeal elevation to assure pt swallows before giving more due to delay.  Also advised pt noted to have multiple swallows with liquids, likely piecemealing.  Son reports understanding.  No further SLP warranted as all education completed with family and pt tolerating diet.  Thanks for this consult.      Aspiration Risk       Diet Recommendation Dysphagia 1 (Puree);Thin   Medication Administration: Crushed with puree Compensations: Slow rate;Small sips/bites (give pt time for swallow - delay, multiple swallows with liquids reflexively noted)    Other  Recommendations Oral Care Recommendations: Oral care BID;Staff/trained caregiver to provide oral care (son reports he brushes his mother's teeth daily)   Follow Up Recommendations       Frequency and Duration        Pertinent Vitals/Pain Afebrile, decreased      Swallow Study Prior Functional Status   see  hhx    General Date of Onset: 06/23/15 Other Pertinent Information: 79 yo female with h/o dementia, seizures - resides with son, admitted with AMS.  Pt has chronic foley - diagnosed with UTI.  Per chart review, pt is wheelchair or bedbound.  Swallow evaluation ordered.  Type of Study: Bedside swallow evaluation Diet Prior to this Study: Dysphagia 1 (puree);Thin liquids Temperature Spikes Noted:  No Respiratory Status: Room air History of Recent Intubation: No Behavior/Cognition: Alert;Cooperative;Doesn't follow directions Oral Cavity - Dentition: Adequate natural dentition/normal for age Self-Feeding Abilities: Total assist Patient Positioning: Upright in bed Baseline Vocal Quality: Low vocal intensity Volitional Cough: Cognitively unable to elicit Volitional Swallow: Unable to elicit    Oral/Motor/Sensory Function Overall Oral Motor/Sensory Function: Other (comment) (generalized weakness, no focal cn deficits)   Ice Chips Ice chips: Not tested   Thin Liquid Thin Liquid: Impaired Presentation: Straw Oral Phase Impairments: Reduced lingual movement/coordination;Impaired anterior to posterior transit Oral Phase Functional Implications: Prolonged oral transit Pharyngeal  Phase Impairments: Suspected delayed Swallow;Multiple swallows    Nectar Thick Nectar Thick Liquid: Impaired Presentation: Straw Oral Phase Impairments: Impaired anterior to posterior transit;Reduced lingual movement/coordination Oral phase functional implications: Prolonged oral transit Pharyngeal Phase Impairments: Multiple swallows;Suspected delayed Swallow   Honey Thick Honey Thick Liquid: Not tested   Puree Puree: Impaired Presentation: Spoon Oral Phase Impairments: Reduced lingual movement/coordination;Impaired anterior to posterior transit Oral Phase Functional Implications: Prolonged oral transit Pharyngeal Phase Impairments: Suspected delayed Swallow   Solid   GO    Solid: Not tested Other Comments: did not test secondary to level of oral deficit and pt willingness to consume pureed foods      Donavan Burnet, MS [facility] SLP [phone]

## 2015-06-23 NOTE — Progress Notes (Signed)
Initial Nutrition Assessment  DOCUMENTATION CODES:   Not applicable  INTERVENTION:  - Will order Ensure Enlive BID, each supplement provides 350 kcal and 20 grams of protein - Encourage PO intakes of meals and supplement for wound healing - RD will continue to monitor for needs  NUTRITION DIAGNOSIS:   Increased nutrient needs related to wound healing as evidenced by estimated needs.  GOAL:   Patient will meet greater than or equal to 90% of their needs  MONITOR:   PO intake, Supplement acceptance, Weight trends, Labs, I & O's, Skin  REASON FOR ASSESSMENT:   Consult Assessment of nutrition requirement/status  ASSESSMENT:   79 y.o. female has a past medical history significant for end-stage dementia, bedbound with a chronic Foley catheter, sacral and left hip decubitus ulcers which are cared for as an outpatient, she is status post a 4 week hospitalization at [facility], hypertension, hyperlipidemia, presents to the emergency room with a chief complaint fever. She was brought in by her son as the patient is living with them. In the ED, patient is nonverbal, her eyes are closed and she is not following any commands so the story is per her son. He states that she has been having fever and elevated blood pressure. He tells me that at baseline, she opens her eyes, eats very well and is mostly nonverbal. He denies hearing any cough or chest congestion, he tells me that she has had nausea and vomiting today. In the ED, patient is afebrile, no leukocytosis and a UA grossly positive for infection. [facility] was asked for admission for UTI.   Pt seen for consult. BMI indicates normal weight status. Per chart review, pt consumed 25% of breakfast. Boost Breeze opened on bedside table but upon lifting up carton it feels that no intakes of supplement taken.  Per notes, pt with end-stage dementia and was non-verbal in the ED. Pt sleeping at time of visit and no family present.  Per chart review, pt  has lost 6 lbs (5% body weight) in the past 3 months which is not significant for time frame. Did not feel it was appropriate to perform physical assessment on this pt while she was sleeping; will do so at follow-up.  Will order Ensure Enlive BID due to protein content; will monitor for need to adjust supplements. Not meeting needs. Medications reviewed. Labs reviewed; Na: 147 mmol/L, K: 3.4 mmol/L, Cl: 115 mmol/L.   Diet Order:  DIET - DYS 1 Room service appropriate?: Yes with Assist; Fluid consistency:: Thin  Skin:  Stage 4 pressure ulcers to sacrum and L hip, DTI to R ankle  Last BM:  8/31  Height:   Ht Readings from Last 1 Encounters:  06/22/15  (1.549 m)    Weight:   Wt Readings from Last 1 Encounters:  06/22/15 104 lb 11.5 oz (47.5 kg)    Ideal Body Weight:  47.73 kg (kg)  BMI:  Body mass index is 19.8 kg/(m^2).  Estimated Nutritional Needs:   Kcal:  4098-1191  Protein:  85-95 grams  Fluid:  >/= 2.5 L/day  EDUCATION NEEDS:   No education needs identified at this time     Trenton Gammon, RD, LDN Inpatient Clinical Dietitian Pager # [phone] After hours/weekend pager # [phone]

## 2015-06-23 NOTE — Clinical Documentation Improvement (Signed)
Hospitalist  The diagnosis of "Acute Encephalopathy" is documented by the ED provider.  If you agree with the diagnosis of Acute Encephalopathy, please clarify the Type, and document in the progress notes and discharge summary.  Please exercise your independent, professional judgment when responding. A specific answer is not anticipated or expected.   Thank You, Jerral Ralph  RN BSN CCDS [phone] [facility] [place]

## 2015-06-23 NOTE — Care Management Note (Signed)
Case Management Note  Patient Details  Name: Sabrina Long MRN: [id number] Date of Birth: 11-Jun-1936  Subjective/Objective:79 y/o f admitted w/UTI. From home. Will confirm if active w/HHC.Would recommend PT cons.                     Action/Plan:Monitor progress for d/c needs.   Expected Discharge Date:   (unknown)               Expected Discharge Plan:  Home w Home Health Services  In-House Referral:     Discharge planning Services  CM Consult  Post Acute Care Choice:    Choice offered to:     DME Arranged:    DME Agency:     HH Arranged:    HH Agency:     Status of Service:  In process, will continue to follow  Medicare Important Message Given:    Date Medicare IM Given:    Medicare IM give by:    Date Additional Medicare IM Given:    Additional Medicare Important Message give by:     If discussed at Long Length of Stay Meetings, dates discussed:    Additional Comments:  Lanier Clam, RN 06/23/2015, 11:23 AM

## 2015-06-23 NOTE — Progress Notes (Signed)
ANTICOAGULATION CONSULT NOTE - follow up  Pharmacy Consult for warfarin Indication: History of PE  Allergies  Allergen Reactions  . Risperdal [Risperidone] Other (See Comments)    sedation    Patient Measurements: Height: 5\' 1"  (154.9 cm) Weight: 104 lb 11.5 oz (47.5 kg) IBW/kg (Calculated) : 47.8  Vital Signs: Temp: 98.2 F (36.8 C) (08/31 0459) Temp Source: Oral (08/31 0459) BP: 130/71 mmHg (08/31 0459) Pulse Rate: 95 (08/31 0459)  Labs:  Recent Labs  06/22/15 1320 06/22/15 1555 06/23/15 0526  HGB 10.9*  --  8.7*  HCT 36.2  --  28.5*  PLT 341  --  286  LABPROT  --  24.7* 29.1*  INR  --  2.25* 2.80*  CREATININE 0.62  --  0.47    Estimated Creatinine Clearance: 44.2 mL/min (by C-G formula based on Cr of 0.47).   Medical History: Past Medical History  Diagnosis Date  . Unspecified constipation   . Anxiety state, unspecified   . Colitis, enteritis, and gastroenteritis of presumed infectious origin 11/02/2011  . Pneumonia, organism unspecified 11/02/2011  . Cystitis, unspecified   . Impetigo   . Renal artery stenosis   . Dementia in conditions classified elsewhere with behavioral disturbance   . Hypercalcemia   . Reflux esophagitis   . Chest pain, unspecified   . Abnormality of gait   . Hyperosmolality and/or hypernatremia   . Unspecified disorder of liver   . Candidiasis of vulva and vagina   . Alzheimer's disease   . Chest pain, unspecified 08/19/2008  . Dementia in conditions classified elsewhere without behavioral disturbance   . Depressive disorder, not elsewhere classified   . Esophageal reflux   . Other and unspecified hyperlipidemia   . Loss of weight   . Unspecified persistent mental disorders due to conditions classified elsewhere   . Reactive confusion   . Unspecified essential hypertension   . Other seborrheic keratosis   . Osteoarthrosis, unspecified whether generalized or localized, unspecified site   . Stiffness of joint, not  elsewhere classified, unspecified site   . Memory loss 07/24/2007  . Headache(784.0)   . Undiagnosed cardiac murmurs   . Seizures     Medications:  Prescriptions prior to admission  Medication Sig Dispense Refill Last Dose  . alendronate (FOSAMAX) 70 MG tablet TAKE 1 TABLET ONCE A WEEK ON TUESDAYS. TAKE WITH A FULL GLASS OF WATER ON AN EMPTY STOMACH. 12 tablet 3 06/22/2015 at Unknown time  . ALPRAZolam (XANAX) 0.5 MG tablet Take one tablet by mouth three times daily and one tablets at bedtime as needed for anxiety (Patient taking differently: Take 0.25 mg by mouth 3 (three) times daily as needed for anxiety. Take one tablet by mouth three times daily and one tablets at bedtime as needed for anxiety) 120 tablet 3 06/22/2015 at Unknown time  . ciprofloxacin (CIPRO) 250 MG tablet Take 1 tablet (250 mg total) by mouth 2 (two) times daily. (Patient taking differently: Take 250 mg by mouth 2 (two) times daily. For 10 days 8-24 to 9-2) 6 tablet 0 06/22/2015 at Unknown time  . diclofenac sodium (VOLTAREN) 1 % GEL Apply 1 application topically 2 (two) times daily. Apply to knees and hips   06/22/2015 at Unknown time  . hydrALAZINE (APRESOLINE) 50 MG tablet Take 50 mg by mouth 3 (three) times daily.   06/22/2015 at Unknown time  . lactose free nutrition (BOOST) LIQD Take 237 mLs by mouth 2 (two) times daily between meals.   06/21/2015 at  Unknown time  . levETIRAcetam (KEPPRA) 500 MG tablet Take 1 tablet (500 mg total) by mouth 2 (two) times daily. 60 tablet 3 06/22/2015 at 0920  . losartan (COZAAR) 25 MG tablet TAKE 1 TABLET DAILY FOR BLOOD PRESSURE 90 tablet 0 06/22/2015 at Unknown time  . memantine (NAMENDA) 10 MG tablet TAKE 1 TABLET TWICE A DAY FOR MEMORY LOSS 180 tablet 1 06/22/2015 at Unknown time  . metoprolol tartrate (LOPRESSOR) 25 MG tablet Take one tablet by mouth twice daily to control pulse (Patient taking differently: Take 12.5 mg by mouth 2 (two) times daily. ) 60 tablet 3 06/22/2015 at 0920  .  nitroGLYCERIN (NITROSTAT) 0.4 MG SL tablet Place 0.4 mg under the tongue every 5 (five) minutes as needed for chest pain. Take one tablet sublingual every 5 minutes not to exceed three tablets for chest pain   PRN  . omeprazole (PRILOSEC) 20 MG capsule TAKE ONE CAPSULE BY MOUTH EVERY DAY FOR REFLUX 30 capsule 2 06/22/2015 at Unknown time  . oxyCODONE (OXY IR/ROXICODONE) 5 MG immediate release tablet Take 2.5 mg by mouth 3 (three) times daily as needed for severe pain.    06/22/2015 at Unknown time  . rosuvastatin (CRESTOR) 10 MG tablet Take 10 mg by mouth daily.   06/22/2015 at Unknown time  . saccharomyces boulardii (FLORASTOR) 250 MG capsule Take 1 capsule (250 mg total) by mouth 2 (two) times daily. 14 capsule 0 06/22/2015 at Unknown time  . warfarin (COUMADIN) 2 MG tablet Take 2 mg by mouth every other day. With  tablet   06/21/2015 at 2030  . warfarin (COUMADIN) 5 MG tablet TAKE 1 TABLET EVERY DAY WITH  TO EQUAL  DAILY (Patient taking differently: TAKE 1 TABLET EVERY DAY) 30 tablet 2 06/21/2015 at 2030  . feeding supplement, ENSURE ENLIVE, (ENSURE ENLIVE) LIQD Take 237 mLs by mouth 3 (three) times daily between meals. (Patient not taking: Reported on 06/22/2015) 237 mL 12 Taking    Assessment: 79yo F w/ end-stage dementia and chronic pressure ulcers from home with reported fever. Suspected UTI. Pharmacy is asked to manage Coumadin.   Home dose is  alternating with . Last took  on 8/29.  INR is in the therapeutic range today (2.8)  CBC is ok.   Diet: dysphagia/thin  Drug interactions: Has been taking Cipro since 8/24 which can increase the INR, last dose 8/30.  Goal of Therapy:  INR 2-3 Monitor platelets by anticoagulation protocol: Yes   Plan:  Give Coumadin  today as per home regimen. Check PT/INR daily.  Arley Phenix RPh 06/23/2015, 8:01 AM Pager [phone]

## 2015-06-23 NOTE — Care Management Note (Signed)
Case Management Note  Patient Details  Name: Sabrina Long MRN: [id number] Date of Birth: [birth year]  Subjective/Objective:confirmed [facility] active w/HHC.Noted on H&P palliative cons in the past,may need to re-consider.                    Action/Plan:   Expected Discharge Date:   (unknown)               Expected Discharge Plan:  Skilled Nursing Facility  In-House Referral:  Clinical Social Work  Discharge planning Services  CM Consult  Post Acute Care Choice:  Home Health (Active w/[facility]) Choice offered to:     DME Arranged:    DME Agency:     HH Arranged:    HH Agency:     Status of Service:  In process, will continue to follow  Medicare Important Message Given:    Date Medicare IM Given:    Medicare IM give by:    Date Additional Medicare IM Given:    Additional Medicare Important Message give by:     If discussed at Long Length of Stay Meetings, dates discussed:    Additional Comments:  Lanier Clam, RN 06/23/2015, 11:44 AM

## 2015-06-23 NOTE — Consult Note (Signed)
WOC wound consult note Reason for Consult:Chronic pressure injury, present on admission.  Wound type:Stage III pressure injury to left hip Stage IV pressure injury to sacrum Unstageable pressure ulcer to right lateral malleolus Pressure Ulcer POA: Yes Measurement:Right malleolus 1 cm x 1 cm x 0.1 cm, wound bed 100% devitalized tissue Sacrum:  5 cm x 3.2 cm x 1.8 cm 50% slough, devitalized tissue. Bone is palpable. Left trochanter:  4 cm x 5.5 cm x 1.5 cm 100% pale pink and nongranulating  Drainage (amount, consistency, odor) Moderate serosanguinous drainage.  Musty odor.  Periwound:Intact Dressing procedure/placement/frequency:Cleanse left trochanter wound with NS and pat gently dry.  Gently fill with NS moist gauze and cover with ABD pad. Change twice daily.   Cleanse right malleolus and sacral wound with NS and pat gently dry.  Cover wound bed with Santyl, 1/8 inch thickness (opaque) Cover with NS moist gauze.  Secure with 4x4 gauze and cover with ABD pad/tape.  Change daily.  Will not follow at this time.  Please re-consult if needed.  Maple Hudson RN BSN CWON Pager [phone]

## 2015-06-24 DIAGNOSIS — N3000 Acute cystitis without hematuria: Secondary | ICD-10-CM

## 2015-06-24 DIAGNOSIS — L89154 Pressure ulcer of sacral region, stage 4: Secondary | ICD-10-CM

## 2015-06-24 DIAGNOSIS — R627 Adult failure to thrive: Secondary | ICD-10-CM

## 2015-06-24 DIAGNOSIS — G309 Alzheimer's disease, unspecified: Secondary | ICD-10-CM

## 2015-06-24 DIAGNOSIS — F0391 Unspecified dementia with behavioral disturbance: Secondary | ICD-10-CM

## 2015-06-24 DIAGNOSIS — Z515 Encounter for palliative care: Secondary | ICD-10-CM

## 2015-06-24 DIAGNOSIS — Z86711 Personal history of pulmonary embolism: Secondary | ICD-10-CM

## 2015-06-24 LAB — PROTIME-INR
INR: 2.92 — AB (ref 0.00–1.49)
Prothrombin Time: 30 seconds — ABNORMAL HIGH (ref 11.6–15.2)

## 2015-06-24 LAB — BASIC METABOLIC PANEL
Anion gap: 4 — ABNORMAL LOW (ref 5–15)
BUN: 16 mg/dL (ref 6–20)
CO2: 27 mmol/L (ref 22–32)
CREATININE: 0.43 mg/dL — AB (ref 0.44–1.00)
Calcium: 9 mg/dL (ref 8.9–10.3)
Chloride: 118 mmol/L — ABNORMAL HIGH (ref 101–111)
Glucose, Bld: 102 mg/dL — ABNORMAL HIGH (ref 65–99)
POTASSIUM: 3.7 mmol/L (ref 3.5–5.1)
SODIUM: 149 mmol/L — AB (ref 135–145)

## 2015-06-24 LAB — CBC
HEMATOCRIT: 27 % — AB (ref 36.0–46.0)
Hemoglobin: 8.3 g/dL — ABNORMAL LOW (ref 12.0–15.0)
MCH: 29.5 pg (ref 26.0–34.0)
MCHC: 30.7 g/dL (ref 30.0–36.0)
MCV: 96.1 fL (ref 78.0–100.0)
PLATELETS: 273 10*3/uL (ref 150–400)
RBC: 2.81 MIL/uL — AB (ref 3.87–5.11)
RDW: 17.2 % — AB (ref 11.5–15.5)
WBC: 9.7 10*3/uL (ref 4.0–10.5)

## 2015-06-24 MED ORDER — LEVETIRACETAM 100 MG/ML PO SOLN
500.0000 mg | Freq: Two times a day (BID) | ORAL | Status: DC
  Administered 2015-06-24 – 2015-06-25 (×3): 500 mg via ORAL
  Filled 2015-06-24 (×3): qty 5

## 2015-06-24 MED ORDER — DEXTROSE 5 % IV SOLN
INTRAVENOUS | Status: DC
  Administered 2015-06-24: 1000 mL via INTRAVENOUS
  Administered 2015-06-25: 04:00:00 via INTRAVENOUS

## 2015-06-24 MED ORDER — WARFARIN SODIUM 5 MG PO TABS
5.0000 mg | ORAL_TABLET | Freq: Once | ORAL | Status: AC
  Administered 2015-06-24: 5 mg via ORAL
  Filled 2015-06-24: qty 1

## 2015-06-24 NOTE — Progress Notes (Signed)
ANTICOAGULATION CONSULT NOTE - follow up  Pharmacy Consult for warfarin Indication: History of PE  Allergies  Allergen Reactions  . Risperdal [Risperidone] Other (See Comments)    sedation    Patient Measurements: Height: 5\' 1"  (154.9 cm) Weight: 104 lb 11.5 oz (47.5 kg) IBW/kg (Calculated) : 47.8  Vital Signs: Temp: 98 F (36.7 C) (09/01 0632) Temp Source: Axillary (09/01 4098) BP: 140/70 mmHg (09/01 0632) Pulse Rate: 81 (09/01 0632)  Labs:  Recent Labs  06/22/15 1320 06/22/15 1555 06/23/15 0526 06/24/15 0600  HGB 10.9*  --  8.7* 8.3*  HCT 36.2  --  28.5* 27.0*  PLT 341  --  286 273  LABPROT  --  24.7* 29.1* 30.0*  INR  --  2.25* 2.80* 2.92*  CREATININE 0.62  --  0.47 0.43*    Estimated Creatinine Clearance: 44.2 mL/min (by C-G formula based on Cr of 0.43).   Medical History: Past Medical History  Diagnosis Date  . Unspecified constipation   . Anxiety state, unspecified   . Colitis, enteritis, and gastroenteritis of presumed infectious origin 11/02/2011  . Pneumonia, organism unspecified 11/02/2011  . Cystitis, unspecified   . Impetigo   . Renal artery stenosis   . Dementia in conditions classified elsewhere with behavioral disturbance   . Hypercalcemia   . Reflux esophagitis   . Chest pain, unspecified   . Abnormality of gait   . Hyperosmolality and/or hypernatremia   . Unspecified disorder of liver   . Candidiasis of vulva and vagina   . Alzheimer's disease   . Chest pain, unspecified 08/19/2008  . Dementia in conditions classified elsewhere without behavioral disturbance   . Depressive disorder, not elsewhere classified   . Esophageal reflux   . Other and unspecified hyperlipidemia   . Loss of weight   . Unspecified persistent mental disorders due to conditions classified elsewhere   . Reactive confusion   . Unspecified essential hypertension   . Other seborrheic keratosis   . Osteoarthrosis, unspecified whether generalized or localized,  unspecified site   . Stiffness of joint, not elsewhere classified, unspecified site   . Memory loss 07/24/2007  . Headache(784.0)   . Undiagnosed cardiac murmurs   . Seizures     Medications:  Prescriptions prior to admission  Medication Sig Dispense Refill Last Dose  . alendronate (FOSAMAX) 70 MG tablet TAKE 1 TABLET ONCE A WEEK ON TUESDAYS. TAKE WITH A FULL GLASS OF WATER ON AN EMPTY STOMACH. 12 tablet 3 06/22/2015 at Unknown time  . ALPRAZolam (XANAX) 0.5 MG tablet Take one tablet by mouth three times daily and one tablets at bedtime as needed for anxiety (Patient taking differently: Take 0.25 mg by mouth 3 (three) times daily as needed for anxiety. Take one tablet by mouth three times daily and one tablets at bedtime as needed for anxiety) 120 tablet 3 06/22/2015 at Unknown time  . ciprofloxacin (CIPRO) 250 MG tablet Take 1 tablet (250 mg total) by mouth 2 (two) times daily. (Patient taking differently: Take 250 mg by mouth 2 (two) times daily. For 10 days 8-24 to 9-2) 6 tablet 0 06/22/2015 at Unknown time  . diclofenac sodium (VOLTAREN) 1 % GEL Apply 1 application topically 2 (two) times daily. Apply to knees and hips   06/22/2015 at Unknown time  . hydrALAZINE (APRESOLINE) 50 MG tablet Take 50 mg by mouth 3 (three) times daily.   06/22/2015 at Unknown time  . lactose free nutrition (BOOST) LIQD Take 237 mLs by mouth 2 (two)  times daily between meals.   06/21/2015 at Unknown time  . levETIRAcetam (KEPPRA) 500 MG tablet Take 1 tablet (500 mg total) by mouth 2 (two) times daily. 60 tablet 3 06/22/2015 at 0920  . losartan (COZAAR) 25 MG tablet TAKE 1 TABLET DAILY FOR BLOOD PRESSURE 90 tablet 0 06/22/2015 at Unknown time  . memantine (NAMENDA) 10 MG tablet TAKE 1 TABLET TWICE A DAY FOR MEMORY LOSS 180 tablet 1 06/22/2015 at Unknown time  . metoprolol tartrate (LOPRESSOR) 25 MG tablet Take one tablet by mouth twice daily to control pulse (Patient taking differently: Take 12.5 mg by mouth 2 (two) times  daily. ) 60 tablet 3 06/22/2015 at 0920  . nitroGLYCERIN (NITROSTAT) 0.4 MG SL tablet Place 0.4 mg under the tongue every 5 (five) minutes as needed for chest pain. Take one tablet sublingual every 5 minutes not to exceed three tablets for chest pain   PRN  . omeprazole (PRILOSEC) 20 MG capsule TAKE ONE CAPSULE BY MOUTH EVERY DAY FOR REFLUX 30 capsule 2 06/22/2015 at Unknown time  . oxyCODONE (OXY IR/ROXICODONE) 5 MG immediate release tablet Take 2.5 mg by mouth 3 (three) times daily as needed for severe pain.    06/22/2015 at Unknown time  . rosuvastatin (CRESTOR) 10 MG tablet Take 10 mg by mouth daily.   06/22/2015 at Unknown time  . saccharomyces boulardii (FLORASTOR) 250 MG capsule Take 1 capsule (250 mg total) by mouth 2 (two) times daily. 14 capsule 0 06/22/2015 at Unknown time  . warfarin (COUMADIN) 2 MG tablet Take 2 mg by mouth every other day. With  tablet   06/21/2015 at 2030  . warfarin (COUMADIN) 5 MG tablet TAKE 1 TABLET EVERY DAY WITH  TO EQUAL  DAILY (Patient taking differently: TAKE 1 TABLET EVERY DAY) 30 tablet 2 06/21/2015 at 2030  . feeding supplement, ENSURE ENLIVE, (ENSURE ENLIVE) LIQD Take 237 mLs by mouth 3 (three) times daily between meals. (Patient not taking: Reported on 06/22/2015) 237 mL 12 Taking    Assessment: 79yo F w/ end-stage dementia and chronic pressure ulcers from home with reported fever. Suspected UTI. Pharmacy is asked to manage Coumadin.   Home dose is  alternating with .  INR is in the therapeutic range today (2.9)  CBC is ok.   Diet: dysphagia/thin  Drug interactions: Has been taking Cipro since 8/24 which can increase the INR, last dose 8/30.  Goal of Therapy:  INR 2-3 Monitor platelets by anticoagulation protocol: Yes   Plan:  Give Coumadin  today as per home regimen. Check PT/INR daily.  Arley Phenix RPh 06/24/2015, 7:26 AM Pager [phone]

## 2015-06-24 NOTE — Care Management Important Message (Signed)
Important Message  Patient Details  Name: Sabrina Long MRN: [id number] Date of Birth: 1936/05/10   Medicare Important Message Given:  Yes-second notification given    Haskell Flirt 06/24/2015, 1:01 PMImportant Message  Patient Details  Name: Sabrina Long MRN: [id number] Date of Birth: Nov 26, 1935   Medicare Important Message Given:  Yes-second notification given    Haskell Flirt 06/24/2015, 1:00 PM

## 2015-06-24 NOTE — Consult Note (Signed)
Consultation Note Date: 06/24/2015   Patient Name: Sabrina Long  DOB: [birth year]  MRN: [id number]  Age / Sex: 79 y.o., female   PCP: Kermit Balo, DO Referring Physician: Micael Hampshire Acost*  Reason for Consultation: Establishing goals of care and Psychosocial/spiritual support  Palliative Care Assessment and Plan Summary of Established Goals of Care and Medical Treatment Preferences    Palliative Care Discussion Held Today:     This NP Lorinda Creed reviewed medical records, received report from team, assessed the patient and then meet at the patient's bedside along with her son Arlys John and his wife  to discuss diagnosis, prognosis, GOC, EOL wishes, disposition and options.  A detailed discussion was had today regarding advanced directives.  Concepts specific to code status, artifical feeding and hydration, continued IV antibiotics and rehospitalization was had.  The difference between an aggressive medical intervention path  and a palliative comfort care path for this patient at this time was had.  Values and goals of care important to patient and family were attempted to be elicited.  Detailed conversation discussing the natural trajectory dementia  and expectations and EOL was had.  We discussed the concept of failure to thrive, and the likelihood that the patient will again decline 2/2 to poor po intake and susceptibility to  infection.   Concept of Hospice and Palliative Care were discussed  Natural trajectory and expectations at EOL were discussed.  Questions and concerns addressed.   Family encouraged to call with questions or concerns.  PMT will continue to support holistically.  MOST from introduced   Primary Decision Maker: No documented HPOA   Goals of Care/Code Status/Advance Care Planning:   Code Status:  DNR/DNI  Artificial hydration: yes  They "wish we could have IVs at home"  Antibiotics: yes   Rehospitalization: Yes   Psycho-social/Spiritual:     Support System: large family   Prognosis: < 6 months--declines hospice services at this time  Discharge Planning:  Home with Home Health       Chief Complaint: altered mental status  History of Present Illness:  Sabrina Long is a 79 y.o. female has a past medical history significant for end-stage dementia, bedbound with a chronic Foley catheter, sacral and left hip decubitus ulcers which are cared for as an outpatient, she is status post a 4 week hospitalization at [facility]/home only for 2 weeks, hypertension, hyperlipidemia, presents to the emergency room with a chief complaint fever. She was brought in by her son as the patient is living with them. In the ED, patient is nonverbal, her eyes are closed and she is not following any commands so the story is per her son. He states that she has been having fever and elevated blood pressure. He tells me that at baseline, she opens her eyes, eats very well and is mostly nonverbal. He denies hearing any cough or chest congestion, he tells me that she has had nausea and vomiting today.  Admitted for treatment of UTI  Continued physical, functional and cognitive decline.  Family faced with advanced directive descions and anticipatory care needs.    Primary Diagnoses  Present on Admission:  . UTI (lower urinary tract infection) . Pressure ulcer . UTI (urinary tract infection) . Hyperlipidemia with target LDL less than 100 . FTT (failure to thrive) in adult . Hypernatremia . Dementia with behavioral disturbance . Alzheimer's disease . History of pulmonary embolus (PE)  Palliative Review of Systems:    -unable to illicit to  to decreased cognition   I have reviewed the medical record, interviewed the patient and family, and examined the patient. The following aspects are pertinent.  Past Medical History  Diagnosis Date  . Unspecified constipation   . Anxiety state, unspecified   . Colitis, enteritis, and gastroenteritis  of presumed infectious origin 11/02/2011  . Pneumonia, organism unspecified 11/02/2011  . Cystitis, unspecified   . Impetigo   . Renal artery stenosis   . Dementia in conditions classified elsewhere with behavioral disturbance   . Hypercalcemia   . Reflux esophagitis   . Chest pain, unspecified   . Abnormality of gait   . Hyperosmolality and/or hypernatremia   . Unspecified disorder of liver   . Candidiasis of vulva and vagina   . Alzheimer's disease   . Chest pain, unspecified 08/19/2008  . Dementia in conditions classified elsewhere without behavioral disturbance   . Depressive disorder, not elsewhere classified   . Esophageal reflux   . Other and unspecified hyperlipidemia   . Loss of weight   . Unspecified persistent mental disorders due to conditions classified elsewhere   . Reactive confusion   . Unspecified essential hypertension   . Other seborrheic keratosis   . Osteoarthrosis, unspecified whether generalized or localized, unspecified site   . Stiffness of joint, not elsewhere classified, unspecified site   . Memory loss 07/24/2007  . Headache(784.0)   . Undiagnosed cardiac murmurs   . Seizures    Social History   Social History  . Marital Status: Married    Spouse Name: N/A  . Number of Children: N/A  . Years of Education: N/A   Social History Main Topics  . Smoking status: Never Smoker   . Smokeless tobacco: Never Used  . Alcohol Use: No  . Drug Use: No  . Sexual Activity: Not Asked   Other Topics Concern  . None   Social History Narrative   No family history on file. Scheduled Meds: . cefTRIAXone (ROCEPHIN)  IV  1 g Intravenous Q24H  . collagenase   Topical Daily  . diclofenac sodium  2 g Topical BID  . feeding supplement (ENSURE ENLIVE)  237 mL Oral BID BM  . lactose free nutrition  237 mL Oral BID BM  . levETIRAcetam  500 mg Oral BID  . memantine  10 mg Oral BID  . metoprolol tartrate  12.5 mg Oral BID  . pantoprazole  40 mg Oral Daily  .  rosuvastatin  10 mg Oral q1800  . saccharomyces boulardii  250 mg Oral BID  . sodium chloride  3 mL Intravenous Q12H  . warfarin  5 mg Oral ONCE-1800  . Warfarin - Pharmacist Dosing Inpatient   Does not apply q1800   Continuous Infusions: . sodium chloride 75 mL/hr at 06/24/15 0634   PRN Meds:.oxyCODONE Medications Prior to Admission:  Prior to Admission medications   Medication Sig Start Date End Date Taking? Authorizing Provider  alendronate (FOSAMAX) 70 MG tablet TAKE 1 TABLET ONCE A WEEK ON TUESDAYS. TAKE WITH A FULL GLASS OF WATER ON AN EMPTY STOMACH. 11/09/14  Yes [name] L Reed, DO  ALPRAZolam Prudy Feeler) 0.5 MG tablet Take one tablet by mouth three times daily and one tablets at bedtime as needed for anxiety Patient taking differently: Take 0.25 mg by mouth 3 (three) times daily as needed for anxiety. Take one tablet by mouth three times daily and one tablets at bedtime as needed for anxiety 04/05/15  Yes [name] L Reed, DO  ciprofloxacin (CIPRO) 250  MG tablet Take 1 tablet (250 mg total) by mouth 2 (two) times daily. Patient taking differently: Take 250 mg by mouth 2 (two) times daily. For 10 days 8-24 to 9-2 06/16/15  Yes Kirt Boys, DO  diclofenac sodium (VOLTAREN) 1 % GEL Apply 1 application topically 2 (two) times daily. Apply to knees and hips   Yes Historical Provider, MD  hydrALAZINE (APRESOLINE) 50 MG tablet Take 50 mg by mouth 3 (three) times daily.   Yes Historical Provider, MD  lactose free nutrition (BOOST) LIQD Take 237 mLs by mouth 2 (two) times daily between meals.   Yes Historical Provider, MD  levETIRAcetam (KEPPRA) 500 MG tablet Take 1 tablet (500 mg total) by mouth 2 (two) times daily. 02/17/15  Yes Albertine Grates, MD  losartan (COZAAR) 25 MG tablet TAKE 1 TABLET DAILY FOR BLOOD PRESSURE 04/20/15  Yes [name] L Reed, DO  memantine (NAMENDA) 10 MG tablet TAKE 1 TABLET TWICE A DAY FOR MEMORY LOSS 05/06/15  Yes [name] L Reed, DO  metoprolol tartrate (LOPRESSOR) 25 MG tablet Take  one tablet by mouth twice daily to control pulse Patient taking differently: Take 12.5 mg by mouth 2 (two) times daily.  04/14/15  Yes [name] L Reed, DO  nitroGLYCERIN (NITROSTAT) 0.4 MG SL tablet Place 0.4 mg under the tongue every 5 (five) minutes as needed for chest pain. Take one tablet sublingual every 5 minutes not to exceed three tablets for chest pain   Yes Historical Provider, MD  omeprazole (PRILOSEC) 20 MG capsule TAKE ONE CAPSULE BY MOUTH EVERY DAY FOR REFLUX 06/14/15  Yes [name] L Reed, DO  oxyCODONE (OXY IR/ROXICODONE) 5 MG immediate release tablet Take 2.5 mg by mouth 3 (three) times daily as needed for severe pain.    Yes Historical Provider, MD  rosuvastatin (CRESTOR) 10 MG tablet Take 10 mg by mouth daily.   Yes Historical Provider, MD  saccharomyces boulardii (FLORASTOR) 250 MG capsule Take 1 capsule (250 mg total) by mouth 2 (two) times daily. 06/16/15  Yes Kirt Boys, DO  warfarin (COUMADIN) 2 MG tablet Take 2 mg by mouth every other day. With 5mg  tablet   Yes Historical Provider, MD  warfarin (COUMADIN) 5 MG tablet TAKE 1 TABLET EVERY DAY WITH 2MG  TO EQUAL 7MG  DAILY Patient taking differently: TAKE 1 TABLET EVERY DAY 06/18/15  Yes [name] L Reed, DO  feeding supplement, ENSURE ENLIVE, (ENSURE ENLIVE) LIQD Take 237 mLs by mouth 3 (three) times daily between meals. Patient not taking: Reported on 06/22/2015 02/17/15   Albertine Grates, MD   Allergies  Allergen Reactions  . Risperdal [Risperidone] Other (See Comments)    sedation   CBC:    Component Value Date/Time   WBC 9.7 06/24/2015 0600   WBC 6.1 12/18/2014 1057   HGB 8.3* 06/24/2015 0600   HCT 27.0* 06/24/2015 0600   PLT 273 06/24/2015 0600   MCV 96.1 06/24/2015 0600   NEUTROABS 4.3 06/22/2015 1320   NEUTROABS 3.5 12/18/2014 1057   LYMPHSABS 1.9 06/22/2015 1320   LYMPHSABS 2.2 12/18/2014 1057   MONOABS 0.5 06/22/2015 1320   EOSABS 0.0 06/22/2015 1320   EOSABS 0.0 12/18/2014 1057   BASOSABS 0.0 06/22/2015 1320    BASOSABS 0.1 12/18/2014 1057   Comprehensive Metabolic Panel:    Component Value Date/Time   NA 149* 06/24/2015 0600   NA 143 12/18/2014 1057   K 3.7 06/24/2015 0600   CL 118* 06/24/2015 0600   CO2 27 06/24/2015 0600   BUN 16 06/24/2015 0600  BUN 12 12/18/2014 1057   CREATININE 0.43* 06/24/2015 0600   GLUCOSE 102* 06/24/2015 0600   GLUCOSE 206* 12/18/2014 1057   CALCIUM 9.0 06/24/2015 0600   AST 17 06/23/2015 0526   ALT 18 06/23/2015 0526   ALKPHOS 57 06/23/2015 0526   BILITOT 0.4 06/23/2015 0526   BILITOT <0.2 12/18/2014 1057   PROT 6.8 06/23/2015 0526   PROT 6.2 12/18/2014 1057   ALBUMIN 2.7* 06/23/2015 0526    Physical Exam:  Vital Signs: BP 140/70 mmHg  Pulse 81  Temp(Src) 98 F (36.7 C) (Axillary)  Resp 20  Ht 5\' 1"  (1.549 m)  Wt 47.5 kg (104 lb 11.5 oz)  BMI 19.80 kg/m2  SpO2 100% SpO2: SpO2: 100 % O2 Device: O2 Device: Not Delivered O2 Flow Rate:   Intake/output summary:  Intake/Output Summary (Last 24 hours) at 06/24/15 0912 Last data filed at 06/24/15 0700  Gross per 24 hour  Intake 1446.25 ml  Output    775 ml  Net 671.25 ml   LBM: Last BM Date: 06/23/15 Baseline Weight: Weight: 47.5 kg (104 lb 11.5 oz) Most recent weight: Weight: 47.5 kg (104 lb 11.5 oz)  Exam Findings:   General: non verbal, eyes open, unable to follow commands HEENT: moist buccal membranes CVS: RRR Resp: decreased in bases Abd: soft NT +BS Extrem: without edema Skin: warm and dry, noted EMR documentation of wounds  Neuro: unable to follow commands           Palliative Performance Scale: 20 %                Additional Data Reviewed: Recent Labs     06/23/15  0526  06/24/15  0600  WBC  15.2*  9.7  HGB  8.7*  8.3*  PLT  286  273  NA  147*  149*  BUN  19  16  CREATININE  0.47  0.43*     Time In: 1045 Time Out: 1150 Time Total: 80 min  Greater than 50%  of this time was spent counseling and coordinating care related to the above assessment and  plan.  Discussed with  Dr  Ardyth Harps  Signed by: Lorinda Creed, NP  Canary Brim, NP  06/24/2015, 9:12 AM  Please contact Palliative Medicine Team phone at [phone] for questions and concerns.   See AMION for contact information

## 2015-06-24 NOTE — Progress Notes (Signed)
[facility]                                                                              Patient Demographics  Sabrina Long, is a 79 y.o. female, DOB - 1936-02-25, JXB:[id number]  Admit date - 06/22/2015   Admitting Physician Costin Otelia Sergeant, MD  Outpatient Primary MD for the patient is REED, [name], DO  LOS - 2   Chief Complaint  Patient presents with  . Urinary Frequency       Brief HPI   Sabrina Long is a 79 y.o. female has a past medical history significant for end-stage dementia, bedbound with a chronic Foley catheter, sacral and left hip decubitus ulcers which are cared for as an outpatient, she is status post a 4 week hospitalization at [facility], hypertension, hyperlipidemia, presents to the emergency room with a chief complaint fever. She was brought in by her son as the patient is living with them. In the ED, patient was nonverbal, her eyes closed and not following any commands. Patient's son reported that she was having fever, elevated blood pressure. At the baseline she opens her eyes, eats very well and is mostly nonverbal. He denied any cough or chest congestion, head nausea and vomiting on the day of admission. In the ED, patient is afebrile, no leukocytosis and a UA grossly positive for infection. TRH was asked for admission for UTI. Palliative care consultation with son: she will go home with [facility] services. Not interested in hospice care.   Assessment & Plan    Principal Problem:   Urinary tract infection UTI-  In the setting of bedbound state and chronic Foley catheter - Follow urine cultures and sensitivities, continue IV Rocephin -Remains afebrile and without leukocytosis. -Possibly transition to PO abx in am.  Sacral decubitus and left hip ulcers - Appreciate wound care recs.  History of PE/DVT - continue coumadin   End-stage dementia with failure to thrive, dehydration, lactic acidosis - nutrition consult, SLP  evaluation - seen by palliative medicine: not interested in hospice care, suspect will have recurrent admissions due to inability to take POs, dehydration and hypernatremia. - Continue dysphagia 1 diet   HTN - BP now stable, in ED at the time of admission, BP was hypotensive in 90s - Continue gentle hydration, restart Lopressor   Hypernatremia - Na up to 149 today. -Change IV fluids to D5W  Hypokalemia - Replaced  HLD - continue statin  Seizures -Keppra   Code Status: DO NOT RESUSCITATE  Family Communication: No family member at the bedside  Disposition Plan: Home with [facility] as per conversation between son and palliative care  Time Spent in minutes 25 minutes  Procedures  None  Consults   Wound Care  DVT Prophylaxis  Coumadin  Medications  Scheduled Meds: . cefTRIAXone (ROCEPHIN)  IV  1 g Intravenous Q24H  . collagenase   Topical Daily  . diclofenac sodium  2 g Topical BID  . feeding supplement (ENSURE ENLIVE)  237 mL Oral BID BM  . lactose free nutrition  237 mL Oral BID BM  . levETIRAcetam  500 mg Oral BID  . memantine  10 mg Oral BID  . metoprolol tartrate  12.5 mg Oral BID  . pantoprazole  40 mg Oral Daily  . rosuvastatin  10 mg Oral q1800  . saccharomyces boulardii  250 mg Oral BID  . sodium chloride  3 mL Intravenous Q12H  . warfarin  5 mg Oral ONCE-1800  . Warfarin - Pharmacist Dosing Inpatient   Does not apply q1800   Continuous Infusions: . sodium chloride 75 mL/hr at 06/24/15 0634   PRN Meds:.oxyCODONE   Antibiotics   Anti-infectives    Start     Dose/Rate Route Frequency Ordered Stop   06/23/15 1200  cefTRIAXone (ROCEPHIN) 1 g in dextrose 5 % 50 mL IVPB     1 g 100 mL/hr over 30 Minutes Intravenous Every 24 hours 06/22/15 1502     06/22/15 1145  cefTRIAXone (ROCEPHIN) 1 g in dextrose 5 % 50 mL IVPB     1 g 100 mL/hr over 30 Minutes Intravenous  Once 06/22/15 1137 06/22/15 1307        Subjective:   Sabrina Long was seen and  examined today.  Nonverbal, tracking otherwise unable to obtain review of systems. Afebrile, no acute issues overnight. Appears to be comfortable    Objective:   Blood pressure 147/64, pulse 99, temperature 98.6 F (37 C), temperature source Axillary, resp. rate 18, height 5\' 1"  (1.549 m), weight 47.5 kg (104 lb 11.5 oz), SpO2 100 %.  Wt Readings from Last 3 Encounters:  06/22/15 47.5 kg (104 lb 11.5 oz)  02/22/15 49.896 kg (110 lb)  02/15/15 50 kg (110 lb 3.7 oz)     Intake/Output Summary (Last 24 hours) at 06/24/15 1415 Last data filed at 06/24/15 0700  Gross per 24 hour  Intake 1326.25 ml  Output    775 ml  Net 551.25 ml    Exam  General: Alertawake, tracking, eyes open, dementia, cachetic   HEENT:  PERRLA, EOMI, Anicteric Sclera, mucous membranes moist.   Neck: Supple, no JVD, no masses  CVS: S1 S2 auscultated, no rubs, murmurs or gallops. Regular rate and rhythm.  Respiratory: CTAB  Abdomen: Soft, nontender, nondistended, + bowel sounds  Ext: no cyanosis clubbing or edema  Neuro: does not follow commands  Skin:3-4 cm sacral decub  Psych:does not follow commands  Data Review   Micro Results Recent Results (from the past 240 hour(s))  Urine culture     Status: None   Collection Time: 06/22/15 12:41 PM  Result Value Ref Range Status   Specimen Description URINE, CATHETERIZED  Final   Special Requests NONE  Final   Culture   Final    NO GROWTH 1 DAY Performed at [facility]    Report Status 06/23/2015 FINAL  Final    Radiology Reports No results found.  CBC  Recent Labs Lab 06/22/15 1320 06/23/15 0526 06/24/15 0600  WBC 6.7 15.2* 9.7  HGB 10.9* 8.7* 8.3*  HCT 36.2 28.5* 27.0*  PLT 341 286 273  MCV 95.3 95.0 96.1  MCH 28.7 29.0 29.5  MCHC 30.1 30.5 30.7  RDW 16.8* 16.7* 17.2*  LYMPHSABS 1.9  --   --   MONOABS 0.5  --   --   EOSABS 0.0  --   --   BASOSABS 0.0  --   --     Chemistries   Recent Labs Lab 06/22/15 1320  06/23/15 0526 06/24/15 0600  NA 146* 147* 149*  K 3.5 3.4* 3.7  CL 111 115* 118*  CO2 26  26 27  GLUCOSE 168* 96 102*  BUN 31* 19 16  CREATININE 0.62 0.47 0.43*  CALCIUM 9.6 9.0 9.0  AST 24 17  --   ALT 27 18  --   ALKPHOS 76 57  --   BILITOT 0.4 0.4  --    ------------------------------------------------------------------------------------------------------------------ estimated creatinine clearance is 44.2 mL/min (by C-G formula based on Cr of 0.43). ------------------------------------------------------------------------------------------------------------------ No results for input(s): HGBA1C in the last 72 hours. ------------------------------------------------------------------------------------------------------------------ No results for input(s): CHOL, HDL, LDLCALC, TRIG, CHOLHDL, LDLDIRECT in the last 72 hours. ------------------------------------------------------------------------------------------------------------------ No results for input(s): TSH, T4TOTAL, T3FREE, THYROIDAB in the last 72 hours.  Invalid input(s): FREET3 ------------------------------------------------------------------------------------------------------------------ No results for input(s): VITAMINB12, FOLATE, FERRITIN, TIBC, IRON, RETICCTPCT in the last 72 hours.  Coagulation profile  Recent Labs Lab 06/22/15 1555 06/23/15 0526 06/24/15 0600  INR 2.25* 2.80* 2.92*    No results for input(s): DDIMER in the last 72 hours.  Cardiac Enzymes No results for input(s): CKMB, TROPONINI, MYOGLOBIN in the last 168 hours.  Invalid input(s): CK ------------------------------------------------------------------------------------------------------------------ Invalid input(s): POCBNP  No results for input(s): GLUCAP in the last 72 hours.   Chaya Jan M.D. [facility] 06/24/2015, 2:15 PM  Pager: [phone] Between 7am to 7pm - call Pager - [phone]  After 7pm go to  [email or web address] - password [email or web address]  Call night coverage person covering after 7pm

## 2015-06-25 LAB — BASIC METABOLIC PANEL
Anion gap: 8 (ref 5–15)
BUN: 13 mg/dL (ref 6–20)
CHLORIDE: 106 mmol/L (ref 101–111)
CO2: 24 mmol/L (ref 22–32)
Calcium: 8.9 mg/dL (ref 8.9–10.3)
Creatinine, Ser: 0.41 mg/dL — ABNORMAL LOW (ref 0.44–1.00)
GFR calc Af Amer: >60 mL/min (ref >60)
GFR calc non Af Amer: >60 mL/min (ref >60)
GLUCOSE: 122 mg/dL — AB (ref 65–99)
POTASSIUM: 3.8 mmol/L (ref 3.5–5.1)
SODIUM: 138 mmol/L (ref 135–145)

## 2015-06-25 LAB — PROTIME-INR
INR: 2.83 — ABNORMAL HIGH (ref 0.00–1.49)
Prothrombin Time: 29.3 seconds — ABNORMAL HIGH (ref 11.6–15.2)

## 2015-06-25 MED ORDER — WARFARIN SODIUM 1 MG PO TABS
7.0000 mg | ORAL_TABLET | Freq: Once | ORAL | Status: DC
  Filled 2015-06-25: qty 1

## 2015-06-25 MED ORDER — CIPROFLOXACIN HCL 500 MG PO TABS: 500.0000 mg | ORAL_TABLET | Freq: Two times a day (BID) | ORAL | Status: DC

## 2015-06-25 NOTE — Care Management Note (Signed)
Case Management Note  Patient Details  Name: Sabrina Long MRN: [id number] Date of Birth: 11-27-1935  Subjective/Objective:  Spoke to son Arlys John about d/c plans. Home w/HHRN, [facility] Nurse's aide agreed.  [facility] HH liason Edwinna aware of recommendation.  Will need HHRN/HH Nurse's aide order.Ambulance transp @ d/c-CSW notified.                  Action/Plan:d/c home w/HHC.   Expected Discharge Date:   (unknown)               Expected Discharge Plan:  Home w Home Health Services  In-House Referral:  Clinical Social Work  Discharge planning Services  CM Consult  Post Acute Care Choice:  Home Health (Active w/[facility]) Choice offered to:  Adult Children  DME Arranged:    DME Agency:     HH Arranged:    HH Agency:     Status of Service:  In process, will continue to follow  Medicare Important Message Given:  Yes-second notification given Date Medicare IM Given:    Medicare IM give by:    Date Additional Medicare IM Given:    Additional Medicare Important Message give by:     If discussed at Long Length of Stay Meetings, dates discussed:    Additional Comments:  Lanier Clam, RN 06/25/2015, 10:26 AM

## 2015-06-25 NOTE — Progress Notes (Signed)
CSW consulted for transportation needs. Patient will need non-emergency ambulance transport home. CSW confirmed home address with patient's son, Arlys John.   [facility] called for transport to pickup at 12:30.   No other CSW needs identified - CSW signing off.   Lincoln Maxin, LCSW [facility] Clinical Social Worker cell #: [phone]

## 2015-06-25 NOTE — Progress Notes (Signed)
ANTICOAGULATION CONSULT NOTE - follow up  Pharmacy Consult for warfarin Indication: History of PE  Allergies  Allergen Reactions  . Risperdal [Risperidone] Other (See Comments)    sedation    Patient Measurements: Height:  (154.9 cm) Weight: 104 lb 11.5 oz (47.5 kg) IBW/kg (Calculated) : 47.8  Vital Signs: Temp: 98.1 F (36.7 C) (09/02 0509) Temp Source: Oral (09/02 0509) BP: 171/82 mmHg (09/02 0509) Pulse Rate: 84 (09/02 0509)  Labs:  Recent Labs  06/22/15 1320  06/23/15 0526 06/24/15 0600 06/25/15 0502  HGB 10.9*  --  8.7* 8.3*  --   HCT 36.2  --  28.5* 27.0*  --   PLT 341  --  286 273  --   LABPROT  --   < > 29.1* 30.0* 29.3*  INR  --   < > 2.80* 2.92* 2.83*  CREATININE 0.62  --  0.47 0.43* 0.41*  < > = values in this interval not displayed.  Estimated Creatinine Clearance: 44.2 mL/min (by C-G formula based on Cr of 0.41).   Medical History: Past Medical History  Diagnosis Date  . Unspecified constipation   . Anxiety state, unspecified   . Colitis, enteritis, and gastroenteritis of presumed infectious origin 11/02/2011  . Pneumonia, organism unspecified 11/02/2011  . Cystitis, unspecified   . Impetigo   . Renal artery stenosis   . Dementia in conditions classified elsewhere with behavioral disturbance   . Hypercalcemia   . Reflux esophagitis   . Chest pain, unspecified   . Abnormality of gait   . Hyperosmolality and/or hypernatremia   . Unspecified disorder of liver   . Candidiasis of vulva and vagina   . Alzheimer's disease   . Chest pain, unspecified 08/19/2008  . Dementia in conditions classified elsewhere without behavioral disturbance   . Depressive disorder, not elsewhere classified   . Esophageal reflux   . Other and unspecified hyperlipidemia   . Loss of weight   . Unspecified persistent mental disorders due to conditions classified elsewhere   . Reactive confusion   . Unspecified essential hypertension   . Other seborrheic  keratosis   . Osteoarthrosis, unspecified whether generalized or localized, unspecified site   . Stiffness of joint, not elsewhere classified, unspecified site   . Memory loss 07/24/2007  . Headache(784.0)   . Undiagnosed cardiac murmurs   . Seizures     Medications:  Prescriptions prior to admission  Medication Sig Dispense Refill Last Dose  . alendronate (FOSAMAX) 70 MG tablet TAKE 1 TABLET ONCE A WEEK ON TUESDAYS. TAKE WITH A FULL GLASS OF WATER ON AN EMPTY STOMACH. 12 tablet 3 06/22/2015 at Unknown time  . ALPRAZolam (XANAX) 0.5 MG tablet Take one tablet by mouth three times daily and one tablets at bedtime as needed for anxiety (Patient taking differently: Take 0.25 mg by mouth 3 (three) times daily as needed for anxiety. Take one tablet by mouth three times daily and one tablets at bedtime as needed for anxiety) 120 tablet 3 06/22/2015 at Unknown time  . ciprofloxacin (CIPRO) 250 MG tablet Take 1 tablet (250 mg total) by mouth 2 (two) times daily. (Patient taking differently: Take 250 mg by mouth 2 (two) times daily. For 10 days 8-24 to 9-2) 6 tablet 0 06/22/2015 at Unknown time  . diclofenac sodium (VOLTAREN) 1 % GEL Apply 1 application topically 2 (two) times daily. Apply to knees and hips   06/22/2015 at Unknown time  . hydrALAZINE (APRESOLINE) 50 MG tablet Take 50 mg by  mouth 3 (three) times daily.   06/22/2015 at Unknown time  . lactose free nutrition (BOOST) LIQD Take 237 mLs by mouth 2 (two) times daily between meals.   06/21/2015 at Unknown time  . levETIRAcetam (KEPPRA) 500 MG tablet Take 1 tablet (500 mg total) by mouth 2 (two) times daily. 60 tablet 3 06/22/2015 at 0920  . losartan (COZAAR) 25 MG tablet TAKE 1 TABLET DAILY FOR BLOOD PRESSURE 90 tablet 0 06/22/2015 at Unknown time  . memantine (NAMENDA) 10 MG tablet TAKE 1 TABLET TWICE A DAY FOR MEMORY LOSS 180 tablet 1 06/22/2015 at Unknown time  . metoprolol tartrate (LOPRESSOR) 25 MG tablet Take one tablet by mouth twice daily to  control pulse (Patient taking differently: Take 12.5 mg by mouth 2 (two) times daily. ) 60 tablet 3 06/22/2015 at 0920  . nitroGLYCERIN (NITROSTAT) 0.4 MG SL tablet Place 0.4 mg under the tongue every 5 (five) minutes as needed for chest pain. Take one tablet sublingual every 5 minutes not to exceed three tablets for chest pain   PRN  . omeprazole (PRILOSEC) 20 MG capsule TAKE ONE CAPSULE BY MOUTH EVERY DAY FOR REFLUX 30 capsule 2 06/22/2015 at Unknown time  . oxyCODONE (OXY IR/ROXICODONE) 5 MG immediate release tablet Take 2.5 mg by mouth 3 (three) times daily as needed for severe pain.    06/22/2015 at Unknown time  . rosuvastatin (CRESTOR) 10 MG tablet Take 10 mg by mouth daily.   06/22/2015 at Unknown time  . saccharomyces boulardii (FLORASTOR) 250 MG capsule Take 1 capsule (250 mg total) by mouth 2 (two) times daily. 14 capsule 0 06/22/2015 at Unknown time  . warfarin (COUMADIN) 2 MG tablet Take 2 mg by mouth every other day. With 5mg  tablet   06/21/2015 at 2030  . warfarin (COUMADIN) 5 MG tablet TAKE 1 TABLET EVERY DAY WITH 2MG  TO EQUAL 7MG  DAILY (Patient taking differently: TAKE 1 TABLET EVERY DAY) 30 tablet 2 06/21/2015 at 2030  . feeding supplement, ENSURE ENLIVE, (ENSURE ENLIVE) LIQD Take 237 mLs by mouth 3 (three) times daily between meals. (Patient not taking: Reported on 06/22/2015) 237 mL 12 Taking    Assessment: 79yo F w/ end-stage dementia and chronic pressure ulcers from home with reported fever. Suspected UTI. Pharmacy is asked to manage Coumadin.  06/25/2015  Home dose is 5mg  alternating with 7mg .  INR 2.83 (therapeutic)  CBC is ok.   Consuming 100% of diet(dysphagia/thin) plus supplements  Drug interactions: none (had been taking Cipro since 8/24 which can increase the INR, last dose 8/30, should have no effect at this time)  Goal of Therapy:  INR 2-3 Monitor platelets by anticoagulation protocol: Yes   Plan:  Give Coumadin 7mg  today as per home regimen. Check PT/INR  daily.  Arley Phenix RPh 06/25/2015, 8:27 AM Pager [phone]

## 2015-06-25 NOTE — Progress Notes (Signed)
Patient discharge to home. PIV removed by NT no s/s of infiltration or swelling noted. Wound dressing changed done this morning. Called son Arlys John for d/c home order and pt will be picked up by [facility].

## 2015-06-25 NOTE — Care Management Note (Signed)
Case Management Note  Patient Details  Name: Sabrina Long MRN: [id number] Date of Birth: May 22, 1936  Subjective/Objective: HHRN, HH Nurse's aide ordered.[facility] liason notified.                   Action/Plan:d/c plan home w/HHC.   Expected Discharge Date:   (unknown)               Expected Discharge Plan:  Home w Home Health Services  In-House Referral:  Clinical Social Work  Discharge planning Services  CM Consult  Post Acute Care Choice:  Home Health (Active w/[facility]) Choice offered to:  Adult Children  DME Arranged:    DME Agency:     HH Arranged:  RN, Nurse's Aide HH Agency:  [facility]  Status of Service:  In process, will continue to follow  Medicare Important Message Given:  Yes-second notification given Date Medicare IM Given:    Medicare IM give by:    Date Additional Medicare IM Given:    Additional Medicare Important Message give by:     If discussed at Long Length of Stay Meetings, dates discussed:    Additional Comments:  Lanier Clam, RN 06/25/2015, 10:41 AM

## 2015-06-25 NOTE — Discharge Summary (Addendum)
Physician Discharge Summary  Sabrina Long AVW:[id number] DOB: [birth year] DOA: 06/22/2015  PCP: Bufford Spikes, DO  Admit date: 06/22/2015 Discharge date: 06/25/2015  Time spent: 45 minutes  Recommendations for Outpatient Follow-up:  -Will be discharged home today. -To resume HH services.   Discharge Diagnoses:  Principal Problem:   UTI (urinary tract infection) Active Problems:   Dementia with behavioral disturbance   Hyperlipidemia with target LDL less than 100   Hypernatremia   FTT (failure to thrive) in adult   Alzheimer's disease   UTI (lower urinary tract infection)   Pressure ulcer   History of pulmonary embolus (PE)   Sacral ulcer   Discharge Condition: Guarded, poor at high risk for readmission  Filed Weights   06/22/15 1547  Weight: 47.5 kg (104 lb 11.5 oz)    History of present illness:  As per Dr. Elvera Lennox 8/30: Sabrina Long is a 79 y.o. female has a past medical history significant for end-stage dementia, bedbound with a chronic Foley catheter, sacral and left hip decubitus ulcers which are cared for as an outpatient, she is status post a 4 week hospitalization at [facility], hypertension, hyperlipidemia, presents to the emergency room with a chief complaint fever. She was brought in by her son as the patient is living with them. In the ED, patient is nonverbal, her eyes are closed and she is not following any commands so the story is per her son. He states that she has been having fever and elevated blood pressure. He tells me that at baseline, she opens her eyes, eats very well and is mostly nonverbal. He denies hearing any cough or chest congestion, he tells me that she has had nausea and vomiting today. In the ED, patient is afebrile, no leukocytosis and a UA grossly positive for infection. [facility] was asked for admission for UTI.    Hospital Course:   Urinary tract infection UTI secondary to indwelling foley catheter- In the setting of bedbound  state and chronic Foley catheter - Will DC on cipro for 5 more days. -Urine cx remains negative. -Remains afebrile and without leukocytosis.  Sacral decubitus and left hip ulcers - Appreciate wound care recs.  History of PE/DVT - continue coumadin   End-stage dementia with failure to thrive, dehydration, lactic acidosis - nutrition consult, SLP evaluation - seen by palliative medicine: not interested in hospice care, suspect will have recurrent admissions due to inability to take POs, dehydration and hypernatremia. - Continue dysphagia 1 diet   HTN - BP now stable, in ED at the time of admission, BP was hypotensive in 90s - Continue gentle hydration, restart Lopressor   Hypernatremia - Resolved with dextrose fluids. Due to dementia and inability to consume appropriate amounts of free water.  Hypokalemia - Replaced  HLD - continue statin  Seizures -Keppra    Procedures:  None   Consultations:  None  Discharge Instructions  Discharge Instructions    Diet - low sodium heart healthy    Complete by:  As directed      Increase activity slowly    Complete by:  As directed             Medication List    TAKE these medications        alendronate 70 MG tablet  Commonly known as:  FOSAMAX  TAKE 1 TABLET ONCE A WEEK ON TUESDAYS. TAKE WITH A FULL GLASS OF WATER ON AN EMPTY STOMACH.     ALPRAZolam 0.5 MG tablet  Commonly known as:  XANAX  Take one tablet by mouth three times daily and one tablets at bedtime as needed for anxiety     ciprofloxacin 500 MG tablet  Commonly known as:  CIPRO  Take 1 tablet (500 mg total) by mouth 2 (two) times daily.     diclofenac sodium 1 % Gel  Commonly known as:  VOLTAREN  Apply 1 application topically 2 (two) times daily. Apply to knees and hips     lactose free nutrition Liqd  Take 237 mLs by mouth 2 (two) times daily between meals.     feeding supplement (ENSURE ENLIVE) Liqd  Take 237 mLs by mouth 3 (three) times  daily between meals.     hydrALAZINE 50 MG tablet  Commonly known as:  APRESOLINE  Take 50 mg by mouth 3 (three) times daily.     levETIRAcetam 500 MG tablet  Commonly known as:  KEPPRA  Take 1 tablet (500 mg total) by mouth 2 (two) times daily.     losartan 25 MG tablet  Commonly known as:  COZAAR  TAKE 1 TABLET DAILY FOR BLOOD PRESSURE     memantine 10 MG tablet  Commonly known as:  NAMENDA  TAKE 1 TABLET TWICE A DAY FOR MEMORY LOSS     metoprolol tartrate 25 MG tablet  Commonly known as:  LOPRESSOR  Take one tablet by mouth twice daily to control pulse     nitroGLYCERIN 0.4 MG SL tablet  Commonly known as:  NITROSTAT  Place 0.4 mg under the tongue every 5 (five) minutes as needed for chest pain. Take one tablet sublingual every 5 minutes not to exceed three tablets for chest pain     omeprazole 20 MG capsule  Commonly known as:  PRILOSEC  TAKE ONE CAPSULE BY MOUTH EVERY DAY FOR REFLUX     oxyCODONE 5 MG immediate release tablet  Commonly known as:  Oxy IR/ROXICODONE  Take 2.5 mg by mouth 3 (three) times daily as needed for severe pain.     rosuvastatin 10 MG tablet  Commonly known as:  CRESTOR  Take 10 mg by mouth daily.     saccharomyces boulardii 250 MG capsule  Commonly known as:  FLORASTOR  Take 1 capsule (250 mg total) by mouth 2 (two) times daily.     warfarin 2 MG tablet  Commonly known as:  COUMADIN  Take 2 mg by mouth every other day. With 5mg  tablet     warfarin 5 MG tablet  Commonly known as:  COUMADIN  TAKE 1 TABLET EVERY DAY WITH 2MG  TO EQUAL 7MG  DAILY       Allergies  Allergen Reactions  . Risperdal [Risperidone] Other (See Comments)    sedation       Follow-up Information    Follow up with REED, [name], DO. Schedule an appointment as soon as possible for a visit in 2 weeks.   Specialty:  Geriatric Medicine   Contact information:   [address] [place] [address] [phone]       Follow up with [facility].    Specialty:  Home Health Services   Why:  HHRN/HH Nurse's aide.   Contact information:   [address] [place] [address] [phone]        The results of significant diagnostics from this hospitalization (including imaging, microbiology, ancillary and laboratory) are listed below for reference.    Significant Diagnostic Studies: No results found.  Microbiology: Recent Results (  from the past 240 hour(s))  Urine culture     Status: None   Collection Time: 06/22/15 12:41 PM  Result Value Ref Range Status   Specimen Description URINE, CATHETERIZED  Final   Special Requests NONE  Final   Culture   Final    NO GROWTH 1 DAY Performed at [facility]    Report Status 06/23/2015 FINAL  Final     Labs: Basic Metabolic Panel:  Recent Labs Lab 06/22/15 1320 06/23/15 0526 06/24/15 0600 06/25/15 0502  NA 146* 147* 149* 138  K 3.5 3.4* 3.7 3.8  CL 111 115* 118* 106  CO2 GLUCOSE 168* 96 102* 122*  BUN 31* CREATININE 0.62 0.47 0.43* 0.41*  CALCIUM 9.6 9.0 9.0 8.9   Liver Function Tests:  Recent Labs Lab 06/22/15 1320 06/23/15 0526  AST 24 17  ALT 27 18  ALKPHOS 76 57  BILITOT 0.4 0.4  PROT 8.2* 6.8  ALBUMIN 3.3* 2.7*   No results for input(s): LIPASE, AMYLASE in the last 168 hours. No results for input(s): AMMONIA in the last 168 hours. CBC:  Recent Labs Lab 06/22/15 1320 06/23/15 0526 06/24/15 0600  WBC 6.7 15.2* 9.7  NEUTROABS 4.3  --   --   HGB 10.9* 8.7* 8.3*  HCT 36.2 28.5* 27.0*  MCV 95.3 95.0 96.1  PLT 341 286 273   Cardiac Enzymes: No results for input(s): CKTOTAL, CKMB, CKMBINDEX, TROPONINI in the last 168 hours. BNP: BNP (last 3 results) No results for input(s): BNP in the last 8760 hours.  ProBNP (last 3 results) No results for input(s): PROBNP in the last 8760 hours.  CBG: No results for input(s): GLUCAP in the last 168 hours.     SignedChaya Jan  [facility] Pager: [phone] 06/25/2015, 2:19 PM

## 2015-06-30 ENCOUNTER — Telehealth: Payer: Self-pay

## 2015-06-30 NOTE — Telephone Encounter (Signed)
Need orders to resume care since patient was recently hospitalized  1.) Continue wound care  2 x this week, 3 x next week, 2 x 3 weeks following.  2.) Verbal ok to get additional orders from Dr.Robson 3.) Order to continue PT/INR checks (when should next one be)   Please advise

## 2015-07-01 NOTE — Telephone Encounter (Signed)
Received a message on my voicemail from Shannon with [name] stating she has yet received a call regarding orders for patient. I left ANOTHER message stating what Dr. Renato Gails recommended and also informed her that I did call and leave the orders on her voicemail at 12:51 and this was the second message I am leaving.

## 2015-07-01 NOTE — Telephone Encounter (Signed)
Called and LMOM with verbal orders 

## 2015-07-01 NOTE — Telephone Encounter (Signed)
Ok to resume all home health and get wound care orders from Dr. Leanord Hawking. INR check date not specified in discharge summary so recommend as soon as possible and by 9/9.

## 2015-07-02 ENCOUNTER — Telehealth: Payer: Self-pay

## 2015-07-02 ENCOUNTER — Encounter (HOSPITAL_BASED_OUTPATIENT_CLINIC_OR_DEPARTMENT_OTHER): Payer: Medicare Other | Attending: Internal Medicine

## 2015-07-02 DIAGNOSIS — I252 Old myocardial infarction: Secondary | ICD-10-CM | POA: Insufficient documentation

## 2015-07-02 DIAGNOSIS — L89154 Pressure ulcer of sacral region, stage 4: Secondary | ICD-10-CM | POA: Diagnosis not present

## 2015-07-02 DIAGNOSIS — L89512 Pressure ulcer of right ankle, stage 2: Secondary | ICD-10-CM | POA: Diagnosis not present

## 2015-07-02 DIAGNOSIS — I10 Essential (primary) hypertension: Secondary | ICD-10-CM | POA: Diagnosis not present

## 2015-07-02 DIAGNOSIS — L89224 Pressure ulcer of left hip, stage 4: Secondary | ICD-10-CM | POA: Diagnosis not present

## 2015-07-02 DIAGNOSIS — G40909 Epilepsy, unspecified, not intractable, without status epilepticus: Secondary | ICD-10-CM | POA: Insufficient documentation

## 2015-07-02 DIAGNOSIS — Z86718 Personal history of other venous thrombosis and embolism: Secondary | ICD-10-CM | POA: Insufficient documentation

## 2015-07-02 DIAGNOSIS — M1990 Unspecified osteoarthritis, unspecified site: Secondary | ICD-10-CM | POA: Diagnosis not present

## 2015-07-02 DIAGNOSIS — F0390 Unspecified dementia without behavioral disturbance: Secondary | ICD-10-CM | POA: Insufficient documentation

## 2015-07-02 DIAGNOSIS — G8250 Quadriplegia, unspecified: Secondary | ICD-10-CM | POA: Diagnosis not present

## 2015-07-02 LAB — POCT INR: INR: 1.4 — AB (ref 0.9–1.1)

## 2015-07-02 MED ORDER — WARFARIN SODIUM 2 MG PO TABS: 2.0000 mg | ORAL_TABLET | ORAL | Status: DC

## 2015-07-02 NOTE — Telephone Encounter (Signed)
Sabrina Long from [facility] called with PT/INR 1.4 I called Sabrina Long to clarify what patient is currently taking. Patient is taking 5 mg alternating with 7 mg  I called Dr.Reed and she indicated for patient to take 7 mg x 3 days and resume schedule of 7 mg alternating with 5 mg. Recheck in 1 week.   I called and informed Sabrina Long. Sabrina Long aware also.

## 2015-07-05 ENCOUNTER — Other Ambulatory Visit: Payer: Self-pay | Admitting: *Deleted

## 2015-07-05 MED ORDER — OXYCODONE HCL 5 MG PO TABS: 2.5000 mg | ORAL_TABLET | Freq: Three times a day (TID) | ORAL | Status: DC | PRN

## 2015-07-05 NOTE — Telephone Encounter (Signed)
Patient son stopped by office and requested medication. Was suppose to pick up 9/9 but couldn't come by, picking up today.

## 2015-07-08 ENCOUNTER — Other Ambulatory Visit: Payer: Self-pay | Admitting: *Deleted

## 2015-07-08 ENCOUNTER — Telehealth: Payer: Self-pay | Admitting: *Deleted

## 2015-07-08 NOTE — Telephone Encounter (Signed)
Please type letter up and I will sign

## 2015-07-08 NOTE — Telephone Encounter (Signed)
Patient daughter, Angelique Blonder called and requested a letter for the [place] stating that patient has Alzheimer's and physically and financially not able to handle her own affairs. Needs this letter to turn into the [place]. Please Advise.

## 2015-07-09 ENCOUNTER — Telehealth: Payer: Self-pay

## 2015-07-09 LAB — POCT INR: INR: 1.9 — AB (ref <=1.1)

## 2015-07-09 NOTE — Telephone Encounter (Signed)
Letter typed and given to Dr. Renato Gails to review and sign.

## 2015-07-09 NOTE — Telephone Encounter (Signed)
Angelique Blonder called to pick up.

## 2015-07-09 NOTE — Telephone Encounter (Signed)
Patients nurse Herbert Seta called to give patients latest  INR which is 1.9 and is waiting for your orders ,Please advise

## 2015-07-10 NOTE — Telephone Encounter (Signed)
Received Saturday afternoon.  Need current dose of coumadin and last INR.

## 2015-07-12 NOTE — Telephone Encounter (Signed)
Called spoke with the nurse of the patient Herbert Seta informed her of the doctors orders said she understood.

## 2015-07-12 NOTE — Telephone Encounter (Signed)
Pt to be taking 7.5mg  alternating with  not  alternating with .

## 2015-07-12 NOTE — Telephone Encounter (Signed)
This was the patient last instruction,7 mg x 3 days and resume schedule of 7 mg alternating with 5 mg. Please advise

## 2015-07-16 ENCOUNTER — Telehealth: Payer: Self-pay | Admitting: *Deleted

## 2015-07-16 DIAGNOSIS — L89154 Pressure ulcer of sacral region, stage 4: Secondary | ICD-10-CM | POA: Diagnosis not present

## 2015-07-16 DIAGNOSIS — I252 Old myocardial infarction: Secondary | ICD-10-CM | POA: Diagnosis not present

## 2015-07-16 DIAGNOSIS — L89224 Pressure ulcer of left hip, stage 4: Secondary | ICD-10-CM | POA: Diagnosis not present

## 2015-07-16 DIAGNOSIS — I10 Essential (primary) hypertension: Secondary | ICD-10-CM | POA: Diagnosis not present

## 2015-07-16 LAB — POCT INR: INR: 1.8 — AB (ref 0.9–1.1)

## 2015-07-16 NOTE — Telephone Encounter (Signed)
[name] called back--gave verbal orders and agreed.

## 2015-07-16 NOTE — Telephone Encounter (Signed)
[name] with [name] called with [facility] Results 07/16/2015  INR:  1.8  Current dose of Coumadin: 7.5mg  alternating  and no diet changes. Printed and given to Dr. Renato Gails to review and sign.

## 2015-07-16 NOTE — Telephone Encounter (Signed)
Per Dr. Val Riles to Coumadin 7.5mg  daily and recheck on Wednesday. Patient son notified and agreed and [facility] for [facility] instructions.

## 2015-07-17 ENCOUNTER — Other Ambulatory Visit: Payer: Self-pay | Admitting: Internal Medicine

## 2015-07-21 ENCOUNTER — Telehealth: Payer: Self-pay

## 2015-07-21 LAB — POCT INR: INR: 2.4 — AB (ref 0.9–1.1)

## 2015-07-21 NOTE — Telephone Encounter (Signed)
Sabrina Long was calling with PT/INR Results: 2.4, patient currently taking 7.5 mg daily  [name] also needs verbal order for U/A and culture. Patient with low-grade temp of 99.6. [name] informed I will contact PCP and call back with instructions  I called Dr.Reed on her mobil phone. Patient to continue 7.5 mg daily and recheck PT/INR in 1 week also ok to give verbal order for u/a and culture   I called [name] back and gave instructions/recommendations, [name] verbalized understanding.

## 2015-07-22 ENCOUNTER — Emergency Department (HOSPITAL_COMMUNITY): Payer: Medicare Other

## 2015-07-22 ENCOUNTER — Emergency Department (HOSPITAL_COMMUNITY)
Admission: EM | Admit: 2015-07-22 | Discharge: 2015-07-23 | Disposition: A | Discharge status: Home / Self Care | Payer: Medicare Other | Attending: Emergency Medicine | Admitting: Emergency Medicine

## 2015-07-22 ENCOUNTER — Telehealth: Payer: Self-pay | Admitting: *Deleted

## 2015-07-22 ENCOUNTER — Encounter (HOSPITAL_COMMUNITY): Payer: Self-pay | Admitting: Emergency Medicine

## 2015-07-22 DIAGNOSIS — Z7901 Long term (current) use of anticoagulants: Secondary | ICD-10-CM | POA: Insufficient documentation

## 2015-07-22 DIAGNOSIS — Z872 Personal history of diseases of the skin and subcutaneous tissue: Secondary | ICD-10-CM | POA: Diagnosis not present

## 2015-07-22 DIAGNOSIS — E785 Hyperlipidemia, unspecified: Secondary | ICD-10-CM | POA: Insufficient documentation

## 2015-07-22 DIAGNOSIS — R4182 Altered mental status, unspecified: Secondary | ICD-10-CM | POA: Diagnosis present

## 2015-07-22 DIAGNOSIS — Z8701 Personal history of pneumonia (recurrent): Secondary | ICD-10-CM | POA: Diagnosis not present

## 2015-07-22 DIAGNOSIS — Z791 Long term (current) use of non-steroidal anti-inflammatories (NSAID): Secondary | ICD-10-CM | POA: Insufficient documentation

## 2015-07-22 DIAGNOSIS — G309 Alzheimer's disease, unspecified: Secondary | ICD-10-CM | POA: Insufficient documentation

## 2015-07-22 DIAGNOSIS — Z8619 Personal history of other infectious and parasitic diseases: Secondary | ICD-10-CM | POA: Diagnosis not present

## 2015-07-22 DIAGNOSIS — F0280 Dementia in other diseases classified elsewhere without behavioral disturbance: Secondary | ICD-10-CM | POA: Diagnosis not present

## 2015-07-22 DIAGNOSIS — Z79899 Other long term (current) drug therapy: Secondary | ICD-10-CM | POA: Insufficient documentation

## 2015-07-22 DIAGNOSIS — K219 Gastro-esophageal reflux disease without esophagitis: Secondary | ICD-10-CM | POA: Insufficient documentation

## 2015-07-22 DIAGNOSIS — I10 Essential (primary) hypertension: Secondary | ICD-10-CM | POA: Diagnosis not present

## 2015-07-22 DIAGNOSIS — R000 Tachycardia, unspecified: Secondary | ICD-10-CM | POA: Insufficient documentation

## 2015-07-22 DIAGNOSIS — R630 Anorexia: Secondary | ICD-10-CM | POA: Diagnosis not present

## 2015-07-22 DIAGNOSIS — R5383 Other fatigue: Secondary | ICD-10-CM | POA: Diagnosis not present

## 2015-07-22 DIAGNOSIS — N390 Urinary tract infection, site not specified: Secondary | ICD-10-CM | POA: Diagnosis not present

## 2015-07-22 DIAGNOSIS — M1990 Unspecified osteoarthritis, unspecified site: Secondary | ICD-10-CM | POA: Diagnosis not present

## 2015-07-22 DIAGNOSIS — F419 Anxiety disorder, unspecified: Secondary | ICD-10-CM | POA: Diagnosis not present

## 2015-07-22 DIAGNOSIS — R011 Cardiac murmur, unspecified: Secondary | ICD-10-CM | POA: Diagnosis not present

## 2015-07-22 LAB — CBC WITH DIFFERENTIAL/PLATELET
BASOS PCT: 1 %
Basophils Absolute: 0.1 10*3/uL (ref 0.0–0.1)
EOS ABS: 0.1 10*3/uL (ref 0.0–0.7)
EOS PCT: 1 %
HCT: 35.7 % — ABNORMAL LOW (ref 36.0–46.0)
HEMOGLOBIN: 11.7 g/dL — AB (ref 12.0–15.0)
LYMPHS ABS: 3.4 10*3/uL (ref 0.7–4.0)
Lymphocytes Relative: 32 %
MCH: 29.4 pg (ref 26.0–34.0)
MCHC: 32.8 g/dL (ref 30.0–36.0)
MCV: 89.7 fL (ref 78.0–100.0)
Monocytes Absolute: 0.9 10*3/uL (ref 0.1–1.0)
Monocytes Relative: 9 %
NEUTROS PCT: 57 %
Neutro Abs: 6.2 10*3/uL (ref 1.7–7.7)
PLATELETS: 395 10*3/uL (ref 150–400)
RBC: 3.98 MIL/uL (ref 3.87–5.11)
RDW: 15.9 % — ABNORMAL HIGH (ref 11.5–15.5)
WBC: 10.6 10*3/uL — AB (ref 4.0–10.5)

## 2015-07-22 LAB — COMPREHENSIVE METABOLIC PANEL
ALK PHOS: 69 U/L (ref 38–126)
ALT: 27 U/L (ref 14–54)
ANION GAP: 8 (ref 5–15)
AST: 43 U/L — ABNORMAL HIGH (ref 15–41)
Albumin: 3.3 g/dL — ABNORMAL LOW (ref 3.5–5.0)
BILIRUBIN TOTAL: 1.7 mg/dL — AB (ref 0.3–1.2)
BUN: 19 mg/dL (ref 6–20)
CALCIUM: 9.9 mg/dL (ref 8.9–10.3)
CO2: 25 mmol/L (ref 22–32)
CREATININE: 0.55 mg/dL (ref 0.44–1.00)
Chloride: 104 mmol/L (ref 101–111)
GFR calc non Af Amer: >60 mL/min (ref >60)
Glucose, Bld: 111 mg/dL — ABNORMAL HIGH (ref 65–99)
Potassium: 5.9 mmol/L — ABNORMAL HIGH (ref 3.5–5.1)
Sodium: 137 mmol/L (ref 135–145)
TOTAL PROTEIN: 8.4 g/dL — AB (ref 6.5–8.1)

## 2015-07-22 LAB — URINALYSIS, ROUTINE W REFLEX MICROSCOPIC
Bilirubin Urine: NEGATIVE
GLUCOSE, UA: NEGATIVE mg/dL
Hgb urine dipstick: NEGATIVE
KETONES UR: NEGATIVE mg/dL
NITRITE: NEGATIVE
PH: 7.5 (ref 5.0–8.0)
Protein, ur: 100 mg/dL — AB
SPECIFIC GRAVITY, URINE: 1.018 (ref 1.005–1.030)
Urobilinogen, UA: 0.2 mg/dL (ref 0.0–1.0)

## 2015-07-22 LAB — I-STAT CG4 LACTIC ACID, ED: LACTIC ACID, VENOUS: 1.48 mmol/L (ref 0.5–2.0)

## 2015-07-22 LAB — URINE MICROSCOPIC-ADD ON

## 2015-07-22 MED ORDER — SODIUM CHLORIDE 0.9 % IV BOLUS (SEPSIS)
500.0000 mL | Freq: Once | INTRAVENOUS | Status: AC
  Administered 2015-07-22: 500 mL via INTRAVENOUS

## 2015-07-22 MED ORDER — SODIUM CHLORIDE 0.9 % IV SOLN
Freq: Once | INTRAVENOUS | Status: AC
  Administered 2015-07-22: 500 mL via INTRAVENOUS

## 2015-07-22 MED ORDER — DEXTROSE 5 % IV SOLN
1.0000 g | Freq: Once | INTRAVENOUS | Status: AC
  Administered 2015-07-22: 1 g via INTRAVENOUS
  Filled 2015-07-22: qty 10

## 2015-07-22 NOTE — Telephone Encounter (Signed)
Received U/A Results from [facility] [phone] Fax: [phone]. Per Dr. Renato Gails--  +UA await c&s. Push fluids Patient son notified and agreed. Call in Rx to [facility].

## 2015-07-22 NOTE — ED Notes (Signed)
Bed: WU98 Expected date:  Expected time:  Means of arrival:  Comments: [facility]

## 2015-07-22 NOTE — ED Notes (Addendum)
Per ems pt is from home, hx of dementia, has DNR. Ems called out today for altered mental status. Doctor called today and told family urinalysis showed UTI. Son called ems.   Pt has wound vac to sacral area, foley in place. Urine smells foul. Pt has not been started on abx.   When son arrived he reports pt has stage V dementia, non verbal x3 years. But today pt stopped eating and drinking, and normally she eats and drinks a lot.

## 2015-07-23 DIAGNOSIS — N390 Urinary tract infection, site not specified: Secondary | ICD-10-CM | POA: Diagnosis not present

## 2015-07-23 LAB — BASIC METABOLIC PANEL
ANION GAP: 5 (ref 5–15)
BUN: 18 mg/dL (ref 6–20)
CHLORIDE: 108 mmol/L (ref 101–111)
CO2: 25 mmol/L (ref 22–32)
CREATININE: 0.45 mg/dL (ref 0.44–1.00)
Calcium: 9.4 mg/dL (ref 8.9–10.3)
GFR calc non Af Amer: >60 mL/min (ref >60)
Glucose, Bld: 116 mg/dL — ABNORMAL HIGH (ref 65–99)
POTASSIUM: 4.3 mmol/L (ref 3.5–5.1)
SODIUM: 138 mmol/L (ref 135–145)

## 2015-07-23 LAB — I-STAT CG4 LACTIC ACID, ED: Lactic Acid, Venous: 1.6 mmol/L (ref 0.5–2.0)

## 2015-07-23 MED ORDER — CEPHALEXIN 500 MG PO CAPS: 500.0000 mg | ORAL_CAPSULE | Freq: Four times a day (QID) | ORAL | Status: DC

## 2015-07-23 MED ORDER — SODIUM CHLORIDE 0.9 % IV BOLUS (SEPSIS)
500.0000 mL | Freq: Once | INTRAVENOUS | Status: AC
  Administered 2015-07-23: 500 mL via INTRAVENOUS

## 2015-07-23 NOTE — Discharge Instructions (Signed)

## 2015-07-23 NOTE — ED Provider Notes (Signed)
CSN: [id number]     Arrival date & time 07/22/15  1850 History   First MD Initiated Contact with Patient 07/22/15 1924     Chief Complaint  Patient presents with  . Altered Mental Status     (Consider location/radiation/quality/duration/timing/severity/associated sxs/prior Treatment) HPI Comments: 79 y.o. Female with history of alzheimer's disease, chest pain, seizures presents with her son for concern for change in mental status and decreased PO intake.  He said that she has just been more tired than usual but that she was seen by her primary care doctor who called today to say that the patient appeared to have a UTI but did not want to start antibiotics until they got a culture result.  The patient's son felt that her symptoms were related to the UTI and when she did not eat much for him at dinner he brought her in.  He said that he would like to take her home but felt she probably needed fluids and was concerned she needed an antibiotic.    Patient is a 79 y.o. female presenting with altered mental status.  Altered Mental Status   Past Medical History  Diagnosis Date  . Unspecified constipation   . Anxiety state, unspecified   . Colitis, enteritis, and gastroenteritis of presumed infectious origin 11/02/2011  . Pneumonia, organism unspecified 11/02/2011  . Cystitis, unspecified   . Impetigo   . Renal artery stenosis   . Dementia in conditions classified elsewhere with behavioral disturbance   . Hypercalcemia   . Reflux esophagitis   . Chest pain, unspecified   . Abnormality of gait   . Hyperosmolality and/or hypernatremia   . Unspecified disorder of liver   . Candidiasis of vulva and vagina   . Alzheimer's disease   . Chest pain, unspecified 08/19/2008  . Dementia in conditions classified elsewhere without behavioral disturbance   . Depressive disorder, not elsewhere classified   . Esophageal reflux   . Other and unspecified hyperlipidemia   . Loss of weight   . 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 Reactive confusion   . Unspecified essential hypertension   . Other seborrheic keratosis   . Osteoarthrosis, unspecified whether generalized or localized, unspecified site   . Stiffness of joint, not elsewhere classified, unspecified site   . Memory loss 07/24/2007  . Headache(784.0)   . Undiagnosed cardiac murmurs   . Seizures    Past Surgical History  Procedure Laterality Date  . Cesarean section     History reviewed. No pertinent family history. Social History  Substance Use Topics  . Smoking status: Never Smoker   . Smokeless tobacco: Never Used  . Alcohol Use: No   OB History    No data available     ROS limited secondary to patient's mental status Review of Systems  Unable to perform ROS: Dementia  Constitutional: Positive for appetite change and fatigue.  Genitourinary:       Change in urine consistancy      Allergies  Risperdal  Home Medications   Prior to Admission medications   Medication Sig Start Date End Date Taking? Authorizing Provider  alendronate (FOSAMAX) 70 MG tablet TAKE 1 TABLET ONCE A WEEK ON TUESDAYS. TAKE WITH A FULL GLASS OF WATER ON AN EMPTY STOMACH. 11/09/14  Yes [name] L Reed, DO  ALPRAZolam Prudy Feeler) 0.5 MG tablet Take one tablet by mouth three times daily and one tablets at bedtime as needed for anxiety Patient taking differently: Take  0.25 mg by mouth 3 (three) times daily as needed for anxiety. Take one tablet by mouth three times daily and one tablets at bedtime as needed for anxiety 04/05/15  Yes [name] L Reed, DO  diclofenac sodium (VOLTAREN) 1 % GEL Apply 1 application topically 2 (two) times daily. Apply to knees and hips   Yes Historical Provider, MD  feeding supplement, ENSURE ENLIVE, (ENSURE ENLIVE) LIQD Take 237 mLs by mouth 3 (three) times daily between meals. 02/17/15  Yes Albertine Grates, MD  hydrALAZINE (APRESOLINE) 50 MG tablet Take 50 mg by mouth 3 (three) times  daily.   Yes Historical Provider, MD  lactose free nutrition (BOOST) LIQD Take 237 mLs by mouth 2 (two) times daily between meals.   Yes Historical Provider, MD  levETIRAcetam (KEPPRA) 500 MG tablet Take 1 tablet (500 mg total) by mouth 2 (two) times daily. 02/17/15  Yes Albertine Grates, MD  losartan (COZAAR) 25 MG tablet TAKE 1 TABLET DAILY FOR BLOOD PRESSURE 07/19/15  Yes [name] L Reed, DO  memantine (NAMENDA) 10 MG tablet TAKE 1 TABLET TWICE A DAY FOR MEMORY LOSS 05/06/15  Yes [name] L Reed, DO  metoprolol tartrate (LOPRESSOR) 25 MG tablet Take one tablet by mouth twice daily to control pulse Patient taking differently: Take 12.5 mg by mouth 2 (two) times daily.  04/14/15  Yes [name] L Reed, DO  nitroGLYCERIN (NITROSTAT) 0.4 MG SL tablet Place 0.4 mg under the tongue every 5 (five) minutes as needed for chest pain. Take one tablet sublingual every 5 minutes not to exceed three tablets for chest pain   Yes Historical Provider, MD  omeprazole (PRILOSEC) 20 MG capsule TAKE ONE CAPSULE BY MOUTH EVERY DAY FOR REFLUX 06/14/15  Yes [name] L Reed, DO  oxyCODONE (OXY IR/ROXICODONE) 5 MG immediate release tablet Take 0.5 tablets (2.5 mg total) by mouth 3 (three) times daily as needed for severe pain. 07/05/15  Yes [name] L Reed, DO  rosuvastatin (CRESTOR) 10 MG tablet Take 10 mg by mouth every 7 (seven) days.    Yes Historical Provider, MD  saccharomyces boulardii (FLORASTOR) 250 MG capsule Take 1 capsule (250 mg total) by mouth 2 (two) times daily. 06/16/15  Yes Kirt Boys, DO  warfarin (COUMADIN) 2 MG tablet Take 1 tablet (2 mg total) by mouth every other day. With  tablet 07/02/15  Yes [name] L Reed, DO  warfarin (COUMADIN) 5 MG tablet TAKE 1 TABLET EVERY DAY WITH  TO EQUAL  DAILY Patient taking differently: TAKE 1 TABLET EVERY DAY 06/18/15  Yes [name] L Reed, DO  cephALEXin (KEFLEX) 500 MG capsule Take 1 capsule (500 mg total) by mouth 4 (four) times daily. 07/23/15   Leta Baptist, MD  ciprofloxacin  (CIPRO) 500 MG tablet Take 1 tablet (500 mg total) by mouth 2 (two) times daily. Patient not taking: Reported on 07/22/2015 06/25/15   Henderson Cloud, MD  omeprazole (PRILOSEC) 20 MG capsule TAKE ONE CAPSULE BY MOUTH EVERY DAY FOR REFLUX 07/19/15   [name] L Reed, DO   BP 126/73 mmHg  Pulse 99  Temp(Src) 99 F (37.2 C) (Rectal)  Resp 17  Wt 104 lb (47.174 kg)  SpO2 98% Physical Exam  Constitutional: No distress.  thin  HENT:  Head: Normocephalic and atraumatic.  Right Ear: External ear normal.  Left Ear: External ear normal.  Mouth/Throat: Oropharynx is clear and moist.  Eyes: EOM are normal. Pupils are equal, round, and reactive to light.  Neck: Normal range of motion. Neck  supple.  Cardiovascular: Regular rhythm and intact distal pulses.  Tachycardia present.   No murmur heard. Pulmonary/Chest: Effort normal. No respiratory distress.  Abdominal: Soft. She exhibits no distension. There is no tenderness.  Musculoskeletal: She exhibits no edema or tenderness.  Neurological:  Follows commands.  Answers basic questions.  Skin: Skin is warm and dry. No rash noted. She is not diaphoretic.  Vitals reviewed.   ED Course  Procedures (including critical care time) Labs Review Labs Reviewed  COMPREHENSIVE METABOLIC PANEL - Abnormal; Notable for the following:    Potassium 5.9 (*)    Glucose, Bld 111 (*)    Total Protein 8.4 (*)    Albumin 3.3 (*)    AST 43 (*)    Total Bilirubin 1.7 (*)    All other components within normal limits  URINALYSIS, ROUTINE W REFLEX MICROSCOPIC (NOT AT [facility]) - Abnormal; Notable for the following:    APPearance TURBID (*)    Protein, ur 100 (*)    Leukocytes, UA LARGE (*)    All other components within normal limits  CBC WITH DIFFERENTIAL/PLATELET - Abnormal; Notable for the following:    WBC 10.6 (*)    Hemoglobin 11.7 (*)    HCT 35.7 (*)    RDW 15.9 (*)    All other components within normal limits  URINE MICROSCOPIC-ADD ON - Abnormal;  Notable for the following:    Bacteria, UA MANY (*)    All other components within normal limits  BASIC METABOLIC PANEL - Abnormal; Notable for the following:    Glucose, Bld 116 (*)    All other components within normal limits  CULTURE, BLOOD (ROUTINE X 2)  URINE CULTURE  CULTURE, BLOOD (ROUTINE X 2)  I-STAT CG4 LACTIC ACID, ED  I-STAT CG4 LACTIC ACID, ED    Imaging Review Dg Chest Portable 1 View  07/22/2015   CLINICAL DATA:  Altered mental status, decreased appetite  EXAM: PORTABLE CHEST - 1 VIEW  COMPARISON:  02/15/2015  FINDINGS: The heart size and mediastinal contours are within normal limits. Both lungs are clear. The visualized skeletal structures are unremarkable.  IMPRESSION: No active disease.   Electronically Signed   By: Alcide Clever M.D.   On: 07/22/2015 20:25   I have personally reviewed and evaluated these images and lab results as part of my medical decision-making.   EKG Interpretation   Date/Time:  Thursday July 22 2015 19:00:14 EDT Ventricular Rate:  101 PR Interval:  121 QRS Duration: 85 QT Interval:  345 QTC Calculation: 447 R Axis:   72 Text Interpretation:  Sinus tachycardia Probable LVH with secondary repol  abnrm Anterior ST elevation, probably due to LVH Mildly peaked t waves  compared to previous, no significant ischemic changes since previous  tracing Confirmed by NGUYEN, EMILY ([id number]) on 07/22/2015 10:03:22 PM      MDM  Patient was seen and evaluated in stable condition.  Afebrile.  Mildly tachycardic with normal blood pressure stable throughout stay.  Results revealed UTI.  Potassium elevated on initial labs but repeat normal and EKG without change in intervals.  Patient with normal renal function and no history of hyperkalemia.  Had discussed with patient's son who felt comfortable caring for patient at home and would prefer that.  Patient was given an initial dose of Rocephin IV.  She was then discharged home in stable condition with a  prescription for Keflex and instruction to follow up with her primary care physician. Final diagnoses:  UTI (lower urinary tract  infection)   1. UTI    Leta Baptist, MD 07/23/15 [phone]

## 2015-07-23 NOTE — ED Notes (Signed)
Dispatch called for [facility] transportation

## 2015-07-23 NOTE — Telephone Encounter (Signed)
[name] with [name] called and I informed her that we awaiting U/A Culture. She agreed.

## 2015-07-25 LAB — URINE CULTURE

## 2015-07-26 NOTE — Telephone Encounter (Signed)
Per Dr. Alva Garnet is sensitive to the Amoxicillin so complete as ordered. Called patient's son cell, notified.

## 2015-07-26 NOTE — Progress Notes (Signed)
ED Antimicrobial Stewardship Positive Culture Follow Up   Sabrina Long is an 79 y.o. female who presented to [facility] on 07/22/2015 with a chief complaint of  Chief Complaint  Patient presents with  . Altered Mental Status    Recent Results (from the past 720 hour(s))  Urine culture     Status: None   Collection Time: 07/22/15  7:03 PM  Result Value Ref Range Status   Specimen Description URINE, RANDOM  Final   Special Requests NONE  Final   Culture   Final    >=100,000 COLONIES/mL ENTEROCOCCUS SPECIES Performed at [facility]    Report Status 07/25/2015 FINAL  Final   Organism ID, Bacteria ENTEROCOCCUS SPECIES  Final      Susceptibility   Enterococcus species - MIC*    AMPICILLIN <=2 SENSITIVE Sensitive     LEVOFLOXACIN 2 SENSITIVE Sensitive     NITROFURANTOIN <=16 SENSITIVE Sensitive     VANCOMYCIN 1 SENSITIVE Sensitive     * >=100,000 COLONIES/mL ENTEROCOCCUS SPECIES  Culture, blood (routine x 2)     Status: None (Preliminary result)   Collection Time: 07/22/15  7:19 PM  Result Value Ref Range Status   Specimen Description BLOOD RIGHT FOREARM  Final   Special Requests BOTTLES DRAWN AEROBIC AND ANAEROBIC 5CC EACH  Final   Culture   Final    NO GROWTH 3 DAYS Performed at [facility]    Report Status PENDING  Incomplete  Culture, blood (routine x 2)     Status: None (Preliminary result)   Collection Time: 07/22/15  8:20 PM  Result Value Ref Range Status   Specimen Description BLOOD LEFT ANTECUBITAL  Final   Special Requests BOTTLES DRAWN AEROBIC AND ANAEROBIC 5CC  Final   Culture   Final    NO GROWTH 3 DAYS Performed at [facility]    Report Status PENDING  Incomplete     Treated with cephalexin, organism resistant to prescribed antimicrobial  Patient discharged originally without antimicrobial agent and treatment is now indicated  New antibiotic prescription: amoxicillin  po BID x 7 days  ED Provider: Lyn Records PA-C   [name] 07/26/2015, 9:08 AM Infectious Diseases Pharmacist Phone# [phone]

## 2015-07-26 NOTE — Telephone Encounter (Signed)
Received Urine Culture. In patient's chart it shows patient went to ER on 07/23/15 with Altered mental status and given Amoxicillin  bid for 7 days.  Given to Dr. Renato Gails to review and sign.

## 2015-07-26 NOTE — Telephone Encounter (Signed)
Patient daughter notified and agreed.  

## 2015-07-27 ENCOUNTER — Telehealth (HOSPITAL_COMMUNITY): Payer: Self-pay

## 2015-07-27 LAB — CULTURE, BLOOD (ROUTINE X 2)
CULTURE: NO GROWTH
Culture: NO GROWTH

## 2015-07-27 NOTE — Telephone Encounter (Signed)
Post ED Visit - Positive Culture Follow-up: Chart Hand-off to ED Flow Manager  Culture assessed and recommendations reviewed by:  Isaac Bliss, Pharm.D., BCPS  Celedonio Miyamoto, Pharm.D., BCPS-AQ ID  Georgina Pillion, Pharm.D., BCPS  [place], [address].D., BCPS, AAHIVP  Estella Husk, Pharm .D., BCPS, AAHIVP  Tennis Must, Pharm.D.  Casilda Carls, Pharm.D.  Positive urine culture   Patient discharged without antimicrobial prescription and treatment is now indicated  Organism is resistant to prescribed ED discharge antimicrobial  Patient with positive blood cultures  Changes discussed with ED provider: Chauncey Reading New antibiotic prescription stop cephalexin. Start amoxicillin  po bid x 7 days  Attempting to contact  Ashley Jacobs 07/27/2015, 7:23 AM

## 2015-07-28 ENCOUNTER — Telehealth: Payer: Self-pay | Admitting: *Deleted

## 2015-07-28 NOTE — Telephone Encounter (Signed)
Sabrina Long called with PT/INR results : 27.9 / 2.3 Patient is currently taking 5 mg / 7 mg every other day x 2 weeks.  Called  Dr Renato Gails on her cell phone with the results, she stated that patient is therapeutic now and they can stay a current dose and recheck in 1 week.  Called Sabrina Long at 11:48 am to give her the instructions from Dr. Allyne Gee stated that she understood the instructions.

## 2015-08-04 ENCOUNTER — Telehealth: Payer: Self-pay

## 2015-08-04 LAB — POCT INR: INR: 3.1 — AB (ref 0.9–1.1)

## 2015-08-04 NOTE — Telephone Encounter (Signed)
I called Sabrina Long again for I had not heard back form him nor [name]. Patient is taking 7 mg alternating with 5 mg. Yesterday patient had a 7 mg. Patient has not missed any doses Please advise

## 2015-08-04 NOTE — Telephone Encounter (Signed)
Message left on triage VM- Current PT/INR 3.1/37.5  I called [name] with [name] to confirm current dose of coumadin and question if patient missed any. I left message on voicemail for Herbert SetaHeather to return call.  I left message for [name] as well hoping to reach him to confirm current dose

## 2015-08-05 NOTE — Telephone Encounter (Signed)
Discussed with patient's son, [name] verbalized understanding of results. I left message on voicemail for [name] with instructions.

## 2015-08-05 NOTE — Telephone Encounter (Signed)
Decrease coumadin to 5mg  all days except Mon and Wed when she'll get 7mg .

## 2015-08-11 ENCOUNTER — Telehealth: Payer: Self-pay | Admitting: *Deleted

## 2015-08-11 ENCOUNTER — Other Ambulatory Visit: Payer: Self-pay | Admitting: Internal Medicine

## 2015-08-11 NOTE — Telephone Encounter (Signed)
[facility] patient PT/INR =2.3, this result will be given to Dr, Chilton SiGreen

## 2015-08-11 NOTE — Telephone Encounter (Signed)
Spoke with Sabrina Long regarding PT/INR , informed her that Dr. Chilton SiGreen wants her to continue current dosing and repeat in 2 weeks.  Spoke to patient's son(Sabrina Long) regarding the instructions above and he stated that he understood.

## 2015-08-13 ENCOUNTER — Encounter (HOSPITAL_BASED_OUTPATIENT_CLINIC_OR_DEPARTMENT_OTHER): Payer: Medicare Other | Attending: Internal Medicine

## 2015-08-13 DIAGNOSIS — X58XXXA Exposure to other specified factors, initial encounter: Secondary | ICD-10-CM | POA: Diagnosis not present

## 2015-08-13 DIAGNOSIS — S91104A Unspecified open wound of right lesser toe(s) without damage to nail, initial encounter: Secondary | ICD-10-CM | POA: Diagnosis not present

## 2015-08-13 DIAGNOSIS — I252 Old myocardial infarction: Secondary | ICD-10-CM | POA: Diagnosis not present

## 2015-08-13 DIAGNOSIS — I10 Essential (primary) hypertension: Secondary | ICD-10-CM | POA: Diagnosis not present

## 2015-08-13 DIAGNOSIS — M1990 Unspecified osteoarthritis, unspecified site: Secondary | ICD-10-CM | POA: Insufficient documentation

## 2015-08-13 DIAGNOSIS — L98499 Non-pressure chronic ulcer of skin of other sites with unspecified severity: Secondary | ICD-10-CM | POA: Insufficient documentation

## 2015-08-13 DIAGNOSIS — L89154 Pressure ulcer of sacral region, stage 4: Secondary | ICD-10-CM | POA: Insufficient documentation

## 2015-08-13 DIAGNOSIS — Z86718 Personal history of other venous thrombosis and embolism: Secondary | ICD-10-CM | POA: Diagnosis not present

## 2015-08-13 DIAGNOSIS — G309 Alzheimer's disease, unspecified: Secondary | ICD-10-CM | POA: Diagnosis not present

## 2015-08-13 DIAGNOSIS — G40909 Epilepsy, unspecified, not intractable, without status epilepticus: Secondary | ICD-10-CM | POA: Diagnosis not present

## 2015-08-13 DIAGNOSIS — F0280 Dementia in other diseases classified elsewhere without behavioral disturbance: Secondary | ICD-10-CM | POA: Insufficient documentation

## 2015-08-13 DIAGNOSIS — G8250 Quadriplegia, unspecified: Secondary | ICD-10-CM | POA: Insufficient documentation

## 2015-08-13 DIAGNOSIS — L89224 Pressure ulcer of left hip, stage 4: Secondary | ICD-10-CM | POA: Diagnosis not present

## 2015-08-16 ENCOUNTER — Telehealth: Payer: Self-pay | Admitting: *Deleted

## 2015-08-16 NOTE — Telephone Encounter (Signed)
[name] with [name] called and wanted a verbal order for U/A and Cx on patient. Stated that she is running a on and off fever and urine has a foul smell and sedimentation. Verbal order given.

## 2015-08-17 ENCOUNTER — Other Ambulatory Visit: Payer: Self-pay | Admitting: *Deleted

## 2015-08-17 MED ORDER — OXYCODONE HCL 5 MG PO TABS: 2.5000 mg | ORAL_TABLET | Freq: Three times a day (TID) | ORAL | Status: DC | PRN

## 2015-08-17 NOTE — Telephone Encounter (Signed)
Patient son, [name] requested and will pick up

## 2015-08-18 ENCOUNTER — Ambulatory Visit: Payer: Medicare Other | Admitting: Podiatry

## 2015-08-18 DIAGNOSIS — L89153 Pressure ulcer of sacral region, stage 3: Secondary | ICD-10-CM

## 2015-08-18 DIAGNOSIS — G309 Alzheimer's disease, unspecified: Secondary | ICD-10-CM

## 2015-08-18 DIAGNOSIS — S91205A Unspecified open wound of left lesser toe(s) with damage to nail, initial encounter: Secondary | ICD-10-CM

## 2015-08-18 DIAGNOSIS — L89223 Pressure ulcer of left hip, stage 3: Secondary | ICD-10-CM

## 2015-08-20 ENCOUNTER — Encounter: Payer: Self-pay | Admitting: Podiatry

## 2015-08-20 ENCOUNTER — Telehealth: Payer: Self-pay

## 2015-08-20 ENCOUNTER — Ambulatory Visit (INDEPENDENT_AMBULATORY_CARE_PROVIDER_SITE_OTHER): Payer: Medicare Other | Admitting: Podiatry

## 2015-08-20 DIAGNOSIS — B351 Tinea unguium: Secondary | ICD-10-CM | POA: Diagnosis not present

## 2015-08-20 DIAGNOSIS — M79676 Pain in unspecified toe(s): Secondary | ICD-10-CM | POA: Diagnosis not present

## 2015-08-20 DIAGNOSIS — I739 Peripheral vascular disease, unspecified: Secondary | ICD-10-CM

## 2015-08-20 NOTE — Progress Notes (Signed)
Patient ID: Sabrina Long, female   DOB: 1936-07-05, 79 y.o.   MRN: [id number] HPI  Complaint:  Visit Type: Patient returns to my office for continued preventative foot care services. Complaint: Patient states" my nails have grown long and thick and become painful to walk and wear shoes" . She presents for preventative foot care services. No changes to ROS.  Son says she is under care for wound third toe right foot by the wound center.  Podiatric Exam: Vascular: dorsalis pedis and posterior tibial pulses are negative. Capillary return is immediate. Temperature gradient is negative. Skin turgor WNL,   Sensorium: Normal Semmes Weinstein monofilament test. Normal tactile sensation bilaterally.  Nail Exam: Pt has thick disfigured discolored nails with subungual debris noted bilateral entire nail hallux through fifth toenails Ulcer Exam: There is no evidence of ulcer or pre-ulcerative changes or infection. Orthopedic Exam: Muscle tone and strength are WNL. No limitations in general ROM. No crepitus or effusions noted. Foot type and digits show no abnormalities. Bony prominences are unremarkable. Skin: No Porokeratosis. No infection or ulcers.  There is absence of nail both third and fourth toes right foot at site of nails.  No evidence of infection noted. Diagnosis:  Tinea unguium, Pain in right toe, pain in left toes  Treatment & Plan Procedures and Treatment: Consent by patient was obtained for treatment procedures. The patient understood the discussion of treatment and procedures well. All questions were answered thoroughly reviewed. Debridement of mycotic and hypertrophic toenails, 1 through 5 bilateral and clearing of subungual debris. No ulceration, no infection noted.  Return Visit-Office Procedure: Patient instructed to return to the office for a follow up visit 3 months for continued evaluation and treatment.

## 2015-08-20 NOTE — Telephone Encounter (Signed)
Per Dr.Reed- encourage fluid intake, contaminated urine sample. Report sent to scanning    Discussed with Starling MannsBrian, [name] verbalized understanding of results

## 2015-08-27 ENCOUNTER — Telehealth: Payer: Self-pay | Admitting: *Deleted

## 2015-08-27 LAB — POCT INR: INR: 2.1 — AB (ref 0.9–1.1)

## 2015-08-27 NOTE — Telephone Encounter (Signed)
[name] with [name] called with Protime Results 08/27/2015  INR:  2.1  Current Dose of Coumadin: 7mg  on Monday and Wednesday and 5mg  all other days Per Dr. Reed--Continue same dose of Coumadin and recheck in 2 weeks. [name] and agreed.

## 2015-09-08 ENCOUNTER — Telehealth: Payer: Self-pay | Admitting: *Deleted

## 2015-09-08 LAB — POCT INR: INR: 2.4 — AB (ref 0.9–1.1)

## 2015-09-08 NOTE — Telephone Encounter (Signed)
Per Dr. Dulce SellarGreen---Continue same dose of Coumadin and repeat INR in 1 month.  [name] Not[facility] and agreed and will notify caregiver.

## 2015-09-08 NOTE — Telephone Encounter (Signed)
[name] with [name] called with Protime Results 09/08/15  INR:  2.4  Current Dose of Coumadin: 7mg  Monday and Wednesday and 5mg  all other days Printed and given to Dr. Chilton SiGreen to review and sign.

## 2015-09-09 ENCOUNTER — Other Ambulatory Visit: Payer: Self-pay | Admitting: Internal Medicine

## 2015-09-10 ENCOUNTER — Ambulatory Visit (HOSPITAL_COMMUNITY)
Admission: RE | Admit: 2015-09-10 | Discharge: 2015-09-10 | Disposition: A | Discharge status: Home / Self Care | Payer: Medicare Other | Source: Ambulatory Visit | Attending: Internal Medicine | Admitting: Internal Medicine

## 2015-09-10 ENCOUNTER — Encounter: Payer: Self-pay | Admitting: Internal Medicine

## 2015-09-10 ENCOUNTER — Encounter (HOSPITAL_BASED_OUTPATIENT_CLINIC_OR_DEPARTMENT_OTHER): Payer: Medicare Other | Attending: Internal Medicine

## 2015-09-10 ENCOUNTER — Other Ambulatory Visit: Payer: Self-pay | Admitting: Internal Medicine

## 2015-09-10 DIAGNOSIS — L89154 Pressure ulcer of sacral region, stage 4: Secondary | ICD-10-CM | POA: Insufficient documentation

## 2015-09-10 DIAGNOSIS — I252 Old myocardial infarction: Secondary | ICD-10-CM | POA: Insufficient documentation

## 2015-09-10 DIAGNOSIS — G8250 Quadriplegia, unspecified: Secondary | ICD-10-CM | POA: Insufficient documentation

## 2015-09-10 DIAGNOSIS — L98491 Non-pressure chronic ulcer of skin of other sites limited to breakdown of skin: Secondary | ICD-10-CM | POA: Diagnosis not present

## 2015-09-10 DIAGNOSIS — G309 Alzheimer's disease, unspecified: Secondary | ICD-10-CM | POA: Insufficient documentation

## 2015-09-10 DIAGNOSIS — X58XXXA Exposure to other specified factors, initial encounter: Secondary | ICD-10-CM | POA: Insufficient documentation

## 2015-09-10 DIAGNOSIS — M1990 Unspecified osteoarthritis, unspecified site: Secondary | ICD-10-CM | POA: Insufficient documentation

## 2015-09-10 DIAGNOSIS — S6992XA Unspecified injury of left wrist, hand and finger(s), initial encounter: Secondary | ICD-10-CM

## 2015-09-10 DIAGNOSIS — F0280 Dementia in other diseases classified elsewhere without behavioral disturbance: Secondary | ICD-10-CM | POA: Insufficient documentation

## 2015-09-10 DIAGNOSIS — L89224 Pressure ulcer of left hip, stage 4: Secondary | ICD-10-CM | POA: Insufficient documentation

## 2015-09-10 DIAGNOSIS — Z86718 Personal history of other venous thrombosis and embolism: Secondary | ICD-10-CM | POA: Insufficient documentation

## 2015-09-10 DIAGNOSIS — I10 Essential (primary) hypertension: Secondary | ICD-10-CM | POA: Insufficient documentation

## 2015-09-10 DIAGNOSIS — M1812 Unilateral primary osteoarthritis of first carpometacarpal joint, left hand: Secondary | ICD-10-CM | POA: Diagnosis not present

## 2015-09-20 ENCOUNTER — Other Ambulatory Visit: Payer: Self-pay | Admitting: *Deleted

## 2015-09-20 MED ORDER — OXYCODONE HCL 5 MG PO TABS: 2.5000 mg | ORAL_TABLET | Freq: Three times a day (TID) | ORAL | Status: DC | PRN

## 2015-09-20 NOTE — Telephone Encounter (Signed)
[name], son requested and will pick up 

## 2015-09-21 ENCOUNTER — Other Ambulatory Visit: Payer: Self-pay | Admitting: Internal Medicine

## 2015-09-23 ENCOUNTER — Other Ambulatory Visit: Payer: Self-pay | Admitting: *Deleted

## 2015-09-23 MED ORDER — ROSUVASTATIN CALCIUM 10 MG PO TABS: ORAL_TABLET | ORAL | Status: DC

## 2015-09-23 NOTE — Telephone Encounter (Signed)
Patient son, Sabrina Long requested refill to be sent to [facility]  And confirmed one tablet by mouth once daily. Also son wants Rx sent to local pharm due to patient be out of.

## 2015-09-27 ENCOUNTER — Telehealth: Payer: Self-pay | Admitting: *Deleted

## 2015-09-27 MED ORDER — PRAVASTATIN SODIUM 80 MG PO TABS: ORAL_TABLET | ORAL | Status: DC

## 2015-09-27 NOTE — Telephone Encounter (Signed)
Per Dr. Meriel Picaeed--Pravastatin 80mg  Take one tablet by mouth at bedtime #[id number]. Faxed to [facility] [phone] and patient son, [name] Notified and agreed. Faxed Rx to local pharmacy also.

## 2015-09-27 NOTE — Addendum Note (Signed)
Addended by: Nelda SevereMAY, ANITA A on: 09/27/2015 12:50 PM   Modules accepted: Orders, Medications

## 2015-09-27 NOTE — Telephone Encounter (Signed)
Received fax from [facility] #[phone] stating Crestor is a non preferred medication through [employer]. Would prefer alternative such as: Atorvastatin, Fluvastatin, Lovastatin, Pravastatin, Simvastatin. To be faxed back to [facility] fax#: [phone]. Case #: [id number] Member #: [id number] Given to Dr. Renato Gailseed to review and sign.

## 2015-09-28 ENCOUNTER — Other Ambulatory Visit: Payer: Self-pay | Admitting: *Deleted

## 2015-09-28 MED ORDER — PRAVASTATIN SODIUM 80 MG PO TABS: ORAL_TABLET | ORAL | Status: DC

## 2015-09-28 NOTE — Telephone Encounter (Signed)
Patient son, Sabrina Long called and stated that pharmacy did not receive the Rx. Refaxed to pharmacy

## 2015-09-29 ENCOUNTER — Telehealth: Payer: Self-pay | Admitting: *Deleted

## 2015-09-29 MED ORDER — SACCHAROMYCES BOULARDII 250 MG PO CAPS: 250.0000 mg | ORAL_CAPSULE | Freq: Two times a day (BID) | ORAL | Status: DC

## 2015-09-29 MED ORDER — CIPROFLOXACIN HCL 250 MG PO TABS: ORAL_TABLET | ORAL | Status: DC

## 2015-09-29 NOTE — Telephone Encounter (Signed)
Patient son, [name] notified and Rx's faxed to pharmacy.

## 2015-09-29 NOTE — Telephone Encounter (Signed)
Per Dr. Rogelia Bogaarter--Cipro 250mg  po bid x 3days. Florastor po bid x 7 days

## 2015-09-29 NOTE — Telephone Encounter (Signed)
[facility] and placed in Dr. Lesle Chriseed's Folder. [facility] retrieved from Dr. Ernest Mallickeed's folder and placed in Dr. Celene Skeenarter's to review and sign due to appears to be Positive: Proteus Mirabilis.

## 2015-09-29 NOTE — Addendum Note (Signed)
Addended by: Nelda SevereMAY, Aadyn Buchheit A on: 09/29/2015 04:42 PM   Modules accepted: Orders, Medications

## 2015-10-07 ENCOUNTER — Encounter (HOSPITAL_BASED_OUTPATIENT_CLINIC_OR_DEPARTMENT_OTHER): Payer: Medicare Other | Attending: Internal Medicine

## 2015-10-07 DIAGNOSIS — M1990 Unspecified osteoarthritis, unspecified site: Secondary | ICD-10-CM | POA: Insufficient documentation

## 2015-10-07 DIAGNOSIS — Z86718 Personal history of other venous thrombosis and embolism: Secondary | ICD-10-CM | POA: Insufficient documentation

## 2015-10-07 DIAGNOSIS — G309 Alzheimer's disease, unspecified: Secondary | ICD-10-CM | POA: Insufficient documentation

## 2015-10-07 DIAGNOSIS — I10 Essential (primary) hypertension: Secondary | ICD-10-CM | POA: Insufficient documentation

## 2015-10-07 DIAGNOSIS — L89154 Pressure ulcer of sacral region, stage 4: Secondary | ICD-10-CM | POA: Insufficient documentation

## 2015-10-07 DIAGNOSIS — G8250 Quadriplegia, unspecified: Secondary | ICD-10-CM | POA: Insufficient documentation

## 2015-10-07 DIAGNOSIS — L89224 Pressure ulcer of left hip, stage 4: Secondary | ICD-10-CM | POA: Insufficient documentation

## 2015-10-07 DIAGNOSIS — F0280 Dementia in other diseases classified elsewhere without behavioral disturbance: Secondary | ICD-10-CM | POA: Insufficient documentation

## 2015-10-11 ENCOUNTER — Telehealth: Payer: Self-pay | Admitting: *Deleted

## 2015-10-11 NOTE — Telephone Encounter (Signed)
Sabrina [facility] with [name] called and stated that patient's son did not increase patient's Coumadin to 7 mg. Stated that he will start giving her 7mg  starting today. [facility] would like to know when you would like the INR drawn since he did not start the the dosage. She was scheduled to go out on Thursday. Please Advise.

## 2015-10-12 ENCOUNTER — Other Ambulatory Visit: Payer: Self-pay | Admitting: Internal Medicine

## 2015-10-12 ENCOUNTER — Other Ambulatory Visit: Payer: Self-pay | Admitting: *Deleted

## 2015-10-12 MED ORDER — HYDRALAZINE HCL 50 MG PO TABS: 50.0000 mg | ORAL_TABLET | Freq: Three times a day (TID) | ORAL | Status: DC

## 2015-10-12 NOTE — Telephone Encounter (Signed)
Called and spoke with Herbert SetaHeather. Son was still doing 5mg  alternating 7mg  daily. Haven't missed a dose. Nurse will go out Thursday morning and check INR.

## 2015-10-12 NOTE — Telephone Encounter (Signed)
Let's still check it Thursday b/c I don't want it to be called to someone unfamiliar over the holiday weekend.  I will just expect it to still be subtherapeutic.  What dosage was he giving?

## 2015-10-12 NOTE — Telephone Encounter (Signed)
Patient son requested Rx to be faxed to local pharmacy until they can get Rx from [facility].

## 2015-10-12 NOTE — Telephone Encounter (Signed)
[facility]

## 2015-10-14 NOTE — Telephone Encounter (Signed)
Continue coumadin 7 mg daily and recheck INR on Tuesday.

## 2015-10-14 NOTE — Telephone Encounter (Signed)
[name] at [phone]  [facility].  Stated  Patient started taking the 7 mg on Monday, January 19th. Her INR is 1.8 and her PT is 21.1.   Need intructions- should she continue taking 7 mg daily? When should [facility] check the INR / PT again.  Need intructions. Aram Beechamynthia

## 2015-10-17 DIAGNOSIS — F0280 Dementia in other diseases classified elsewhere without behavioral disturbance: Secondary | ICD-10-CM

## 2015-10-17 DIAGNOSIS — L89154 Pressure ulcer of sacral region, stage 4: Secondary | ICD-10-CM

## 2015-10-17 DIAGNOSIS — L89224 Pressure ulcer of left hip, stage 4: Secondary | ICD-10-CM

## 2015-10-17 DIAGNOSIS — G309 Alzheimer's disease, unspecified: Secondary | ICD-10-CM

## 2015-10-20 ENCOUNTER — Telehealth: Payer: Self-pay | Admitting: *Deleted

## 2015-10-20 NOTE — Telephone Encounter (Signed)
Sabrina with [name] called with Protime Results 10/20/2015  INR: 2.7  Current Dose of Coumadin: 7mg  once daily Printed and given to Dr. Chilton SiGreen to review

## 2015-10-20 NOTE — Telephone Encounter (Signed)
Per Dr. Dulce SellarGreen--Continue Current Dose of Coumadin and recheck in 4 weeks. [name] Notified.

## 2015-10-22 DIAGNOSIS — G309 Alzheimer's disease, unspecified: Secondary | ICD-10-CM | POA: Diagnosis not present

## 2015-10-22 DIAGNOSIS — M1990 Unspecified osteoarthritis, unspecified site: Secondary | ICD-10-CM | POA: Diagnosis not present

## 2015-10-22 DIAGNOSIS — L89224 Pressure ulcer of left hip, stage 4: Secondary | ICD-10-CM | POA: Diagnosis not present

## 2015-10-22 DIAGNOSIS — L89154 Pressure ulcer of sacral region, stage 4: Secondary | ICD-10-CM | POA: Diagnosis not present

## 2015-10-22 DIAGNOSIS — Z86718 Personal history of other venous thrombosis and embolism: Secondary | ICD-10-CM | POA: Diagnosis not present

## 2015-10-22 DIAGNOSIS — I10 Essential (primary) hypertension: Secondary | ICD-10-CM | POA: Diagnosis not present

## 2015-10-22 DIAGNOSIS — F0280 Dementia in other diseases classified elsewhere without behavioral disturbance: Secondary | ICD-10-CM | POA: Diagnosis not present

## 2015-10-22 DIAGNOSIS — G8250 Quadriplegia, unspecified: Secondary | ICD-10-CM | POA: Diagnosis not present

## 2015-10-27 ENCOUNTER — Other Ambulatory Visit: Payer: Self-pay | Admitting: Internal Medicine

## 2015-10-27 ENCOUNTER — Other Ambulatory Visit: Payer: Self-pay | Admitting: *Deleted

## 2015-10-27 MED ORDER — OXYCODONE HCL 5 MG PO TABS: 2.5000 mg | ORAL_TABLET | Freq: Three times a day (TID) | ORAL | Status: DC | PRN

## 2015-10-27 NOTE — Telephone Encounter (Signed)
[name], son requested and will pick up 

## 2015-10-28 ENCOUNTER — Telehealth: Payer: Self-pay | Admitting: *Deleted

## 2015-10-28 NOTE — Telephone Encounter (Signed)
[name] called and stated that she needed verbal orders for patient to replace her Foley Cath due to patient pulling it out. Verbal orders given.

## 2015-11-04 ENCOUNTER — Other Ambulatory Visit: Payer: Self-pay | Admitting: Internal Medicine

## 2015-11-08 ENCOUNTER — Other Ambulatory Visit: Payer: Self-pay | Admitting: Internal Medicine

## 2015-11-09 ENCOUNTER — Other Ambulatory Visit: Payer: Self-pay | Admitting: Internal Medicine

## 2015-11-11 ENCOUNTER — Telehealth: Payer: Self-pay | Admitting: *Deleted

## 2015-11-11 NOTE — Telephone Encounter (Signed)
Sabrina Long with [name] called with Protime Results   INR: 3.0  Current Dose of Coumadin:  once daily Per Jessica--Continue same dose of Coumadin and recheck 1 week Sabrina Long Notified and agreed.

## 2015-11-15 ENCOUNTER — Telehealth: Payer: Self-pay | Admitting: *Deleted

## 2015-11-15 NOTE — Telephone Encounter (Signed)
Yes, may increase french on catheter.

## 2015-11-15 NOTE — Telephone Encounter (Signed)
Sabrina Long with Sabrina Long called and stated that patient's Cath. Has been changed multiple times due to leaking and coming out. Nurse wants to know if she can go up on the cath size and put in a [address] [place] with 30cc balloon. Please Advise.

## 2015-11-15 NOTE — Telephone Encounter (Signed)
Per Dr. Reed--Hold Coumadin today then resume  daily. Recheck in 1 week. [name] with [facility] notified and agreed and will contact family.

## 2015-11-15 NOTE — Telephone Encounter (Signed)
Sabrina Long with [name] called with INR Results  INR:  3.4 Previous 3.0  Current Dose of Coumadin is 7 mg daily. Printed and given to Dr. Renato Gails to review and sign.

## 2015-11-15 NOTE — Telephone Encounter (Signed)
Pyridium  po daily prn bladder spasms

## 2015-11-15 NOTE — Telephone Encounter (Signed)
[name] Notified and will change Cath. [name], nurse, also stated that she wonders if patient is having bladder spasms also. Should we prescribe something for this just to see also if that is causing the cath. To come out? Please Advise.

## 2015-11-16 MED ORDER — PHENAZOPYRIDINE HCL 200 MG PO TABS: ORAL_TABLET | ORAL | Status: DC

## 2015-11-16 NOTE — Telephone Encounter (Signed)
[name] Notified. Updated medication list

## 2015-11-17 ENCOUNTER — Telehealth: Payer: Self-pay

## 2015-11-17 NOTE — Telephone Encounter (Signed)
[name] a nurse from [facility] called to say that the patient is still having trouble with bladder she says she thinks it is due to bladder spasms and not to her catheter she  would like medication for this. Please Advise

## 2015-11-17 NOTE — Telephone Encounter (Signed)
I had written an order for pyridium when I got the original message requesting medication for bladder spasms.  Let's do Pyridium  po bid for 7 days.

## 2015-11-17 NOTE — Telephone Encounter (Signed)
Called and spoke with Herbert Seta about the medication. Tried to order it but it only comes in 200 mg, and it is already on patients med list for 200 mg Please Advise

## 2015-11-18 NOTE — Telephone Encounter (Signed)
Left message on machine for nurset to return call when available

## 2015-11-18 NOTE — Telephone Encounter (Signed)
Left a message with Herbert Seta to call the office on Friday, 11-19-15

## 2015-11-18 NOTE — Telephone Encounter (Signed)
Sounds like we need to confirm that they got her the  instead.  It will make her urine orange.

## 2015-11-19 ENCOUNTER — Ambulatory Visit: Payer: Medicare Other | Admitting: Podiatry

## 2015-11-19 ENCOUNTER — Encounter (HOSPITAL_BASED_OUTPATIENT_CLINIC_OR_DEPARTMENT_OTHER): Payer: Medicare Other | Attending: Internal Medicine

## 2015-11-19 DIAGNOSIS — G309 Alzheimer's disease, unspecified: Secondary | ICD-10-CM | POA: Insufficient documentation

## 2015-11-19 DIAGNOSIS — L89224 Pressure ulcer of left hip, stage 4: Secondary | ICD-10-CM | POA: Diagnosis not present

## 2015-11-19 DIAGNOSIS — Z86718 Personal history of other venous thrombosis and embolism: Secondary | ICD-10-CM | POA: Insufficient documentation

## 2015-11-19 DIAGNOSIS — I10 Essential (primary) hypertension: Secondary | ICD-10-CM | POA: Insufficient documentation

## 2015-11-19 DIAGNOSIS — L89154 Pressure ulcer of sacral region, stage 4: Secondary | ICD-10-CM | POA: Insufficient documentation

## 2015-11-19 DIAGNOSIS — G40909 Epilepsy, unspecified, not intractable, without status epilepticus: Secondary | ICD-10-CM | POA: Insufficient documentation

## 2015-11-19 DIAGNOSIS — G8250 Quadriplegia, unspecified: Secondary | ICD-10-CM | POA: Insufficient documentation

## 2015-11-19 DIAGNOSIS — F0280 Dementia in other diseases classified elsewhere without behavioral disturbance: Secondary | ICD-10-CM | POA: Diagnosis not present

## 2015-11-19 DIAGNOSIS — M1990 Unspecified osteoarthritis, unspecified site: Secondary | ICD-10-CM | POA: Insufficient documentation

## 2015-11-19 DIAGNOSIS — F0390 Unspecified dementia without behavioral disturbance: Secondary | ICD-10-CM | POA: Insufficient documentation

## 2015-11-19 DIAGNOSIS — I252 Old myocardial infarction: Secondary | ICD-10-CM | POA: Insufficient documentation

## 2015-11-19 NOTE — Telephone Encounter (Signed)
Called spoke with Herbert Seta said she would check medications today and call the office back.

## 2015-11-22 ENCOUNTER — Telehealth: Payer: Self-pay | Admitting: *Deleted

## 2015-11-22 NOTE — Telephone Encounter (Signed)
[name] with [name] called with [facility] Results  INR:  2.9  Current Dose of Coumadin :  daily Printed and given to Dr. Renato Gails to review.

## 2015-11-22 NOTE — Telephone Encounter (Signed)
Per Dr. Adair Laundry to Coumadin  alternating with  and recheck INR in 1 week.  Herbert Seta will notify family

## 2015-11-24 ENCOUNTER — Ambulatory Visit (INDEPENDENT_AMBULATORY_CARE_PROVIDER_SITE_OTHER): Payer: Medicare Other | Admitting: Podiatry

## 2015-11-24 ENCOUNTER — Encounter: Payer: Self-pay | Admitting: Podiatry

## 2015-11-24 DIAGNOSIS — M79676 Pain in unspecified toe(s): Secondary | ICD-10-CM | POA: Diagnosis not present

## 2015-11-24 DIAGNOSIS — B351 Tinea unguium: Secondary | ICD-10-CM

## 2015-11-24 DIAGNOSIS — I739 Peripheral vascular disease, unspecified: Secondary | ICD-10-CM

## 2015-11-24 NOTE — Progress Notes (Signed)
Patient ID: Sabrina Long, female   DOB: 05-13-1936, 80 y.o.   MRN: [id number] HPI  Complaint:  Visit Type: Patient returns to my office for continued preventative foot care services. Complaint: Patient states" my nails have grown long and thick and become painful to walk and wear shoes" . She presents for preventative foot care services. No changes to ROS.  Son says she is under care for wound third toe right foot by the wound center.  Podiatric Exam: Vascular: dorsalis pedis and posterior tibial pulses are negative. Capillary return is immediate. Temperature gradient is negative. Skin turgor WNL,   Sensorium: Normal Semmes Weinstein monofilament test. Normal tactile sensation bilaterally.  Nail Exam: Pt has thick disfigured discolored nails with subungual debris noted bilateral entire nail hallux through fifth toenails Ulcer Exam: There is no evidence of ulcer or pre-ulcerative changes or infection. Orthopedic Exam: Muscle tone and strength are WNL. No limitations in general ROM. No crepitus or effusions noted. Foot type and digits show no abnormalities. Bony prominences are unremarkable. Skin: No Porokeratosis. No infection or ulcers.  .  No evidence of infection noted. Diagnosis:  Tinea unguium, Pain in right toe, pain in left toes  Treatment & Plan Procedures and Treatment: Consent by patient was obtained for treatment procedures. The patient understood the discussion of treatment and procedures well. All questions were answered thoroughly reviewed. Debridement of mycotic and hypertrophic toenails, 1 through 5 bilateral and clearing of subungual debris. No ulceration, no infection noted. Her fourth toenail right toenail was loose and unattached to nail bed. It was dressed with neosporin/DSD. Her third toe has healed and closed.  No drainage. Return Visit-Office Procedure: Patient instructed to return to the office for a follow up visit 3 months for continued evaluation and  treatment.   Helane Gunther DPM   Helane Gunther DPM

## 2015-11-29 ENCOUNTER — Telehealth: Payer: Self-pay | Admitting: *Deleted

## 2015-11-29 NOTE — Telephone Encounter (Signed)
Sabrina [facility] with [name] called with INR Results  INR:  3.8  Current Dose of Coumadin:  alternating . No diet changes. No antibiotic. And medication given as prescribed.  Printed and given to Dr. Renato Gails to review and sign.

## 2015-11-29 NOTE — Telephone Encounter (Signed)
Per Dr. Reed--Hold Coumadin today. Decrease Coumadin to  all days except  Monday and Friday. Recheck INR in 1 week.  [name] with [name] notified and she will notify family.

## 2015-12-03 ENCOUNTER — Telehealth: Payer: Self-pay | Admitting: *Deleted

## 2015-12-03 NOTE — Telephone Encounter (Signed)
Per Arlys John, son, Patient does not have a Insurance underwriter and want Dr. Renato Gails to address. Forwarded to Dr. Renato Gails.

## 2015-12-03 NOTE — Telephone Encounter (Signed)
Patient son, Sabrina Long, called and stated that his mother is urinating everywhere and needs a bladder medication. Patient has a cath but having spasms and the urine is still getting around it. Would like bladder medication to help with the spasms. Please Advise.

## 2015-12-03 NOTE — Telephone Encounter (Signed)
Recommend he contacts her urologist for medication

## 2015-12-05 ENCOUNTER — Encounter (HOSPITAL_COMMUNITY): Payer: Self-pay

## 2015-12-05 ENCOUNTER — Inpatient Hospital Stay (HOSPITAL_COMMUNITY)
Admission: EM | Admit: 2015-12-05 | Discharge: 2015-12-13 | DRG: 871 | Disposition: A | Discharge status: Home / Self Care | Payer: Medicare Other | Attending: Internal Medicine | Admitting: Internal Medicine

## 2015-12-05 ENCOUNTER — Emergency Department (HOSPITAL_COMMUNITY): Payer: Medicare Other

## 2015-12-05 DIAGNOSIS — A419 Sepsis, unspecified organism: Principal | ICD-10-CM

## 2015-12-05 DIAGNOSIS — Z86711 Personal history of pulmonary embolism: Secondary | ICD-10-CM

## 2015-12-05 DIAGNOSIS — R319 Hematuria, unspecified: Secondary | ICD-10-CM | POA: Diagnosis present

## 2015-12-05 DIAGNOSIS — E43 Unspecified severe protein-calorie malnutrition: Secondary | ICD-10-CM | POA: Diagnosis present

## 2015-12-05 DIAGNOSIS — F0281 Dementia in other diseases classified elsewhere with behavioral disturbance: Secondary | ICD-10-CM | POA: Diagnosis present

## 2015-12-05 DIAGNOSIS — L8990 Pressure ulcer of unspecified site, unspecified stage: Secondary | ICD-10-CM | POA: Diagnosis present

## 2015-12-05 DIAGNOSIS — F0391 Unspecified dementia with behavioral disturbance: Secondary | ICD-10-CM

## 2015-12-05 DIAGNOSIS — D638 Anemia in other chronic diseases classified elsewhere: Secondary | ICD-10-CM | POA: Diagnosis present

## 2015-12-05 DIAGNOSIS — D72829 Elevated white blood cell count, unspecified: Secondary | ICD-10-CM

## 2015-12-05 DIAGNOSIS — F0280 Dementia in other diseases classified elsewhere without behavioral disturbance: Secondary | ICD-10-CM

## 2015-12-05 DIAGNOSIS — J690 Pneumonitis due to inhalation of food and vomit: Secondary | ICD-10-CM | POA: Diagnosis present

## 2015-12-05 DIAGNOSIS — E876 Hypokalemia: Secondary | ICD-10-CM | POA: Diagnosis present

## 2015-12-05 DIAGNOSIS — L89153 Pressure ulcer of sacral region, stage 3: Secondary | ICD-10-CM | POA: Diagnosis present

## 2015-12-05 DIAGNOSIS — N179 Acute kidney failure, unspecified: Secondary | ICD-10-CM | POA: Diagnosis present

## 2015-12-05 DIAGNOSIS — B9620 Unspecified Escherichia coli [E. coli] as the cause of diseases classified elsewhere: Secondary | ICD-10-CM | POA: Diagnosis present

## 2015-12-05 DIAGNOSIS — G9340 Encephalopathy, unspecified: Secondary | ICD-10-CM | POA: Diagnosis present

## 2015-12-05 DIAGNOSIS — G308 Other Alzheimer's disease: Secondary | ICD-10-CM | POA: Diagnosis not present

## 2015-12-05 DIAGNOSIS — E785 Hyperlipidemia, unspecified: Secondary | ICD-10-CM | POA: Diagnosis present

## 2015-12-05 DIAGNOSIS — Z66 Do not resuscitate: Secondary | ICD-10-CM | POA: Diagnosis present

## 2015-12-05 DIAGNOSIS — G309 Alzheimer's disease, unspecified: Secondary | ICD-10-CM | POA: Diagnosis present

## 2015-12-05 DIAGNOSIS — I82402 Acute embolism and thrombosis of unspecified deep veins of left lower extremity: Secondary | ICD-10-CM | POA: Diagnosis present

## 2015-12-05 DIAGNOSIS — J189 Pneumonia, unspecified organism: Secondary | ICD-10-CM | POA: Diagnosis not present

## 2015-12-05 DIAGNOSIS — R569 Unspecified convulsions: Secondary | ICD-10-CM

## 2015-12-05 DIAGNOSIS — E875 Hyperkalemia: Secondary | ICD-10-CM | POA: Diagnosis present

## 2015-12-05 DIAGNOSIS — L89224 Pressure ulcer of left hip, stage 4: Secondary | ICD-10-CM | POA: Diagnosis present

## 2015-12-05 DIAGNOSIS — J154 Pneumonia due to other streptococci: Secondary | ICD-10-CM | POA: Diagnosis present

## 2015-12-05 DIAGNOSIS — I10 Essential (primary) hypertension: Secondary | ICD-10-CM | POA: Diagnosis present

## 2015-12-05 DIAGNOSIS — J181 Lobar pneumonia, unspecified organism: Secondary | ICD-10-CM

## 2015-12-05 DIAGNOSIS — F03918 Unspecified dementia, unspecified severity, with other behavioral disturbance: Secondary | ICD-10-CM | POA: Diagnosis present

## 2015-12-05 DIAGNOSIS — I2699 Other pulmonary embolism without acute cor pulmonale: Secondary | ICD-10-CM | POA: Diagnosis present

## 2015-12-05 LAB — COMPREHENSIVE METABOLIC PANEL
ALBUMIN: 3.1 g/dL — AB (ref 3.5–5.0)
ALT: 26 U/L (ref 14–54)
ANION GAP: 15 (ref 5–15)
AST: 31 U/L (ref 15–41)
Alkaline Phosphatase: 71 U/L (ref 38–126)
BUN: 57 mg/dL — ABNORMAL HIGH (ref 6–20)
CHLORIDE: 108 mmol/L (ref 101–111)
CO2: 18 mmol/L — AB (ref 22–32)
Calcium: 9.2 mg/dL (ref 8.9–10.3)
Creatinine, Ser: 2.51 mg/dL — ABNORMAL HIGH (ref 0.44–1.00)
GFR calc non Af Amer: 17 mL/min — ABNORMAL LOW (ref >60)
GFR, EST AFRICAN AMERICAN: 20 mL/min — AB (ref >60)
Glucose, Bld: 152 mg/dL — ABNORMAL HIGH (ref 65–99)
POTASSIUM: 5.3 mmol/L — AB (ref 3.5–5.1)
SODIUM: 141 mmol/L (ref 135–145)
Total Bilirubin: 0.4 mg/dL (ref 0.3–1.2)
Total Protein: 8.3 g/dL — ABNORMAL HIGH (ref 6.5–8.1)

## 2015-12-05 LAB — PROCALCITONIN

## 2015-12-05 LAB — CBC
HCT: 29.7 % — ABNORMAL LOW (ref 36.0–46.0)
Hemoglobin: 9.3 g/dL — ABNORMAL LOW (ref 12.0–15.0)
MCH: 27.3 pg (ref 26.0–34.0)
MCHC: 31.3 g/dL (ref 30.0–36.0)
MCV: 87.1 fL (ref 78.0–100.0)
Platelets: 325 10*3/uL (ref 150–400)
RBC: 3.41 MIL/uL — ABNORMAL LOW (ref 3.87–5.11)
RDW: 16.2 % — ABNORMAL HIGH (ref 11.5–15.5)
WBC: 33.7 10*3/uL — ABNORMAL HIGH (ref 4.0–10.5)

## 2015-12-05 LAB — LACTIC ACID, PLASMA
LACTIC ACID, VENOUS: 3.9 mmol/L — AB (ref 0.5–2.0)
Lactic Acid, Venous: 3.5 mmol/L (ref 0.5–2.0)

## 2015-12-05 LAB — I-STAT CG4 LACTIC ACID, ED: Lactic Acid, Venous: 4.79 mmol/L (ref 0.5–2.0)

## 2015-12-05 LAB — TSH: TSH: 1.717 u[IU]/mL (ref 0.350–4.500)

## 2015-12-05 LAB — PROTIME-INR
INR: 6.57 (ref 0.00–1.49)
Prothrombin Time: 55.2 seconds — ABNORMAL HIGH (ref 11.6–15.2)

## 2015-12-05 MED ORDER — VANCOMYCIN HCL IN DEXTROSE 1-5 GM/200ML-% IV SOLN
1000.0000 mg | Freq: Once | INTRAVENOUS | Status: AC
  Administered 2015-12-05: 1000 mg via INTRAVENOUS
  Filled 2015-12-05: qty 200

## 2015-12-05 MED ORDER — VITAMIN K1 10 MG/ML IJ SOLN
2.5000 mg | Freq: Once | INTRAMUSCULAR | Status: AC
  Administered 2015-12-06: 2.5 mg via INTRAVENOUS
  Filled 2015-12-05: qty 0.25

## 2015-12-05 MED ORDER — MEMANTINE HCL 10 MG PO TABS: 10.0000 mg | ORAL_TABLET | Freq: Every day | ORAL | Status: DC

## 2015-12-05 MED ORDER — PIPERACILLIN-TAZOBACTAM 3.375 G IVPB 30 MIN
3.3750 g | Freq: Once | INTRAVENOUS | Status: AC
  Administered 2015-12-05: 3.375 g via INTRAVENOUS
  Filled 2015-12-05: qty 50

## 2015-12-05 MED ORDER — PANTOPRAZOLE SODIUM 40 MG PO TBEC: 40.0000 mg | DELAYED_RELEASE_TABLET | Freq: Every day | ORAL | Status: DC

## 2015-12-05 MED ORDER — ALPRAZOLAM 0.25 MG PO TABS: 0.2500 mg | ORAL_TABLET | Freq: Two times a day (BID) | ORAL | Status: DC

## 2015-12-05 MED ORDER — LORAZEPAM 2 MG/ML IJ SOLN: 1.0000 mg | Freq: Four times a day (QID) | INTRAMUSCULAR | Status: DC | PRN

## 2015-12-05 MED ORDER — WARFARIN - PHARMACIST DOSING INPATIENT: Freq: Every day | Status: DC

## 2015-12-05 MED ORDER — ACETAMINOPHEN 325 MG PO TABS
650.0000 mg | ORAL_TABLET | Freq: Four times a day (QID) | ORAL | Status: DC | PRN
  Administered 2015-12-12: 650 mg via ORAL
  Filled 2015-12-05: qty 2

## 2015-12-05 MED ORDER — VANCOMYCIN HCL IN DEXTROSE 750-5 MG/150ML-% IV SOLN
750.0000 mg | INTRAVENOUS | Status: DC
  Filled 2015-12-05: qty 150

## 2015-12-05 MED ORDER — SODIUM CHLORIDE 0.9 % IV BOLUS (SEPSIS)
500.0000 mL | INTRAVENOUS | Status: AC
  Administered 2015-12-05: 500 mL via INTRAVENOUS

## 2015-12-05 MED ORDER — PIPERACILLIN-TAZOBACTAM IN DEX 2-0.25 GM/50ML IV SOLN
2.2500 g | Freq: Four times a day (QID) | INTRAVENOUS | Status: DC
  Administered 2015-12-05 – 2015-12-09 (×15): 2.25 g via INTRAVENOUS
  Filled 2015-12-05 (×16): qty 50

## 2015-12-05 MED ORDER — LEVETIRACETAM 500 MG/5ML IV SOLN
500.0000 mg | Freq: Two times a day (BID) | INTRAVENOUS | Status: DC
  Administered 2015-12-05 – 2015-12-13 (×16): 500 mg via INTRAVENOUS
  Filled 2015-12-05 (×20): qty 5

## 2015-12-05 MED ORDER — ACETAMINOPHEN 650 MG RE SUPP: 650.0000 mg | Freq: Four times a day (QID) | RECTAL | Status: DC | PRN

## 2015-12-05 MED ORDER — OXYCODONE HCL 5 MG PO TABS: 2.5000 mg | ORAL_TABLET | Freq: Three times a day (TID) | ORAL | Status: DC | PRN

## 2015-12-05 MED ORDER — VANCOMYCIN HCL IN DEXTROSE 1-5 GM/200ML-% IV SOLN: 1000.0000 mg | Freq: Once | INTRAVENOUS | Status: DC

## 2015-12-05 MED ORDER — SODIUM CHLORIDE 0.9 % IV BOLUS (SEPSIS)
1000.0000 mL | Freq: Once | INTRAVENOUS | Status: AC
  Administered 2015-12-05: 1000 mL via INTRAVENOUS

## 2015-12-05 MED ORDER — SODIUM CHLORIDE 0.9 % IV BOLUS (SEPSIS)
500.0000 mL | Freq: Once | INTRAVENOUS | Status: AC
  Administered 2015-12-05: 500 mL via INTRAVENOUS

## 2015-12-05 MED ORDER — SODIUM CHLORIDE 0.9% FLUSH
3.0000 mL | Freq: Two times a day (BID) | INTRAVENOUS | Status: DC
  Administered 2015-12-05 – 2015-12-09 (×6): 3 mL via INTRAVENOUS

## 2015-12-05 MED ORDER — ACETAMINOPHEN 650 MG RE SUPP
650.0000 mg | Freq: Once | RECTAL | Status: AC
  Administered 2015-12-05: 650 mg via RECTAL
  Filled 2015-12-05: qty 1

## 2015-12-05 MED ORDER — PIPERACILLIN-TAZOBACTAM 3.375 G IVPB 30 MIN: 3.3750 g | Freq: Once | INTRAVENOUS | Status: DC

## 2015-12-05 MED ORDER — CETYLPYRIDINIUM CHLORIDE 0.05 % MT LIQD
7.0000 mL | Freq: Two times a day (BID) | OROMUCOSAL | Status: DC
  Administered 2015-12-05 – 2015-12-12 (×14): 7 mL via OROMUCOSAL

## 2015-12-05 MED ORDER — SODIUM CHLORIDE 0.9 % IV SOLN
INTRAVENOUS | Status: DC
  Administered 2015-12-05: 19:00:00 via INTRAVENOUS
  Administered 2015-12-06: 50 mL/h via INTRAVENOUS

## 2015-12-05 MED ORDER — PRAVASTATIN SODIUM 80 MG PO TABS
80.0000 mg | ORAL_TABLET | Freq: Every day | ORAL | Status: DC
  Filled 2015-12-05: qty 1

## 2015-12-05 MED ORDER — ONDANSETRON HCL 4 MG PO TABS: 4.0000 mg | ORAL_TABLET | Freq: Four times a day (QID) | ORAL | Status: DC | PRN

## 2015-12-05 MED ORDER — ENSURE ENLIVE PO LIQD
237.0000 mL | Freq: Three times a day (TID) | ORAL | Status: DC
  Administered 2015-12-09 – 2015-12-13 (×13): 237 mL via ORAL

## 2015-12-05 MED ORDER — ONDANSETRON HCL 4 MG/2ML IJ SOLN: 4.0000 mg | Freq: Four times a day (QID) | INTRAMUSCULAR | Status: DC | PRN

## 2015-12-05 MED ORDER — LEVETIRACETAM 500 MG PO TABS: 500.0000 mg | ORAL_TABLET | Freq: Two times a day (BID) | ORAL | Status: DC

## 2015-12-05 NOTE — ED Notes (Signed)
Call with updates or admission infoBrian Mckissack [phone], [phone]

## 2015-12-05 NOTE — ED Notes (Signed)
Bed: WA17 Expected date:  Expected time:  Means of arrival:  Comments: EMS, dementia, N/V

## 2015-12-05 NOTE — Progress Notes (Signed)
Pharmacy Antibiotic Note  Sabrina Long is a 80 y.o. female admitted on 12/05/2015 with sepsis.  Pharmacy has been consulted for Vancomycin and Zosyn  dosing.  Plan: Vancomycin 1gm x1, then Vancomycin 750 IV every 48 hours.  Goal trough 15-20 mcg/mL.  Zosyn 3.375gm x1, then 2.25gm iv q6hr  Height:  (162.6 cm) Weight: 127 lb (57.607 kg) IBW/kg (Calculated) : 54.7  Temp (24hrs), Avg:99.6 F (37.6 C), Min:99.6 F (37.6 C), Max:99.6 F (37.6 C)   Recent Labs Lab 12/05/15 1450 12/05/15 1554  WBC 33.7*  --   CREATININE 2.51*  --   LATICACIDVEN  --  4.79*    Estimated Creatinine Clearance: 16.2 mL/min (by C-G formula based on Cr of 2.51).    Allergies  Allergen Reactions  . Risperdal [Risperidone] Other (See Comments)    sedation    Antimicrobials this admission: Vancomycin  2/12 >>  Zosyn 2/12 >>   Thank you for allowing pharmacy to be a part of this patient's care.  Aleene Davidson Crowford 12/05/2015 4:04 PM

## 2015-12-05 NOTE — Progress Notes (Signed)
CRITICAL VALUE ALERT  Critical value received:  INR 6.57  Date of notification:  12/05/2015  Time of notification:  1948  Critical value read back:Yes.    Nurse who received alert:  L. Vergel de Lucy Chris RN  MD notified (1st page):  Lenny Pastel NP  Time of first page:  2050  MD notified (2nd page):  Time of second page:  Responding MD:  Lenny Pastel NP  Time MD responded:  2053

## 2015-12-05 NOTE — Progress Notes (Signed)
ANTICOAGULATION CONSULT NOTE - Initial Consult  Pharmacy Consult for warfarin Indication: VTE treatment  Allergies  Allergen Reactions  . Risperdal [Risperidone] Other (See Comments)    sedation    Patient Measurements: Height:  (162.6 cm) Weight: 127 lb (57.607 kg) IBW/kg (Calculated) : 54.7 Heparin Dosing Weight:   Vital Signs: Temp: 98.5 F (36.9 C) (02/12 1837) Temp Source: Axillary (02/12 1837) BP: 105/71 mmHg (02/12 1837) Pulse Rate: 124 (02/12 1837)  Labs:  Recent Labs  12/05/15 1450 12/05/15 1907  HGB 9.3*  --   HCT 29.7*  --   PLT 325  --   LABPROT  --  55.2*  INR  --  6.57*  CREATININE 2.51*  --     Estimated Creatinine Clearance: 16.2 mL/min (by C-G formula based on Cr of 2.51).   Medical History: History reviewed. No pertinent past medical history.  Medications:  Prescriptions prior to admission  Medication Sig Dispense Refill Last Dose  . alendronate (FOSAMAX) 70 MG tablet TAKE 1 TABLET ONCE A WEEK ON TUESDAYS WITH A FULL GLASS OF WATER ON AN EMPTY STOMACH 12 tablet 2 Past Week at Unknown time  . ALPRAZolam (XANAX) 0.5 MG tablet Take one tablet by mouth three times daily and one tablets at bedtime as needed for anxiety (Patient taking differently: Take 0.25-0.5 mg by mouth 2 (two) times daily. Take 0.25mg  in the morning and 0.5mg  at night) 120 tablet 3 12/04/2015 at Unknown time  . diclofenac sodium (VOLTAREN) 1 % GEL Apply 1 application topically 2 (two) times daily. Apply to knees and hips   12/04/2015 at Unknown time  . feeding supplement, ENSURE ENLIVE, (ENSURE ENLIVE) LIQD Take 237 mLs by mouth 3 (three) times daily between meals. 237 mL 12 12/04/2015 at Unknown time  . hydrALAZINE (APRESOLINE) 50 MG tablet Take 1 tablet (50 mg total) by mouth 3 (three) times daily. (Patient taking differently: Take 50 mg by mouth 2 (two) times daily. ) 270 tablet 0 12/04/2015 at Unknown time  . lactose free nutrition (BOOST) LIQD Take 237 mLs by mouth daily as  needed (for nutritional supplement).    Past Week at Unknown time  . levETIRAcetam (KEPPRA) 500 MG tablet TAKE 1 TABLET (500 MG TOTAL) BY MOUTH 2 (TWO) TIMES DAILY. 60 tablet 3 12/04/2015 at Unknown time  . losartan (COZAAR) 25 MG tablet TAKE 1 TABLET DAILY FOR BLOOD PRESSURE 90 tablet 1 12/04/2015 at Unknown time  . memantine (NAMENDA) 10 MG tablet TAKE 1 TABLET TWICE A DAY FOR MEMORY LOSS 180 tablet 0 12/04/2015 at Unknown time  . metoprolol tartrate (LOPRESSOR) 25 MG tablet TAKE ONE TABLET BY MOUTH TWICE DAILY TO CONTROL PULSE 60 tablet 3 12/04/2015 at 2030  . nitroGLYCERIN (NITROSTAT) 0.4 MG SL tablet Place 0.4 mg under the tongue every 5 (five) minutes as needed for chest pain. Take one tablet sublingual every 5 minutes not to exceed three tablets for chest pain   unknown  . omeprazole (PRILOSEC) 20 MG capsule TAKE ONE CAPSULE BY MOUTH EVERY DAY FOR REFLUX 30 capsule 2 12/04/2015 at Unknown time  . oxyCODONE (OXY IR/ROXICODONE) 5 MG immediate release tablet Take 0.5 tablets (2.5 mg total) by mouth 3 (three) times daily as needed for severe pain. 45 tablet 0 12/04/2015 at Unknown time  . pravastatin (PRAVACHOL) 80 MG tablet Take one tablet by mouth once daily for cholesterol (Patient taking differently: Take 80 mg by mouth daily. Take one tablet by mouth once daily for cholesterol) 30 tablet 0 12/04/2015  at Unknown time  . warfarin (COUMADIN) 2 MG tablet TAKE 1 TABLET BY MOUTH EVERY OTHER DAY ALTERNATING WITH  TABLET (Patient taking differently: TAKE 1 TABLET BY MOUTH on Mondays and Fridays with Warfarin  for a total of . On all other days take warfarin  with half a  tablet for a total of  all other days) 30 tablet 3 12/04/2015 at 2030  . warfarin (COUMADIN) 5 MG tablet Take 5 mg by mouth as directed. on Monday and Friday take along with 2 mg for a total of 7 mg on those day, and all other days take  + half a  tablet= , for  all other days   12/04/2015 at 2030  . cephALEXin  (KEFLEX) 500 MG capsule Take 1 capsule (500 mg total) by mouth 4 (four) times daily. (Patient not taking: Reported on 12/05/2015) 28 capsule 0 Completed Course at Unknown time  . ciprofloxacin (CIPRO) 250 MG tablet Take one tablet by mouth twice daily for 3 days for infection (Patient not taking: Reported on 12/05/2015) 6 tablet 0 Completed Course at Unknown time  . metoprolol tartrate (LOPRESSOR) 25 MG tablet TAKE ONE-HALF (1/2) TABLET TWICE A DAY (Patient not taking: Reported on 12/05/2015) 90 tablet 0 Not Taking at Unknown time  . phenazopyridine (PYRIDIUM) 200 MG tablet Take one tablet by mouth once daily as needed for bladder spasms (Patient not taking: Reported on 12/05/2015) 10 tablet 0 Completed Course at Unknown time  . saccharomyces boulardii (FLORASTOR) 250 MG capsule Take 1 capsule (250 mg total) by mouth 2 (two) times daily. (Patient not taking: Reported on 12/05/2015) 14 capsule 0 Completed Course at Unknown time   Scheduled:  . antiseptic oral rinse  7 mL Mouth Rinse BID  . feeding supplement (ENSURE ENLIVE)  237 mL Oral TID BM  . levETIRAcetam  500 mg Intravenous BID  . [START ON 12/06/2015] piperacillin-tazobactam (ZOSYN)  IV  2.25 g Intravenous 4 times per day  . sodium chloride flush  3 mL Intravenous Q12H  . [START ON 12/07/2015] vancomycin  750 mg Intravenous Q48H  . [START ON 12/06/2015] Warfarin - Pharmacist Dosing Inpatient   Does not apply q1800    Assessment: Patient with warfarin prior to admission for VTE treatment.  INR > 6 on admit.  MD wants pharmacy to dose warfarin.  Goal of Therapy:  INR 2-3    Plan:  No warfarin at this time INR daily  Aleene Davidson Crowford 12/05/2015,8:39 PM

## 2015-12-05 NOTE — Telephone Encounter (Signed)
This is just another sign that Sabrina Long's body is failing on her.  At this point, she might need a surgical procedure in order to put in a suprapubic catheter b/c of the catheters no longer staying in place and eroding at her urethra.  I do not recommend any surgery for her during this end stage of her dementia.  I again recommend hospice care for her.  I suspect the urologists would be resistant to performing a surgery on someone as fragile as she is.

## 2015-12-05 NOTE — Progress Notes (Signed)
CRITICAL VALUE ALERT  Critical value received:  Lactic acid 3.5  Date of notification:  12/05/15  Time of notification:  1830     Critical value read back:Yes.    Nurse who received alert:  Corrie Dandy Lacreshia Bondarenko  MD notified (1st page):  No, trending down.

## 2015-12-05 NOTE — Progress Notes (Signed)
CRITICAL VALUE ALERT  Critical value received:  Lactic acid 3.9  Date of notification:  12/05/2015  Time of notification:  2235  Critical value read back:Yes.    Nurse who received alert:  L. Vergel de Lucy Chris RN  MD notified (1st page):  Lenny Pastel NP  Time of first page:  2244  MD notified (2nd page):  Time of second page:  Responding MD:  [id number]  Time MD responded:  Lenny Pastel NP

## 2015-12-05 NOTE — H&P (Signed)
[facility] History and Physical  Sabrina Long XHB:[id number] DOB: Aug 07, 1936 DOA: 12/05/2015  Referring physician: ER physician: Dr. Lacretia Leigh PCP: Hollace Kinnier, DO  Chief Complaint: vomiting   HPI:  80 year old female with past medical history of PE, DVT (on AC with coamdin), dementia, hypertension who presented to [facility] ED with reports per her family of vomiting and poor PO intake for past few days prior to this admission. Due to pt dementia she is not able to provide details of medical history and her family is not at the bedside to give further details. In ED, her BP was 89/53 and she is receiving IV fluids, her 2nd L NS and repeat BP 110/60.Blood work showed WBC count 33.7, hgb 9.3, potassium 5.3, creatinine 2.51 (baseline WNL), lactic acid 4.79 and procalcitonin more than 175. CXR showed left lower lobe bronchopneumonia or aspiration pneumonia. Sepsis work up initiated. Started on vanco and zosyn. Admitted to telemetry.   Assessment & Plan    Principal Problem:   Sepsis due to pneumonia ([place]) / Lobar pneumonia, unspecified organism ([place]) / Leukocytosis - Sepsis criteria met on admission with T 101 F, HR 124, RR 27, hypotension, lactic acid 4.79, procalcitonin more than 175. Source of infection likely bronchopneumonia versus aspiration pneumonia. UA not collected on admission. - Sepsis and pneumonia order set placed - Started vanco and zosyn - Check blood cultures, resp culture, legionella, strep pneumonia, influenza - Monitor on telemetry   Active Problems:    Dementia with behavioral disturbance / Acute encephalopathy / Alzheimer's disease - Continue xanax and namenda once able to take PO  - SLP eval order placed     Bilateral pulmonary embolism (HCC) / LLE DVT - Coumadin on hold until INR level result back - SCD's meanwhile     Essential hypertension - Now hypotensive so BP meds on hold    Protein-calorie malnutrition, severe ([place]) - In the context of  dementia - Now lethargic so NPO    Hyperkalemia - Likely due to sepsis - Repeat admission lab     Seizures ([place]) - Keppra 500 mg Q 12 hours IV - No seizures    Dyslipidemia - Pravachol on hold till passes SLP    Acute kidney injury ([place]) - Due to sepsis  - Previous Cr WNL - Continue IV fluids - F/U BMP in am    DVT prophylaxis:  - Coumadin on hold due to AMS and while awaiting INR - SCDs bilaterally   Radiological Exams on Admission: Dg Chest 2 View 12/05/2015  1. Left lower lobe bronchopneumonia or aspiration pneumonia suspected. 2. Suboptimal inspiration which accounts for atelectasis at the right lung base. 3. Stable mild cardiomegaly without pulmonary edema. Electronically Signed   By: Evangeline Dakin M.D.   On: 12/05/2015 16:59    Code Status: DNR/DNI Family Communication: Family not at the bedside  Disposition Plan: Admit for further evaluation, telemetry   Leisa Lenz, MD  [facility] Pager [phone]  Time spent in minutes: 75 minutes  Review of Systems:  Unable to obtain due to AMS.  Past Medical History  Diagnosis Date  . Unspecified constipation   . Anxiety state, unspecified   . Colitis, enteritis, and gastroenteritis of presumed infectious origin 11/02/2011  . Pneumonia, organism unspecified 11/02/2011  . Cystitis, unspecified   . Impetigo   . Renal artery stenosis ([place])   . Dementia in conditions classified elsewhere with behavioral disturbance   . Hypercalcemia   . Reflux esophagitis   .  Chest pain, unspecified   . Abnormality of gait   . Hyperosmolality and/or hypernatremia   . Unspecified disorder of liver   . Candidiasis of vulva and vagina   . Alzheimer's disease   . Chest pain, unspecified 08/19/2008  . Dementia in conditions classified elsewhere without behavioral disturbance   . Depressive disorder, not elsewhere classified   . Esophageal reflux   . Other and unspecified hyperlipidemia   . Loss of weight   . 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 Reactive confusion   . Unspecified essential hypertension   . Other seborrheic keratosis   . Osteoarthrosis, unspecified whether generalized or localized, unspecified site   . Stiffness of joint, not elsewhere classified, unspecified site   . Memory loss 07/24/2007  . Headache(784.0)   . Undiagnosed cardiac murmurs   . Seizures (HCC)    Past Surgical History  Procedure Laterality Date  . Cesarean section     Social History:  reports that she has never smoked. She has never used smokeless tobacco. She reports that she does not drink alcohol or use illicit drugs.  Allergies  Allergen Reactions  . Risperdal [Risperidone] Other (See Comments)    sedation    Family History: Unable to obtain due to patient's AMS   Prior to Admission medications   Medication Sig Start Date End Date Taking? Authorizing Provider  alendronate (FOSAMAX) 70 MG tablet TAKE 1 TABLET ONCE A WEEK ON TUESDAYS WITH A FULL GLASS OF WATER ON AN EMPTY STOMACH 09/21/15  Yes [name] L Reed, DO  ALPRAZolam (XANAX) 0.5 MG tablet Take one tablet by mouth three times daily and one tablets at bedtime as needed for anxiety Patient taking differently: Take 0.25-0.5 mg by mouth 2 (two) times daily. Take 0.25mg in the morning and 0.5mg at night 04/05/15  Yes [name] L Reed, DO  diclofenac sodium (VOLTAREN) 1 % GEL Apply 1 application topically 2 (two) times daily. Apply to knees and hips   Yes Historical Provider, MD  feeding supplement, ENSURE ENLIVE, (ENSURE ENLIVE) LIQD Take 237 mLs by mouth 3 (three) times daily between meals. 02/17/15  Yes Fang Xu, MD  hydrALAZINE (APRESOLINE) 50 MG tablet Take 1 tablet (50 mg total) by mouth 3 (three) times daily. Patient taking differently: Take 50 mg by mouth 2 (two) times daily.  10/12/15  Yes [name] L Reed, DO  lactose free nutrition (BOOST) LIQD Take 237 mLs by mouth daily as needed (for nutritional supplement).     Yes Historical Provider, MD  levETIRAcetam (KEPPRA) 500 MG tablet TAKE 1 TABLET (500 MG TOTAL) BY MOUTH 2 (TWO) TIMES DAILY. 08/11/15  Yes [name] L Reed, DO  losartan (COZAAR) 25 MG tablet TAKE 1 TABLET DAILY FOR BLOOD PRESSURE 07/19/15  Yes [name] L Reed, DO  memantine (NAMENDA) 10 MG tablet TAKE 1 TABLET TWICE A DAY FOR MEMORY LOSS 11/04/15  Yes [name] L Reed, DO  metoprolol tartrate (LOPRESSOR) 25 MG tablet TAKE ONE TABLET BY MOUTH TWICE DAILY TO CONTROL PULSE 09/09/15  Yes [name] L Reed, DO  nitroGLYCERIN (NITROSTAT) 0.4 MG SL tablet Place 0.4 mg under the tongue every 5 (five) minutes as needed for chest pain. Take one tablet sublingual every 5 minutes not to exceed three tablets for chest pain   Yes Historical Provider, MD  omeprazole (PRILOSEC) 20 MG capsule TAKE ONE CAPSULE BY MOUTH EVERY DAY FOR REFLUX 07/19/15  Yes [name] L Reed, DO  oxyCODONE (OXY IR/ROXICODONE) 5 MG immediate release   tablet Take 0.5 tablets (2.5 mg total) by mouth 3 (three) times daily as needed for severe pain. 10/27/15  Yes Arthur G Green, MD  pravastatin (PRAVACHOL) 80 MG tablet Take one tablet by mouth once daily for cholesterol Patient taking differently: Take 80 mg by mouth daily. Take one tablet by mouth once daily for cholesterol 09/28/15  Yes [name] L Reed, DO  warfarin (COUMADIN) 2 MG tablet TAKE 1 TABLET BY MOUTH EVERY OTHER DAY ALTERNATING WITH 5MG TABLET Patient taking differently: TAKE 1 TABLET BY MOUTH on Mondays and Fridays with Warfarin 5mg for a total of 7mg. On all other days take warfarin 5mg with half a 2mg tablet for a total of 6mg all other days 11/08/15  Yes [name] L Reed, DO  warfarin (COUMADIN) 5 MG tablet Take 5 mg by mouth as directed. on Monday and Friday take along with 2 mg for a total of 7 mg on those day, and all other days take 5mg + half a 2mg tablet= 1mg, for 6mg all other days 08/05/15  Yes [name] L Reed, DO  cephALEXin (KEFLEX) 500 MG capsule Take 1 capsule (500 mg total) by mouth 4  (four) times daily. Patient not taking: Reported on 12/05/2015 07/23/15   Emily Roe Nguyen, MD  ciprofloxacin (CIPRO) 250 MG tablet Take one tablet by mouth twice daily for 3 days for infection Patient not taking: Reported on 12/05/2015 09/29/15   Monica Carter, DO  metoprolol tartrate (LOPRESSOR) 25 MG tablet TAKE ONE-HALF (1/2) TABLET TWICE A DAY Patient not taking: Reported on 12/05/2015 11/04/15   [name] L Reed, DO  phenazopyridine (PYRIDIUM) 200 MG tablet Take one tablet by mouth once daily as needed for bladder spasms Patient not taking: Reported on 12/05/2015 11/16/15   [name] L Reed, DO  saccharomyces boulardii (FLORASTOR) 250 MG capsule Take 1 capsule (250 mg total) by mouth 2 (two) times daily. Patient not taking: Reported on 12/05/2015 09/29/15   Monica Carter, DO   Physical Exam: Filed Vitals:   12/05/15 1549 12/05/15 1600 12/05/15 1633 12/05/15 1644  BP: 92/60 98/63 98/74   Pulse: 116 118 124   Temp:    101 F (38.3 C)  TempSrc:    Rectal  Resp: 20 27 23   Height:      Weight:      SpO2: 94% 96% 96%     Physical Exam  Constitutional: Appears malnourished, lethargic  HENT: Normocephalic. No erythema Eyes: Conjunctivae are normal. No scleral icterus.  Neck: Neck supple. No JVD. No tracheal deviation. No thyromegaly.  CVS: RRR, S1/S2 appreciated  Pulmonary: diminished breath sounds, no stridor, rhonchi, wheezes, rales.  Abdominal: Soft. BS +,  no distension, tenderness, rebound or guarding.  Musculoskeletal: No edema and no tenderness.  Lymphadenopathy: No lymphadenopathy noted, cervical, inguinal. Neuro: Lethargic, No focal neurologic deficits. Skin: Skin is warm and dry. Sacral ulcer (+) Psychiatric: Unable to assess due to AMS  Labs on Admission:  Basic Metabolic Panel:  Recent Labs Lab 12/05/15 1450  NA 141  K 5.3*  CL 108  CO2 18*  GLUCOSE 152*  BUN 57*  CREATININE 2.51*  CALCIUM 9.2   Liver Function Tests:  Recent Labs Lab 12/05/15 1450  AST 31  ALT  26  ALKPHOS 71  BILITOT 0.4  PROT 8.3*  ALBUMIN 3.1*   No results for input(s): LIPASE, AMYLASE in the last 168 hours. No results for input(s): AMMONIA in the last 168 hours. CBC:  Recent Labs Lab 12/05/15 1450  WBC 33.7*    HGB 9.3*  HCT 29.7*  MCV 87.1  PLT 325   Cardiac Enzymes: No results for input(s): CKTOTAL, CKMB, CKMBINDEX, TROPONINI in the last 168 hours. BNP: Invalid input(s): POCBNP CBG: No results for input(s): GLUCAP in the last 168 hours.  If 7PM-7AM, please contact night-coverage [email or web address] Password [email or web address] 12/05/2015, 5:04 PM       

## 2015-12-05 NOTE — ED Provider Notes (Signed)
CSN: [id number]     Arrival date & time 12/05/15  1345 History   First MD Initiated Contact with Patient 12/05/15 1509     Chief Complaint  Patient presents with  . Emesis  . Altered Mental Status     (Consider location/radiation/quality/duration/timing/severity/associated sxs/prior Treatment) HPI Comments: Pt here w/ emesis x 2 days and decreased mental status--bilious non-bilious and pt now able to keep down fluids w/o tx H/o dementia and ams and pt more confused than baseline Ems called and pt w/ strong smelling urine and pulse ox of 89% Pt is a DNR Transported here for futher care   Patient is a 80 y.o. female presenting with vomiting and altered mental status. The history is provided by the patient, medical records and the EMS personnel. The history is limited by the condition of the patient.  Emesis Altered Mental Status Associated symptoms: vomiting     Past Medical History  Diagnosis Date  . Unspecified constipation   . Anxiety state, unspecified   . Colitis, enteritis, and gastroenteritis of presumed infectious origin 11/02/2011  . Pneumonia, organism unspecified 11/02/2011  . Cystitis, unspecified   . Impetigo   . Renal artery stenosis (HCC)   . Dementia in conditions classified elsewhere with behavioral disturbance   . Hypercalcemia   . Reflux esophagitis   . Chest pain, unspecified   . Abnormality of gait   . Hyperosmolality and/or hypernatremia   . Unspecified disorder of liver   . Candidiasis of vulva and vagina   . Alzheimer's disease   . Chest pain, unspecified 08/19/2008  . Dementia in conditions classified elsewhere without behavioral disturbance   . Depressive disorder, not elsewhere classified   . Esophageal reflux   . Other and unspecified hyperlipidemia   . Loss of weight   . Unspecified persistent mental disorders due to conditions classified elsewhere   . Reactive confusion   . Unspecified essential hypertension   . Other seborrheic keratosis    . Osteoarthrosis, unspecified whether generalized or localized, unspecified site   . Stiffness of joint, not elsewhere classified, unspecified site   . Memory loss 07/24/2007  . Headache(784.0)   . Undiagnosed cardiac murmurs   . Seizures [facility])    Past Surgical History  Procedure Laterality Date  . Cesarean section     No family history on file. Social History  Substance Use Topics  . Smoking status: Never Smoker   . Smokeless tobacco: Never Used  . Alcohol Use: No   OB History    No data available     Review of Systems  Unable to perform ROS: Mental status change  Gastrointestinal: Positive for vomiting.      Allergies  Risperdal  Home Medications   Prior to Admission medications   Medication Sig Start Date End Date Taking? Authorizing Provider  alendronate (FOSAMAX) 70 MG tablet TAKE 1 TABLET ONCE A WEEK ON TUESDAYS WITH A FULL GLASS OF WATER ON AN EMPTY STOMACH 09/21/15   [name] L Reed, DO  ALPRAZolam Prudy Feeler) 0.5 MG tablet Take one tablet by mouth three times daily and one tablets at bedtime as needed for anxiety Patient taking differently: Take 0.25 mg by mouth 3 (three) times daily as needed for anxiety. Take one tablet by mouth three times daily and one tablets at bedtime as needed for anxiety 04/05/15   [name] L Reed, DO  cephALEXin (KEFLEX) 500 MG capsule Take 1 capsule (500 mg total) by mouth 4 (four) times daily. 07/23/15   Danelle Berry  Cyndie Chime, MD  ciprofloxacin (CIPRO) 250 MG tablet Take one tablet by mouth twice daily for 3 days for infection 09/29/15   Kirt Boys, DO  diclofenac sodium (VOLTAREN) 1 % GEL Apply 1 application topically 2 (two) times daily. Apply to knees and hips    Historical Provider, MD  feeding supplement, ENSURE ENLIVE, (ENSURE ENLIVE) LIQD Take 237 mLs by mouth 3 (three) times daily between meals. 02/17/15   Albertine Grates, MD  hydrALAZINE (APRESOLINE) 50 MG tablet Take 1 tablet (50 mg total) by mouth 3 (three) times daily. 10/12/15   [name] L  Reed, DO  hydrALAZINE (APRESOLINE) 50 MG tablet TAKE 1 TABLET(50 MG) BY MOUTH THREE TIMES DAILY 11/09/15   [name] L Reed, DO  lactose free nutrition (BOOST) LIQD Take 237 mLs by mouth 2 (two) times daily between meals.    Historical Provider, MD  levETIRAcetam (KEPPRA) 500 MG tablet TAKE 1 TABLET (500 MG TOTAL) BY MOUTH 2 (TWO) TIMES DAILY. 08/11/15   [name] L Reed, DO  losartan (COZAAR) 25 MG tablet TAKE 1 TABLET DAILY FOR BLOOD PRESSURE 07/19/15   [name] L Reed, DO  memantine (NAMENDA) 10 MG tablet TAKE 1 TABLET TWICE A DAY FOR MEMORY LOSS 11/04/15   [name] L Reed, DO  metoprolol tartrate (LOPRESSOR) 25 MG tablet TAKE ONE TABLET BY MOUTH TWICE DAILY TO CONTROL PULSE 09/09/15   [name] L Reed, DO  metoprolol tartrate (LOPRESSOR) 25 MG tablet TAKE ONE-HALF (1/2) TABLET TWICE A DAY 11/04/15   [name] L Reed, DO  nitroGLYCERIN (NITROSTAT) 0.4 MG SL tablet Place 0.4 mg under the tongue every 5 (five) minutes as needed for chest pain. Take one tablet sublingual every 5 minutes not to exceed three tablets for chest pain    Historical Provider, MD  omeprazole (PRILOSEC) 20 MG capsule TAKE ONE CAPSULE BY MOUTH EVERY DAY FOR REFLUX 06/14/15   [name] L Reed, DO  omeprazole (PRILOSEC) 20 MG capsule TAKE ONE CAPSULE BY MOUTH EVERY DAY FOR REFLUX 07/19/15   [name] L Reed, DO  oxyCODONE (OXY IR/ROXICODONE) 5 MG immediate release tablet Take 0.5 tablets (2.5 mg total) by mouth 3 (three) times daily as needed for severe pain. 10/27/15   Kimber Relic, MD  phenazopyridine (PYRIDIUM) 200 MG tablet Take one tablet by mouth once daily as needed for bladder spasms 11/16/15   Kermit Balo, DO  pravastatin (PRAVACHOL) 80 MG tablet Take one tablet by mouth once daily for cholesterol 09/28/15   [name] L Reed, DO  saccharomyces boulardii (FLORASTOR) 250 MG capsule Take 1 capsule (250 mg total) by mouth 2 (two) times daily. 09/29/15   Kirt Boys, DO  warfarin (COUMADIN) 2 MG tablet TAKE 1 TABLET BY MOUTH EVERY OTHER DAY  ALTERNATING WITH 5MG  TABLET 11/08/15   [name] L Reed, DO  warfarin (COUMADIN) 5 MG tablet 1 by mouth daily,on Monday and Wednesday take along with 2 mg for a total of 7 mg on those day 08/05/15   [name] L Reed, DO   BP 106/64 mmHg  Pulse 122  Temp(Src) 99.6 F (37.6 C) (Rectal)  Resp 24  SpO2 97% Physical Exam  Constitutional: She appears lethargic. She appears cachectic. She appears toxic. She has a sickly appearance. She appears ill.  HENT:  Head: Normocephalic and atraumatic.  Eyes: Conjunctivae, EOM and lids are normal. Pupils are equal, round, and reactive to light.  Neck: Normal range of motion. Neck supple. No tracheal deviation present. No thyroid mass present.  Cardiovascular: Regular rhythm and normal heart  sounds.  Tachycardia present.  Exam reveals no gallop.   No murmur heard. Pulmonary/Chest: Effort normal. No stridor. No respiratory distress. She has decreased breath sounds. She has wheezes. She has no rhonchi. She has no rales.  Abdominal: Soft. Normal appearance and bowel sounds are normal. She exhibits no distension. There is no tenderness. There is no rebound and no CVA tenderness.  Musculoskeletal: Normal range of motion. She exhibits no edema or tenderness.  Neurological: She appears lethargic. She displays atrophy. GCS eye subscore is 4. GCS verbal subscore is 3. GCS motor subscore is 6.  Skin: Skin is warm and dry. No abrasion and no rash noted.  Nursing note and vitals reviewed.   ED Course  Procedures (including critical care time) Labs Review Labs Reviewed  CULTURE, BLOOD (ROUTINE X 2)  CULTURE, BLOOD (ROUTINE X 2)  URINE CULTURE  COMPREHENSIVE METABOLIC PANEL  CBC  URINALYSIS, ROUTINE W REFLEX MICROSCOPIC (NOT AT [facility])  I-STAT CG4 LACTIC ACID, ED    Imaging Review No results found. I have personally reviewed and evaluated these images and lab results as part of my medical decision-making.   EKG Interpretation None      MDM   Final  diagnoses:  None    Code sepsis initiated. Patient started on empiric vancomycin and Zosyn. Chest x-ray urinalysis is pending at this time. Patient is a DO NOT RESUSCITATE and family has not been around speak with them. She has evidence of acute kidney injury and has been given a bolus of saline. Will be admitted to telemetry  CRITICAL CARE Performed by: Toy Baker Total critical care time: 45 minutes Critical care time was exclusive of separately billable procedures and treating other patients. Critical care was necessary to treat or prevent imminent or life-threatening deterioration. Critical care was time spent personally by me on the following activities: development of treatment plan with patient and/or surrogate as well as nursing, discussions with consultants, evaluation of patient's response to treatment, examination of patient, obtaining history from patient or surrogate, ordering and performing treatments and interventions, ordering and review of laboratory studies, ordering and review of radiographic studies, pulse oximetry and re-evaluation of patient's condition.     Lorre Nick, MD 12/05/15 [phone]

## 2015-12-05 NOTE — ED Notes (Signed)
Pt presents via EMS from home with c/o vomiting for 2 days per pt's son. Pt has a hx of dementia. Pt was able to keep down some fluids this morning. Per son, he went in to check on pt and she had a decreased LOC. Per pt, she is normally verbally non-communicative but can make eye contact. Pt's son she was not able to make eye contact when he went in to check on her and her respiratory status was questionable per son. Per EMS, pts lung sounds sound coarse and also has a small smell of urine, possible UTI. Pt also has several decubitis on her backside. Per fire, her O2 sat was 89% on RA. EMS vitals 146/80.

## 2015-12-06 DIAGNOSIS — L8990 Pressure ulcer of unspecified site, unspecified stage: Secondary | ICD-10-CM | POA: Diagnosis present

## 2015-12-06 LAB — URINE MICROSCOPIC-ADD ON

## 2015-12-06 LAB — URINALYSIS, ROUTINE W REFLEX MICROSCOPIC
Bilirubin Urine: NEGATIVE
GLUCOSE, UA: NEGATIVE mg/dL
KETONES UR: NEGATIVE mg/dL
Nitrite: NEGATIVE
Specific Gravity, Urine: 1.016 (ref 1.005–1.030)
pH: 6 (ref 5.0–8.0)

## 2015-12-06 LAB — LACTIC ACID, PLASMA
LACTIC ACID, VENOUS: 4.2 mmol/L — AB (ref 0.5–2.0)
LACTIC ACID, VENOUS: 2.6 mmol/L — AB (ref 0.5–2.0)
LACTIC ACID, VENOUS: 1.7 mmol/L (ref 0.5–2.0)
Lactic Acid, Venous: 5.2 mmol/L (ref 0.5–2.0)

## 2015-12-06 LAB — COMPREHENSIVE METABOLIC PANEL
ALBUMIN: 2.6 g/dL — AB (ref 3.5–5.0)
ALK PHOS: 66 U/L (ref 38–126)
ALT: 20 U/L (ref 14–54)
AST: 27 U/L (ref 15–41)
Anion gap: 16 — ABNORMAL HIGH (ref 5–15)
BILIRUBIN TOTAL: 0.5 mg/dL (ref 0.3–1.2)
BUN: 59 mg/dL — AB (ref 6–20)
CALCIUM: 7.7 mg/dL — AB (ref 8.9–10.3)
CO2: 16 mmol/L — ABNORMAL LOW (ref 22–32)
Chloride: 111 mmol/L (ref 101–111)
Creatinine, Ser: 2.66 mg/dL — ABNORMAL HIGH (ref 0.44–1.00)
GFR calc Af Amer: 19 mL/min — ABNORMAL LOW (ref >60)
GFR calc non Af Amer: 16 mL/min — ABNORMAL LOW (ref >60)
GLUCOSE: 91 mg/dL (ref 65–99)
Potassium: 4.5 mmol/L (ref 3.5–5.1)
Sodium: 143 mmol/L (ref 135–145)
TOTAL PROTEIN: 6.7 g/dL (ref 6.5–8.1)

## 2015-12-06 LAB — HEMOGLOBIN AND HEMATOCRIT, BLOOD
HCT: 29.7 % — ABNORMAL LOW (ref 36.0–46.0)
Hemoglobin: 9.3 g/dL — ABNORMAL LOW (ref 12.0–15.0)

## 2015-12-06 LAB — INFLUENZA PANEL BY PCR (TYPE A & B)
H1N1FLUPCR: NOT DETECTED
INFLBPCR: NEGATIVE
Influenza A By PCR: NEGATIVE

## 2015-12-06 LAB — PROTIME-INR
INR: 4.03 — ABNORMAL HIGH (ref 0.00–1.49)
Prothrombin Time: 38.2 seconds — ABNORMAL HIGH (ref 11.6–15.2)

## 2015-12-06 LAB — GLUCOSE, CAPILLARY: Glucose-Capillary: 85 mg/dL (ref 65–99)

## 2015-12-06 LAB — HIV ANTIBODY (ROUTINE TESTING W REFLEX): HIV Screen 4th Generation wRfx: NONREACTIVE

## 2015-12-06 MED ORDER — PRO-STAT SUGAR FREE PO LIQD
30.0000 mL | Freq: Two times a day (BID) | ORAL | Status: DC
  Administered 2015-12-09 – 2015-12-13 (×9): 30 mL via ORAL
  Filled 2015-12-06 (×10): qty 30

## 2015-12-06 MED ORDER — SODIUM CHLORIDE 0.9 % IV BOLUS (SEPSIS)
500.0000 mL | Freq: Once | INTRAVENOUS | Status: AC
  Administered 2015-12-06: 500 mL via INTRAVENOUS

## 2015-12-06 MED ORDER — VITAMINS A & D EX OINT
TOPICAL_OINTMENT | CUTANEOUS | Status: AC
Start: 2015-12-06 — End: 2015-12-06
  Administered 2015-12-06: 5
  Filled 2015-12-06: qty 5

## 2015-12-06 NOTE — Telephone Encounter (Signed)
Called and spoke with son, Arlys John. Patient is in hospital and they think she has pneumonia but the Cath was replaced and is working well this time. Son will call and schedule an appointment once patient is released.

## 2015-12-06 NOTE — Progress Notes (Signed)
CRITICAL VALUE ALERT  Critical value received:  Lactic Acid 4.2  Date of notification:  12/06/2015  Time of notification:  0358  Critical value read back:Yes.    Nurse who received alert:  L. Vergel de Lucy Chris RN  MD notified (1st page):  Benedetto Coons NP  Time of first page:  0400  MD notified (2nd page):  Time of second page:  Responding MD:  Benedetto Coons NP  Time MD responded:  [phone]

## 2015-12-06 NOTE — Progress Notes (Signed)
CRITICAL VALUE ALERT  Critical value received:  Lactic acid 5.2  Date of notification:  12/06/2015  Time of notification:  0055  Critical value read back:Yes.    Nurse who received alert:  L. Vergel de Lucy Chris RN  MD notified (1st page):  Benedetto Coons NP  Time of first page:  0100  MD notified (2nd page):  Time of second page:  Responding MD:  Benedetto Coons NP  Time MD responded:  [phone]

## 2015-12-06 NOTE — Progress Notes (Signed)
PT Cancellation Note  Patient Details Name: Sabrina Long MRN: [id number] DOB: 11-Apr-1936   Cancelled Treatment:    Reason Eval/Treat Not Completed: Fatigue/lethargy limiting ability to participate (pt is lethargic and does not arouse to stimuli per RN. RN requested PT be held today, will attempt tomorrow. )   Tamala Ser 12/06/2015, 1:34 PM [phone]

## 2015-12-06 NOTE — Progress Notes (Signed)
Chronic foley catheter changed with size Fr.18. Large amount of bloody urine came as soon as I deflated the old foley catheter. Pharmacy was made aware of the bloody urine, Lenny Pastel NP was made aware of it too with new orders. Will continue to monitor patient.

## 2015-12-06 NOTE — Evaluation (Signed)
SLP Cancellation Note  Patient Details Name: Sabrina Long MRN: [id number] DOB: Apr 16, 1936   Cancelled treatment:       Reason Eval/Treat Not Completed: Fatigue/lethargy limiting ability to participate RN reports pt lethargic at this time. Will reattempt eval at later time.    Donavan Burnet, MS [facility] SLP [phone]

## 2015-12-06 NOTE — Progress Notes (Signed)
Pharmacy - Warfarin  Assessment: 89 yoF on warfarin PTA for previous bilateral PE.  INR > 6 on admit. Pharmacy to dose. RN noted large amt of bloody urine with removal of Foley overnight.   INR > 4 this AM  Hgb low but stable, Plt wnl  Plan:   Continue to hold warfarin; f/u plans for long term anticoag  Bernadene Person, PharmD, BCPS Pager: [phone] 12/06/2015, 10:55 AM

## 2015-12-06 NOTE — Progress Notes (Signed)
Initial Nutrition Assessment  INTERVENTION:   -Continue Ensure Enlive po TID, each supplement provides 350 kcal and 20 grams of protein -Provide Prostat liquid protein PO 30 ml BID with meals, each supplement provides 100 kcal, 15 grams protein. -RD to continue to monitor  NUTRITION DIAGNOSIS:   Increased nutrient needs related to wound healing as evidenced by estimated needs.  GOAL:   Patient will meet greater than or equal to 90% of their needs  MONITOR:   PO intake, Supplement acceptance, Labs, Weight trends, Skin, I & O's  REASON FOR ASSESSMENT:   Consult Assessment of nutrition requirement/status  ASSESSMENT:   80 year old female with past medical history of PE, DVT (on AC with coamdin), dementia, hypertension who presented to [facility] ED with reports per her family of vomiting and poor PO intake for past few days prior to this admission. Due to pt dementia she is not able to provide details of medical history and her family is not at the bedside to give further details.  Pt asleep with family at bedside. Pt is nonverbal so all history was obtained from family. Per family, pt was eating well and was eating anything she wanted prior to developing vomiting for 1 day PTA. Family denies any chewing or swallowing issues PTA. Pt did not eat any breakfast this morning d/t lethargy. SLP to evaluate. Family reports pt last ate on 2/11, she had soup, crackers and ginger ale.  Family is amenable to RD ordering Prostat supplement for additional protein. Pt with Stg III and IV pressure ulcers.  Per weight history, pt has gained weight recently. In 2015, her weight ranged from 135-145 lb.  Unable to perform NFPE as pt asleep and family did not want her disturbed.  Labs reviewed: Elevated BUN & Creatinine  Diet Order:  Diet Heart Room service appropriate?: Yes; Fluid consistency:: Thin  Skin:  Wound (see comment) (Stage IV hip and Sage III coccyx ulcers)  Last BM:  PTA   Height:   Ht  Readings from Last 1 Encounters:  12/05/15  (1.626 m)    Weight:   Wt Readings from Last 1 Encounters:  12/05/15 127 lb (57.607 kg)    Ideal Body Weight:  54.5 kg  BMI:  Body mass index is 21.79 kg/(m^2).  Estimated Nutritional Needs:   Kcal:  1700-1900  Protein:  85-95g  Fluid:  1.7-1.9L/day  EDUCATION NEEDS:   No education needs identified at this time  Tilda Franco, MS, RD, LDN Pager: [phone] After Hours Pager: [phone]

## 2015-12-06 NOTE — Progress Notes (Signed)
Patient's HR has been sustaining at 140-150s, sinus tachycardia, BP low. Benedetto Coons NP was made aware. New orders was placed.

## 2015-12-06 NOTE — Progress Notes (Addendum)
Patient ID: Sabrina Long, female   DOB: Sep 19, 1936, 80 y.o.   MRN: [id number] [facility] PROGRESS NOTE  STAYCE DELANCY WUJ:[id number] DOB: 1936-02-23 DOA: 12/05/2015 PCP: Hollace Kinnier, DO  Brief narrative:    80 year old female with past medical history of PE, DVT (on AC with coamdin), dementia, hypertension who presented to [facility] ED with reports per her family of vomiting and poor PO intake for past few days prior to this admission.   In ED, her BP was 89/53 and she is receiving IV fluids, her 2nd L NS and repeat BP 110/60.Blood work showed WBC count 33.7, hgb 9.3, potassium 5.3, creatinine 2.51 (baseline WNL), lactic acid 4.79 and procalcitonin more than 175. CXR showed left lower lobe bronchopneumonia or aspiration pneumonia. Sepsis work up initiated. Started on vanco and zosyn.  Assessment/Plan:    Principal Problem:  Sepsis due to pneumonia ([place]) / Lobar pneumonia, unspecified organism ([place]) / Leukocytosis - Sepsis criteria met on admission with T 101 F, HR 124, RR 27, hypotension, lactic acid 4.79, procalcitonin more than 175. Source of infection likely bronchopneumonia versus aspiration pneumonia. She also has gram-negative rod bacteremia based on blood culture results and preliminary report - Sepsis and pneumonia order set placed on the admission - Continue broad-spectrum antibiotics, vancomycin and Zosyn - Legionella is pending, strep pneumonia pending. Influenza is negative. - Continue to monitor on telemetry  Active Problems:    Gram-negative rod bacteremia - Blood cultures on the admission are growing gram-negative rods, final report is pending - Zosyn will cover for this   Dementia with behavioral disturbance / Acute encephalopathy / Alzheimer's disease - Stable    Bilateral pulmonary embolism (HCC) / LLE DVT - Coumadin on hold because of supratherapeutic INR. Patient has received 2.5 mg IV vitamin K 12/05/2015.   Essential hypertension - All blood  pressure medications on hold because of hypotension on the admission - Current blood pressure 98/64   Protein-calorie malnutrition, severe (HCC) - In the context of chronic illness, worsening dementia - Awaiting SLP evaluation   Hyperkalemia - Likely due to sepsis - Repeat potassium within normal limits   Seizures (HCC) - Keppra 500 mg Q 12 hours IV - No reports of seizures since the admission   Dyslipidemia - SLP evaluation pending   Acute kidney injury ([place]) - Likely secondary to sepsis. Per chart review previous creatinine levels were all within normal limits - Creatinine 2.5 on the admission and 2.6 today - Continue to monitor renal function    Pressure ulcers - Pressure ulcers in different stages 3, 4 and unstageable - Appreciate wound care assessment   DVT Prophylaxis  - Coumadin on hold due to supratherapeutic INR - SCDs bilaterally   Code Status: DNR/DNI Family Communication:  plan of care discussed with the patient's son over the phone today Disposition Plan: Home once sepsis etiology results. Patient's son at the bedside confirms hospice already set up at home.   IV access:  Peripheral IV  Procedures and diagnostic studies:    Dg Chest 2 View 12/05/2015  1. Left lower lobe bronchopneumonia or aspiration pneumonia suspected. 2. Suboptimal inspiration which accounts for atelectasis at the right lung base. 3. Stable mild cardiomegaly without pulmonary edema.   Medical Consultants:  None   Other Consultants:  PT Nutrition  IAnti-Infectives:   Vanco and zosyn 12/05/2015 -->   Leisa Lenz, MD  [facility] Pager [phone]  Time spent in minutes: 25 minutes  If 7PM-7AM, please contact night-coverage [email or web address] Password  [email or web address] 12/06/2015, 11:11 AM   LOS: 1 day    HPI/Subjective: No acute overnight events. No vomiting. No respiratory distress.  Objective: Filed Vitals:   12/05/15 2105 12/06/15 0030 12/06/15 0211 12/06/15 0621  BP:  87/47 98/80 100/60 98/64  Pulse: 125 140 150 126  Temp: 98.8 F (37.1 C)   98.2 F (36.8 C)  TempSrc: Axillary   Axillary  Resp: 20   20  Height:      Weight:      SpO2: 97%   95%    Intake/Output Summary (Last 24 hours) at 12/06/15 1111 Last data filed at 12/06/15 0700  Gross per 24 hour  Intake 1819.17 ml  Output    240 ml  Net 1579.17 ml    Exam:   General:  Pt is not in acute distress  Cardiovascular: Regular rate and rhythm, S1/S2 (+)  Respiratory: Clear to auscultation bilaterally, no wheezing, no crackles, no rhonchi  Abdomen: Soft, non tender, non distended, bowel sounds present  Extremities: No edema, pulses DP and PT palpable bilaterally  Neuro: Grossly nonfocal  Data Reviewed: Basic Metabolic Panel:  Recent Labs Lab 12/05/15 1450 12/06/15 0318  NA 141 143  K 5.3* 4.5  CL 108 111  CO2 18* 16*  GLUCOSE 152* 91  BUN 57* 59*  CREATININE 2.51* 2.66*  CALCIUM 9.2 7.7*   Liver Function Tests:  Recent Labs Lab 12/05/15 1450 12/06/15 0318  AST 31 27  ALT 26 20  ALKPHOS 71 66  BILITOT 0.4 0.5  PROT 8.3* 6.7  ALBUMIN 3.1* 2.6*   No results for input(s): LIPASE, AMYLASE in the last 168 hours. No results for input(s): AMMONIA in the last 168 hours. CBC:  Recent Labs Lab 12/05/15 1450 12/06/15 0005  WBC 33.7*  --   HGB 9.3* 9.3*  HCT 29.7* 29.7*  MCV 87.1  --   PLT 325  --    Cardiac Enzymes: No results for input(s): CKTOTAL, CKMB, CKMBINDEX, TROPONINI in the last 168 hours. BNP: Invalid input(s): POCBNP CBG:  Recent Labs Lab 12/06/15 0747  GLUCAP 85    Blood Culture (routine x 2)     Status: None (Preliminary result)   Collection Time: 12/05/15  2:50 PM  Result Value Ref Range Status   Specimen Description BLOOD RIGHT ANTECUBITAL  Final   Culture  Setup Time   Final    GRAM NEGATIVE RODS   Culture PENDING  Incomplete   Report Status PENDING  Incomplete  Blood Culture (routine x 2)     Status: None (Preliminary result)    Collection Time: 12/05/15  3:30 PM  Result Value Ref Range Status   Specimen Description BLOOD BLOOD RIGHT FOREARM  Final   Culture  Setup Time   Final    GRAM NEGATIVE RODS   Culture PENDING  Incomplete   Report Status PENDING  Incomplete     Scheduled Meds: . antiseptic oral rinse  7 mL Mouth Rinse BID  . feeding supplement (ENSURE ENLIVE)  237 mL Oral TID BM  . feeding supplement (PRO-STAT SUGAR FREE 64)  30 mL Oral BID  . levETIRAcetam  500 mg Intravenous BID  . piperacillin-tazobactam (ZOSYN)  IV  2.25 g Intravenous 4 times per day  . sodium chloride flush  3 mL Intravenous Q12H  . [START ON 12/07/2015] vancomycin  750 mg Intravenous Q48H  . Warfarin - Pharmacist Dosing Inpatient   Does not apply q1800   Continuous Infusions: . sodium chloride 50 mL/hr at 12/05/15  [place]

## 2015-12-06 NOTE — Consult Note (Addendum)
WOC wound consult note Reason for Consult: Chronic pressure injury to left trochanter, coccyx, present on admission.  Wound type:Pressure injury Pressure Ulcer POA: Yes Measurement:Left hip 3 cm x 2.5 cm x 2 cm with tunneling at 10 o'clock, 0.2 cm Stage 4 Coccyx 2 cm x 1 cm x 1 cm stage 3 Wound bed:100% pale pink nongranulating.  Drainage (amount, consistency, odor) Moderate serosanguinous  Musty  Periwound:Intact Dressing procedure/placement/frequency:Cleanse wounds to left hip and coccyx with NS and pat gently dry.  Apply calcium alginate to wound bed, gently fill wound depth.  Cover with 4x4 gauze and secure with tape.  Change daily.  Will not follow at this time.  Please re-consult if needed.  Maple Hudson RN BSN CWON Pager [phone]

## 2015-12-06 NOTE — Progress Notes (Signed)
CRITICAL VALUE ALERT  Critical value received:  Blood culture showed gram negative rods  Date of notification:  12/06/2015  Time of notification:  0720  Critical value read back:Yes.    Nurse who received alert:  L. Vergel de Lucy Chris RN  MD notified (1st page):  A. Elisabeth Pigeon MD  Time of first page:  0807  MD notified (2nd page):  Time of second page:  Responding MD:  A. Elisabeth Pigeon MD  Time MD responded:  [phone]

## 2015-12-06 NOTE — Progress Notes (Signed)
CRITICAL VALUE ALERT  Critical value received:  Blood culture aerobic bottle with gram negative rods  Date of notification:  12/06/2015  Time of notification:  0628  Critical value read back:Yes.    Nurse who received alert:  L. Vergel de Lucy Chris RN  MD notified (1st page):  Lenny Pastel NP  Time of first page:  0631  MD notified (2nd page):  Time of second page:  Responding MD:  Benedetto Coons Np  Time MD responded:  [phone]

## 2015-12-07 LAB — STREP PNEUMONIAE URINARY ANTIGEN: STREP PNEUMO URINARY ANTIGEN: NEGATIVE

## 2015-12-07 LAB — PROTIME-INR
INR: 1.69 — ABNORMAL HIGH (ref 0.00–1.49)
Prothrombin Time: 19.8 seconds — ABNORMAL HIGH (ref 11.6–15.2)

## 2015-12-07 LAB — GLUCOSE, CAPILLARY
GLUCOSE-CAPILLARY: 57 mg/dL — AB (ref 65–99)
Glucose-Capillary: 135 mg/dL — ABNORMAL HIGH (ref 65–99)

## 2015-12-07 MED ORDER — DEXTROSE 5 % IV SOLN
INTRAVENOUS | Status: DC
  Administered 2015-12-07: 50 mL via INTRAVENOUS
  Administered 2015-12-08 – 2015-12-09 (×3): via INTRAVENOUS
  Administered 2015-12-10: 50 mL via INTRAVENOUS
  Administered 2015-12-11: 13:00:00 via INTRAVENOUS
  Administered 2015-12-12: 50 mL via INTRAVENOUS

## 2015-12-07 MED ORDER — DEXTROSE 50 % IV SOLN
INTRAVENOUS | Status: AC
  Administered 2015-12-07: 50 mL
  Filled 2015-12-07: qty 50

## 2015-12-07 MED ORDER — DEXTROSE 50 % IV SOLN
25.0000 mL | Freq: Once | INTRAVENOUS | Status: AC
  Administered 2015-12-07: 25 mL via INTRAVENOUS

## 2015-12-07 NOTE — Progress Notes (Addendum)
Pharmacy - Warfarin  Assessment: 68 yoF on warfarin PTA for previous bilateral PE.  INR > 6 on admit. Pharmacy to dose.   2/13: RN noted large amt of bloody urine with removal of Foley overnight 2/14: RN notes no frank blood but tea-colored urine and remains lethargic unable to take POs   INR subtherapeutic at 1.7 this AM  Hgb low but stable, Plt wnl  Plan:   Continue to hold warfarin (SCDs on) per MD; f/u plans for long term anticoag  Bernadene Person, PharmD, BCPS Pager: [phone] 12/07/2015, 12:15 PM

## 2015-12-07 NOTE — Evaluation (Signed)
SLP Cancellation Note  Patient Details Name: Sabrina Long MRN: [id number] DOB: [birth year]   Cancelled treatment:       Reason Eval/Treat Not Completed: Fatigue/lethargy limiting ability to participate (pt did not awaken adequately for po or eval with cold hand stimulation, will continue efforts)   Chales Abrahams 12/07/2015, 11:09 AM

## 2015-12-07 NOTE — Progress Notes (Addendum)
Patient ID: Sabrina Long, female   DOB: 1936-08-20, 80 y.o.   MRN: [id number] [facility] PROGRESS NOTE  Sabrina Long PIR:[id number] DOB: 1935-12-16 DOA: 12/05/2015 PCP: Hollace Kinnier, DO  Brief narrative:    80 year old female with past medical history of PE, DVT (on AC with coamdin), dementia, hypertension who presented to [facility] ED with reports per her family of vomiting and poor PO intake for past few days prior to this admission.   In ED, her BP was 89/53 and she is receiving IV fluids, her 2nd L NS and repeat BP 110/60.Blood work showed WBC count 33.7, hgb 9.3, potassium 5.3, creatinine 2.51 (baseline WNL), lactic acid 4.79 and procalcitonin more than 175. CXR showed left lower lobe bronchopneumonia or aspiration pneumonia. Sepsis work up initiated. Started on vanco and zosyn.  Hospital course complicated with gram-negative rod bacteremia due to Escherichia coli.   Assessment/Plan:    Principal Problem:  Sepsis due to pneumonia ([place])  And E.coli bacteremia / Leukocytosis  - Sepsis criteria met on admission with T 101 F, HR 124, RR 27, hypotension, lactic acid 4.79, procalcitonin more than 175.  - Source of infection likely bronchopneumonia and aspiration pneumonia as seen on chest x-ray but she also has Escherichia coli bacteremia which now we know based on blood culture results - She was started on vancomycin and Zosyn. We'll stop vancomycin today and continue Zosyn pending sensitivity report. - Legionella is pending, Influenza is negative  Active Problems:   Dementia with behavioral disturbance / Acute encephalopathy / Alzheimer's disease - Stable    Bilateral pulmonary embolism (HCC) / LLE DVT - Coumadin on hold because of supratherapeutic INR on admission. Patient has received 2.5 mg IV vitamin K 12/05/2015. - She is lethargic and due to intermittent hematuria it is reasonable to continue to hold Coumadin. Will manage DVT prophylaxis with SCD    Essential  hypertension - All blood pressure medications on hold because of hypotension   Protein-calorie malnutrition, severe (HCC) - In the context of chronic illness, worsening dementia - Because of altered mental status she is NPO   Hyperkalemia - Likely due to sepsis - Repeat potassium within normal limits   Seizures (HCC) - Keppra 500 mg Q 12 hours IV - No reports of seizures since the admission   Acute kidney injury ([place]) - Likely secondary to sepsis. Per chart review previous creatinine levels were all within normal limits - Creatinine 2.5 on the admission and further up to 2.6 - Continue to monitor renal function - Continue IV fluids for hydration    Pressure ulcers, left hip - Pressure ulcers in different stages 3, 4 and unstageable - Appreciate wound care assessment  DVT Prophylaxis  - Coumadin on hold due to supratherapeutic INR - SCDs bilaterally   Code Status: DNR/DNI Family Communication:  plan of care discussed with the patient's son over the phone today Disposition Plan: Patient's son at the bedside confirms hospice already set up at home. D/C by 12/10/2015   IV access:  Peripheral IV  Procedures and diagnostic studies:    Dg Chest 2 View 12/05/2015  1. Left lower lobe bronchopneumonia or aspiration pneumonia suspected. 2. Suboptimal inspiration which accounts for atelectasis at the right lung base. 3. Stable mild cardiomegaly without pulmonary edema.   Medical Consultants:  None   Other Consultants:  PT Nutrition  IAnti-Infectives:   Vanco and zosyn 12/05/2015 -->   Leisa Lenz, MD  [facility] Pager [phone]  Time spent in minutes:  25 minutes  If 7PM-7AM, please contact night-coverage [email or web address] Password [email or web address] 12/07/2015, 12:03 PM   LOS: 2 days    HPI/Subjective: No acute overnight events. Minimally opens eyes to painful stimulation. Does not respond to verbal stimulation.  Objective: Filed Vitals:   12/06/15 0621 12/06/15  1539 12/06/15 2121 12/07/15 0503  BP: 98/64 112/62 111/55 108/77  Pulse: 126  128 113  Temp: 98.2 F (36.8 C)  99.6 F (37.6 C) 98.9 F (37.2 C)  TempSrc: Axillary  Axillary Axillary  Resp: _0 Height:      Weight:    54.296 kg (119 lb 11.2 oz)  SpO2: 95% 97% 96% 98%    Intake/Output Summary (Last 24 hours) at 12/07/15 1203 Last data filed at 12/07/15 1117  Gross per 24 hour  Intake   1455 ml  Output    900 ml  Net    555 ml    Exam:   General:  Pt is responding minimally to painful stimuli  Cardiovascular: Rate control, appreciate S1-S2  Respiratory: no wheezing, no rhonchi  Abdomen: Appreciate bowel sounds no tenderness  Extremities: No swelling, pulses palpable  Neuro: No focal deficits  Data Reviewed: Basic Metabolic Panel:  Recent Labs Lab 12/05/15 1450 12/06/15 0318  NA 141 143  K 5.3* 4.5  CL 108 111  CO2 18* 16*  GLUCOSE 152* 91  BUN 57* 59*  CREATININE 2.51* 2.66*  CALCIUM 9.2 7.7*   Liver Function Tests:  Recent Labs Lab 12/05/15 1450 12/06/15 0318  AST 31 27  ALT 26 20  ALKPHOS 71 66  BILITOT 0.4 0.5  PROT 8.3* 6.7  ALBUMIN 3.1* 2.6*   No results for input(s): LIPASE, AMYLASE in the last 168 hours. No results for input(s): AMMONIA in the last 168 hours. CBC:  Recent Labs Lab 12/05/15 1450 12/06/15 0005  WBC 33.7*  --   HGB 9.3* 9.3*  HCT 29.7* 29.7*  MCV 87.1  --   PLT 325  --    Cardiac Enzymes: No results for input(s): CKTOTAL, CKMB, CKMBINDEX, TROPONINI in the last 168 hours. BNP: Invalid input(s): POCBNP CBG:  Recent Labs Lab 12/06/15 0747 12/07/15 0759 12/07/15 0856  GLUCAP 85 57* 135*   Results for orders placed or performed during the hospital encounter of 12/05/15  Blood Culture (routine x 2)     Status: None (Preliminary result)   Collection Time: 12/05/15  2:50 PM  Result Value Ref Range Status   Specimen Description BLOOD RIGHT ANTECUBITAL  Final   Special Requests BOTTLES DRAWN AEROBIC  AND ANAEROBIC 5CC EA  Final   Culture  Setup Time   Final    GRAM NEGATIVE RODS IN BOTH AEROBIC AND ANAEROBIC BOTTLES CRITICAL RESULT CALLED TO, READ BACK BY AND VERIFIED WITH: L. VERGELDEDIOS,RN AT 0929 ON [id number] BY Rhea Bleacher    Culture   Final    ESCHERICHIA COLI Performed at [facility]    Report Status PENDING  Incomplete  Blood Culture (routine x 2)     Status: None (Preliminary result)   Collection Time: 12/05/15  3:30 PM  Result Value Ref Range Status   Specimen Description BLOOD BLOOD RIGHT FOREARM  Final   Special Requests BOTTLES DRAWN AEROBIC AND ANAEROBIC 10CC EA  Final   Culture  Setup Time   Final    GRAM NEGATIVE RODS IN BOTH AEROBIC AND ANAEROBIC BOTTLES CRITICAL RESULT CALLED TO, READ BACK BY AND VERIFIED WITH: L VERGELDEDIOS,RN [id number] WILDERK  Culture   Final    ESCHERICHIA COLI Performed at [facility]    Report Status PENDING  Incomplete     Scheduled Meds: . antiseptic oral rinse  7 mL Mouth Rinse BID  . feeding supplement (ENSURE ENLIVE)  237 mL Oral TID BM  . feeding supplement (PRO-STAT SUGAR FREE 64)  30 mL Oral BID  . levETIRAcetam  500 mg Intravenous BID  . piperacillin-tazobactam (ZOSYN)  IV  2.25 g Intravenous 4 times per day  . sodium chloride flush  3 mL Intravenous Q12H  . vancomycin  750 mg Intravenous Q48H  . Warfarin - Pharmacist Dosing Inpatient   Does not apply q1800   Continuous Infusions: . dextrose 50 mL (12/07/15 0829)

## 2015-12-07 NOTE — Progress Notes (Signed)
PT Cancellation Note  Patient Details Name: Sabrina Long MRN: [id number] DOB: [birth year]   Cancelled Treatment:    Reason Eval/Treat Not Completed: PT screened, no needs identified, will sign off. Pt has end stage dementia. She is nonverbal and bedbound/total assist at baseline. Pt does not appear to be appropriate for PT services. Will sign off. Thanks.    Rebeca Alert, MPT Pager: [phone]

## 2015-12-08 LAB — BASIC METABOLIC PANEL WITH GFR
Anion gap: 12 (ref 5–15)
BUN: 43 mg/dL — ABNORMAL HIGH (ref 6–20)
CO2: 16 mmol/L — ABNORMAL LOW (ref 22–32)
Calcium: 7.7 mg/dL — ABNORMAL LOW (ref 8.9–10.3)
Chloride: 120 mmol/L — ABNORMAL HIGH (ref 101–111)
Creatinine, Ser: 1.95 mg/dL — ABNORMAL HIGH (ref 0.44–1.00)
GFR calc Af Amer: 27 mL/min — ABNORMAL LOW
GFR calc non Af Amer: 24 mL/min — ABNORMAL LOW
Glucose, Bld: 114 mg/dL — ABNORMAL HIGH (ref 65–99)
Potassium: 2.9 mmol/L — ABNORMAL LOW (ref 3.5–5.1)
Sodium: 148 mmol/L — ABNORMAL HIGH (ref 135–145)

## 2015-12-08 LAB — CULTURE, BLOOD (ROUTINE X 2)

## 2015-12-08 LAB — CBC
HEMATOCRIT: 23.9 % — AB (ref 36.0–46.0)
HEMOGLOBIN: 7.8 g/dL — AB (ref 12.0–15.0)
MCH: 27.7 pg (ref 26.0–34.0)
MCHC: 32.6 g/dL (ref 30.0–36.0)
MCV: 84.8 fL (ref 78.0–100.0)
Platelets: 156 10*3/uL (ref 150–400)
RBC: 2.82 MIL/uL — ABNORMAL LOW (ref 3.87–5.11)
RDW: 16.3 % — AB (ref 11.5–15.5)
WBC: 16.2 10*3/uL — ABNORMAL HIGH (ref 4.0–10.5)

## 2015-12-08 LAB — LEGIONELLA ANTIGEN, URINE

## 2015-12-08 LAB — GLUCOSE, CAPILLARY: Glucose-Capillary: 102 mg/dL — ABNORMAL HIGH (ref 65–99)

## 2015-12-08 LAB — PROTIME-INR
INR: 1.98 — AB (ref 0.00–1.49)
Prothrombin Time: 22.4 seconds — ABNORMAL HIGH (ref 11.6–15.2)

## 2015-12-08 MED ORDER — POTASSIUM CHLORIDE 10 MEQ/100ML IV SOLN
10.0000 meq | INTRAVENOUS | Status: AC
  Administered 2015-12-08 (×3): 10 meq via INTRAVENOUS
  Filled 2015-12-08 (×3): qty 100

## 2015-12-08 NOTE — Progress Notes (Signed)
Pharmacy Antibiotic Note  Sabrina Long is a 80 y.o. female admitted on 12/05/2015 with sepsis.  Patient is currently on day 4 Zosyn for GNR bacteremia  Today, 12/08/2015: Temp: afebrile since 2/13 WBC: elevated, but improved since 2/12 Renal: AKI, improved today but still with CrCl < 20 CG  Plan:  E coli sensitivities pending; continue Zosyn for now  F/u de-escalation per culture results  Height:  (162.6 cm) Weight: 113 lb 3.2 oz (51.347 kg) IBW/kg (Calculated) : 54.7  Temp (24hrs), Avg:99 F (37.2 C), Min:98.8 F (37.1 C), Max:99.1 F (37.3 C)   Recent Labs Lab 12/05/15 1450  12/05/15 2103 12/05/15 2359 12/06/15 0318 12/06/15 0729 12/06/15 1010 12/08/15 0507  WBC 33.7*  --   --   --   --   --   --  16.2*  CREATININE 2.51*  --   --   --  2.66*  --   --  1.95*  LATICACIDVEN  --   < > 3.9* 5.2* 4.2* 2.6* 1.7  --   < > = values in this interval not displayed.  Estimated Creatinine Clearance: 19.6 mL/min (by C-G formula based on Cr of 1.95).    Allergies  Allergen Reactions  . Risperdal [Risperidone] Other (See Comments)    sedation    Antimicrobials this admission: Vanc 2/12 >> 2/14 Zosyn 2/12 >>  Levels/dose changes this admission: ---  Microbiology results: 06/2015: UCx with enterococcus S-Amp > 100k 2/12 BCx: 2/2 E coli; sens pending 2/13 UCx: 40k E coli sens pending  Flu panel: neg S. pneumo UAg: neg Legionella UAg: IP  Thank you for allowing pharmacy to be a part of this patient's care.  Bernadene Person, PharmD, BCPS Pager: [phone] 12/08/2015, 11:21 AM

## 2015-12-08 NOTE — Progress Notes (Signed)
Patient ID: Sabrina Long, female   DOB: [birth year], 80 y.o.   MRN: [id number] [facility] PROGRESS NOTE  Sabrina Long FHQ:[id number] DOB: 1936/09/02 DOA: 12/05/2015 PCP: Sabrina Kinnier, DO  Brief narrative:    80 year old female with past medical history of PE, DVT (on AC with coamdin), dementia, hypertension who presented to [facility] ED with reports per her family of vomiting and poor PO intake for past few days prior to this admission.   In ED, her BP was 89/53 and she is receiving IV fluids, her 2nd L NS and repeat BP 110/60.Blood work showed WBC count 33.7, hgb 9.3, potassium 5.3, creatinine 2.51 (baseline WNL), lactic acid 4.79 and procalcitonin more than 175. CXR showed left lower lobe bronchopneumonia or aspiration pneumonia. Sepsis work up initiated. Started on vanco and zosyn.  Hospital course complicated with gram-negative rod bacteremia due to Escherichia coli.   Assessment/Plan:    Principal Problem:  Sepsis due to pneumonia ([place])  and E.coli bacteremia / Leukocytosis  - Sepsis criteria met on admission with T 101 F, HR 124, RR 27, hypotension, lactic acid 4.79, procalcitonin more than 175.  - Source of infection suspected bronchopneumonia, aspiration pneumonitis as well as Escherichia coli bacteremia - Stopped vancomycin today and continue Zosyn - Legionella and influenza are negative - Respiratory status stable  Active Problems:   Dementia with behavioral disturbance / Acute encephalopathy / Alzheimer's disease - Stable    Bilateral pulmonary embolism (HCC) / LLE DVT - All anticoagulation on hold because of intermittent hematuria   Essential hypertension - Blood pressure stable while off all blood pressure medications   Protein-calorie malnutrition, severe ([place]) - In the context of chronic illness, worsening dementia   Hyperkalemia / hypokalemia - Likely due to sepsis - Repeat level normalized but now hypokalemic so we supplemented it  today.   Seizures (HCC) - Keppra 500 mg Q 12 hours IV   Acute kidney injury ([place]) - Likely secondary to sepsis. Per chart review previous creatinine levels were all within normal limits - Creatinine 2.5 on the admission and further up to 2.6 - Now it seems to be improving and slightly down this morning, 1.95    Pressure ulcers, left hip - Pressure ulcers in different stages 3, 4 and unstageable - Wound care assessment completed  DVT Prophylaxis  - Coumadin on hold  - At this point it is reasonable to continue to use SCDs for DVT prophylaxis   Code Status: DNR/DNI Family Communication:  Family not at the bedside Disposition Plan: We'll see if family agreeable to residential hospice placement. Anticipate discharge by 12/10/2015   IV access:  Peripheral IV  Procedures and diagnostic studies:    Dg Chest 2 View 12/05/2015  1. Left lower lobe bronchopneumonia or aspiration pneumonia suspected. 2. Suboptimal inspiration which accounts for atelectasis at the right lung base. 3. Stable mild cardiomegaly without pulmonary edema.   Medical Consultants:  None   Other Consultants:  PT Nutrition  IAnti-Infectives:   Vancomycin 12/04/2005 --> 12/08/2015 Zosyn 12/08/2015-->   Leisa Lenz, MD  [facility] Pager [phone]  Time spent in minutes: 25 minutes  If 7PM-7AM, please contact night-coverage [email or web address] Password [facility] 12/08/2015, 11:58 AM   LOS: 3 days    HPI/Subjective: No acute overnight events. Minimally opens eyes to painful stimulation.  Objective: Filed Vitals:   12/07/15 0503 12/07/15 1434 12/07/15 2245 12/08/15 0455  BP: 108/77 146/81 121/77 131/70  Pulse: 113 115 88 82  Temp: 98.9 F (37.2 C)  99 F (37.2 C) 99.1 F (37.3 C) 98.8 F (37.1 C)  TempSrc: Axillary Oral Axillary Axillary  Resp: 20  18 20   Height:      Weight: 54.296 kg (119 lb 11.2 oz)   51.347 kg (113 lb 3.2 oz)  SpO2: 98% 99% 100% 99%    Intake/Output Summary (Last 24  hours) at 12/08/15 1158 Last data filed at 12/08/15 1050  Gross per 24 hour  Intake 1335.83 ml  Output    575 ml  Net 760.83 ml    Exam:   General:  Pt minimally responsive to painful stimuli, sternal  Cardiovascular: RRR, (+) S1, S2   Respiratory: Diminished, no wheezing  Abdomen: Appreciate bowel sounds, nondistended abdomen  Extremities: No edema, palpable pulses  Neuro: Nonfocal  Data Reviewed: Basic Metabolic Panel:  Recent Labs Lab 12/05/15 1450 12/06/15 0318 12/08/15 0507  NA 141 143 148*  K 5.3* 4.5 2.9*  CL 108 111 120*  CO2 18* 16* 16*  GLUCOSE 152* 91 114*  BUN 57* 59* 43*  CREATININE 2.51* 2.66* 1.95*  CALCIUM 9.2 7.7* 7.7*   Liver Function Tests:  Recent Labs Lab 12/05/15 1450 12/06/15 0318  AST 31 27  ALT 26 20  ALKPHOS 71 66  BILITOT 0.4 0.5  PROT 8.3* 6.7  ALBUMIN 3.1* 2.6*   No results for input(s): LIPASE, AMYLASE in the last 168 hours. No results for input(s): AMMONIA in the last 168 hours. CBC:  Recent Labs Lab 12/05/15 1450 12/06/15 0005 12/08/15 0507  WBC 33.7*  --  16.2*  HGB 9.3* 9.3* 7.8*  HCT 29.7* 29.7* 23.9*  MCV 87.1  --  84.8  PLT 325  --  156   Cardiac Enzymes: No results for input(s): CKTOTAL, CKMB, CKMBINDEX, TROPONINI in the last 168 hours. BNP: Invalid input(s): POCBNP CBG:  Recent Labs Lab 12/06/15 0747 12/07/15 0759 12/07/15 0856 12/08/15 0752  GLUCAP 85 57* 135* 102*   Results for orders placed or performed during the hospital encounter of 12/05/15  Blood Culture (routine x 2)     Status: None   Collection Time: 12/05/15  2:50 PM  Result Value Ref Range Status   Specimen Description BLOOD RIGHT ANTECUBITAL  Final   Special Requests BOTTLES DRAWN AEROBIC AND ANAEROBIC 5CC EA  Final   Culture  Setup Time   Final    GRAM NEGATIVE RODS IN BOTH AEROBIC AND ANAEROBIC BOTTLES CRITICAL RESULT CALLED TO, READ BACK BY AND VERIFIED WITH: L. VERGELDEDIOS,RN AT [id number] ON [id number] BY S. YARBROUGH     Culture   Final    ESCHERICHIA COLI SUSCEPTIBILITIES PERFORMED ON PREVIOUS CULTURE WITHIN THE LAST 5 DAYS. Performed at [facility]    Report Status 12/08/2015 FINAL  Final  Blood Culture (routine x 2)     Status: None   Collection Time: 12/05/15  3:30 PM  Result Value Ref Range Status   Specimen Description BLOOD BLOOD RIGHT FOREARM  Final   Special Requests BOTTLES DRAWN AEROBIC AND ANAEROBIC 10CC EA  Final   Culture  Setup Time   Final    GRAM NEGATIVE RODS IN BOTH AEROBIC AND ANAEROBIC BOTTLES CRITICAL RESULT CALLED TO, READ BACK BY AND VERIFIED WITH: Alessandra Bevels [id number] [place]    Culture   Final    ESCHERICHIA COLI Performed at [facility]    Report Status 12/08/2015 FINAL  Final   Organism ID, Bacteria ESCHERICHIA COLI  Final      Susceptibility   Escherichia coli -  MIC*    AMPICILLIN <=2 SENSITIVE Sensitive     CEFAZOLIN <=4 SENSITIVE Sensitive     CEFEPIME <=1 SENSITIVE Sensitive     CEFTAZIDIME <=1 SENSITIVE Sensitive     CEFTRIAXONE <=1 SENSITIVE Sensitive     CIPROFLOXACIN <=0.25 SENSITIVE Sensitive     GENTAMICIN <=1 SENSITIVE Sensitive     IMIPENEM <=0.25 SENSITIVE Sensitive     TRIMETH/SULFA <=20 SENSITIVE Sensitive     AMPICILLIN/SULBACTAM <=2 SENSITIVE Sensitive     PIP/TAZO <=4 SENSITIVE Sensitive     * ESCHERICHIA COLI  Urine culture     Status: None (Preliminary result)   Collection Time: 12/06/15  8:44 PM  Result Value Ref Range Status   Specimen Description URINE, CATHETERIZED  Final   Special Requests NONE  Final   Culture   Final    40,000 COLONIES/ml ESCHERICHIA COLI Performed at [facility]    Report Status PENDING  Incomplete     Scheduled Meds: . antiseptic oral rinse  7 mL Mouth Rinse BID  . feeding supplement (ENSURE ENLIVE)  237 mL Oral TID BM  . feeding supplement (PRO-STAT SUGAR FREE 64)  30 mL Oral BID  . levETIRAcetam  500 mg Intravenous BID  . piperacillin-tazobactam (ZOSYN)  IV  2.25 g  Intravenous 4 times per day  . potassium chloride  10 mEq Intravenous Q1 Hr x 3  . sodium chloride flush  3 mL Intravenous Q12H   Continuous Infusions: . dextrose 50 mL/hr at 12/08/15 0430

## 2015-12-08 NOTE — Care Management Important Message (Signed)
Important Message  Patient Details  Name: TYNSLEE BOWLDS MRN: [id number] Date of Birth: 05-Nov-1935   Medicare Important Message Given:  Yes    Haskell Flirt 12/08/2015, 11:33 AMImportant Message  Patient Details  Name: SHONTA BOURQUE MRN: [id number] Date of Birth: 05-12-1936   Medicare Important Message Given:  Yes    Haskell Flirt 12/08/2015, 11:33 AM

## 2015-12-09 LAB — URINE CULTURE: Culture: 40000

## 2015-12-09 LAB — BASIC METABOLIC PANEL
Anion gap: 11 (ref 5–15)
BUN: 35 mg/dL — AB (ref 6–20)
CO2: 18 mmol/L — ABNORMAL LOW (ref 22–32)
CREATININE: 1.61 mg/dL — AB (ref 0.44–1.00)
Calcium: 8.1 mg/dL — ABNORMAL LOW (ref 8.9–10.3)
Chloride: 118 mmol/L — ABNORMAL HIGH (ref 101–111)
GFR calc Af Amer: 34 mL/min — ABNORMAL LOW (ref >60)
GFR, EST NON AFRICAN AMERICAN: 30 mL/min — AB (ref >60)
GLUCOSE: 129 mg/dL — AB (ref 65–99)
POTASSIUM: 2.8 mmol/L — AB (ref 3.5–5.1)
SODIUM: 147 mmol/L — AB (ref 135–145)

## 2015-12-09 LAB — CBC
HCT: 23.7 % — ABNORMAL LOW (ref 36.0–46.0)
Hemoglobin: 7.7 g/dL — ABNORMAL LOW (ref 12.0–15.0)
MCH: 27.7 pg (ref 26.0–34.0)
MCHC: 32.5 g/dL (ref 30.0–36.0)
MCV: 85.3 fL (ref 78.0–100.0)
PLATELETS: 168 10*3/uL (ref 150–400)
RBC: 2.78 MIL/uL — ABNORMAL LOW (ref 3.87–5.11)
RDW: 16.5 % — ABNORMAL HIGH (ref 11.5–15.5)
WBC: 10 10*3/uL (ref 4.0–10.5)

## 2015-12-09 LAB — GLUCOSE, CAPILLARY: GLUCOSE-CAPILLARY: 113 mg/dL — AB (ref 65–99)

## 2015-12-09 MED ORDER — POTASSIUM CHLORIDE 10 MEQ/100ML IV SOLN
10.0000 meq | INTRAVENOUS | Status: AC
Start: 2015-12-09 — End: 2015-12-09
  Administered 2015-12-09 (×3): 10 meq via INTRAVENOUS
  Filled 2015-12-09 (×2): qty 100

## 2015-12-09 MED ORDER — PIPERACILLIN-TAZOBACTAM 3.375 G IVPB: 3.3750 g | Freq: Four times a day (QID) | INTRAVENOUS | Status: DC

## 2015-12-09 MED ORDER — POTASSIUM CHLORIDE 10 MEQ/100ML IV SOLN
INTRAVENOUS | Status: AC
  Administered 2015-12-09: 10 meq via INTRAVENOUS
  Filled 2015-12-09: qty 100

## 2015-12-09 MED ORDER — PIPERACILLIN-TAZOBACTAM 3.375 G IVPB
3.3750 g | Freq: Three times a day (TID) | INTRAVENOUS | Status: DC
  Administered 2015-12-09 – 2015-12-10 (×2): 3.375 g via INTRAVENOUS
  Filled 2015-12-09 (×3): qty 50

## 2015-12-09 NOTE — Progress Notes (Signed)
Pharmacy Antibiotic Note  Sabrina Long is a 80 y.o. female admitted on 12/05/2015 with sepsis.  Patient is currently on day 4 Zosyn for GNR bacteremia  Today, 12/09/2015: Temp: afebrile since 2/13 WBC: elevated, but improved since 2/12 Renal: AKI, improved today; CrCl 24 ml/min; UOP excellent  Plan:  After discussion with Dr. Elisabeth Pigeon, Zosyn will be continued despite culture results.  Increase Zosyn to 3.375 g IV q8 hr by extended infusion for improved renal function.  F/u de-escalation as appropriate  Height:  (162.6 cm) Weight: 115 lb 8.3 oz (52.4 kg) IBW/kg (Calculated) : 54.7  Temp (24hrs), Avg:98.3 F (36.8 C), Min:98.2 F (36.8 C), Max:98.4 F (36.9 C)   Recent Labs Lab 12/05/15 1450  12/05/15 2103 12/05/15 2359 12/06/15 0318 12/06/15 0729 12/06/15 1010 12/08/15 0507 12/09/15 0438  WBC 33.7*  --   --   --   --   --   --  16.2* 10.0  CREATININE 2.51*  --   --   --  2.66*  --   --  1.95* 1.61*  LATICACIDVEN  --   < > 3.9* 5.2* 4.2* 2.6* 1.7  --   --   < > = values in this interval not displayed.  Estimated Creatinine Clearance: 24.2 mL/min (by C-G formula based on Cr of 1.61).    Allergies  Allergen Reactions  . Risperdal [Risperidone] Other (See Comments)    sedation    Antimicrobials this admission: Vanc 2/12 >> 2/14 Zosyn 2/12 >> ---  Levels/dose changes this admission: ---  Microbiology results: 06/2015: UCx with enterococcus S-Amp > 100k 2/12 BCx: 2/2 E coli; pansens 2/13 UCx: 40k E coli pansens Flu panel: neg S. pneumo UAg: neg Legionella UAg: neg  Thank you for allowing pharmacy to be a part of this patient's care.  Bernadene Person, PharmD, BCPS Pager: [phone] 12/09/2015, 12:39 PM

## 2015-12-09 NOTE — Progress Notes (Signed)
Patient ID: Sabrina STOFER, female   DOB: 06-Mar-1936, 80 y.o.   MRN: [id number] [facility] PROGRESS NOTE  YANISSA MICHALSKY UXN:[id number] DOB: 1935-12-01 DOA: 12/05/2015 PCP: Hollace Kinnier, DO  Brief narrative:    80 year old female with past medical history of PE, DVT (on AC with coamdin), dementia, hypertension who presented to [facility] ED with reports per her family of vomiting and poor PO intake for past few days prior to this admission.   In ED, her BP was 89/53 and she is receiving IV fluids, her 2nd L NS and repeat BP 110/60.Blood work showed WBC count 33.7, hgb 9.3, potassium 5.3, creatinine 2.51 (baseline WNL), lactic acid 4.79 and procalcitonin more than 175. CXR showed left lower lobe bronchopneumonia or aspiration pneumonia. Sepsis work up initiated. Started on vanco and zosyn.  Hospital course complicated with Escherichia coli bacteremia.  Assessment/Plan:    Principal Problem:  Sepsis due to pneumonia ([place])  and E.coli bacteremia / Leukocytosis  - Sepsis criteria met on admission with T 101 F, HR 124, RR 27, hypotension, lactic acid 4.79, procalcitonin more than 175.  - Source of infection suspected bronchopneumonia, aspiration pneumonitis as well as Escherichia coli bacteremia. Blood cultures on admission growing E.Coli  - Stopped vancomycin 2/15 and we will continue Zosyn - Legionella and influenza are negative - Leukocytosis now resolved   Active Problems:   Dementia with behavioral disturbance / Acute encephalopathy / Alzheimer's disease - Stable    Bilateral pulmonary embolism (HCC) / LLE DVT - All anticoagulation on hold because of intermittent hematuria    Anemia of chronic disease - Hemoglobin stable at 7.7' - Transfuse if Hgb less than 7    Essential hypertension - Blood pressure stable   Protein-calorie malnutrition, severe (HCC) - In the context of chronic illness, worsening dementia   Hyperkalemia / hypokalemia - Likely due to  sepsis - Now hypokalemia so will continue to supplement    Seizures (HCC) - Keppra 500 mg Q 12 hours IV - No reports of seizures    Acute kidney injury ([place]) - Likely secondary to sepsis. Per chart review previous creatinine levels were all within normal limits - Creatinine 2.5 on the admission and further up to 2.6 - Cr better with hydration, today 1.6    Pressure ulcers, left hip - Pressure ulcers in different stages 3, 4 and unstageable - Appreciate WOC assessment   DVT Prophylaxis  - SCD's bilaterally    Code Status: DNR/DNI Family Communication:  Family not at the bedside, spoke with her son over the phone  Disposition Plan: 2/20 home  IV access:  Peripheral IV  Procedures and diagnostic studies:    Dg Chest 2 View 12/05/2015  1. Left lower lobe bronchopneumonia or aspiration pneumonia suspected. 2. Suboptimal inspiration which accounts for atelectasis at the right lung base. 3. Stable mild cardiomegaly without pulmonary edema.   Medical Consultants:  None   Other Consultants:  PT Nutrition  IAnti-Infectives:   Vancomycin 12/04/2005 --> 12/08/2015 Zosyn 12/08/2015-->   Leisa Lenz, MD  [facility] Pager [phone]  Time spent in minutes: 25 minutes  If 7PM-7AM, please contact night-coverage [email or web address] Password [email or web address] 12/09/2015, 11:08 AM   LOS: 4 days    HPI/Subjective: No acute overnight events. Not responding to verbal stimuli.   Objective: Filed Vitals:   12/08/15 1443 12/08/15 1523 12/08/15 2222 12/09/15 0600  BP: 162/98 145/69 139/77 149/92  Pulse: 89 88 84 71  Temp:   98.4 F (36.9 C) 98.2 F (  36.8 C)  TempSrc:   Axillary Axillary  Resp: 22 20 18 18   Height:      Weight:    52.4 kg (115 lb 8.3 oz)  SpO2: 97%  99% 100%    Intake/Output Summary (Last 24 hours) at 12/09/15 1108 Last data filed at 12/09/15 1000  Gross per 24 hour  Intake   1630 ml  Output   1450 ml  Net    180 ml    Exam:   General:  Pt not in  distress  Cardiovascular: Rate controlled, appreciate S1, S2   Respiratory: Diminished without rhonchi, no wheezing   Abdomen: non tender, non distended, (+) BS  Extremities: No swelling, palpable pulses   Neuro: No focal deficits   Data Reviewed: Basic Metabolic Panel:  Recent Labs Lab 12/05/15 1450 12/06/15 0318 12/08/15 0507 12/09/15 0438  NA 141 143 148* 147*  K 5.3* 4.5 2.9* 2.8*  CL 108 111 120* 118*  CO2 18* 16* 16* 18*  GLUCOSE 152* 91 114* 129*  BUN 57* 59* 43* 35*  CREATININE 2.51* 2.66* 1.95* 1.61*  CALCIUM 9.2 7.7* 7.7* 8.1*   Liver Function Tests:  Recent Labs Lab 12/05/15 1450 12/06/15 0318  AST 31 27  ALT 26 20  ALKPHOS 71 66  BILITOT 0.4 0.5  PROT 8.3* 6.7  ALBUMIN 3.1* 2.6*   No results for input(s): LIPASE, AMYLASE in the last 168 hours. No results for input(s): AMMONIA in the last 168 hours. CBC:  Recent Labs Lab 12/05/15 1450 12/06/15 0005 12/08/15 0507 12/09/15 0438  WBC 33.7*  --  16.2* 10.0  HGB 9.3* 9.3* 7.8* 7.7*  HCT 29.7* 29.7* 23.9* 23.7*  MCV 87.1  --  84.8 85.3  PLT 325  --  156 168   Cardiac Enzymes: No results for input(s): CKTOTAL, CKMB, CKMBINDEX, TROPONINI in the last 168 hours. BNP: Invalid input(s): POCBNP CBG:  Recent Labs Lab 12/06/15 0747 12/07/15 0759 12/07/15 0856 12/08/15 0752 12/09/15 0734  GLUCAP 85 57* 135* 102* 113*   Blood Culture (routine x 2)     Status: None   Collection Time: 12/05/15  2:50 PM  Result Value Ref Range Status   Specimen Description BLOOD RIGHT ANTECUBITAL  Final   Culture  Setup Time   Final   Culture   Final    ESCHERICHIA COLI    Report Status 12/08/2015 FINAL  Final  Blood Culture (routine x 2)     Status: None   Collection Time: 12/05/15  3:30 PM  Result Value Ref Range Status   Specimen Description BLOOD BLOOD RIGHT FOREARM  Final   Culture  Setup Time   Final   Culture   Final    ESCHERICHIA COLI    Report Status 12/08/2015 FINAL  Final   Organism ID,  Bacteria ESCHERICHIA COLI  Final      Susceptibility   Escherichia coli - MIC*    AMPICILLIN <=2 SENSITIVE Sensitive     CEFAZOLIN <=4 SENSITIVE Sensitive     CEFEPIME <=1 SENSITIVE Sensitive     CEFTAZIDIME <=1 SENSITIVE Sensitive     CEFTRIAXONE <=1 SENSITIVE Sensitive     CIPROFLOXACIN <=0.25 SENSITIVE Sensitive     GENTAMICIN <=1 SENSITIVE Sensitive     IMIPENEM <=0.25 SENSITIVE Sensitive     TRIMETH/SULFA <=20 SENSITIVE Sensitive     AMPICILLIN/SULBACTAM <=2 SENSITIVE Sensitive     PIP/TAZO <=4 SENSITIVE Sensitive     * ESCHERICHIA COLI  Urine culture     Status:  None   Collection Time: 12/06/15  8:44 PM  Result Value Ref Range Status   Specimen Description URINE, CATHETERIZED  Final   Special Requests NONE  Final   Culture   Final    40,000 COLONIES/ml ESCHERICHIA COLI    Report Status 12/09/2015 FINAL  Final   Organism ID, Bacteria ESCHERICHIA COLI  Final      Susceptibility   Escherichia coli - MIC*    AMPICILLIN <=2 SENSITIVE Sensitive     CEFAZOLIN <=4 SENSITIVE Sensitive     CEFTRIAXONE <=1 SENSITIVE Sensitive     CIPROFLOXACIN <=0.25 SENSITIVE Sensitive     GENTAMICIN <=1 SENSITIVE Sensitive     IMIPENEM <=0.25 SENSITIVE Sensitive     NITROFURANTOIN <=16 SENSITIVE Sensitive     TRIMETH/SULFA <=20 SENSITIVE Sensitive     AMPICILLIN/SULBACTAM <=2 SENSITIVE Sensitive     PIP/TAZO <=4 SENSITIVE Sensitive     * 40,000 COLONIES/ml ESCHERICHIA COLI     Scheduled Meds: . antiseptic oral rinse  7 mL Mouth Rinse BID  . feeding supplement (ENSURE ENLIVE)  237 mL Oral TID BM  . feeding supplement (PRO-STAT SUGAR FREE 64)  30 mL Oral BID  . levETIRAcetam  500 mg Intravenous BID  . piperacillin-tazobactam (ZOSYN)  IV  2.25 g Intravenous 4 times per day  . potassium chloride  10 mEq Intravenous Q1 Hr x 3  . sodium chloride flush  3 mL Intravenous Q12H   Continuous Infusions: . dextrose 50 mL/hr at 12/09/15 [phone]

## 2015-12-09 NOTE — Progress Notes (Signed)
Nutrition Follow-up  DOCUMENTATION CODES:   Not applicable  INTERVENTION:  - Diet advancement per SLP evaluation following swallow evaluation - Continue Ensure Enlive TID and Prostat BID and will adjust if needed - Tech/RN to provide meal assistance - If pt found to be unsafe for PO intake and if TF is within POC/GOC, recommend Jevity 1.2 @ 60 mL/hr with 30 mL Prostat once/day which will provide 1828 kcal, 95 grams of protein, and 1162 mL free water - RD will continue to monitor for needs  NUTRITION DIAGNOSIS:   Increased nutrient needs related to wound healing as evidenced by estimated needs. -ongoing  GOAL:   Patient will meet greater than or equal to 90% of their needs -unmet  MONITOR:   Diet advancement, PO intake, Supplement acceptance, Weight trends, Labs, Skin, I & O's  ASSESSMENT:   80 year old female with past medical history of PE, DVT (on AC with coamdin), dementia, hypertension who presented to [facility] ED with reports per her family of vomiting and poor PO intake for past few days prior to this admission. Due to pt dementia she is not able to provide details of medical history and her family is not at the bedside to give further details.  2/16 Per chart review, pt has consumed 0% of all meals since previous assessment. Pt sleeping at time of visit with no family/visitors present; did not want to arouse pt so continue to defer physical assessment at this time. Ensure Enlive on bedside table with 2/3 completion. Spoke with tech who reports that pt is NPO at this time pending swallow evaluation and due to this breakfast was not provided to pt. No notes in chart concerning swallow evaluation or order for the same so will continue to monitor for this.   Not meeting needs. TF recommendations outlined above should they be required. Medications reviewed. Labs reviewed; CBGs: 57-135 mg/dL, Na: 161 mmol/L, Cl: 096 mmol/L, K: 2.8 mmol/L, BUN/creatinine elevated but trending down, Ca: 8.1  mg/dL, EAV:40.    9/81 - Pt asleep with family at bedside. - Pt is nonverbal so all history was obtained from family.  - Per family, pt was eating well and was eating anything she wanted prior to developing vomiting for 1 day PTA.  - Family denies any chewing or swallowing issues PTA.  - Pt did not eat any breakfast this morning d/t lethargy.  - SLP to evaluate.  - Family reports pt last ate on 2/11, she had soup, crackers and ginger ale.  - Family is amenable to RD ordering Prostat supplement for additional protein.  - Pt with Stg III and IV pressure ulcers. - Per weight history, pt has gained weight recently. In 2015, her weight ranged from 135-145 lb.  - Unable to perform NFPE as pt asleep and family did not want her disturbed.   Diet Order:  Diet Heart Room service appropriate?: Yes; Fluid consistency:: Thin  Skin:  Wound (see comment) (Stage IV hip and Sage III coccyx ulcers)  Last BM:  2/14  Height:   Ht Readings from Last 1 Encounters:  12/05/15  (1.626 m)    Weight:   Wt Readings from Last 1 Encounters:  12/09/15 115 lb 8.3 oz (52.4 kg)    Ideal Body Weight:  54.5 kg  BMI:  Body mass index is 19.82 kg/(m^2).  Estimated Nutritional Needs:   Kcal:  1700-1900  Protein:  85-95g  Fluid:  1.7-1.9L/day  EDUCATION NEEDS:   No education needs identified at  this time     Jarome Matin, RD, LDN Inpatient Clinical Dietitian Pager # [phone] After hours/weekend pager # [phone]

## 2015-12-10 LAB — CBC
HEMATOCRIT: 26.2 % — AB (ref 36.0–46.0)
Hemoglobin: 8.3 g/dL — ABNORMAL LOW (ref 12.0–15.0)
MCH: 27.5 pg (ref 26.0–34.0)
MCHC: 31.7 g/dL (ref 30.0–36.0)
MCV: 86.8 fL (ref 78.0–100.0)
Platelets: 199 10*3/uL (ref 150–400)
RBC: 3.02 MIL/uL — AB (ref 3.87–5.11)
RDW: 16.4 % — AB (ref 11.5–15.5)
WBC: 10.1 10*3/uL (ref 4.0–10.5)

## 2015-12-10 LAB — BASIC METABOLIC PANEL
ANION GAP: 12 (ref 5–15)
BUN: 30 mg/dL — AB (ref 6–20)
CO2: 18 mmol/L — ABNORMAL LOW (ref 22–32)
Calcium: 8.6 mg/dL — ABNORMAL LOW (ref 8.9–10.3)
Chloride: 114 mmol/L — ABNORMAL HIGH (ref 101–111)
Creatinine, Ser: 1.27 mg/dL — ABNORMAL HIGH (ref 0.44–1.00)
GFR, EST AFRICAN AMERICAN: 46 mL/min — AB (ref >60)
GFR, EST NON AFRICAN AMERICAN: 40 mL/min — AB (ref >60)
Glucose, Bld: 190 mg/dL — ABNORMAL HIGH (ref 65–99)
POTASSIUM: 3 mmol/L — AB (ref 3.5–5.1)
SODIUM: 144 mmol/L (ref 135–145)

## 2015-12-10 LAB — GLUCOSE, CAPILLARY: GLUCOSE-CAPILLARY: 103 mg/dL — AB (ref 65–99)

## 2015-12-10 MED ORDER — DEXTROSE 5 % IV SOLN
2.0000 g | INTRAVENOUS | Status: DC
  Administered 2015-12-10 – 2015-12-12 (×3): 2 g via INTRAVENOUS
  Filled 2015-12-10 (×4): qty 2

## 2015-12-10 NOTE — Progress Notes (Signed)
Patient ID: Sabrina Long, female   DOB: 10-09-1936, 80 y.o.   MRN: [id number] [facility] PROGRESS NOTE  Sabrina Long GYJ:[id number] DOB: 1936/05/13 DOA: 12/05/2015 PCP: Hollace Kinnier, DO  Brief narrative:    80 year old female with past medical history of PE, DVT (on AC with coamdin), dementia, hypertension who presented to [facility] ED with reports per her family of vomiting and poor PO intake for past few days prior to this admission.   In ED, her BP was 89/53 and she is receiving IV fluids, her 2nd L NS and repeat BP 110/60.Blood work showed WBC count 33.7, hgb 9.3, potassium 5.3, creatinine 2.51 (baseline WNL), lactic acid 4.79 and procalcitonin more than 175. CXR showed left lower lobe bronchopneumonia or aspiration pneumonia. Sepsis work up initiated. Started on vanco and zosyn.  Hospital course complicated with Escherichia coli bacteremia.  Assessment/Plan:    Principal Problem:  Sepsis due to pneumonia ([place])  and E.coli bacteremia / Leukocytosis  - Sepsis criteria met on admission with T 101 F, HR 124, RR 27, hypotension, lactic acid 4.79, procalcitonin more than 175.  - Source of infection suspected bronchopneumonia, aspiration pneumonitis as well as Escherichia coli bacteremia. Blood cultures on admission growing E.Coli  - Stopped vancomycin 2/15 and we will stop zosyn today and start rocephin for targeted treatment of e.coli bacteremia  - Legionella and influenza are negative - Leukocytosis resolved   Active Problems:   Dementia with behavioral disturbance / Acute encephalopathy / Alzheimer's disease - Stable    Bilateral pulmonary embolism (HCC) / LLE DVT - All anticoagulation on hold because of intermittent hematuria    Anemia of chronic disease - Hemoglobin stable at 7.7' - Transfuse if Hgb less than 7  - CBC this am pending    Essential hypertension - Blood pressure 142/72   Protein-calorie malnutrition, severe (HCC) - In the context of  chronic illness, worsening dementia   Hyperkalemia / hypokalemia - Likely due to sepsis - Check BMP today    Seizures (HCC) - Keppra 500 mg Q 12 hours IV   Acute kidney injury ([place]) - Likely secondary to sepsis. Per chart review previous creatinine levels were all within normal limits - Creatinine 2.5 on the admission and further up to 2.6 - Cr improving with hydration - Check BMP this am     Pressure ulcers, left hip - Pressure ulcers in different stages 3, 4 and unstageable - Appreciate WOC assessment   DVT Prophylaxis  - SCD's bilaterally in hospital    Code Status: DNR/DNI Family Communication:  Family not at the bedside, spoke with her son over the phone  Disposition Plan: 2/20 home  IV access:  Peripheral IV  Procedures and diagnostic studies:    Dg Chest 2 View 12/05/2015  1. Left lower lobe bronchopneumonia or aspiration pneumonia suspected. 2. Suboptimal inspiration which accounts for atelectasis at the right lung base. 3. Stable mild cardiomegaly without pulmonary edema.   Medical Consultants:  None   Other Consultants:  PT Nutrition  IAnti-Infectives:   Vancomycin 12/04/2005 --> 12/08/2015 Zosyn 12/08/2015--> 12/10/2015 Rocephin 11/24/2015 -->   Leisa Lenz, MD  [facility] Pager [phone]  Time spent in minutes: 25 minutes  If 7PM-7AM, please contact night-coverage [email or web address] Password [email or web address] 12/10/2015, 12:04 PM   LOS: 5 days    HPI/Subjective: No acute overnight events. Better this am, more alert.  Objective: Filed Vitals:   12/09/15 1256 12/09/15 2236 12/10/15 0220 12/10/15 0513  BP: 146/80 168/78 152/62 142/72  Pulse: 91 79 79 75  Temp: 98.4 F (36.9 C) 97.5 F (36.4 C)  97.5 F (36.4 C)  TempSrc: Axillary Axillary  Axillary  Resp: 18 16  18   Height:      Weight:    52.753 kg (116 lb 4.8 oz)  SpO2: 100% 99% 100% 100%    Intake/Output Summary (Last 24 hours) at 12/10/15 1204 Last data filed at 12/10/15 0700  Gross  per 24 hour  Intake 1511.67 ml  Output   1250 ml  Net 261.67 ml    Exam:   General:  Pt more alert this am  Cardiovascular: RRR, (+) S1, S2   Respiratory: Diminished, no wheezing, no rhonchi   Abdomen: (+) BS, non tender   Extremities: No edema, palpable pulses   Neuro: Nonfocal   Data Reviewed: Basic Metabolic Panel:  Recent Labs Lab 12/05/15 1450 12/06/15 0318 12/08/15 0507 12/09/15 0438  NA 141 143 148* 147*  K 5.3* 4.5 2.9* 2.8*  CL 108 111 120* 118*  CO2 18* 16* 16* 18*  GLUCOSE 152* 91 114* 129*  BUN 57* 59* 43* 35*  CREATININE 2.51* 2.66* 1.95* 1.61*  CALCIUM 9.2 7.7* 7.7* 8.1*   Liver Function Tests:  Recent Labs Lab 12/05/15 1450 12/06/15 0318  AST 31 27  ALT 26 20  ALKPHOS 71 66  BILITOT 0.4 0.5  PROT 8.3* 6.7  ALBUMIN 3.1* 2.6*   No results for input(s): LIPASE, AMYLASE in the last 168 hours. No results for input(s): AMMONIA in the last 168 hours. CBC:  Recent Labs Lab 12/05/15 1450 12/06/15 0005 12/08/15 0507 12/09/15 0438  WBC 33.7*  --  16.2* 10.0  HGB 9.3* 9.3* 7.8* 7.7*  HCT 29.7* 29.7* 23.9* 23.7*  MCV 87.1  --  84.8 85.3  PLT 325  --  156 168   Cardiac Enzymes: No results for input(s): CKTOTAL, CKMB, CKMBINDEX, TROPONINI in the last 168 hours. BNP: Invalid input(s): POCBNP CBG:  Recent Labs Lab 12/07/15 0759 12/07/15 0856 12/08/15 0752 12/09/15 0734 12/10/15 0735  GLUCAP 57* 135* 102* 113* 103*   Blood Culture (routine x 2)     Status: None   Collection Time: 12/05/15  2:50 PM  Result Value Ref Range Status   Specimen Description BLOOD RIGHT ANTECUBITAL  Final   Culture  Setup Time   Final   Culture   Final    ESCHERICHIA COLI    Report Status 12/08/2015 FINAL  Final  Blood Culture (routine x 2)     Status: None   Collection Time: 12/05/15  3:30 PM  Result Value Ref Range Status   Specimen Description BLOOD BLOOD RIGHT FOREARM  Final   Culture  Setup Time   Final   Culture   Final    ESCHERICHIA COLI     Report Status 12/08/2015 FINAL  Final   Organism ID, Bacteria ESCHERICHIA COLI  Final      Susceptibility   Escherichia coli - MIC*    AMPICILLIN <=2 SENSITIVE Sensitive     CEFAZOLIN <=4 SENSITIVE Sensitive     CEFEPIME <=1 SENSITIVE Sensitive     CEFTAZIDIME <=1 SENSITIVE Sensitive     CEFTRIAXONE <=1 SENSITIVE Sensitive     CIPROFLOXACIN <=0.25 SENSITIVE Sensitive     GENTAMICIN <=1 SENSITIVE Sensitive     IMIPENEM <=0.25 SENSITIVE Sensitive     TRIMETH/SULFA <=20 SENSITIVE Sensitive     AMPICILLIN/SULBACTAM <=2 SENSITIVE Sensitive     PIP/TAZO <=4 SENSITIVE Sensitive     *  ESCHERICHIA COLI  Urine culture     Status: None   Collection Time: 12/06/15  8:44 PM  Result Value Ref Range Status   Specimen Description URINE, CATHETERIZED  Final   Special Requests NONE  Final   Culture   Final    40,000 COLONIES/ml ESCHERICHIA COLI    Report Status 12/09/2015 FINAL  Final   Organism ID, Bacteria ESCHERICHIA COLI  Final      Susceptibility   Escherichia coli - MIC*    AMPICILLIN <=2 SENSITIVE Sensitive     CEFAZOLIN <=4 SENSITIVE Sensitive     CEFTRIAXONE <=1 SENSITIVE Sensitive     CIPROFLOXACIN <=0.25 SENSITIVE Sensitive     GENTAMICIN <=1 SENSITIVE Sensitive     IMIPENEM <=0.25 SENSITIVE Sensitive     NITROFURANTOIN <=16 SENSITIVE Sensitive     TRIMETH/SULFA <=20 SENSITIVE Sensitive     AMPICILLIN/SULBACTAM <=2 SENSITIVE Sensitive     PIP/TAZO <=4 SENSITIVE Sensitive     * 40,000 COLONIES/ml ESCHERICHIA COLI     Scheduled Meds: . antiseptic oral rinse  7 mL Mouth Rinse BID  . cefTRIAXone (ROCEPHIN)  IV  2 g Intravenous Q24H  . feeding supplement (ENSURE ENLIVE)  237 mL Oral TID BM  . feeding supplement (PRO-STAT SUGAR FREE 64)  30 mL Oral BID  . levETIRAcetam  500 mg Intravenous BID  . sodium chloride flush  3 mL Intravenous Q12H   Continuous Infusions: . dextrose 50 mL/hr at 12/09/15 1941

## 2015-12-10 NOTE — Care Management Note (Signed)
Case Management Note  Patient Details  Name: Sabrina Long MRN: [id number] Date of Birth: Jan 15, 1936  Subjective/Objective:       Pt admitted for Sepsis             Action/Plan:  Spoke with pt's son concerning discharge plan.  Plan is home with [facility].  Expected Discharge Date:                  Expected Discharge Plan:  Home w Home Health Services  In-House Referral:  Clinical Social Work  Discharge planning Services  CM Consult  Post Acute Care Choice:    Choice offered to:     DME Arranged:    DME Agency:     HH Arranged:    HH Agency:  [facility]  Status of Service:  In process, will continue to follow  Medicare Important Message Given:  Yes Date Medicare IM Given:    Medicare IM give by:    Date Additional Medicare IM Given:    Additional Medicare Important Message give by:     If discussed at Long Length of Stay Meetings, dates discussed:    Additional CommentsGeni Bers, RN 12/10/2015, 12:25 PM

## 2015-12-10 NOTE — Progress Notes (Signed)
Pharmacy Antibiotic Note  Sabrina Long is a 80 y.o. female admitted on 12/05/2015 with Ecoli Bacteremia and UTI.  Pharmacy has been consulted for Zosyn dosing.  Plan: After discussion with Dr. Elisabeth Pigeon, antibiotics will be changed to Ceftriaxone 2g IV q24h..  Follow up duration of therapy.   Height:  (162.6 cm) Weight: 116 lb 4.8 oz (52.753 kg) IBW/kg (Calculated) : 54.7  Temp (24hrs), Avg:97.8 F (36.6 C), Min:97.5 F (36.4 C), Max:98.4 F (36.9 C)   Recent Labs Lab 12/05/15 1450  12/05/15 2103 12/05/15 2359 12/06/15 0318 12/06/15 0729 12/06/15 1010 12/08/15 0507 12/09/15 0438  WBC 33.7*  --   --   --   --   --   --  16.2* 10.0  CREATININE 2.51*  --   --   --  2.66*  --   --  1.95* 1.61*  LATICACIDVEN  --   < > 3.9* 5.2* 4.2* 2.6* 1.7  --   --   < > = values in this interval not displayed.  Estimated Creatinine Clearance: 24.4 mL/min (by C-G formula based on Cr of 1.61).    Allergies  Allergen Reactions  . Risperdal [Risperidone] Other (See Comments)    sedation    Antimicrobials this admission: Vanc 2/12 >> 2/14 Zosyn 2/12 >> 2/17 Ceftriaxone 2/17 >>   Levels/dose changes this admission:  Microbiology results: 06/2015: UCx with enterococcus S-Amp > 100k 2/12 BCx: 2/2 E coli; pansens 2/13 UCx: 40k E coli pansens Flu panel: neg S. pneumo UAg: neg Legionella UAg: neg  Thank you for allowing pharmacy to be a part of this patient's care.  Lynann Beaver PharmD, BCPS Pager [phone] 12/10/2015 11:02 AM

## 2015-12-11 DIAGNOSIS — F0281 Dementia in other diseases classified elsewhere with behavioral disturbance: Secondary | ICD-10-CM

## 2015-12-11 LAB — BASIC METABOLIC PANEL WITH GFR
Anion gap: 10 (ref 5–15)
BUN: 27 mg/dL — ABNORMAL HIGH (ref 6–20)
CO2: 20 mmol/L — ABNORMAL LOW (ref 22–32)
Calcium: 8.9 mg/dL (ref 8.9–10.3)
Chloride: 112 mmol/L — ABNORMAL HIGH (ref 101–111)
Creatinine, Ser: 1.13 mg/dL — ABNORMAL HIGH (ref 0.44–1.00)
GFR calc Af Amer: 53 mL/min — ABNORMAL LOW
GFR calc non Af Amer: 46 mL/min — ABNORMAL LOW
Glucose, Bld: 182 mg/dL — ABNORMAL HIGH (ref 65–99)
Potassium: 3 mmol/L — ABNORMAL LOW (ref 3.5–5.1)
Sodium: 142 mmol/L (ref 135–145)

## 2015-12-11 LAB — CBC
HCT: 25.2 % — ABNORMAL LOW (ref 36.0–46.0)
Hemoglobin: 8.2 g/dL — ABNORMAL LOW (ref 12.0–15.0)
MCH: 28 pg (ref 26.0–34.0)
MCHC: 32.5 g/dL (ref 30.0–36.0)
MCV: 86 fL (ref 78.0–100.0)
PLATELETS: 220 10*3/uL (ref 150–400)
RBC: 2.93 MIL/uL — ABNORMAL LOW (ref 3.87–5.11)
RDW: 15.9 % — AB (ref 11.5–15.5)
WBC: 12.3 10*3/uL — ABNORMAL HIGH (ref 4.0–10.5)

## 2015-12-11 LAB — GLUCOSE, CAPILLARY: GLUCOSE-CAPILLARY: 110 mg/dL — AB (ref 65–99)

## 2015-12-11 MED ORDER — POTASSIUM CHLORIDE 10 MEQ/100ML IV SOLN
10.0000 meq | INTRAVENOUS | Status: AC
  Administered 2015-12-11 (×3): 10 meq via INTRAVENOUS
  Filled 2015-12-11 (×3): qty 100

## 2015-12-11 NOTE — Progress Notes (Signed)
Patient ID: Sabrina Long, female   DOB: 1936-06-23, 80 y.o.   MRN: [id number] [facility] PROGRESS NOTE  SETSUKO ROBINS VQQ:[id number] DOB: July 25, 1936 DOA: 12/05/2015 PCP: Hollace Kinnier, DO  Brief narrative:    80 year old female with past medical history of PE, DVT (on AC with coamdin), dementia, hypertension who presented to [facility] ED with reports per her family of vomiting and poor PO intake for past few days prior to this admission.   In ED, her BP was 89/53 and she is receiving IV fluids, her 2nd L NS and repeat BP 110/60.Blood work showed WBC count 33.7, hgb 9.3, potassium 5.3, creatinine 2.51 (baseline WNL), lactic acid 4.79 and procalcitonin more than 175. CXR showed left lower lobe bronchopneumonia or aspiration pneumonia. Sepsis work up initiated. Started on vanco and zosyn.  Hospital course complicated with Escherichia coli bacteremia.  Assessment/Plan:    Principal Problem:  Sepsis due to pneumonia ([place])  and E.coli bacteremia / Leukocytosis  - Sepsis criteria met on admission with T 101 F, HR 124, RR 27, hypotension, lactic acid 4.79, procalcitonin more than 175.  - Source of infection suspected bronchopneumonia, aspiration pneumonitis and Escherichia coli bacteremia. Blood cultures on admission growing E.Coli  - Stopped vancomycin 2/15 and zosyn 2/17 today and started rocephin per blood culture sens report - Legionella and influenza are negative - WBC count up this am 12.3 but she remains afebrile   Active Problems:   Dementia with behavioral disturbance / Acute encephalopathy / Alzheimer's disease - Stable    Bilateral pulmonary embolism (HCC) / LLE DVT - All anticoagulation on hold because of intermittent hematuria    Anemia of chronic disease - Hemoglobin stable at 8.2 - Transfuse if Hgb less than 7    Essential hypertension - As needed hydralazine ordered    Protein-calorie malnutrition, severe (HCC) - In the context of chronic illness,  worsening dementia - As tolerated    Hyperkalemia / hypokalemia - Likely due to sepsis - Slightly hypokalemia so supplement today as well    Seizures (HCC) - Keppra 500 mg Q 12 hours IV - No reports of seizures    Acute kidney injury ([place]) - Likely secondary to sepsis. Per chart review previous creatinine levels were all within normal limits - Cr improving with hydration    Pressure ulcers, left hip - Pressure ulcers in different stages 3, 4 and unstageable - WOC assessment done  DVT Prophylaxis  - SCD's bilaterally    Code Status: DNR/DNI Family Communication:  Family not at the bedside, spoke with her son over the phone  Disposition Plan: 2/20 home  IV access:  Peripheral IV  Procedures and diagnostic studies:    Dg Chest 2 View 12/05/2015  1. Left lower lobe bronchopneumonia or aspiration pneumonia suspected. 2. Suboptimal inspiration which accounts for atelectasis at the right lung base. 3. Stable mild cardiomegaly without pulmonary edema.   Medical Consultants:  None   Other Consultants:  PT Nutrition  IAnti-Infectives:   Vancomycin 12/04/2005 --> 12/08/2015 Zosyn 12/08/2015--> 12/10/2015 Rocephin 11/24/2015 -->   Leisa Lenz, MD  [facility] Pager [phone]  Time spent in minutes: 25 minutes  If 7PM-7AM, please contact night-coverage [email or web address] Password [facility] 12/11/2015, 12:44 PM   LOS: 6 days    HPI/Subjective: No acute overnight events. No respiratory distress.   Objective: Filed Vitals:   12/10/15 0513 12/10/15 1500 12/10/15 2147 12/11/15 0631  BP: 142/72 127/67 159/71 164/80  Pulse: 75 85 87 81  Temp: 97.5 F (  36.4 C) 98 F (36.7 C) 98.8 F (37.1 C) 97.7 F (36.5 C)  TempSrc: Axillary Axillary Oral Oral  Resp: 18 19 18 18   Height:      Weight: 52.753 kg (116 lb 4.8 oz)   52.3 kg (115 lb 4.8 oz)  SpO2: 100% 98% 100% 99%    Intake/Output Summary (Last 24 hours) at 12/11/15 1244 Last data filed at 12/11/15 0824  Gross  per 24 hour  Intake   2250 ml  Output   1025 ml  Net   1225 ml    Exam:   General:  Pt not in distress  Cardiovascular: Rate controlled, (+) S1, S2   Respiratory: Diminished, no rhonchi   Abdomen: non tender, non distended   Extremities: No swelling, palpable pulses   Neuro: No focal deficits   Data Reviewed: Basic Metabolic Panel:  Recent Labs Lab 12/06/15 0318 12/08/15 0507 12/09/15 0438 12/10/15 1238 12/11/15 0945  NA 143 148* 147* 144 142  K 4.5 2.9* 2.8* 3.0* 3.0*  CL 111 120* 118* 114* 112*  CO2 16* 16* 18* 18* 20*  GLUCOSE 91 114* 129* 190* 182*  BUN 59* 43* 35* 30* 27*  CREATININE 2.66* 1.95* 1.61* 1.27* 1.13*  CALCIUM 7.7* 7.7* 8.1* 8.6* 8.9   Liver Function Tests:  Recent Labs Lab 12/05/15 1450 12/06/15 0318  AST 31 27  ALT 26 20  ALKPHOS 71 66  BILITOT 0.4 0.5  PROT 8.3* 6.7  ALBUMIN 3.1* 2.6*   No results for input(s): LIPASE, AMYLASE in the last 168 hours. No results for input(s): AMMONIA in the last 168 hours. CBC:  Recent Labs Lab 12/05/15 1450 12/06/15 0005 12/08/15 0507 12/09/15 0438 12/10/15 1238 12/11/15 0945  WBC 33.7*  --  16.2* 10.0 10.1 12.3*  HGB 9.3* 9.3* 7.8* 7.7* 8.3* 8.2*  HCT 29.7* 29.7* 23.9* 23.7* 26.2* 25.2*  MCV 87.1  --  84.8 85.3 86.8 86.0  PLT 325  --  156 168 199 220   Cardiac Enzymes: No results for input(s): CKTOTAL, CKMB, CKMBINDEX, TROPONINI in the last 168 hours. BNP: Invalid input(s): POCBNP CBG:  Recent Labs Lab 12/07/15 0856 12/08/15 0752 12/09/15 0734 12/10/15 0735 12/11/15 0753  GLUCAP 135* 102* 113* 103* 110*   Blood Culture (routine x 2)     Status: None   Collection Time: 12/05/15  2:50 PM  Result Value Ref Range Status   Specimen Description BLOOD RIGHT ANTECUBITAL  Final   Culture  Setup Time   Final   Culture   Final    ESCHERICHIA COLI    Report Status 12/08/2015 FINAL  Final  Blood Culture (routine x 2)     Status: None   Collection Time: 12/05/15  3:30 PM  Result  Value Ref Range Status   Specimen Description BLOOD BLOOD RIGHT FOREARM  Final   Culture  Setup Time   Final   Culture   Final    ESCHERICHIA COLI    Report Status 12/08/2015 FINAL  Final   Organism ID, Bacteria ESCHERICHIA COLI  Final      Susceptibility   Escherichia coli - MIC*    AMPICILLIN <=2 SENSITIVE Sensitive     CEFAZOLIN <=4 SENSITIVE Sensitive     CEFEPIME <=1 SENSITIVE Sensitive     CEFTAZIDIME <=1 SENSITIVE Sensitive     CEFTRIAXONE <=1 SENSITIVE Sensitive     CIPROFLOXACIN <=0.25 SENSITIVE Sensitive     GENTAMICIN <=1 SENSITIVE Sensitive     IMIPENEM <=0.25 SENSITIVE Sensitive  TRIMETH/SULFA <=20 SENSITIVE Sensitive     AMPICILLIN/SULBACTAM <=2 SENSITIVE Sensitive     PIP/TAZO <=4 SENSITIVE Sensitive     * ESCHERICHIA COLI  Urine culture     Status: None   Collection Time: 12/06/15  8:44 PM  Result Value Ref Range Status   Specimen Description URINE, CATHETERIZED  Final   Special Requests NONE  Final   Culture   Final    40,000 COLONIES/ml ESCHERICHIA COLI    Report Status 12/09/2015 FINAL  Final   Organism ID, Bacteria ESCHERICHIA COLI  Final      Susceptibility   Escherichia coli - MIC*    AMPICILLIN <=2 SENSITIVE Sensitive     CEFAZOLIN <=4 SENSITIVE Sensitive     CEFTRIAXONE <=1 SENSITIVE Sensitive     CIPROFLOXACIN <=0.25 SENSITIVE Sensitive     GENTAMICIN <=1 SENSITIVE Sensitive     IMIPENEM <=0.25 SENSITIVE Sensitive     NITROFURANTOIN <=16 SENSITIVE Sensitive     TRIMETH/SULFA <=20 SENSITIVE Sensitive     AMPICILLIN/SULBACTAM <=2 SENSITIVE Sensitive     PIP/TAZO <=4 SENSITIVE Sensitive     * 40,000 COLONIES/ml ESCHERICHIA COLI     Scheduled Meds: . antiseptic oral rinse  7 mL Mouth Rinse BID  . cefTRIAXone (ROCEPHIN)  IV  2 g Intravenous Q24H  . feeding supplement (ENSURE ENLIVE)  237 mL Oral TID BM  . feeding supplement (PRO-STAT SUGAR FREE 64)  30 mL Oral BID  . levETIRAcetam  500 mg Intravenous BID  . potassium chloride  10 mEq  Intravenous Q1 Hr x 3  . sodium chloride flush  3 mL Intravenous Q12H   Continuous Infusions: . dextrose 50 mL (12/10/15 1243)

## 2015-12-12 LAB — BASIC METABOLIC PANEL
ANION GAP: 9 (ref 5–15)
BUN: 25 mg/dL — AB (ref 6–20)
CHLORIDE: 112 mmol/L — AB (ref 101–111)
CO2: 20 mmol/L — AB (ref 22–32)
Calcium: 8.7 mg/dL — ABNORMAL LOW (ref 8.9–10.3)
Creatinine, Ser: 0.95 mg/dL (ref 0.44–1.00)
GFR calc Af Amer: >60 mL/min (ref >60)
GFR calc non Af Amer: 56 mL/min — ABNORMAL LOW (ref >60)
GLUCOSE: 104 mg/dL — AB (ref 65–99)
POTASSIUM: 3.2 mmol/L — AB (ref 3.5–5.1)
Sodium: 141 mmol/L (ref 135–145)

## 2015-12-12 LAB — ABO/RH: ABO/RH(D): O POS

## 2015-12-12 LAB — PREPARE RBC (CROSSMATCH)

## 2015-12-12 LAB — CBC
HEMATOCRIT: 23.5 % — AB (ref 36.0–46.0)
HEMOGLOBIN: 7.6 g/dL — AB (ref 12.0–15.0)
MCH: 28 pg (ref 26.0–34.0)
MCHC: 32.3 g/dL (ref 30.0–36.0)
MCV: 86.7 fL (ref 78.0–100.0)
Platelets: 263 10*3/uL (ref 150–400)
RBC: 2.71 MIL/uL — AB (ref 3.87–5.11)
RDW: 16 % — AB (ref 11.5–15.5)
WBC: 12.2 10*3/uL — ABNORMAL HIGH (ref 4.0–10.5)

## 2015-12-12 LAB — GLUCOSE, CAPILLARY: Glucose-Capillary: 88 mg/dL (ref 65–99)

## 2015-12-12 LAB — MAGNESIUM: Magnesium: 1.3 mg/dL — ABNORMAL LOW (ref 1.7–2.4)

## 2015-12-12 MED ORDER — POTASSIUM CHLORIDE 20 MEQ PO PACK: 20.0000 meq | PACK | Freq: Every day | ORAL | Status: DC

## 2015-12-12 MED ORDER — PRO-STAT SUGAR FREE PO LIQD: 30.0000 mL | Freq: Two times a day (BID) | ORAL | Status: DC

## 2015-12-12 MED ORDER — CETYLPYRIDINIUM CHLORIDE 0.05 % MT LIQD: 7.0000 mL | Freq: Two times a day (BID) | OROMUCOSAL | Status: DC

## 2015-12-12 MED ORDER — POTASSIUM CHLORIDE 10 MEQ/100ML IV SOLN
10.0000 meq | INTRAVENOUS | Status: AC
  Administered 2015-12-12 (×3): 10 meq via INTRAVENOUS
  Filled 2015-12-12 (×3): qty 100

## 2015-12-12 MED ORDER — LEVETIRACETAM 100 MG/ML PO SOLN: 500.0000 mg | Freq: Two times a day (BID) | ORAL | Status: DC

## 2015-12-12 MED ORDER — CIPROFLOXACIN 500 MG/5ML (10%) PO SUSR: 500.0000 mg | Freq: Two times a day (BID) | ORAL | Status: DC

## 2015-12-12 MED ORDER — SODIUM CHLORIDE 0.9 % IV SOLN: Freq: Once | INTRAVENOUS | Status: DC

## 2015-12-12 NOTE — Discharge Instructions (Signed)
Bacteremia °Bacteremia is the presence of bacteria in the blood. A small amount of bacteria may not cause any symptoms. °Sometimes, the bacteria spread and cause infection in other parts of the body, such as the heart, joints, bones, or brain. Having a great amount of bacteria can cause a serious, sometimes life-threatening infection called sepsis. °CAUSES °This condition is caused by bacteria that get into the blood. Bacteria can enter the blood: °· During a dental or medical procedure. °· After you brush your teeth so hard that the gums bleed. °· Through a scrape or cut on your skin. °More severe types of bacteremia can be caused by: °· A bacterial infection, such as pneumonia, that spreads to the blood. °· Using a dirty needle. °RISK FACTORS °This condition is more likely to develop in: °· Children and elderly adults. °· People who have a long-lasting (chronic) disease or medical condition. °· People who have an artificial joint or heart valve. °· People who have heart valve disease. °· People who have a tube, such as a catheter or IV tube, that has been inserted for a medical treatment. °· People who have a weak body defense system (immune system). °· People who use IV drugs. °SYMPTOMS °Usually, this condition does not cause symptoms when it is mild. When it is more serious, it may cause: °· Fever. °· Chills. °· Racing heart. °· Shortness of breath. °· Dizziness. °· Weakness. °· Confusion. °· Nausea or vomiting. °· Diarrhea. °Bacteremia that has spread to other parts of the body may cause symptoms in those areas. °DIAGNOSIS °This condition may be diagnosed with a physical exam and tests, such as: °· A complete blood count (CBC). This test looks for signs of infection. °· Blood cultures. These look for bacteria in your blood. °· Tests of any IV tubes. These look for a source of infection. °· Urine tests. °· Imaging tests, such as an X-ray, CT scan, MRI, or heart ultrasound. °TREATMENT °If the condition is mild,  treatment is usually not needed. Usually, the body's immune system will remove the bacteria. If the condition is more serious, it may be treated with: °· Antibiotic medicines through an IV tube. These may be given for about 2 weeks. At first, the antibiotic that is given may kill most types of blood bacteria. If your test results show that a certain kind of bacteria is causing problems, the antibiotic may be changed to kill only the bacteria that are causing problems. °· Antibiotics taken by mouth. °· Removing any catheter or IV tube that is a source of infection. °· Blood pressure and breathing support, if needed. °· Surgery to control the source or spread of infection, if needed. °HOME CARE INSTRUCTIONS °· Take over-the-counter and prescription medicines only as told by your health care provider. °· If you were prescribed an antibiotic, take it as told by your health care provider. Do not stop taking the antibiotic even if you start to feel better. °· Rest at home until your condition is under control. °· Drink enough fluid to keep your urine clear or pale yellow. °· Keep all follow-up visits as told by your health care provider. This is important. °PREVENTION °Take these actions to help prevent future episodes of bacteremia: °· Get all vaccinations as recommended by your health care provider. °· Clean and cover scrapes or cuts. °· Bathe regularly. °· Wash your hands often. °· Before any dental or surgical procedure, ask your health care provider if you should take an antibiotic. °SEEK MEDICAL   CARE IF: °· Your symptoms get worse. °· You continue to have symptoms after treatment. °· You develop new symptoms after treatment. °SEEK IMMEDIATE MEDICAL CARE IF: °· You have chest pain or trouble breathing. °· You develop confusion, dizziness, or weakness. °· You develop pale skin. °  °This information is not intended to replace advice given to you by your health care provider. Make sure you discuss any questions you have  with your health care provider. °  °Document Released: 07/23/2006 Document Revised: 06/30/2015 Document Reviewed: 12/12/2014 °[employer] Interactive Patient Education ©2016 [employer] ° °

## 2015-12-12 NOTE — Discharge Summary (Addendum)
Physician Discharge Summary  Sabrina Long IEP:[id number] DOB: 1936-09-18 DOA: 12/05/2015  PCP: Hollace Kinnier, DO  Admit date: 12/05/2015 Discharge date: 12/13/2015  Recommendations for Outpatient Follow-up:  Take Cipro solution twice a day for 7 more days on discharge Continue keppra for seizures, script provided for PO liquid in case she cannot tolerate tablets Would minimized ativan and narcotics as it will make patient more drowsy and altered.  Please hold coumadin due to intermittent blood in urine and hemoglobin of 7-8 range. She has gotten 1 unit blood transfusion 2/19 and when her hemoglobin is rechecked and if she is not bleeding anymore then she can resume coumadin.  Discharge Diagnoses:  Principal Problem:   Sepsis due to pneumonia [facility]) Active Problems:   Lobar pneumonia, unspecified organism ([place])   Leukocytosis   Dementia with behavioral disturbance   Acute encephalopathy   Alzheimer's disease   Bilateral pulmonary embolism (HCC)   Essential hypertension   Left leg DVT (HCC)   Protein-calorie malnutrition, severe (HCC)   Hyperkalemia   Seizures (HCC)   Dyslipidemia   Acute kidney injury ([place])   Pressure ulcer    Discharge Condition: stable   Diet recommendation: as tolerated   History of present illness:  80 year old female with past medical history of PE, DVT (on AC with coamdin), dementia, hypertension who presented to [facility] ED with reports per her family of vomiting and poor PO intake for past few days prior to this admission.   In ED, her BP was 89/53 and she is receiving IV fluids, her 2nd L NS and repeat BP 110/60.Blood work showed WBC count 33.7, hgb 9.3, potassium 5.3, creatinine 2.51 (baseline WNL), lactic acid 4.79 and procalcitonin more than 175. CXR showed left lower lobe bronchopneumonia or aspiration pneumonia. Sepsis work up initiated. Started on vanco and zosyn.  Hospital course complicated with Escherichia coli bacteremia.  Hospital  Course:  Assessment/Plan:    Principal Problem:  Sepsis due to pneumonia ([place]) and E.coli bacteremia / Leukocytosis  - Sepsis criteria met on admission with T 101 F, HR 124, RR 27, hypotension, lactic acid 4.79, procalcitonin more than 175.  - Source of infection suspected bronchopneumonia, aspiration pneumonitis and Escherichia coli bacteremia. Blood cultures on admission growing E.Coli  - Stopped vancomycin 2/15 and zosyn 2/17 and started rocephin per blood culture sens report - Change to cipro for 7 days on discharge  - Legionella and influenza are negative - WBC count 12.2, stable; no fever   Active Problems:   Dementia with behavioral disturbance / Acute encephalopathy / Alzheimer's disease - Stable    Bilateral pulmonary embolism (HCC) / LLE DVT - All anticoagulation on hold because of intermittent hematuria   Anemia of chronic disease - Hemoglobin 7.6 today - Will transfuse 1 U 2/19   Essential hypertension - May continue losartan on discharge er home regimen    Protein-calorie malnutrition, severe ([place]) - In the context of chronic illness, worsening dementia - As tolerated    Hyperkalemia / hypokalemia - Likely due to sepsis - Slightly hypokalemia so supplement on discharge for 5 more days    Seizures (HCC) - Keppra 500 mg Q 12 hours per hoem regimen, provided script for susp route of administration  - No reports of seizures    Acute kidney injury ([place]) - Likely secondary to sepsis. Per chart review previous creatinine levels were all within normal limits - Cr now WNL   Pressure ulcers, left hip - Pressure ulcers in different stages 3,  4 and unstageable - WOC assessment done  DVT Prophylaxis  - SCD's bilaterally due to intermittent hematuria    Code Status: DNR/DNI Family Communication: Family not at the bedside, spoke with her son over the phone   IV access:  Peripheral IV  Procedures and diagnostic studies:   Dg Chest 2  View 12/05/2015 1. Left lower lobe bronchopneumonia or aspiration pneumonia suspected. 2. Suboptimal inspiration which accounts for atelectasis at the right lung base. 3. Stable mild cardiomegaly without pulmonary edema.   Medical Consultants:  None   Other Consultants:  PT Nutrition  IAnti-Infectives:   Vancomycin 12/04/2005 --> 12/08/2015 Zosyn 12/08/2015--> 12/10/2015 Rocephin 11/24/2015 --> 12/13/2015    Signed:  Leisa Lenz, MD  [facility] 12/12/2015, 2:35 PM  Pager #: [phone]  Time spent in minutes: more than 30 minutes    Discharge Exam: Filed Vitals:   12/11/15 1853 12/11/15 2053  BP: 159/84 145/77  Pulse: 89 79  Temp:  98.4 F (36.9 C)  Resp: 22 19   Filed Vitals:   12/11/15 0631 12/11/15 1303 12/11/15 1853 12/11/15 2053  BP: 164/80 155/83 159/84 145/77  Pulse: 81 98 89 79  Temp: 97.7 F (36.5 C) 98.5 F (36.9 C)  98.4 F (36.9 C)  TempSrc: Oral Axillary  Axillary  Resp: _0 Height:      Weight: 52.3 kg (115 lb 4.8 oz)     SpO2: 99% 98% 99% 98%    General: Pt is not in acute distress Cardiovascular: Regular rate and rhythm, S1/S2 + Respiratory: no wheezing, no crackles, no rhonchi Abdominal: Soft, non tender, non distended, bowel sounds +, no guarding Extremities: no edema, no cyanosis, pulses palpable bilaterally DP and PT Neuro: Grossly nonfocal  Discharge Instructions  Discharge Instructions    Call MD for:  difficulty breathing, headache or visual disturbances    Complete by:  As directed      Call MD for:  persistant dizziness or light-headedness    Complete by:  As directed      Call MD for:  persistant nausea and vomiting    Complete by:  As directed      Call MD for:  severe uncontrolled pain    Complete by:  As directed      Diet - low sodium heart healthy    Complete by:  As directed      Discharge instructions    Complete by:  As directed   Take Cipro solution twice a day for 7 more days on  discharge Continue keppra for seizures, script provided for PO liquid in case she cannot tolerate tablets Would minimized ativan and narcotics as it will make patient more drowsy and altered.  Please hold coumadin due to intermittent blood in urine and hemoglobin of 7-8 range. She has gotten 1 unit blood transfusion 2/19 and when her hemoglobin is rechecked and if she is not bleeding anymore then she can resume coumadin.     Increase activity slowly    Complete by:  As directed             Medication List    STOP taking these medications        ALPRAZolam 0.5 MG tablet  Commonly known as:  XANAX     cephALEXin 500 MG capsule  Commonly known as:  KEFLEX     ciprofloxacin 250 MG tablet  Commonly known as:  CIPRO     diclofenac sodium 1 % Gel  Commonly known as:  VOLTAREN     hydrALAZINE 50 MG tablet  Commonly known as:  APRESOLINE     levETIRAcetam 500 MG tablet  Commonly known as:  KEPPRA  Replaced by:  levETIRAcetam 100 MG/ML solution     memantine 10 MG tablet  Commonly known as:  NAMENDA     metoprolol tartrate 25 MG tablet  Commonly known as:  LOPRESSOR     nitroGLYCERIN 0.4 MG SL tablet  Commonly known as:  NITROSTAT     oxyCODONE 5 MG immediate release tablet  Commonly known as:  Oxy IR/ROXICODONE     phenazopyridine 200 MG tablet  Commonly known as:  PYRIDIUM     saccharomyces boulardii 250 MG capsule  Commonly known as:  FLORASTOR     warfarin 2 MG tablet  Commonly known as:  COUMADIN     warfarin 5 MG tablet  Commonly known as:  COUMADIN      TAKE these medications        alendronate 70 MG tablet  Commonly known as:  FOSAMAX  TAKE 1 TABLET ONCE A WEEK ON TUESDAYS WITH A FULL GLASS OF WATER ON AN EMPTY STOMACH     antiseptic oral rinse 0.05 % Liqd solution  Commonly known as:  CPC / CETYLPYRIDINIUM CHLORIDE 0.05%  7 mLs by Mouth Rinse route 2 (two) times daily.     ciprofloxacin 500 MG/5ML (10%) suspension  Commonly known as:  CIPRO  Take  5 mLs (500 mg total) by mouth 2 (two) times daily.     lactose free nutrition Liqd  Take 237 mLs by mouth daily as needed (for nutritional supplement).     feeding supplement (ENSURE ENLIVE) Liqd  Take 237 mLs by mouth 3 (three) times daily between meals.     feeding supplement (PRO-STAT SUGAR FREE 64) Liqd  Take 30 mLs by mouth 2 (two) times daily.     levETIRAcetam 100 MG/ML solution  Commonly known as:  KEPPRA  Take 5 mLs (500 mg total) by mouth 2 (two) times daily.     losartan 25 MG tablet  Commonly known as:  COZAAR  TAKE 1 TABLET DAILY FOR BLOOD PRESSURE     omeprazole 20 MG capsule  Commonly known as:  PRILOSEC  TAKE ONE CAPSULE BY MOUTH EVERY DAY FOR REFLUX     potassium chloride 20 MEQ packet  Commonly known as:  KLOR-CON  Take 20 mEq by mouth daily.     pravastatin 80 MG tablet  Commonly known as:  PRAVACHOL  Take one tablet by mouth once daily for cholesterol           Follow-up Information    Follow up with REED, [name], DO. Schedule an appointment as soon as possible for a visit in 1 week.   Specialty:  Geriatric Medicine   Why:  Follow up appt after recent hospitalization   Contact information:   [address] [place] [address] [phone]        The results of significant diagnostics from this hospitalization (including imaging, microbiology, ancillary and laboratory) are listed below for reference.    Significant Diagnostic Studies: Dg Chest 2 View  12/05/2015  CLINICAL DATA:  80 year old presenting with end-stage dementia, now with urinary incontinence despite an indwelling Foley catheter. Acute onset of vomiting approximately 2 days ago. EXAM: CHEST  2 VIEW COMPARISON:  07/22/2015 and earlier. FINDINGS: Suboptimal inspiration. Cardiac silhouette mildly enlarged, unchanged. Atelectasis at the right lung base. More confluent airspace consolidation medially in the  left lower lobe. Lungs otherwise clear. Pulmonary vascularity normal without  evidence pulmonary edema. IMPRESSION: 1. Left lower lobe bronchopneumonia or aspiration pneumonia suspected. 2. Suboptimal inspiration which accounts for atelectasis at the right lung base. 3. Stable mild cardiomegaly without pulmonary edema. Electronically Signed   By: Evangeline Dakin M.D.   On: 12/05/2015 16:59    Microbiology: Recent Results (from the past 240 hour(s))  Blood Culture (routine x 2)     Status: None   Collection Time: 12/05/15  2:50 PM  Result Value Ref Range Status   Specimen Description BLOOD RIGHT ANTECUBITAL  Final   Special Requests BOTTLES DRAWN AEROBIC AND ANAEROBIC 5CC EA  Final   Culture  Setup Time   Final    GRAM NEGATIVE RODS IN BOTH AEROBIC AND ANAEROBIC BOTTLES CRITICAL RESULT CALLED TO, READ BACK BY AND VERIFIED WITH: L. VERGELDEDIOS,RN AT 5449 ON [id number] BY S. YARBROUGH    Culture   Final    ESCHERICHIA COLI SUSCEPTIBILITIES PERFORMED ON PREVIOUS CULTURE WITHIN THE LAST 5 DAYS. Performed at [facility]    Report Status 12/08/2015 FINAL  Final  Blood Culture (routine x 2)     Status: None   Collection Time: 12/05/15  3:30 PM  Result Value Ref Range Status   Specimen Description BLOOD BLOOD RIGHT FOREARM  Final   Special Requests BOTTLES DRAWN AEROBIC AND ANAEROBIC 10CC EA  Final   Culture  Setup Time   Final    GRAM NEGATIVE RODS IN BOTH AEROBIC AND ANAEROBIC BOTTLES CRITICAL RESULT CALLED TO, READ BACK BY AND VERIFIED WITH: Alessandra Bevels [id number] [place]    Culture   Final    ESCHERICHIA COLI Performed at [facility]    Report Status 12/08/2015 FINAL  Final   Organism ID, Bacteria ESCHERICHIA COLI  Final      Susceptibility   Escherichia coli - MIC*    AMPICILLIN <=2 SENSITIVE Sensitive     CEFAZOLIN <=4 SENSITIVE Sensitive     CEFEPIME <=1 SENSITIVE Sensitive     CEFTAZIDIME <=1 SENSITIVE Sensitive     CEFTRIAXONE <=1 SENSITIVE Sensitive     CIPROFLOXACIN <=0.25 SENSITIVE Sensitive     GENTAMICIN <=1 SENSITIVE  Sensitive     IMIPENEM <=0.25 SENSITIVE Sensitive     TRIMETH/SULFA <=20 SENSITIVE Sensitive     AMPICILLIN/SULBACTAM <=2 SENSITIVE Sensitive     PIP/TAZO <=4 SENSITIVE Sensitive     * ESCHERICHIA COLI  Urine culture     Status: None   Collection Time: 12/06/15  8:44 PM  Result Value Ref Range Status   Specimen Description URINE, CATHETERIZED  Final   Special Requests NONE  Final   Culture   Final    40,000 COLONIES/ml ESCHERICHIA COLI Performed at [facility]    Report Status 12/09/2015 FINAL  Final   Organism ID, Bacteria ESCHERICHIA COLI  Final      Susceptibility   Escherichia coli - MIC*    AMPICILLIN <=2 SENSITIVE Sensitive     CEFAZOLIN <=4 SENSITIVE Sensitive     CEFTRIAXONE <=1 SENSITIVE Sensitive     CIPROFLOXACIN <=0.25 SENSITIVE Sensitive     GENTAMICIN <=1 SENSITIVE Sensitive     IMIPENEM <=0.25 SENSITIVE Sensitive     NITROFURANTOIN <=16 SENSITIVE Sensitive     TRIMETH/SULFA <=20 SENSITIVE Sensitive     AMPICILLIN/SULBACTAM <=2 SENSITIVE Sensitive     PIP/TAZO <=4 SENSITIVE Sensitive     * 40,000 COLONIES/ml ESCHERICHIA COLI     Labs: Basic Metabolic  Panel:  Recent Labs Lab 12/08/15 0507 12/09/15 0438 12/10/15 1238 12/11/15 0945 12/12/15 0513  NA 148* 147* 144 142 141  K 2.9* 2.8* 3.0* 3.0* 3.2*  CL 120* 118* 114* 112* 112*  CO2 16* 18* 18* 20* 20*  GLUCOSE 114* 129* 190* 182* 104*  BUN 43* 35* 30* 27* 25*  CREATININE 1.95* 1.61* 1.27* 1.13* 0.95  CALCIUM 7.7* 8.1* 8.6* 8.9 8.7*  MG  --   --   --   --  1.3*   Liver Function Tests:  Recent Labs Lab 12/05/15 1450 12/06/15 0318  AST 31 27  ALT 26 20  ALKPHOS 71 66  BILITOT 0.4 0.5  PROT 8.3* 6.7  ALBUMIN 3.1* 2.6*   No results for input(s): LIPASE, AMYLASE in the last 168 hours. No results for input(s): AMMONIA in the last 168 hours. CBC:  Recent Labs Lab 12/08/15 0507 12/09/15 0438 12/10/15 1238 12/11/15 0945 12/12/15 0513  WBC 16.2* 10.0 10.1 12.3* 12.2*  HGB 7.8* 7.7*  8.3* 8.2* 7.6*  HCT 23.9* 23.7* 26.2* 25.2* 23.5*  MCV 84.8 85.3 86.8 86.0 86.7  PLT 156 168 199 220 263   Cardiac Enzymes: No results for input(s): CKTOTAL, CKMB, CKMBINDEX, TROPONINI in the last 168 hours. BNP: BNP (last 3 results) No results for input(s): BNP in the last 8760 hours.  ProBNP (last 3 results) No results for input(s): PROBNP in the last 8760 hours.  CBG:  Recent Labs Lab 12/08/15 0752 12/09/15 0734 12/10/15 0735 12/11/15 0753 12/12/15 0854  GLUCAP 102* 113* 103* 110* 88

## 2015-12-13 LAB — TYPE AND SCREEN
ABO/RH(D): O POS
Antibody Screen: POSITIVE
DAT, IgG: NEGATIVE
DONOR AG TYPE: NEGATIVE
PT AG Type: NEGATIVE
Unit division: 0

## 2015-12-13 LAB — BASIC METABOLIC PANEL
ANION GAP: 7 (ref 5–15)
BUN: 23 mg/dL — ABNORMAL HIGH (ref 6–20)
CALCIUM: 9.1 mg/dL (ref 8.9–10.3)
CO2: 22 mmol/L (ref 22–32)
CREATININE: 0.87 mg/dL (ref 0.44–1.00)
Chloride: 114 mmol/L — ABNORMAL HIGH (ref 101–111)
Glucose, Bld: 107 mg/dL — ABNORMAL HIGH (ref 65–99)
Potassium: 3.9 mmol/L (ref 3.5–5.1)
SODIUM: 143 mmol/L (ref 135–145)

## 2015-12-13 LAB — CBC
HCT: 27.5 % — ABNORMAL LOW (ref 36.0–46.0)
Hemoglobin: 8.7 g/dL — ABNORMAL LOW (ref 12.0–15.0)
MCH: 27.2 pg (ref 26.0–34.0)
MCHC: 31.6 g/dL (ref 30.0–36.0)
MCV: 85.9 fL (ref 78.0–100.0)
PLATELETS: 340 10*3/uL (ref 150–400)
RBC: 3.2 MIL/uL — AB (ref 3.87–5.11)
RDW: 17.2 % — AB (ref 11.5–15.5)
WBC: 11.7 10*3/uL — AB (ref 4.0–10.5)

## 2015-12-13 LAB — GLUCOSE, CAPILLARY: GLUCOSE-CAPILLARY: 97 mg/dL (ref 65–99)

## 2015-12-13 NOTE — Progress Notes (Signed)
12/13/15  1100  Reviewed discharge instructions with patient's son. Son verbalized understanding of discharge instructions. Copy of discharge instructions and prescriptions given to son. 1230 Called son when EMS arrived. Son states he's home waiting for patient.

## 2015-12-13 NOTE — Care Management Important Message (Signed)
Important Message  Patient Details  Name: Sabrina Long MRN: [id number] Date of Birth: 01-09-1936   Medicare Important Message Given:  Yes    Haskell Flirt 12/13/2015, 3:47 PMImportant Message  Patient Details  Name: Sabrina Long MRN: [id number] Date of Birth: November 24, 1935   Medicare Important Message Given:  Yes    Haskell Flirt 12/13/2015, 3:47 PM

## 2015-12-13 NOTE — Progress Notes (Addendum)
Pt seen and examined at the bedside Stable for discharge today Please refer to discharge summary completed 12/12/2015 Family agrees with discharge plan  Needs transport to home on discharge  Manson Passey [facility] [phone]

## 2015-12-14 ENCOUNTER — Telehealth: Payer: Self-pay | Admitting: *Deleted

## 2015-12-14 NOTE — Telephone Encounter (Signed)
[name] with [name] called and requested verbal orders to resume patient's care for wound vac and INR.  Verbal orders given.

## 2015-12-15 ENCOUNTER — Telehealth: Payer: Self-pay | Admitting: *Deleted

## 2015-12-15 NOTE — Telephone Encounter (Signed)
[name] with [name] called and stated that patient has been getting double dose of Keppra. Son has been giving liquid Keppra twice daily and Keppra Tablets twice daily. Son forgot to take the pills out of mediation pill box. Nurse stated that there are no changes in patient's status and vitals are normal. Patient does have an appointment with you on the 27th. Please Advise.

## 2015-12-15 NOTE — Telephone Encounter (Signed)
I will check a keppra level when she comes for her appt as long as it's on a lab day (if not, change to lab day).

## 2015-12-16 DIAGNOSIS — L89154 Pressure ulcer of sacral region, stage 4: Secondary | ICD-10-CM

## 2015-12-16 DIAGNOSIS — S30810D Abrasion of lower back and pelvis, subsequent encounter: Secondary | ICD-10-CM

## 2015-12-16 DIAGNOSIS — L89224 Pressure ulcer of left hip, stage 4: Secondary | ICD-10-CM

## 2015-12-16 DIAGNOSIS — J189 Pneumonia, unspecified organism: Secondary | ICD-10-CM

## 2015-12-16 NOTE — Telephone Encounter (Signed)
[name] with [facility] notified and agreed. Will have patient continue the Keppra as Directed twice daily and will keep appointment on Monday to see Dr. Renato Gails.

## 2015-12-20 ENCOUNTER — Encounter: Payer: Self-pay | Admitting: Internal Medicine

## 2015-12-20 ENCOUNTER — Ambulatory Visit (INDEPENDENT_AMBULATORY_CARE_PROVIDER_SITE_OTHER): Payer: Medicare Other | Admitting: Internal Medicine

## 2015-12-20 ENCOUNTER — Other Ambulatory Visit: Payer: Self-pay | Admitting: Internal Medicine

## 2015-12-20 VITALS — BP 130/84 | HR 79 | Temp 95.7°F

## 2015-12-20 DIAGNOSIS — E43 Unspecified severe protein-calorie malnutrition: Secondary | ICD-10-CM

## 2015-12-20 DIAGNOSIS — J189 Pneumonia, unspecified organism: Secondary | ICD-10-CM

## 2015-12-20 DIAGNOSIS — F0280 Dementia in other diseases classified elsewhere without behavioral disturbance: Secondary | ICD-10-CM | POA: Diagnosis not present

## 2015-12-20 DIAGNOSIS — A419 Sepsis, unspecified organism: Secondary | ICD-10-CM | POA: Diagnosis not present

## 2015-12-20 DIAGNOSIS — I2699 Other pulmonary embolism without acute cor pulmonale: Secondary | ICD-10-CM

## 2015-12-20 DIAGNOSIS — E876 Hypokalemia: Secondary | ICD-10-CM | POA: Diagnosis not present

## 2015-12-20 DIAGNOSIS — R569 Unspecified convulsions: Secondary | ICD-10-CM

## 2015-12-20 DIAGNOSIS — G309 Alzheimer's disease, unspecified: Secondary | ICD-10-CM

## 2015-12-20 DIAGNOSIS — E785 Hyperlipidemia, unspecified: Secondary | ICD-10-CM

## 2015-12-20 DIAGNOSIS — I10 Essential (primary) hypertension: Secondary | ICD-10-CM | POA: Diagnosis not present

## 2015-12-20 DIAGNOSIS — D649 Anemia, unspecified: Secondary | ICD-10-CM | POA: Diagnosis not present

## 2015-12-20 DIAGNOSIS — L8990 Pressure ulcer of unspecified site, unspecified stage: Secondary | ICD-10-CM | POA: Diagnosis not present

## 2015-12-20 MED ORDER — POTASSIUM CHLORIDE 20 MEQ PO PACK: 20.0000 meq | PACK | Freq: Every day | ORAL | Status: DC

## 2015-12-20 MED ORDER — OXYCODONE HCL 5 MG PO TABS: ORAL_TABLET | ORAL | Status: DC

## 2015-12-20 NOTE — Progress Notes (Signed)
Patient ID: Sabrina Long, female   DOB: 04/26/1936, 80 y.o.   MRN: [id number]   Location:  [facility] clinic Provider: Verdis Koval L. Renato Gails, D.O., C.M.D.  Code Status: DNR/DNI Goals of Care:  Advanced Directives 12/20/2015  Does patient have an advance directive? Yes  Type of Advance Directive Healthcare Power of Attorney  Does patient want to make changes to advanced directive? -  Copy of advanced directive(s) in chart? Yes  Pre-existing out of facility DNR order (yellow form or pink MOST form) -  pt with terminal dementia and less than 6 month prognosis  Chief Complaint  Patient presents with  . Hospitalization Follow-up    HPI: Patient is a 80 y.o. female with terminal stage dementia seen today with her son for hospital follow-up s/p admission with sepsis 8 days ago felt to be due to pneumonia and UTI.  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  Then less than a day back here, she was weak and lethargic, vomited and could not take po.  He called 911.  She was treated for sepsis secondary to pneumonia with vanc and zosyn which was then changed over to cipro.  She was said to also have a UTI again.  She has completed the course of cipro liquid.  Catheter is no longer leaking--gets changed monthly by home health.  She had hematuria and coumadin has been "held" for this.  He has not seen gross hematuria since she's been home, had 1 unit- transfused--needs f/u lab.  Still has pressure ulcers on sacrum and hips.  Has wound vac and RN coming out to do dressing changes.  She is on potassium but not magnesium. She apparently completed the course of potassium that was given, but it appears that was meant to be ongoing.    We also discussed that she aspirates and that is likely why she had pneumonia one day and it was resolved the next.   He says she has no difficulty swallowing.  We again discussed the benefits of  hospice care for her.  Her son does not think he can "not call 911" for her to be sent to the hospital if she is acutely ill.  He also does not think his whole family will agree with hospice care.  He agrees to stopping several medications today that will no longer benefit her which is a big step for him.  He also admits to being selfish about not wanting to lose her.  He also seems to think that not treating some potentially reversible conditions is going against God.  We discussed that actually, doing all of these things could be looked at the same way.  He did not seem to agree with that perspective but it was offered.  He says maybe the family can meet with hospice again.  He does not seem willing to accept it though so will hold off on that referral today.  History reviewed. No pertinent past medical history. PMH has been deleted from pt's chart while she was hospitalized.  Reviewed old note and it was there!  Will need to be abstracted back in here which will take several mins of staff time.  Past Surgical History  Procedure Laterality Date  . Cesarean section      Allergies  Allergen Reactions  . Risperdal [Risperidone] Other (See Comments)    sedation      Medication List  This list is accurate as of: 12/20/15  9:22 AM.  Always use your most recent med list.               alendronate 70 MG tablet  Commonly known as:  FOSAMAX  TAKE 1 TABLET ONCE A WEEK ON TUESDAYS WITH A FULL GLASS OF WATER ON AN EMPTY STOMACH     ALPRAZolam 0.5 MG tablet  Commonly known as:  XANAX  Take 0.5 mg by mouth. Take 1/2 tablet in morning take one tablet in evening     antiseptic oral rinse 0.05 % Liqd solution  Commonly known as:  CPC / CETYLPYRIDINIUM CHLORIDE 0.05%  7 mLs by Mouth Rinse route 2 (two) times daily.     lactose free nutrition Liqd  Take 237 mLs by mouth. Take every other day     feeding supplement (ENSURE ENLIVE) Liqd  Take 237 mLs by mouth 3 (three) times daily between  meals.     hydrALAZINE 50 MG tablet  Commonly known as:  APRESOLINE  Take one three times daily     levETIRAcetam 100 MG/ML solution  Commonly known as:  KEPPRA  Take 5 mLs (500 mg total) by mouth 2 (two) times daily.     losartan 25 MG tablet  Commonly known as:  COZAAR  TAKE 1 TABLET DAILY FOR BLOOD PRESSURE     memantine 10 MG tablet  Commonly known as:  NAMENDA  Take 10 mg by mouth 2 (two) times daily. Take one twice daily     metoprolol tartrate 25 MG tablet  Commonly known as:  LOPRESSOR  Take 25 mg by mouth. Take one twice daily     omeprazole 20 MG capsule  Commonly known as:  PRILOSEC  TAKE ONE CAPSULE BY MOUTH EVERY DAY FOR REFLUX     oxyCODONE 5 MG immediate release tablet  Commonly known as:  Oxy IR/ROXICODONE  Take 1/2 tablet three times daily     pravastatin 80 MG tablet  Commonly known as:  PRAVACHOL  Take one tablet by mouth once daily for cholesterol        Review of Systems:  Review of Systems  Constitutional: Positive for weight loss and malaise/fatigue.  HENT: Negative for congestion.   Respiratory: Negative for cough and shortness of breath.   Cardiovascular: Negative for chest pain and leg swelling.  Gastrointestinal: Negative for abdominal pain.  Genitourinary:       Has foley catheter with dark yellow urine  Musculoskeletal: Negative for falls.  Skin:       Pressure ulcers of sacrum and hip  Neurological: Positive for weakness. Negative for dizziness and loss of consciousness.       Lethargic today (but has not been alert for a visit here in years), nonverbal  Psychiatric/Behavioral: Positive for memory loss.    Health Maintenance  Topic Date Due  . FOOT EXAM  06/10/1948  . OPHTHALMOLOGY EXAM  06/10/1948  . TETANUS/TDAP  06/10/1957  . ZOSTAVAX  06/10/1998  . PNA vac Low Risk Adult (2 of 2 - PCV13) 08/01/2015  . HEMOGLOBIN A1C  08/17/2015  . INFLUENZA VACCINE  05/23/2016  . DEXA SCAN  Completed    Physical Exam: Filed Vitals:     12/20/15 0856  BP: 130/84  Pulse: 79  Temp: 95.7 F (35.4 C)  TempSrc: Axillary  SpO2: 97%   There is no weight on file to calculate BMI. Physical Exam  Constitutional:  Cachectic female in wheelchair, difficulty supporting her head  HENT:  Head: Normocephalic and atraumatic.  Cardiovascular: Normal rate, regular rhythm and normal heart sounds.   Pulmonary/Chest: Effort normal and breath sounds normal. No respiratory distress.  Abdominal: Bowel sounds are normal.  Genitourinary:  Foley in place with dark yellow urine  Musculoskeletal:  Flexion contractures of extremities  Neurological:  Lethargic, nonverbal  Skin:  Visible ulceration of right ear; also has pressure ulcers of hips and sacrum with wound vac    Labs reviewed: Basic Metabolic Panel:  Recent Labs  40/98/11 1907  12/11/15 0945 12/12/15 0513 12/13/15 0423  NA  --   < > 142 141 143  K  --   < > 3.0* 3.2* 3.9  CL  --   < > 112* 112* 114*  CO2  --   < > 20* 20* 22  GLUCOSE  --   < > 182* 104* 107*  BUN  --   < > 27* 25* 23*  CREATININE  --   < > 1.13* 0.95 0.87  CALCIUM  --   < > 8.9 8.7* 9.1  MG  --   --   --  1.3*  --   TSH 1.717  --   --   --   --   < > = values in this interval not displayed. Liver Function Tests:  Recent Labs  07/22/15 1925 12/05/15 1450 12/06/15 0318  AST 43* 31 27  ALT ALKPHOS 69 71 66  BILITOT 1.7* 0.4 0.5  PROT 8.4* 8.3* 6.7  ALBUMIN 3.3* 3.1* 2.6*    Recent Labs  02/16/15 0315  LIPASE 22   No results for input(s): AMMONIA in the last 8760 hours. CBC:  Recent Labs  02/15/15 1140  06/22/15 1320  07/22/15 1925  12/11/15 0945 12/12/15 0513 12/13/15 0423  WBC 7.1  < > 6.7  < > 10.6*  < > 12.3* 12.2* 11.7*  NEUTROABS 5.1  --  4.3  --  6.2  --   --   --   --   HGB 10.9*  < > 10.9*  < > 11.7*  < > 8.2* 7.6* 8.7*  HCT 34.1*  < > 36.2  < > 35.7*  < > 25.2* 23.5* 27.5*  MCV 94.2  < > 95.3  < > 89.7  < > 86.0 86.7 85.9  PLT 326  < > 341  < > 395  < >  220 263 340  < > = values in this interval not displayed. Lipid Panel: No results for input(s): CHOL, HDL, LDLCALC, TRIG, CHOLHDL, LDLDIRECT in the last 8760 hours. Lab Results  Component Value Date   HGBA1C 5.5 02/15/2015    Procedures since last visit: Dg Chest 2 View  12/05/2015  CLINICAL DATA:  80 year old presenting with end-stage dementia, now with urinary incontinence despite an indwelling Foley catheter. Acute onset of vomiting approximately 2 days ago. EXAM: CHEST  2 VIEW COMPARISON:  07/22/2015 and earlier. FINDINGS: Suboptimal inspiration. Cardiac silhouette mildly enlarged, unchanged. Atelectasis at the right lung base. More confluent airspace consolidation medially in the left lower lobe. Lungs otherwise clear. Pulmonary vascularity normal without evidence pulmonary edema. IMPRESSION: 1. Left lower lobe bronchopneumonia or aspiration pneumonia suspected. 2. Suboptimal inspiration which accounts for atelectasis at the right lung base. 3. Stable mild cardiomegaly without pulmonary edema. Electronically Signed   By: Hulan Saas M.D.   On: 12/05/2015 16:59    Assessment/Plan 1. Alzheimer's dementia -end stage -again emphasized that she would benefit most from  hospice care to stop sending her to the hospital where she is stuck multiple times, disoriented and her life is prolonged -seems like her son is more willing to consider this option in the near future -did agree to stopping several meds  2. Bilateral pulmonary embolism (HCC) -coumadin on hold due to hematuria during hospitalization in context of UTI and some complication of her foley (leakage and wrong size/type, it sounds like) -remains off coumadin--would favor not resuming this due to her frail state and high risk of recurrent bleeding  3. Essential hypertension -bp controlled with lopressor and losartan  4. Sepsis due to pneumonia (HCC) -resolved -likely aspiration type considering her end stage dementia and rapid  improvement on CXR  5. Pressure ulcer -multiple stage 3,4 and unstageables on hips and sacrum -being managed by [facility] care wound RN with wound vac -due to terminal dementia and malnutrition -cont pain meds as needed for this  6. Protein-calorie malnutrition, severe (HCC) -cont dietary supplements  7. Seizures (HCC) -cont keppra liquid as she's had at least one seizure, but others during past hospitalizations  8. Dyslipidemia -will stop pravachol due to end stage dementia  Labs/tests ordered:   Orders Placed This Encounter  Procedures  . CBC with Differential/Platelet  . Comprehensive metabolic panel  . Protime-INR  . Magnesium   Next appt:  3 mos med mgt  Semaja Lymon L. Ismar Yabut, D.O. Geriatrics [facility] [address], [place] [address] Cell Phone (Mon-Fri 8am-5pm):  [phone] On Call:  [phone] & follow prompts after 5pm & weekends Office Phone:  [phone] Office Fax:  [phone]

## 2015-12-20 NOTE — Patient Instructions (Addendum)
STOP THE FOLLOWING MEDICATIONS: XANAX--TAPER OFF--DECREASE TO NONE IN THE DAYTIME AND 1/2 TAB AT BEDTIME FOR ONE WEEK,  THEN DISCONTINUE XANAX  HYDRALAZINE NAMENDA LOPRESSOR VOLTAREN NTG PYRIDIUM FLORASTOR COUMADIN FOSAMAX OMEPRAZOLE PRAVACHOL   I still recommend Hospice CARE. Hospice is a service that is designed to provide people who are terminally ill and their families with medical, spiritual, and psychological support. Its aim is to improve your quality of life by keeping you as alert and comfortable as possible. Hospice is performed by a team of health care professionals and volunteers who:  Help keep you comfortable. Hospice can be provided in your home or in a homelike setting. The hospice staff works with your family and friends to help meet your needs. You will enjoy the support of loved ones by receiving much of your basic care from family and friends.  Provide pain relief and manage your symptoms. The staff supply all necessary medicines and equipment.  Provide companionship when you are alone.  Allow you and your family to rest. They may do light housekeeping, prepare meals, and run errands.  Provide counseling. They will make sure your emotional, spiritual, and social needs and those of your family are being met.  Provide spiritual care. Spiritual care is individualized to meet your needs and your family's needs. It may involve helping you look at what death means to you, say goodbye, or perform a specific religious ceremony or ritual. Hospice teams often include:  A nurse.  A doctor.  Social workers.  Religious leaders (such as a Clinical biochemist).  Trained volunteers. WHEN SHOULD HOSPICE CARE BEGIN? Most people who use hospice are believed to have fewer than 6 months to live. Your family and health care providers can help you decide when hospice services should begin. If your condition improves, you may discontinue the program. WHAT [place]? Most hospice programs are run by nonprofit, independent organizations. Some are affiliated with hospitals, nursing homes, or home health care agencies. Hospice programs can take place in the home or at a hospice center, hospital, or skilled nursing facility. When choosing a hospice program, ask the following questions:  What services are available to me?  What services are offered to my loved ones?  How involved are my loved ones?  How involved is my health care provider?  Who makes up the hospice care team? How are they trained or screened?  How will my pain and symptoms be managed?  If my circumstances change, can the services be provided in a different setting, such as my home or in the hospital?  Is the program reviewed and licensed by the state or certified in some other way? WHERE CAN I LEARN MORE ABOUT HOSPICE? You can learn about existing hospice programs in your area from your health care providers. You can also read more about hospice online. The websites of the following organizations contain helpful information:  The [facility] and Palliative Care Organization [facility]).  The Hospice Association of America ([place]).  The [place].  The American Cancer Society (ACS).  Hospice Net.   This information is not intended to replace advice given to you by your health care provider. Make sure you discuss any questions you have with your health care provider.   Document Released: 01/26/2004 Document Revised: 10/14/2013 Document Reviewed: 08/19/2013 Elsevier Interactive Patient Education [employer].

## 2015-12-21 LAB — PROTIME-INR
INR: 1.1 (ref 0.8–1.2)
Prothrombin Time: 11.6 s (ref 9.1–12.0)

## 2015-12-21 LAB — COMPREHENSIVE METABOLIC PANEL
ALT: 16 IU/L (ref 0–32)
AST: 17 IU/L (ref 0–40)
Albumin/Globulin Ratio: 0.7 — ABNORMAL LOW (ref 1.1–2.5)
Albumin: 3.7 g/dL (ref 3.5–4.8)
Alkaline Phosphatase: 84 IU/L (ref 39–117)
BUN/Creatinine Ratio: 30 — ABNORMAL HIGH (ref 11–26)
BUN: 22 mg/dL (ref 8–27)
Bilirubin Total: 0.2 mg/dL (ref 0.0–1.2)
CO2: 20 mmol/L (ref 18–29)
Calcium: 10.2 mg/dL (ref 8.7–10.3)
Chloride: 113 mmol/L — ABNORMAL HIGH (ref 96–106)
Creatinine, Ser: 0.74 mg/dL (ref 0.57–1.00)
GFR calc Af Amer: 90 mL/min/{1.73_m2} (ref >59)
GFR calc non Af Amer: 78 mL/min/{1.73_m2} (ref >59)
Globulin, Total: 5.5 g/dL — ABNORMAL HIGH (ref 1.5–4.5)
Glucose: 144 mg/dL — ABNORMAL HIGH (ref 65–99)
Potassium: 4.4 mmol/L (ref 3.5–5.2)
Sodium: 154 mmol/L (ref 134–144)
Total Protein: 9.2 g/dL — ABNORMAL HIGH (ref 6.0–8.5)

## 2015-12-21 LAB — CBC WITH DIFFERENTIAL/PLATELET
Basophils Absolute: 0.1 10*3/uL (ref 0.0–0.2)
Basos: 2 %
EOS (ABSOLUTE): 0.1 10*3/uL (ref 0.0–0.4)
Eos: 1 %
Hematocrit: 34.1 % (ref 34.0–46.6)
Hemoglobin: 10.4 g/dL — ABNORMAL LOW (ref 11.1–15.9)
Immature Grans (Abs): 0 10*3/uL (ref 0.0–0.1)
Immature Granulocytes: 0 %
Lymphocytes Absolute: 2.6 10*3/uL (ref 0.7–3.1)
Lymphs: 29 %
MCH: 26.5 pg — ABNORMAL LOW (ref 26.6–33.0)
MCHC: 30.5 g/dL — ABNORMAL LOW (ref 31.5–35.7)
MCV: 87 fL (ref 79–97)
Monocytes Absolute: 0.3 10*3/uL (ref 0.1–0.9)
Monocytes: 4 %
Neutrophils Absolute: 6 10*3/uL (ref 1.4–7.0)
Neutrophils: 64 %
Platelets: 633 10*3/uL — ABNORMAL HIGH (ref 150–379)
RBC: 3.93 x10E6/uL (ref 3.77–5.28)
RDW: 16.4 % — ABNORMAL HIGH (ref 12.3–15.4)
WBC: 9.2 10*3/uL (ref 3.4–10.8)

## 2015-12-21 LAB — MAGNESIUM: Magnesium: 2.1 mg/dL (ref 1.6–2.3)

## 2015-12-22 ENCOUNTER — Telehealth: Payer: Self-pay | Admitting: *Deleted

## 2015-12-22 MED ORDER — AMBULATORY NON FORMULARY MEDICATION: Status: DC

## 2015-12-22 NOTE — Telephone Encounter (Signed)
Sabrina Long, son called and stated that patient needs a Suction Device Pump to get the antiseptic oral rinse out of patient's mouth. Stated that the Antiseptic oral rinse came in tubes with a toothbrush device on the end that needs a suction pump device attached. Son stated he spoke with the [facility] nurse and she stated that this would have to go through the Primary Provider.  Please Advise.

## 2015-12-22 NOTE — Telephone Encounter (Signed)
Ok to do a prescription for this and I will sign it tomorrow in the office.

## 2015-12-22 NOTE — Telephone Encounter (Signed)
Printed and placed for Dr. Renato Gails to review and sign.

## 2015-12-23 NOTE — Telephone Encounter (Signed)
Son will call back to let us know where to fax Rx to.

## 2015-12-24 ENCOUNTER — Encounter (HOSPITAL_BASED_OUTPATIENT_CLINIC_OR_DEPARTMENT_OTHER): Payer: Medicare Other | Attending: Internal Medicine

## 2015-12-24 DIAGNOSIS — F0280 Dementia in other diseases classified elsewhere without behavioral disturbance: Secondary | ICD-10-CM | POA: Insufficient documentation

## 2015-12-24 DIAGNOSIS — L89224 Pressure ulcer of left hip, stage 4: Secondary | ICD-10-CM | POA: Insufficient documentation

## 2015-12-24 DIAGNOSIS — I10 Essential (primary) hypertension: Secondary | ICD-10-CM | POA: Insufficient documentation

## 2015-12-24 DIAGNOSIS — Z86718 Personal history of other venous thrombosis and embolism: Secondary | ICD-10-CM | POA: Insufficient documentation

## 2015-12-24 DIAGNOSIS — M1990 Unspecified osteoarthritis, unspecified site: Secondary | ICD-10-CM | POA: Insufficient documentation

## 2015-12-24 DIAGNOSIS — G8250 Quadriplegia, unspecified: Secondary | ICD-10-CM | POA: Insufficient documentation

## 2015-12-24 DIAGNOSIS — G309 Alzheimer's disease, unspecified: Secondary | ICD-10-CM | POA: Insufficient documentation

## 2015-12-24 DIAGNOSIS — L89154 Pressure ulcer of sacral region, stage 4: Secondary | ICD-10-CM | POA: Insufficient documentation

## 2015-12-24 DIAGNOSIS — I252 Old myocardial infarction: Secondary | ICD-10-CM | POA: Insufficient documentation

## 2015-12-31 ENCOUNTER — Encounter (HOSPITAL_BASED_OUTPATIENT_CLINIC_OR_DEPARTMENT_OTHER): Payer: Medicare Other | Attending: Internal Medicine

## 2015-12-31 ENCOUNTER — Telehealth: Payer: Self-pay

## 2015-12-31 DIAGNOSIS — I10 Essential (primary) hypertension: Secondary | ICD-10-CM | POA: Diagnosis not present

## 2015-12-31 DIAGNOSIS — L89224 Pressure ulcer of left hip, stage 4: Secondary | ICD-10-CM | POA: Diagnosis not present

## 2015-12-31 DIAGNOSIS — G8250 Quadriplegia, unspecified: Secondary | ICD-10-CM | POA: Diagnosis not present

## 2015-12-31 DIAGNOSIS — M1990 Unspecified osteoarthritis, unspecified site: Secondary | ICD-10-CM | POA: Diagnosis not present

## 2015-12-31 DIAGNOSIS — Z86718 Personal history of other venous thrombosis and embolism: Secondary | ICD-10-CM | POA: Diagnosis not present

## 2015-12-31 DIAGNOSIS — F0280 Dementia in other diseases classified elsewhere without behavioral disturbance: Secondary | ICD-10-CM | POA: Diagnosis not present

## 2015-12-31 DIAGNOSIS — I252 Old myocardial infarction: Secondary | ICD-10-CM | POA: Diagnosis not present

## 2015-12-31 DIAGNOSIS — G309 Alzheimer's disease, unspecified: Secondary | ICD-10-CM | POA: Diagnosis not present

## 2015-12-31 DIAGNOSIS — L89154 Pressure ulcer of sacral region, stage 4: Secondary | ICD-10-CM | POA: Diagnosis present

## 2015-12-31 NOTE — Telephone Encounter (Signed)
Received message on triage voice mail wanted to know if we received urine results, will fax again. Showed results to Dr. Renato Gailseed, this is preliminary report will await culture report. She did not order this. Called [name] at [facility] [phone] back to let her know we received the preliminary urine results. Asked her about where the order to do this urine came from, it is a standard order that Dr. Renato Gailseed had signed, other wise she would have to call each time. I asked why they did it, the urine had strong smell and it was cloudy. Dr. Renato Gailseed said it is probably because she doesn't drink enough. She will call the next time she needs order for urine test.

## 2016-01-04 NOTE — Telephone Encounter (Signed)
Dr.Green please advise  

## 2016-01-04 NOTE — Telephone Encounter (Signed)
Please have covering physician/APP address.

## 2016-01-04 NOTE — Telephone Encounter (Signed)
Final urine culture received in office yesterday at 15:27 pm. Results placed in Dr.Reed's urgent time sensitive folder  Please advise

## 2016-01-05 MED ORDER — SULFAMETHOXAZOLE-TRIMETHOPRIM 800-160 MG PO TABS: 1.0000 | ORAL_TABLET | Freq: Two times a day (BID) | ORAL | Status: DC

## 2016-01-05 NOTE — Telephone Encounter (Signed)
Unfortunately, the report is not in Epic. Where would I access this to be able to respond and answer this question.

## 2016-01-05 NOTE — Telephone Encounter (Signed)
Per Dr.Green patient to take Septra DS #14 1 by mouth twice daily.  [name] aware rx sent to pharmacy

## 2016-01-09 ENCOUNTER — Other Ambulatory Visit: Payer: Self-pay | Admitting: Internal Medicine

## 2016-01-10 ENCOUNTER — Telehealth: Payer: Self-pay | Admitting: *Deleted

## 2016-01-10 DIAGNOSIS — R0989 Other specified symptoms and signs involving the circulatory and respiratory systems: Secondary | ICD-10-CM

## 2016-01-10 NOTE — Telephone Encounter (Addendum)
[name] with [name] called with Concerns: 1. Hematuria in urine. Foley changed on Friday, thick creamy urine output with blood in urine on Friday and today. Is on a round of antibiotic of Septra DS since 3/15 for 7 days.   2. Still leaking some urine. Nurse wants to know can they have an order for 16 to 18 French 30 cc Cath.   3. Noticed some crackle in Right Lower Lobe this morning.  Vitals: T:98.8, 97%, Pulse 74, BP 115/64 Please Advise.

## 2016-01-11 NOTE — Telephone Encounter (Signed)
1.  Complete course of abx.  If not better at that time, will need to reassess.  Ideally, pt should be seen by NP or MD for evaluation as she almost always has awful looking urine.  Awful looking urine is not a reason for antibiotics.  It's a reason for hydration.   2.  They may increase the catheter french to 18.   3.  Obtain a chest xray, PA and lateral to evaluate for aspiration pneumonia.

## 2016-01-11 NOTE — Telephone Encounter (Signed)
[name] Notified. Placed order for chest x-Ray

## 2016-01-13 ENCOUNTER — Other Ambulatory Visit: Payer: Self-pay | Admitting: Internal Medicine

## 2016-01-18 ENCOUNTER — Telehealth: Payer: Self-pay | Admitting: *Deleted

## 2016-01-18 DIAGNOSIS — R319 Hematuria, unspecified: Secondary | ICD-10-CM

## 2016-01-18 NOTE — Telephone Encounter (Addendum)
Per Dr. Olin Piaeed---Refer to Urologist.  Order placed. [name] Notified and agreed.

## 2016-01-18 NOTE — Telephone Encounter (Signed)
Patient does not have a Insurance underwriterUrologist. Is it ok to refer? Please Advise.

## 2016-01-18 NOTE — Telephone Encounter (Signed)
[name] with [name] called and stated that patient continues to have Hematuria, no other symptoms. Has completed antibiotic on Sunday. Please Advise.

## 2016-01-18 NOTE — Telephone Encounter (Signed)
Needs to see her urologist.

## 2016-01-18 NOTE — Addendum Note (Signed)
Addended by: Nelda SevereMAY, Joy Haegele A on: 01/18/2016 03:01 PM   Modules accepted: Orders

## 2016-01-18 NOTE — Telephone Encounter (Signed)
[name] with [name] will Buyer, retailcontact Urologist.

## 2016-01-19 ENCOUNTER — Other Ambulatory Visit: Payer: Self-pay

## 2016-01-19 DIAGNOSIS — L8990 Pressure ulcer of unspecified site, unspecified stage: Secondary | ICD-10-CM

## 2016-01-19 MED ORDER — OXYCODONE HCL 5 MG PO TABS: ORAL_TABLET | ORAL | Status: DC

## 2016-01-21 ENCOUNTER — Other Ambulatory Visit: Payer: Self-pay | Admitting: *Deleted

## 2016-01-21 DIAGNOSIS — L8990 Pressure ulcer of unspecified site, unspecified stage: Secondary | ICD-10-CM

## 2016-01-21 MED ORDER — OXYCODONE HCL 5 MG PO TABS: ORAL_TABLET | ORAL | Status: DC

## 2016-01-21 NOTE — Telephone Encounter (Signed)
Patient son requested and will pick up 

## 2016-01-27 ENCOUNTER — Encounter: Payer: Self-pay | Admitting: Internal Medicine

## 2016-01-31 ENCOUNTER — Telehealth: Payer: Self-pay | Admitting: *Deleted

## 2016-01-31 ENCOUNTER — Other Ambulatory Visit: Payer: Self-pay | Admitting: Internal Medicine

## 2016-01-31 NOTE — Telephone Encounter (Signed)
[name] with [name] called requesting verbal order for Mobile X-Ray due to Diminished lung sounds in Right lower lobe and crackles through the left lower lobe. No Cough and No fever but patient has had a history of fluid on the lungs. Satish Hammers we give verbal order for mobile chest x-ray. Please Advise.

## 2016-01-31 NOTE — Telephone Encounter (Signed)
Ok to do portable CXR--2 view if possible and send me results.  Home health nurse please call our office if CXR positive for pneumonia as I will not be physically present for 2 days.

## 2016-02-01 NOTE — Telephone Encounter (Signed)
LMOM for Home Health Nurse with Verbal orders

## 2016-02-02 ENCOUNTER — Emergency Department (HOSPITAL_COMMUNITY): Payer: Medicare Other

## 2016-02-02 ENCOUNTER — Encounter (HOSPITAL_COMMUNITY): Payer: Self-pay | Admitting: *Deleted

## 2016-02-02 ENCOUNTER — Emergency Department (HOSPITAL_COMMUNITY)
Admission: EM | Admit: 2016-02-02 | Discharge: 2016-02-02 | Disposition: A | Discharge status: Home / Self Care | Payer: Medicare Other | Attending: Emergency Medicine | Admitting: Emergency Medicine

## 2016-02-02 ENCOUNTER — Telehealth: Payer: Self-pay | Admitting: *Deleted

## 2016-02-02 DIAGNOSIS — R011 Cardiac murmur, unspecified: Secondary | ICD-10-CM | POA: Insufficient documentation

## 2016-02-02 DIAGNOSIS — Z87448 Personal history of other diseases of urinary system: Secondary | ICD-10-CM | POA: Diagnosis not present

## 2016-02-02 DIAGNOSIS — G309 Alzheimer's disease, unspecified: Secondary | ICD-10-CM | POA: Insufficient documentation

## 2016-02-02 DIAGNOSIS — R0989 Other specified symptoms and signs involving the circulatory and respiratory systems: Secondary | ICD-10-CM

## 2016-02-02 DIAGNOSIS — Z8639 Personal history of other endocrine, nutritional and metabolic disease: Secondary | ICD-10-CM | POA: Insufficient documentation

## 2016-02-02 DIAGNOSIS — Z79899 Other long term (current) drug therapy: Secondary | ICD-10-CM | POA: Insufficient documentation

## 2016-02-02 DIAGNOSIS — Z8619 Personal history of other infectious and parasitic diseases: Secondary | ICD-10-CM | POA: Insufficient documentation

## 2016-02-02 DIAGNOSIS — M1990 Unspecified osteoarthritis, unspecified site: Secondary | ICD-10-CM | POA: Diagnosis not present

## 2016-02-02 DIAGNOSIS — I10 Essential (primary) hypertension: Secondary | ICD-10-CM | POA: Insufficient documentation

## 2016-02-02 DIAGNOSIS — Z8701 Personal history of pneumonia (recurrent): Secondary | ICD-10-CM | POA: Diagnosis not present

## 2016-02-02 DIAGNOSIS — Z8719 Personal history of other diseases of the digestive system: Secondary | ICD-10-CM | POA: Diagnosis not present

## 2016-02-02 DIAGNOSIS — Z872 Personal history of diseases of the skin and subcutaneous tissue: Secondary | ICD-10-CM | POA: Diagnosis not present

## 2016-02-02 DIAGNOSIS — Z8659 Personal history of other mental and behavioral disorders: Secondary | ICD-10-CM | POA: Diagnosis not present

## 2016-02-02 LAB — BASIC METABOLIC PANEL
Anion gap: 10 (ref 5–15)
BUN: 23 mg/dL — AB (ref 6–20)
CO2: 23 mmol/L (ref 22–32)
CREATININE: 0.67 mg/dL (ref 0.44–1.00)
Calcium: 9.8 mg/dL (ref 8.9–10.3)
Chloride: 107 mmol/L (ref 101–111)
Glucose, Bld: 105 mg/dL — ABNORMAL HIGH (ref 65–99)
Potassium: 4.1 mmol/L (ref 3.5–5.1)
SODIUM: 140 mmol/L (ref 135–145)

## 2016-02-02 LAB — CBC
HEMATOCRIT: 29.4 % — AB (ref 36.0–46.0)
HEMOGLOBIN: 9.5 g/dL — AB (ref 12.0–15.0)
MCH: 26.8 pg (ref 26.0–34.0)
MCHC: 32.3 g/dL (ref 30.0–36.0)
MCV: 83.1 fL (ref 78.0–100.0)
PLATELETS: 255 10*3/uL (ref 150–400)
RBC: 3.54 MIL/uL — AB (ref 3.87–5.11)
RDW: 18.6 % — AB (ref 11.5–15.5)
WBC: 11.8 10*3/uL — ABNORMAL HIGH (ref 4.0–10.5)

## 2016-02-02 NOTE — ED Notes (Signed)
Per EMS-had portable CXR at home-diagnosed with right lower lobe fluid

## 2016-02-02 NOTE — Telephone Encounter (Signed)
[name] [facility] Nurse) called this morning to report that Sabrina Long has fluid on her lungs and wanted a appointment in the office. She stated that they didn't want to take he to the hospital, but I informed her that there was nothing that we could do, so it would be best that they take her to the hospital.

## 2016-02-02 NOTE — ED Notes (Signed)
Per EMS report: pt coming home: pt had a portable x-ray of her chest at home on Monday April 10 and the report shows fluid in the right lower lung.  At baseline, pt breathes shallow, aphasic, and stage 4 dementia. Not on home O2 and no dx of CHF.  Pt has a DNR. Pt has a chronic foley catheter along with a wound vac on the left hip. Packing is due to be changed.

## 2016-02-02 NOTE — ED Provider Notes (Signed)
CSN: [id number]     Arrival date & time 02/02/16  0914 History   First MD Initiated Contact with Patient 02/02/16 [phone]     Chief Complaint  Patient presents with  . fluid on lungs      Level V caveat: Dementia  HPI Patient is brought to the emergency department at the request of her family after she was noted to have abnormal lung sounds yesterday per home health nurse and an outpatient chest x-ray was ordered which demonstrated possible "fluid".  Family reports that she's been in normal state of health's been eating and drinking normally.  She has not had fevers or chills.  She has not been coughing.   Past Medical History  Diagnosis Date  . Constipation   . Anxiety state   . Colitis presumed to be due to infection 11/02/2011    enteritis, and gastroenteritis  . Pneumonia 11/02/2011  . Cystitis   . Impetigo   . Renal artery stenosis (HCC)   . Dementia with behavioral disturbance   . Hypercalcemia   . Reflux esophagitis   . Chest pain, unspecified 08/19/2008  . Abnormality of gait   . Hyperosmolality and/or hypernatremia   . Liver disorder   . Candidiasis of vulva and vagina   . Alzheimer's disease   . Depressive disorder   . Hyperlipidemia   . Weight loss   . Reactive confusion   . Hypertension, essential   . Seborrheic keratosis   . Osteoarthrosis   . Stiffness of joint   . Memory loss 07/24/2007  . Headache   . Undiagnosed cardiac murmurs   . Seizures [facility](HCC)    Past Surgical History  Procedure Laterality Date  . Cesarean section     No family history on file. Social History  Substance Use Topics  . Smoking status: Never Smoker   . Smokeless tobacco: Never Used  . Alcohol Use: No   OB History    No data available     Review of Systems  All other systems reviewed and are negative.     Allergies  Risperdal  Home Medications   Prior to Admission medications   Medication Sig Start Date End Date Taking? Authorizing Provider  feeding supplement,  ENSURE ENLIVE, (ENSURE ENLIVE) LIQD Take 237 mLs by mouth 3 (three) times daily between meals. Patient taking differently: Take 237 mLs by mouth daily.  02/17/15  Yes Albertine GratesFang Xu, MD  lactose free nutrition (BOOST) LIQD Take 237 mLs by mouth every other day.    Yes Historical Provider, MD  levETIRAcetam (KEPPRA) 100 MG/ML solution Take 5 mLs (500 mg total) by mouth 2 (two) times daily. 12/12/15  Yes Alison MurrayAlma M Devine, MD  losartan (COZAAR) 25 MG tablet TAKE 1 TABLET DAILY FOR BLOOD PRESSURE 01/13/16  Yes [name] L Reed, DO  metoprolol tartrate (LOPRESSOR) 25 MG tablet TAKE ONE-HALF (1/2) TABLET TWICE A DAY Patient taking differently: TAKE 1TABLET TWICE A DAY 01/31/16  Yes [name] L Reed, DO  oxyCODONE (OXY IR/ROXICODONE) 5 MG immediate release tablet Take 1/2 tablet three times daily as needed for pain Patient taking differently: Take 2.5 mg by mouth 3 (three) times daily.  01/21/16  Yes [name] L Reed, DO  AMBULATORY NON FORMULARY MEDICATION Suction Pump Device To help with removing Antiseptic oral rinse Patient not taking: Reported on 02/02/2016 12/22/15   Kermit Baloiffany L Reed, DO  antiseptic oral rinse (CPC / CETYLPYRIDINIUM CHLORIDE 0.05%) 0.05 % LIQD solution 7 mLs by Mouth Rinse route 2 (two) times daily.  Patient not taking: Reported on 02/02/2016 12/12/15   Alison Murray, MD  memantine (NAMENDA) 10 MG tablet TAKE 1 TABLET TWICE A DAY FOR MEMORY LOSS Patient not taking: Reported on 02/02/2016 01/31/16   [name] L Reed, DO  potassium chloride (KLOR-CON) 20 MEQ packet Take 20 mEq by mouth daily. 12/20/15 12/25/15  [name] L Reed, DO  sulfamethoxazole-trimethoprim (BACTRIM DS,SEPTRA DS) 800-160 MG tablet Take 1 tablet by mouth 2 (two) times daily. Patient not taking: Reported on 02/02/2016 01/05/16   Kimber Relic, MD   BP 106/62 mmHg  Pulse 92  Temp(Src) 100.2 F (37.9 C)  Resp 17  SpO2 96% Physical Exam  Constitutional: She appears well-developed and well-nourished. No distress.  HENT:  Head: Normocephalic and  atraumatic.  Eyes: EOM are normal.  Neck: Normal range of motion.  Cardiovascular: Normal rate, regular rhythm and normal heart sounds.   Pulmonary/Chest: Effort normal and breath sounds normal.  Abdominal: Soft. She exhibits no distension. There is no tenderness.  Musculoskeletal: Normal range of motion.  Neurological: She is alert.  Skin: Skin is warm and dry.  Psychiatric: She has a normal mood and affect. Judgment normal.  Nursing note and vitals reviewed.   ED Course  Procedures (including critical care time) Labs Review Labs Reviewed  CBC - Abnormal; Notable for the following:    WBC 11.8 (*)    RBC 3.54 (*)    Hemoglobin 9.5 (*)    HCT 29.4 (*)    RDW 18.6 (*)    All other components within normal limits  BASIC METABOLIC PANEL - Abnormal; Notable for the following:    Glucose, Bld 105 (*)    BUN 23 (*)    All other components within normal limits    HEMOGLOBIN  Date Value Ref Range Status  02/02/2016 9.5* 12.0 - 15.0 g/dL Final  16/07/9603 8.7* 12.0 - 15.0 g/dL Final  54/06/8118 7.6* 12.0 - 15.0 g/dL Final  14/78/2956 8.2* 12.0 - 15.0 g/dL Final     Imaging Review Dg Chest 2 View  02/02/2016  CLINICAL DATA:  Fever.  Dementia. EXAM: CHEST  2 VIEW COMPARISON:  12/05/2015. FINDINGS: The heart size and mediastinal contours are within normal limits. Persistent LEFT lower lobe opacity although improved. Low lung volumes. No effusion or pneumothorax. Osteopenia. IMPRESSION: Improved but persistent LEFT lower lobe opacity. It is unclear if this abnormality contributes to the patient's fever. Electronically Signed   By: Elsie Stain M.D.   On: 02/02/2016 12:34   I have personally reviewed and evaluated these images and lab results as part of my medical decision-making.   EKG Interpretation None      MDM   Final diagnoses:  Abnormal lung sounds    Patient is overall well-appearing.  She's been in her normal state health recently.  Her labs are without significant  abnormality.  Her chest x-rays without acute abnormality.  She does have a small persistent left lower lobe opacity but I do not think it is clinically relevant at this time.  Discharge home with primary care follow-up as needed    Azalia Bilis, MD 02/02/16 1330

## 2016-02-02 NOTE — Telephone Encounter (Signed)
Noted. Thanks.

## 2016-02-02 NOTE — ED Notes (Signed)
Patient's son left number and asked to be informed of any pertinent information - Rande LawmanBrian Highfill - [phone]- [phone].

## 2016-02-02 NOTE — ED Notes (Signed)
Bed: [id number] Expected date:  Expected time:  Means of arrival:  Comments: EMS-abnormal CXR

## 2016-02-02 NOTE — ED Notes (Signed)
[facility] called for patient transport.  Patient's son called and notified.

## 2016-02-11 ENCOUNTER — Encounter (HOSPITAL_BASED_OUTPATIENT_CLINIC_OR_DEPARTMENT_OTHER): Payer: Medicare Other | Attending: Internal Medicine

## 2016-02-11 DIAGNOSIS — G8250 Quadriplegia, unspecified: Secondary | ICD-10-CM | POA: Diagnosis not present

## 2016-02-11 DIAGNOSIS — Z86718 Personal history of other venous thrombosis and embolism: Secondary | ICD-10-CM | POA: Insufficient documentation

## 2016-02-11 DIAGNOSIS — F0280 Dementia in other diseases classified elsewhere without behavioral disturbance: Secondary | ICD-10-CM | POA: Diagnosis not present

## 2016-02-11 DIAGNOSIS — G309 Alzheimer's disease, unspecified: Secondary | ICD-10-CM | POA: Diagnosis not present

## 2016-02-11 DIAGNOSIS — I252 Old myocardial infarction: Secondary | ICD-10-CM | POA: Diagnosis not present

## 2016-02-11 DIAGNOSIS — I10 Essential (primary) hypertension: Secondary | ICD-10-CM | POA: Diagnosis not present

## 2016-02-11 DIAGNOSIS — L89224 Pressure ulcer of left hip, stage 4: Secondary | ICD-10-CM | POA: Insufficient documentation

## 2016-02-11 DIAGNOSIS — L89154 Pressure ulcer of sacral region, stage 4: Secondary | ICD-10-CM | POA: Diagnosis present

## 2016-02-11 DIAGNOSIS — G40909 Epilepsy, unspecified, not intractable, without status epilepticus: Secondary | ICD-10-CM | POA: Insufficient documentation

## 2016-02-14 DIAGNOSIS — L89154 Pressure ulcer of sacral region, stage 4: Secondary | ICD-10-CM

## 2016-02-14 DIAGNOSIS — F0280 Dementia in other diseases classified elsewhere without behavioral disturbance: Secondary | ICD-10-CM

## 2016-02-14 DIAGNOSIS — G309 Alzheimer's disease, unspecified: Secondary | ICD-10-CM

## 2016-02-14 DIAGNOSIS — L89224 Pressure ulcer of left hip, stage 4: Secondary | ICD-10-CM

## 2016-02-18 ENCOUNTER — Telehealth: Payer: Self-pay

## 2016-02-18 NOTE — Telephone Encounter (Signed)
Message left on triage voicemail- please call to give verbal orders for wound care and foley cath management  I called [name] back and gave verbal order per standing protocol at [facility], will send to Dr.Reed as a [place]

## 2016-02-23 ENCOUNTER — Ambulatory Visit: Payer: Medicare Other | Admitting: Podiatry

## 2016-03-01 ENCOUNTER — Encounter: Payer: Self-pay | Admitting: Podiatry

## 2016-03-01 ENCOUNTER — Other Ambulatory Visit: Payer: Self-pay

## 2016-03-01 ENCOUNTER — Ambulatory Visit (INDEPENDENT_AMBULATORY_CARE_PROVIDER_SITE_OTHER): Payer: Medicare Other | Admitting: Podiatry

## 2016-03-01 DIAGNOSIS — B351 Tinea unguium: Secondary | ICD-10-CM

## 2016-03-01 DIAGNOSIS — M79676 Pain in unspecified toe(s): Secondary | ICD-10-CM | POA: Diagnosis not present

## 2016-03-01 DIAGNOSIS — L8990 Pressure ulcer of unspecified site, unspecified stage: Secondary | ICD-10-CM

## 2016-03-01 DIAGNOSIS — I739 Peripheral vascular disease, unspecified: Secondary | ICD-10-CM

## 2016-03-01 MED ORDER — OXYCODONE HCL 5 MG PO TABS
ORAL_TABLET | ORAL | Status: DC
Start: 2016-03-01 — End: 2016-04-03

## 2016-03-01 NOTE — Telephone Encounter (Signed)
Called patient no answer left message that their medication was ready for pick up in the office.

## 2016-03-01 NOTE — Progress Notes (Signed)
Patient ID: Consuello Closssterline P Muniz, female   DOB: 04/16/1936, 80 y.o.   MRN: [id number] HPI  Complaint:  Visit Type: Patient returns to my office for continued preventative foot care services. Complaint: Patient states" my nails have grown long and thick and become painful to walk and wear shoes" . She presents for preventative foot care services. No changes to ROS.  Son says she is under care for wound third toe right foot by the wound center.  Podiatric Exam: Vascular: dorsalis pedis and posterior tibial pulses are negative. Capillary return is immediate. Temperature gradient is negative. Skin turgor WNL,   Sensorium: Normal Semmes Weinstein monofilament test. Normal tactile sensation bilaterally.  Nail Exam: Pt has thick disfigured discolored nails with subungual debris noted bilateral entire nail hallux through fifth toenails Ulcer Exam: There is no evidence of ulcer or pre-ulcerative changes or infection. Orthopedic Exam: Muscle tone and strength are WNL. No limitations in general ROM. No crepitus or effusions noted. Foot type and digits show no abnormalities. Bony prominences are unremarkable. Skin: No Porokeratosis. No infection or ulcers.  .  No evidence of infection noted. Diagnosis:  Tinea unguium, Pain in right toe, pain in left toes  Treatment & Plan Procedures and Treatment: Consent by patient was obtained for treatment procedures. The patient understood the discussion of treatment and procedures well. All questions were answered thoroughly reviewed. Debridement of mycotic and hypertrophic toenails, 1 through 5 bilateral and clearing of subungual debris. No ulceration, no infection noted. Her fourth toenail right toenail was loose and unattached to nail bed. It was dressed with neosporin/DSD. Her third toe has healed and closed.  No drainage. Return Visit-Office Procedure: Patient instructed to return to the office for a follow up visit 3 months for continued evaluation and  treatment.   Helane GuntherGregory Willson Lipa DPM   Helane GuntherGregory Pietrina Jagodzinski DPM

## 2016-03-17 ENCOUNTER — Ambulatory Visit: Payer: Medicare Other | Admitting: Internal Medicine

## 2016-03-17 ENCOUNTER — Encounter: Payer: Self-pay | Admitting: Internal Medicine

## 2016-03-24 ENCOUNTER — Encounter (HOSPITAL_BASED_OUTPATIENT_CLINIC_OR_DEPARTMENT_OTHER): Payer: Medicare Other | Attending: Internal Medicine

## 2016-03-24 DIAGNOSIS — G8250 Quadriplegia, unspecified: Secondary | ICD-10-CM | POA: Insufficient documentation

## 2016-03-24 DIAGNOSIS — I10 Essential (primary) hypertension: Secondary | ICD-10-CM | POA: Insufficient documentation

## 2016-03-24 DIAGNOSIS — L89224 Pressure ulcer of left hip, stage 4: Secondary | ICD-10-CM | POA: Insufficient documentation

## 2016-03-24 DIAGNOSIS — Z86718 Personal history of other venous thrombosis and embolism: Secondary | ICD-10-CM | POA: Insufficient documentation

## 2016-03-24 DIAGNOSIS — M1990 Unspecified osteoarthritis, unspecified site: Secondary | ICD-10-CM | POA: Insufficient documentation

## 2016-03-24 DIAGNOSIS — F0390 Unspecified dementia without behavioral disturbance: Secondary | ICD-10-CM | POA: Diagnosis not present

## 2016-03-24 DIAGNOSIS — L89154 Pressure ulcer of sacral region, stage 4: Secondary | ICD-10-CM | POA: Insufficient documentation

## 2016-03-24 DIAGNOSIS — I252 Old myocardial infarction: Secondary | ICD-10-CM | POA: Insufficient documentation

## 2016-03-27 ENCOUNTER — Encounter: Payer: Self-pay | Admitting: Nurse Practitioner

## 2016-03-27 ENCOUNTER — Ambulatory Visit (INDEPENDENT_AMBULATORY_CARE_PROVIDER_SITE_OTHER): Payer: Medicare Other | Admitting: Nurse Practitioner

## 2016-03-27 VITALS — BP 120/74 | HR 79 | Temp 98.3°F | Resp 17

## 2016-03-27 DIAGNOSIS — F03918 Unspecified dementia, unspecified severity, with other behavioral disturbance: Secondary | ICD-10-CM

## 2016-03-27 DIAGNOSIS — I10 Essential (primary) hypertension: Secondary | ICD-10-CM | POA: Diagnosis not present

## 2016-03-27 DIAGNOSIS — F0391 Unspecified dementia with behavioral disturbance: Secondary | ICD-10-CM | POA: Diagnosis not present

## 2016-03-27 DIAGNOSIS — R569 Unspecified convulsions: Secondary | ICD-10-CM | POA: Diagnosis not present

## 2016-03-27 DIAGNOSIS — L8990 Pressure ulcer of unspecified site, unspecified stage: Secondary | ICD-10-CM

## 2016-03-27 DIAGNOSIS — E43 Unspecified severe protein-calorie malnutrition: Secondary | ICD-10-CM | POA: Diagnosis not present

## 2016-03-27 NOTE — Progress Notes (Signed)
Patient ID: Sabrina Long, female   DOB: [birth year], 80 y.o.   MRN: [id number]    PCP: Bufford Spikes, DO  Advanced Directive information Does patient have an advance directive?: Yes, Type of Advance Directive: Healthcare Power of [place];Living will;Out of facility DNR (pink MOST or yellow form)  Allergies  Allergen Reactions  . Risperdal [Risperidone] Other (See Comments)    sedation    Chief Complaint  Patient presents with  . Medical Management of Chronic Issues    3 month follow-up for med mgt. No complaints as per son  . OTHER    Son, brian, in room with patient     HPI: Patient is a 80 y.o. female seen in the office today for routine follow up on chronic conditions. Pt with a pmh of htn, dementia (end stage), protein calorie malnutrition, hyperlipidemia, seizures, DVT. Pt with end stage dementia who lives with son and has caregivers that take care of her at home. She is nonverbal. Pt did not allow for vital in the office but he took them this morning. Pt nonambulatory and was unable to get weight today. Pts son Arlys John says they are weighting her at wound clinic and she has gained 7 lbs in the last 4-5 months.  Last weight of 121 lb.  Eating well at this time conts with wound vac to hip and sacrum and followed with Dr Leanord Hawking there.  Taking medication well.  No behaviors noted Pain well controlled currently taking 1/2 tablet TID.  No cough or congestion No issues with bowels conts with foley catheter, home health changed wound vac and foley catheter today.    Review of Systems:  Review of Systems  Unable to perform ROS: Dementia    Past Medical History  Diagnosis Date  . Constipation   . Anxiety state   . Colitis presumed to be due to infection 11/02/2011    enteritis, and gastroenteritis  . Pneumonia 11/02/2011  . Cystitis   . Impetigo   . Renal artery stenosis (HCC)   . Dementia with behavioral disturbance   . Hypercalcemia   . Reflux esophagitis   .  Chest pain, unspecified 08/19/2008  . Abnormality of gait   . Hyperosmolality and/or hypernatremia   . Liver disorder   . Candidiasis of vulva and vagina   . Alzheimer's disease   . Depressive disorder   . Hyperlipidemia   . Weight loss   . Reactive confusion   . Hypertension, essential   . Seborrheic keratosis   . Osteoarthrosis   . Stiffness of joint   . Memory loss 07/24/2007  . Headache   . Undiagnosed cardiac murmurs   . Seizures [facility])    Past Surgical History  Procedure Laterality Date  . Cesarean section     Social History:   reports that she has never smoked. She has never used smokeless tobacco. She reports that she does not drink alcohol or use illicit drugs.  History reviewed. No pertinent family history.  Medications: Patient's Medications  New Prescriptions   No medications on file  Previous Medications   AMBULATORY NON FORMULARY MEDICATION    Suction Pump Device To help with removing Antiseptic oral rinse   ANTISEPTIC ORAL RINSE (CPC / CETYLPYRIDINIUM CHLORIDE 0.05%) 0.05 % LIQD SOLUTION    7 mLs by Mouth Rinse route 2 (two) times daily.   FEEDING SUPPLEMENT, ENSURE ENLIVE, (ENSURE ENLIVE) LIQD    Take 237 mLs by mouth 3 (three) times daily between meals.   LEVETIRACETAM (  KEPPRA) 100 MG/ML SOLUTION    Take 5 mLs (500 mg total) by mouth 2 (two) times daily.   LOSARTAN (COZAAR) 25 MG TABLET    TAKE 1 TABLET DAILY FOR BLOOD PRESSURE   MEMANTINE (NAMENDA) 10 MG TABLET    TAKE 1 TABLET TWICE A DAY FOR MEMORY LOSS   METOPROLOL TARTRATE (LOPRESSOR) 25 MG TABLET    TAKE ONE-HALF (1/2) TABLET TWICE A DAY   OXYCODONE (OXY IR/ROXICODONE) 5 MG IMMEDIATE RELEASE TABLET    Take 1/2 tablet three times daily as needed for pain   POTASSIUM CHLORIDE (KLOR-CON) 20 MEQ PACKET    Take 20 mEq by mouth daily.  Modified Medications   No medications on file  Discontinued Medications   LACTOSE FREE NUTRITION (BOOST) LIQD    Take 237 mLs by mouth every other day.     SULFAMETHOXAZOLE-TRIMETHOPRIM (BACTRIM DS,SEPTRA DS) 800-160 MG TABLET    Take 1 tablet by mouth 2 (two) times daily.     Physical Exam:  Filed Vitals:   03/27/16 1307  BP: 120/74  Pulse: 79  Temp: 98.3 F (36.8 C)  TempSrc: Oral  Resp: 17  SpO2: 98%   There is no weight on file to calculate BMI.  Physical Exam  Constitutional:  Frail elderly female, [facility]  HENT:  Head: Normocephalic and atraumatic.  Cardiovascular: Normal rate, regular rhythm and normal heart sounds.   Pulmonary/Chest: Effort normal and breath sounds normal. No respiratory distress.  Abdominal: Soft. Bowel sounds are normal.  Genitourinary:  Foley in place with clear yellow urine  Musculoskeletal:  Flexion contractures of extremities  Neurological: She is alert.  nonverbal  Skin:  pressure ulcers of hips and sacrum with wound vac    Labs reviewed: Basic Metabolic Panel:  Recent Labs  21/30/8601/10/08 1907  12/12/15 0513 12/13/15 0423 12/20/15 1133 02/02/16 1120  NA  --   < > 141 143 154* 140  K  --   < > 3.2* 3.9 4.4 4.1  CL  --   < > 112* 114* 113* 107  CO2  --   < > 20* 22 20 23   GLUCOSE  --   < > 104* 107* 144* 105*  BUN  --   < > 25* 23* 22 23*  CREATININE  --   < > 0.95 0.87 0.74 0.67  CALCIUM  --   < > 8.7* 9.1 10.2 9.8  MG  --   --  1.3*  --  2.1  --   TSH 1.717  --   --   --   --   --   < > = values in this interval not displayed. Liver Function Tests:  Recent Labs  12/05/15 1450 12/06/15 0318 12/20/15 1133  AST 31 27 17   ALT 26 20 16   ALKPHOS 71 66 84  BILITOT 0.4 0.5 0.2  PROT 8.3* 6.7 9.2*  ALBUMIN 3.1* 2.6* 3.7   No results for input(s): LIPASE, AMYLASE in the last 8760 hours. No results for input(s): AMMONIA in the last 8760 hours. CBC:  Recent Labs  06/22/15 1320  07/22/15 1925  12/12/15 0513 12/13/15 0423 12/20/15 1133 02/02/16 1120  WBC 6.7  < > 10.6*  < > 12.2* 11.7* 9.2 11.8*  NEUTROABS 4.3  --  6.2  --   --   --  6.0  --   HGB 10.9*  < > 11.7*   < > 7.6* 8.7*  --  9.5*  HCT 36.2  < >  35.7*  < > 23.5* 27.5* 34.1 29.4*  MCV 95.3  < > 89.7  < > 86.7 85.9 87 83.1  PLT 341  < > 395  < > 263 340 633* 255  < > = values in this interval not displayed. Lipid Panel: No results for input(s): CHOL, HDL, LDLCALC, TRIG, CHOLHDL, LDLDIRECT in the last 8760 hours. TSH:  Recent Labs  12/05/15 1907  TSH 1.717   A1C: Lab Results  Component Value Date   HGBA1C 5.5 02/15/2015     Assessment/Plan 1. Essential hypertension Stable, conts on metoprolol and losartan.   2. Dementia with behavioral disturbance Advanced, end stage dementia. conts on namenda   3. Protein-calorie malnutrition, severe (HCC) -weight has been stable, good PO intake per son. Cont to monitor weight and supplements   4. Seizures (HCC) Stable, No recent seizures conts on keppra   5. Pressure ulcer Stable, following with wound care center and has wound vac. conts home health for dressing change and pain controlled on current regimen. Follow up with Dr Renato Gails in 3 months Janene Harvey. Biagio Borg  [facility] [phone] 8 am - 5 pm) [phone] (after hours)

## 2016-04-03 ENCOUNTER — Ambulatory Visit: Payer: Medicare Other | Admitting: Internal Medicine

## 2016-04-03 ENCOUNTER — Other Ambulatory Visit: Payer: Self-pay | Admitting: *Deleted

## 2016-04-03 DIAGNOSIS — L8990 Pressure ulcer of unspecified site, unspecified stage: Secondary | ICD-10-CM

## 2016-04-03 MED ORDER — OXYCODONE HCL 5 MG PO TABS: ORAL_TABLET | ORAL | Status: DC

## 2016-04-03 NOTE — Telephone Encounter (Signed)
Spoke with patient's son and advised results, rx ready for pick-up

## 2016-04-03 NOTE — Telephone Encounter (Signed)
Patient son, Sabrina Long requested and will pick up

## 2016-04-12 ENCOUNTER — Other Ambulatory Visit: Payer: Self-pay | Admitting: Internal Medicine

## 2016-04-14 DIAGNOSIS — L89224 Pressure ulcer of left hip, stage 4: Secondary | ICD-10-CM

## 2016-04-14 DIAGNOSIS — L89154 Pressure ulcer of sacral region, stage 4: Secondary | ICD-10-CM

## 2016-04-14 DIAGNOSIS — F0280 Dementia in other diseases classified elsewhere without behavioral disturbance: Secondary | ICD-10-CM

## 2016-04-14 DIAGNOSIS — G309 Alzheimer's disease, unspecified: Secondary | ICD-10-CM

## 2016-05-02 ENCOUNTER — Other Ambulatory Visit: Payer: Self-pay | Admitting: *Deleted

## 2016-05-02 DIAGNOSIS — L8990 Pressure ulcer of unspecified site, unspecified stage: Secondary | ICD-10-CM

## 2016-05-02 MED ORDER — OXYCODONE HCL 5 MG PO TABS: ORAL_TABLET | ORAL | Status: DC

## 2016-05-02 NOTE — Telephone Encounter (Signed)
Patient son, [name] requested and will pick up 

## 2016-05-05 ENCOUNTER — Encounter (HOSPITAL_BASED_OUTPATIENT_CLINIC_OR_DEPARTMENT_OTHER): Payer: Medicare Other | Attending: Internal Medicine

## 2016-05-05 DIAGNOSIS — I10 Essential (primary) hypertension: Secondary | ICD-10-CM | POA: Insufficient documentation

## 2016-05-05 DIAGNOSIS — G40909 Epilepsy, unspecified, not intractable, without status epilepticus: Secondary | ICD-10-CM | POA: Insufficient documentation

## 2016-05-05 DIAGNOSIS — Z86718 Personal history of other venous thrombosis and embolism: Secondary | ICD-10-CM | POA: Insufficient documentation

## 2016-05-05 DIAGNOSIS — L89154 Pressure ulcer of sacral region, stage 4: Secondary | ICD-10-CM | POA: Insufficient documentation

## 2016-05-05 DIAGNOSIS — F0390 Unspecified dementia without behavioral disturbance: Secondary | ICD-10-CM | POA: Insufficient documentation

## 2016-05-05 DIAGNOSIS — I252 Old myocardial infarction: Secondary | ICD-10-CM | POA: Insufficient documentation

## 2016-05-05 DIAGNOSIS — L89323 Pressure ulcer of left buttock, stage 3: Secondary | ICD-10-CM | POA: Insufficient documentation

## 2016-05-05 DIAGNOSIS — G8250 Quadriplegia, unspecified: Secondary | ICD-10-CM | POA: Insufficient documentation

## 2016-05-10 ENCOUNTER — Ambulatory Visit: Payer: Medicare Other | Admitting: Podiatry

## 2016-05-12 DIAGNOSIS — I10 Essential (primary) hypertension: Secondary | ICD-10-CM | POA: Diagnosis not present

## 2016-05-12 DIAGNOSIS — G8250 Quadriplegia, unspecified: Secondary | ICD-10-CM | POA: Diagnosis not present

## 2016-05-12 DIAGNOSIS — Z86718 Personal history of other venous thrombosis and embolism: Secondary | ICD-10-CM | POA: Diagnosis not present

## 2016-05-12 DIAGNOSIS — L89154 Pressure ulcer of sacral region, stage 4: Secondary | ICD-10-CM | POA: Diagnosis not present

## 2016-05-12 DIAGNOSIS — L89323 Pressure ulcer of left buttock, stage 3: Secondary | ICD-10-CM | POA: Diagnosis not present

## 2016-05-12 DIAGNOSIS — G40909 Epilepsy, unspecified, not intractable, without status epilepticus: Secondary | ICD-10-CM | POA: Diagnosis not present

## 2016-05-12 DIAGNOSIS — I252 Old myocardial infarction: Secondary | ICD-10-CM | POA: Diagnosis not present

## 2016-05-12 DIAGNOSIS — F0390 Unspecified dementia without behavioral disturbance: Secondary | ICD-10-CM | POA: Diagnosis not present

## 2016-05-16 ENCOUNTER — Telehealth: Payer: Self-pay | Admitting: *Deleted

## 2016-05-16 NOTE — Telephone Encounter (Signed)
Sabrina Long with Sabrina Long called and requested verbal orders for nursing 3xweekly.

## 2016-05-16 NOTE — Telephone Encounter (Signed)
Verbal orders given  

## 2016-05-24 ENCOUNTER — Telehealth: Payer: Self-pay | Admitting: *Deleted

## 2016-05-24 MED ORDER — PRO-STAT 64 PO LIQD
30.0000 mL | Freq: Three times a day (TID) | ORAL | 5 refills | Status: DC
Start: 2016-05-24 — End: 2016-05-26

## 2016-05-24 NOTE — Telephone Encounter (Signed)
Sabrina Long with [name] called and wanted to know if Dr. Renato Gails would call in a Rx for some Prostat for her wounds to pump up the patient's protein level. Would like for [facility] to be called into [facility]. Please Advise.

## 2016-05-24 NOTE — Telephone Encounter (Signed)
[facility]stat was [facility].

## 2016-05-25 NOTE — Telephone Encounter (Signed)
Spoke with Crystal @ [facility] and advised rx sent to pharmacy, also per crystal they are changing pt's cathter from a 16 to a 18 just FYI if you see the order.

## 2016-05-26 ENCOUNTER — Telehealth: Payer: Self-pay

## 2016-05-26 MED ORDER — PRO-STAT 64 PO LIQD: 30.0000 mL | Freq: Three times a day (TID) | ORAL | 5 refills | Status: AC

## 2016-05-26 NOTE — Telephone Encounter (Signed)
Received message on voice mail that Prostat was to be called into [facility] on [address]. The message from 05/24/16 Rx was [facility]. Returned Chistine call [phone] left message that [facility] is [facility]. Called son Arlys John and told [facility] was [facility]. He asked to call it to [facility] because it [facility] times more [facility]. Faxed it to [employer].

## 2016-05-30 ENCOUNTER — Other Ambulatory Visit: Payer: Self-pay | Admitting: Internal Medicine

## 2016-06-05 ENCOUNTER — Other Ambulatory Visit: Payer: Self-pay | Admitting: *Deleted

## 2016-06-05 DIAGNOSIS — L8990 Pressure ulcer of unspecified site, unspecified stage: Secondary | ICD-10-CM

## 2016-06-05 MED ORDER — OXYCODONE HCL 5 MG PO TABS: ORAL_TABLET | ORAL | 0 refills | Status: DC

## 2016-06-05 NOTE — Telephone Encounter (Signed)
[name], son requested and will pick up

## 2016-06-05 NOTE — Telephone Encounter (Signed)
[name] requested and will pick up. Reprinted due to no print.

## 2016-06-05 NOTE — Telephone Encounter (Signed)
Patient's son aware rx available for pick-up

## 2016-06-13 DIAGNOSIS — L89154 Pressure ulcer of sacral region, stage 4: Secondary | ICD-10-CM

## 2016-06-13 DIAGNOSIS — Z466 Encounter for fitting and adjustment of urinary device: Secondary | ICD-10-CM | POA: Diagnosis not present

## 2016-06-13 DIAGNOSIS — F0280 Dementia in other diseases classified elsewhere without behavioral disturbance: Secondary | ICD-10-CM

## 2016-06-13 DIAGNOSIS — I10 Essential (primary) hypertension: Secondary | ICD-10-CM | POA: Diagnosis not present

## 2016-06-13 DIAGNOSIS — M24561 Contracture, right knee: Secondary | ICD-10-CM | POA: Diagnosis not present

## 2016-06-13 DIAGNOSIS — M24562 Contracture, left knee: Secondary | ICD-10-CM | POA: Diagnosis not present

## 2016-06-13 DIAGNOSIS — L89224 Pressure ulcer of left hip, stage 4: Secondary | ICD-10-CM

## 2016-06-13 DIAGNOSIS — G309 Alzheimer's disease, unspecified: Secondary | ICD-10-CM

## 2016-06-29 ENCOUNTER — Telehealth: Payer: Self-pay

## 2016-06-29 NOTE — Telephone Encounter (Signed)
Left message on machine with Crystal @[facility]  to increase to size 22 per son's request. Asked Crystal to return my call to acknowledge she received my message.  Crystal called back as I was hanging up, per Crystal she is changing pt's catheters twice a week and right now she will have order the 22 catheter, so the son will need to keep pt in diapers until it comes in. Also per nurse the son gets paid to care for his mother so she's not sure if he's making the best decision for him or the patient.

## 2016-06-29 NOTE — Telephone Encounter (Signed)
Ok to increase catheter size to 22.  Pt is not in the condition to have a gynecologic/urologic eval to check for prolapse at this point.  It would be too much for her to go through with her contractures of her legs

## 2016-06-29 NOTE — Telephone Encounter (Signed)
Message left on triage voicemail: Patient's catheter is leaking, the size of the catheter was increased from 16 to 18 to 20. The highest size available is 26. [name] will not further increase size without an order to do so.   Crystal questions if patient has a bladder issue (prolaspe bladder) resulting in leaking catheter.   Patient's son has asked that they continue to increase the size. Will need order if you agree.  Please advise , pending appointment 07-03-16

## 2016-06-29 NOTE — Telephone Encounter (Signed)
I spoke with Sabrina Long, Sabrina Long verbalized understanding of Dr.Reed's response and stated that the catheter is placed as a convenience for the family.   Sacral wounds are small and patient's family will need to keep patient dry to keep wounds in this in this condition.    I called Sabrina Long (patient's son) He is requesting that Dr.Reed have the nurse increase the size to 22, see if the leakage continues and go from there. Patient was without the catheters before and it made her wounds worse.  Dr.Reed please give a final say on this situation to [facility])

## 2016-06-29 NOTE — Telephone Encounter (Signed)
I wonder if we should continue using the catheters at all if there is continuous leakage.  The intention was to keep her wounds clean when the catheter was started.  Let's d/c the catheter and monitor for retention.

## 2016-06-29 NOTE — Telephone Encounter (Signed)
Per nurse no matter what size they use the leaking will still happen around the prolaspe, sometimes it causes the patient to have a bowel movement. Please advise

## 2016-07-03 ENCOUNTER — Encounter: Payer: Self-pay | Admitting: Internal Medicine

## 2016-07-03 ENCOUNTER — Ambulatory Visit (INDEPENDENT_AMBULATORY_CARE_PROVIDER_SITE_OTHER): Payer: Medicare Other | Admitting: Internal Medicine

## 2016-07-03 VITALS — BP 120/67 | HR 73

## 2016-07-03 DIAGNOSIS — Z23 Encounter for immunization: Secondary | ICD-10-CM

## 2016-07-03 DIAGNOSIS — Z960 Presence of urogenital implants: Secondary | ICD-10-CM

## 2016-07-03 DIAGNOSIS — R569 Unspecified convulsions: Secondary | ICD-10-CM | POA: Diagnosis not present

## 2016-07-03 DIAGNOSIS — Z9289 Personal history of other medical treatment: Secondary | ICD-10-CM | POA: Diagnosis not present

## 2016-07-03 DIAGNOSIS — E43 Unspecified severe protein-calorie malnutrition: Secondary | ICD-10-CM

## 2016-07-03 DIAGNOSIS — F03918 Unspecified dementia, unspecified severity, with other behavioral disturbance: Secondary | ICD-10-CM

## 2016-07-03 DIAGNOSIS — L8990 Pressure ulcer of unspecified site, unspecified stage: Secondary | ICD-10-CM | POA: Diagnosis not present

## 2016-07-03 DIAGNOSIS — I10 Essential (primary) hypertension: Secondary | ICD-10-CM

## 2016-07-03 DIAGNOSIS — Z978 Presence of other specified devices: Secondary | ICD-10-CM | POA: Insufficient documentation

## 2016-07-03 DIAGNOSIS — F0391 Unspecified dementia with behavioral disturbance: Secondary | ICD-10-CM

## 2016-07-03 MED ORDER — LEVETIRACETAM 100 MG/ML PO SOLN: 500.0000 mg | Freq: Two times a day (BID) | ORAL | 3 refills | Status: AC

## 2016-07-03 MED ORDER — OXYCODONE HCL 5 MG PO TABS: ORAL_TABLET | ORAL | 0 refills | Status: DC

## 2016-07-03 MED ORDER — LEVETIRACETAM 100 MG/ML PO SOLN: 500.0000 mg | Freq: Two times a day (BID) | ORAL | 3 refills | Status: DC

## 2016-07-03 NOTE — Progress Notes (Signed)
Location:  [facility] clinic Provider:  Darold Miley L. Renato Gails, D.O., C.M.D.  Code Status: DNR Goals of Care:  Advanced Directives 07/03/2016  Does patient have an advance directive? Yes  Type of Advance Directive Healthcare Power of Attorney  Does patient want to make changes to advanced directive? -  Copy of advanced directive(s) in chart? Yes  Would patient like information on creating an advanced directive? -  Pre-existing out of facility DNR order (yellow form or pink MOST form) -   Chief Complaint  Patient presents with  . Medical Management of Chronic Issues    follow-up, concerns about cathether   HPI: Patient is a 80 y.o. female seen today for medical management of chronic diseases.  She has terminal stage alzheimer's dementia with contractures, pressure ulcers, nonverbal, nonambulatory status.  She certainly qualifies for hospice care, but family has refused on several occasions.    I've been receiving messages from home health about how pt's catheters are continually leaking no matter what size french is used--she suspects bladder prolapse contributing.  Pt is not in the condition to have a gyn exam, surgery or a pessary placed.  Her son is adamant about keeping the catheter b/c the wounds are better without it (they have improved per home health nurse).  Home health has concerns about family caregivers not changing the patient regularly and allowing her to sit in urine and suspects that is why family wants the catheter kept in place.    Still has wound vac for her pressure ulcer.   They are still not ready for hospice care.  They do have DNR.  He has 4 people helping to look after her.    Past Medical History:  Diagnosis Date  . Abnormality of gait   . Alzheimer's disease   . Anxiety state   . Candidiasis of vulva and vagina   . Chest pain, unspecified 08/19/2008  . Colitis presumed to be due to infection 11/02/2011   enteritis, and gastroenteritis  . Constipation   . Cystitis    . Dementia with behavioral disturbance   . Depressive disorder   . Headache   . Hypercalcemia   . Hyperlipidemia   . Hyperosmolality and/or hypernatremia   . Hypertension, essential   . Impetigo   . Liver disorder   . Memory loss 07/24/2007  . Osteoarthrosis   . Pneumonia 11/02/2011  . Reactive confusion   . Reflux esophagitis   . Renal artery stenosis (HCC)   . Seborrheic keratosis   . Seizures (HCC)   . Stiffness of joint   . Undiagnosed cardiac murmurs   . Weight loss     Past Surgical History:  Procedure Laterality Date  . CESAREAN SECTION      Allergies  Allergen Reactions  . Risperdal [Risperidone] Other (See Comments)    sedation      Medication List       Accurate as of 07/03/16 10:41 AM. Always use your most recent med list.          alendronate 70 MG tablet Commonly known as:  FOSAMAX TAKE 1 TABLET ONCE A WEEK ON TUESDAYS WITH A FULL GLASS OF WATER ON AN EMPTY STOMACH   AMBULATORY NON FORMULARY MEDICATION Suction Pump Device To help with removing Antiseptic oral rinse   antiseptic oral rinse 0.05 % Liqd solution Commonly known as:  CPC / CETYLPYRIDINIUM CHLORIDE 0.05% 7 mLs by Mouth Rinse route 2 (two) times daily.   ENSURE PLUS Liqd Take 237 mLs  by mouth 3 (three) times daily between meals.   feeding supplement (PRO-STAT 64) Liqd Take 30 mLs by mouth 3 (three) times daily with meals.   levETIRAcetam 100 MG/ML solution Commonly known as:  KEPPRA Take 5 mLs (500 mg total) by mouth 2 (two) times daily.   losartan 25 MG tablet Commonly known as:  COZAAR TAKE 1 TABLET DAILY FOR BLOOD PRESSURE   memantine 10 MG tablet Commonly known as:  NAMENDA TAKE 1 TABLET TWICE A DAY FOR MEMORY LOSS   metoprolol tartrate 25 MG tablet Commonly known as:  LOPRESSOR Take 25 mg by mouth 2 (two) times daily.   oxyCODONE 5 MG immediate release tablet Commonly known as:  Oxy IR/ROXICODONE Take 1/2 tablet three times daily as needed for pain        Review of Systems:  Review of Systems  Unable to perform ROS: Dementia  nonverbal, terminal stage  Health Maintenance  Topic Date Due  . FOOT EXAM  06/10/1948  . OPHTHALMOLOGY EXAM  06/10/1948  . TETANUS/TDAP  06/10/1957  . ZOSTAVAX  06/10/1998  . PNA vac Low Risk Adult (2 of 2 - PCV13) 08/01/2015  . HEMOGLOBIN A1C  08/17/2015  . INFLUENZA VACCINE  05/23/2016  . DEXA SCAN  Completed    Physical Exam: Vitals:   07/03/16 1020  BP: 120/67  Pulse: 73  SpO2: 98%   There is no height or weight on file to calculate BMI. Physical Exam  Constitutional:  Cachectic female, nonverbal, not opening eyes (son reports giving oxycodone this am b/c she's getting her wound vac changed later)  Cardiovascular:  irreg irreg  Pulmonary/Chest: Effort normal and breath sounds normal.  Abdominal: Bowel sounds are normal.  Musculoskeletal:  Contractures of hips, knees  Neurological:  Lethargic today  Skin:  Pressure ulcer on buttock with wound vac--sees wound care center   Labs reviewed: Basic Metabolic Panel:  Recent Labs  65/78/4601/10/08 1907  12/12/15 0513 12/13/15 0423 12/20/15 1133 02/02/16 1120  NA  --   < > 141 143 154* 140  K  --   < > 3.2* 3.9 4.4 4.1  CL  --   < > 112* 114* 113* 107  CO2  --   < > 20* 22 20 23   GLUCOSE  --   < > 104* 107* 144* 105*  BUN  --   < > 25* 23* 22 23*  CREATININE  --   < > 0.95 0.87 0.74 0.67  CALCIUM  --   < > 8.7* 9.1 10.2 9.8  MG  --   --  1.3*  --  2.1  --   TSH 1.717  --   --   --   --   --   < > = values in this interval not displayed. Liver Function Tests:  Recent Labs  12/05/15 1450 12/06/15 0318 12/20/15 1133  AST 31 27 17   ALT 26 20 16   ALKPHOS 71 66 84  BILITOT 0.4 0.5 0.2  PROT 8.3* 6.7 9.2*  ALBUMIN 3.1* 2.6* 3.7   No results for input(s): LIPASE, AMYLASE in the last 8760 hours. No results for input(s): AMMONIA in the last 8760 hours. CBC:  Recent Labs  07/22/15 1925  12/12/15 0513 12/13/15 0423 12/20/15 1133  02/02/16 1120  WBC 10.6*  < > 12.2* 11.7* 9.2 11.8*  NEUTROABS 6.2  --   --   --  6.0  --   HGB 11.7*  < > 7.6* 8.7*  --  9.5*  HCT 35.7*  < > 23.5* 27.5* 34.1 29.4*  MCV 89.7  < > 86.7 85.9 87 83.1  PLT 395  < > 263 340 633* 255  < > = values in this interval not displayed. Lipid Panel: No results for input(s): CHOL, HDL, LDLCALC, TRIG, CHOLHDL, LDLDIRECT in the last 8760 hours. Lab Results  Component Value Date   HGBA1C 5.5 02/15/2015    Assessment/Plan 1. Dementia with behavioral disturbance -is terminal stage, pt nonverbal, cannot be weighed, has pressure ulcers, contractures and extremely frail and cachectic -again discussed hospice care today, but son still refuses  2. Pressure ulcer - with wound vac, to be changed today  - follows with [facility] and son is aware it will probably never heal, but thinks the vac should be kept to prevent infections -wants catheter kept to prevent urinary contamination of wound (no other indication is present) - oxyCODONE (OXY IR/ROXICODONE) 5 MG immediate release tablet; Take 1/2 tablet three times daily as needed for pain  Dispense: 45 tablet; Refill: 0--this is used before wound vac changes, catheter changes  3. Protein-calorie malnutrition, severe (HCC) -son says she eats well and has regular bms  4. Essential hypertension -bp at goal, no changes  5. Foley catheter in place -increase french again to try to prevent leakage -advised against putting pt through gyn exam and pessary placement for possible bladder prolapse considering her frail state and risk of another site of infection -favored d/c catheter, but son refuses due to bad effects on pressure ulcer before--would need more consistent changing and turning than currently able to do  6. Seizures (HCC) -no recent events, keppra refilled  7.  Need for influenza -given today  Labs/tests ordered:  No orders of the defined types were placed in this encounter. labs inappropriate for pt  with her severe illness  Next appt: 1 month for prevnar, 3 mos for med mgt  Telena Peyser L. Bee Marchiano, D.O. Geriatrics [facility] [address], [place] [address] Cell Phone (Mon-Fri 8am-5pm):  [phone] On Call:  [phone] & follow prompts after 5pm & weekends Office Phone:  [phone] Office Fax:  [phone]

## 2016-07-06 ENCOUNTER — Ambulatory Visit (INDEPENDENT_AMBULATORY_CARE_PROVIDER_SITE_OTHER): Payer: Medicare Other | Admitting: Podiatry

## 2016-07-06 ENCOUNTER — Encounter: Payer: Self-pay | Admitting: Podiatry

## 2016-07-06 VITALS — BP 120/67 | HR 70

## 2016-07-06 DIAGNOSIS — I739 Peripheral vascular disease, unspecified: Secondary | ICD-10-CM

## 2016-07-06 DIAGNOSIS — B351 Tinea unguium: Secondary | ICD-10-CM | POA: Diagnosis not present

## 2016-07-06 DIAGNOSIS — M79676 Pain in unspecified toe(s): Secondary | ICD-10-CM | POA: Diagnosis not present

## 2016-07-06 NOTE — Progress Notes (Signed)
Patient ID: Sabrina Long, female   DOB: 1936-03-26, 80 y.o.   MRN: [id number] HPI  Complaint:  Visit Type: Patient returns to my office for continued preventative foot care services. Complaint: Patient states" my nails have grown long and thick and become painful to walk and wear shoes" . She presents for preventative foot care services. No changes to ROS.  Son says she is under care for wound third toe right foot by the wound center.  Podiatric Exam: Vascular: dorsalis pedis and posterior tibial pulses are negative. Capillary return is immediate. Temperature gradient is negative. Skin turgor WNL,   Sensorium: Normal Semmes Weinstein monofilament test. Normal tactile sensation bilaterally.  Nail Exam: Pt has thick disfigured discolored nails with subungual debris noted bilateral entire nail hallux through fifth toenails Ulcer Exam: There is no evidence of ulcer or pre-ulcerative changes or infection. Orthopedic Exam: Muscle tone and strength are WNL. No limitations in general ROM. No crepitus or effusions noted. Foot type and digits show no abnormalities. Bony prominences are unremarkable. Skin: No Porokeratosis. No infection or ulcers.  .  No evidence of infection noted. Diagnosis:  Tinea unguium, Pain in right toe, pain in left toes  Treatment & Plan Procedures and Treatment: Consent by patient was obtained for treatment procedures. The patient understood the discussion of treatment and procedures well. All questions were answered thoroughly reviewed. Debridement of mycotic and hypertrophic toenails, 1 through 5 bilateral and clearing of subungual debris. No ulceration, no infection noted. Her fourth toenail right toenail was loose and unattached to nail bed. It was dressed with neosporin/DSD. Her third toe has healed and closed.  No drainage. Return Visit-Office Procedure: Patient instructed to return to the office for a follow up visit 3 months for continued evaluation and  treatment.   Helane GuntherGregory Amorette Charrette DPM   Helane GuntherGregory Leticia Mcdiarmid DPM

## 2016-07-07 ENCOUNTER — Encounter (HOSPITAL_BASED_OUTPATIENT_CLINIC_OR_DEPARTMENT_OTHER): Payer: Medicare Other | Attending: Internal Medicine

## 2016-07-07 DIAGNOSIS — G8250 Quadriplegia, unspecified: Secondary | ICD-10-CM | POA: Diagnosis not present

## 2016-07-07 DIAGNOSIS — L89224 Pressure ulcer of left hip, stage 4: Secondary | ICD-10-CM | POA: Insufficient documentation

## 2016-07-07 DIAGNOSIS — F0390 Unspecified dementia without behavioral disturbance: Secondary | ICD-10-CM | POA: Diagnosis not present

## 2016-07-07 DIAGNOSIS — I252 Old myocardial infarction: Secondary | ICD-10-CM | POA: Insufficient documentation

## 2016-07-07 DIAGNOSIS — L89323 Pressure ulcer of left buttock, stage 3: Secondary | ICD-10-CM | POA: Insufficient documentation

## 2016-07-07 DIAGNOSIS — G40909 Epilepsy, unspecified, not intractable, without status epilepticus: Secondary | ICD-10-CM | POA: Diagnosis not present

## 2016-07-07 DIAGNOSIS — L89154 Pressure ulcer of sacral region, stage 4: Secondary | ICD-10-CM | POA: Diagnosis present

## 2016-07-07 DIAGNOSIS — Z86718 Personal history of other venous thrombosis and embolism: Secondary | ICD-10-CM | POA: Insufficient documentation

## 2016-08-03 ENCOUNTER — Other Ambulatory Visit: Payer: Self-pay

## 2016-08-03 MED ORDER — OXYCODONE HCL 5 MG PO TABS: ORAL_TABLET | ORAL | 0 refills | Status: DC

## 2016-08-03 NOTE — Telephone Encounter (Signed)
Spoke with patient and advised rx ready for pick-up and it will be at the front desk.  

## 2016-08-07 ENCOUNTER — Ambulatory Visit: Payer: Medicare Other

## 2016-08-12 DIAGNOSIS — L89154 Pressure ulcer of sacral region, stage 4: Secondary | ICD-10-CM

## 2016-08-12 DIAGNOSIS — F0280 Dementia in other diseases classified elsewhere without behavioral disturbance: Secondary | ICD-10-CM

## 2016-08-12 DIAGNOSIS — G309 Alzheimer's disease, unspecified: Secondary | ICD-10-CM

## 2016-08-12 DIAGNOSIS — L89224 Pressure ulcer of left hip, stage 4: Secondary | ICD-10-CM

## 2016-08-12 DIAGNOSIS — M24562 Contracture, left knee: Secondary | ICD-10-CM | POA: Diagnosis not present

## 2016-08-12 DIAGNOSIS — I10 Essential (primary) hypertension: Secondary | ICD-10-CM | POA: Diagnosis not present

## 2016-08-12 DIAGNOSIS — M24561 Contracture, right knee: Secondary | ICD-10-CM | POA: Diagnosis not present

## 2016-08-12 DIAGNOSIS — Z466 Encounter for fitting and adjustment of urinary device: Secondary | ICD-10-CM | POA: Diagnosis not present

## 2016-09-04 ENCOUNTER — Other Ambulatory Visit: Payer: Self-pay | Admitting: *Deleted

## 2016-09-04 MED ORDER — OXYCODONE HCL 5 MG PO TABS: ORAL_TABLET | ORAL | 0 refills | Status: DC

## 2016-09-04 NOTE — Telephone Encounter (Signed)
Patient son requested and will pick up 

## 2016-09-08 ENCOUNTER — Encounter (HOSPITAL_BASED_OUTPATIENT_CLINIC_OR_DEPARTMENT_OTHER): Payer: Medicare Other | Attending: Internal Medicine

## 2016-09-08 ENCOUNTER — Other Ambulatory Visit (HOSPITAL_COMMUNITY)
Admission: RE | Admit: 2016-09-08 | Discharge: 2016-09-08 | Disposition: A | Discharge status: Home / Self Care | Payer: Medicare Other | Source: Ambulatory Visit | Attending: Internal Medicine | Admitting: Internal Medicine

## 2016-09-08 DIAGNOSIS — L89150 Pressure ulcer of sacral region, unstageable: Secondary | ICD-10-CM | POA: Insufficient documentation

## 2016-09-08 DIAGNOSIS — G8250 Quadriplegia, unspecified: Secondary | ICD-10-CM | POA: Diagnosis not present

## 2016-09-08 DIAGNOSIS — Z86718 Personal history of other venous thrombosis and embolism: Secondary | ICD-10-CM | POA: Diagnosis not present

## 2016-09-08 DIAGNOSIS — B998 Other infectious disease: Secondary | ICD-10-CM | POA: Diagnosis present

## 2016-09-08 DIAGNOSIS — E46 Unspecified protein-calorie malnutrition: Secondary | ICD-10-CM | POA: Insufficient documentation

## 2016-09-08 DIAGNOSIS — I10 Essential (primary) hypertension: Secondary | ICD-10-CM | POA: Insufficient documentation

## 2016-09-08 DIAGNOSIS — Z993 Dependence on wheelchair: Secondary | ICD-10-CM | POA: Insufficient documentation

## 2016-09-08 DIAGNOSIS — L89320 Pressure ulcer of left buttock, unstageable: Secondary | ICD-10-CM | POA: Insufficient documentation

## 2016-09-08 DIAGNOSIS — L89310 Pressure ulcer of right buttock, unstageable: Secondary | ICD-10-CM | POA: Insufficient documentation

## 2016-09-08 DIAGNOSIS — L89224 Pressure ulcer of left hip, stage 4: Secondary | ICD-10-CM | POA: Diagnosis not present

## 2016-09-08 DIAGNOSIS — F0390 Unspecified dementia without behavioral disturbance: Secondary | ICD-10-CM | POA: Diagnosis not present

## 2016-09-08 DIAGNOSIS — I252 Old myocardial infarction: Secondary | ICD-10-CM | POA: Diagnosis not present

## 2016-09-08 LAB — COMPREHENSIVE METABOLIC PANEL
ALT: 16 U/L (ref 14–54)
ANION GAP: 8 (ref 5–15)
AST: 19 U/L (ref 15–41)
Albumin: 3.5 g/dL (ref 3.5–5.0)
Alkaline Phosphatase: 64 U/L (ref 38–126)
BILIRUBIN TOTAL: 0.5 mg/dL (ref 0.3–1.2)
BUN: 23 mg/dL — AB (ref 6–20)
CHLORIDE: 109 mmol/L (ref 101–111)
CO2: 24 mmol/L (ref 22–32)
Calcium: 9.9 mg/dL (ref 8.9–10.3)
Creatinine, Ser: 0.72 mg/dL (ref 0.44–1.00)
Glucose, Bld: 112 mg/dL — ABNORMAL HIGH (ref 65–99)
POTASSIUM: 3.9 mmol/L (ref 3.5–5.1)
Sodium: 141 mmol/L (ref 135–145)
TOTAL PROTEIN: 8.8 g/dL — AB (ref 6.5–8.1)

## 2016-09-08 LAB — CBC WITH DIFFERENTIAL/PLATELET
BASOS ABS: 0.1 10*3/uL (ref 0.0–0.1)
Basophils Relative: 0 %
EOS PCT: 0 %
Eosinophils Absolute: 0.1 10*3/uL (ref 0.0–0.7)
HCT: 33.8 % — ABNORMAL LOW (ref 36.0–46.0)
Hemoglobin: 11.2 g/dL — ABNORMAL LOW (ref 12.0–15.0)
LYMPHS ABS: 4 10*3/uL (ref 0.7–4.0)
LYMPHS PCT: 26 %
MCH: 29.6 pg (ref 26.0–34.0)
MCHC: 33.1 g/dL (ref 30.0–36.0)
MCV: 89.4 fL (ref 78.0–100.0)
MONO ABS: 1.4 10*3/uL — AB (ref 0.1–1.0)
MONOS PCT: 9 %
NEUTROS ABS: 9.9 10*3/uL — AB (ref 1.7–7.7)
Neutrophils Relative %: 65 %
PLATELETS: 390 10*3/uL (ref 150–400)
RBC: 3.78 MIL/uL — ABNORMAL LOW (ref 3.87–5.11)
RDW: 15.8 % — AB (ref 11.5–15.5)
WBC: 15.4 10*3/uL — ABNORMAL HIGH (ref 4.0–10.5)

## 2016-09-10 IMAGING — CT CT HEAD W/O CM
1 series · 16 of 30 positions shown, 20 images · non-contrast
Comparison: None.

CLINICAL DATA: CVA, history of dementia

EXAM:
CT HEAD WITHOUT CONTRAST
TECHNIQUE: Contiguous axial images were obtained from the base of the skull
through the vertex without intravenous contrast.

[Series 2: head 5.0 h30s · axial · 0.41mm/px · z∈[-223,-73]mm · 16 of 34 slices shown, 20 images]
[im 2/34  brain]
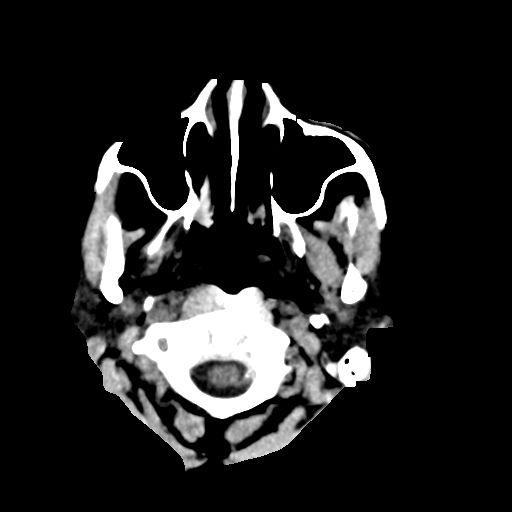
[im 2/34  bone]
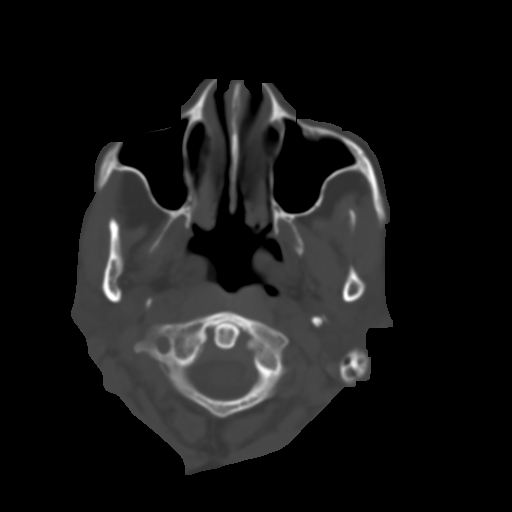
[im 4/34  brain]
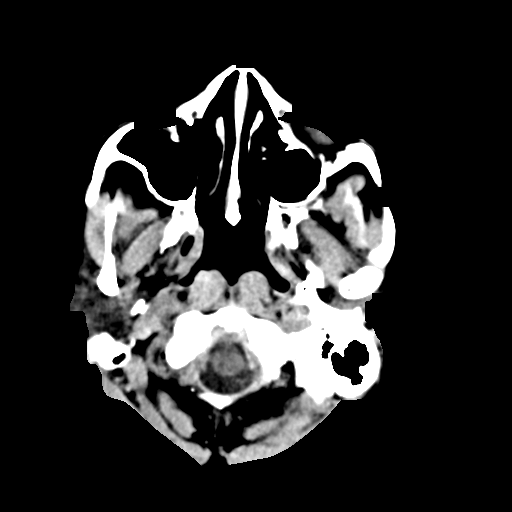
[im 6/34  brain]
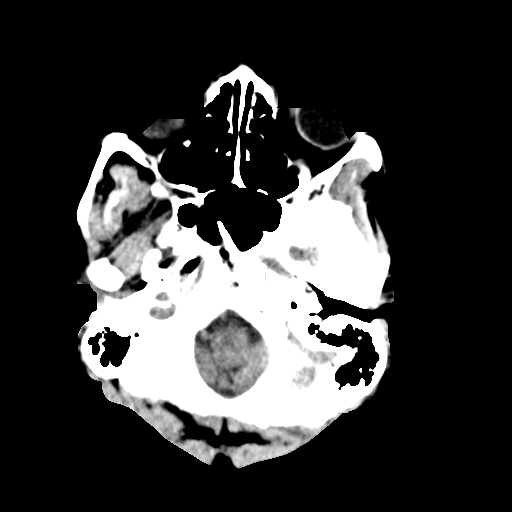
[im 8/34  brain]
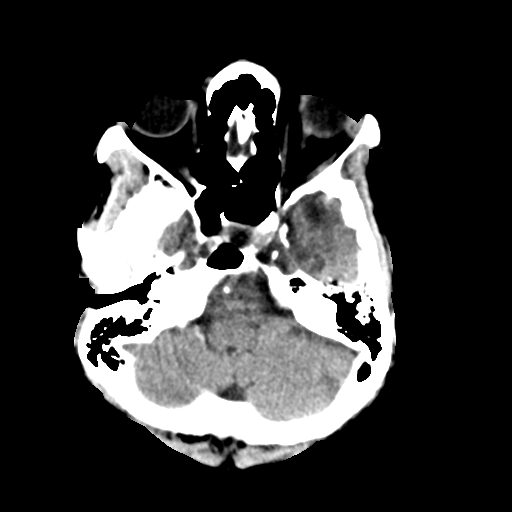
[im 10/34  brain]
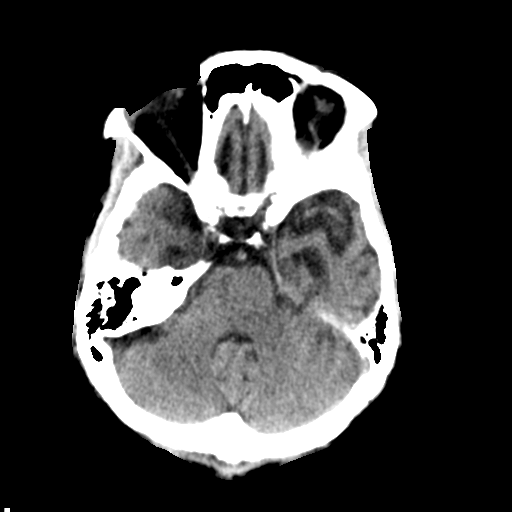
[im 10/34  bone]
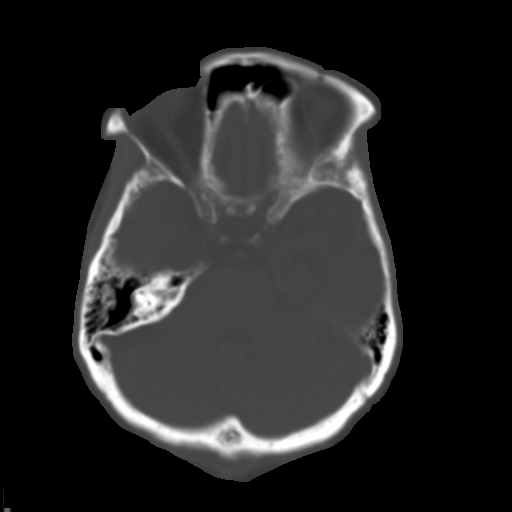
[im 12/34  brain]
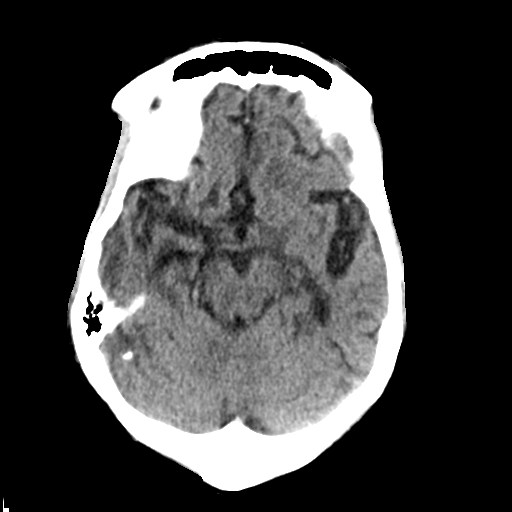
[im 14/34  brain]
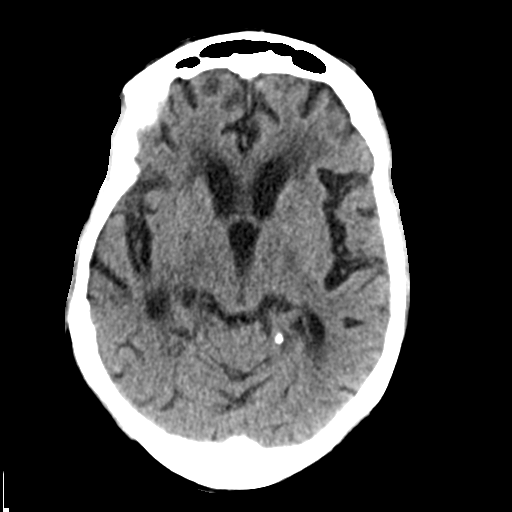
[im 16/34  brain]
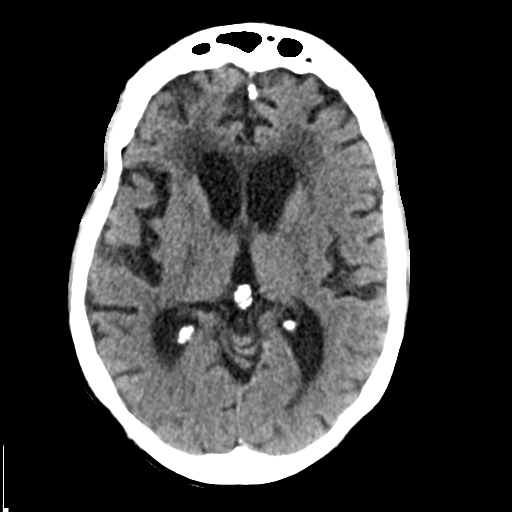
[im 18/34  brain]
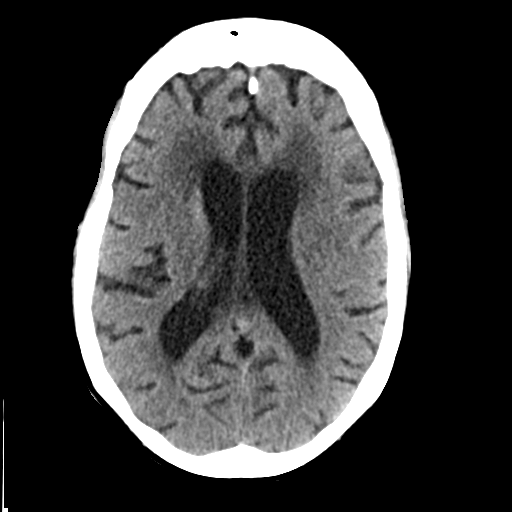
[im 18/34  bone]
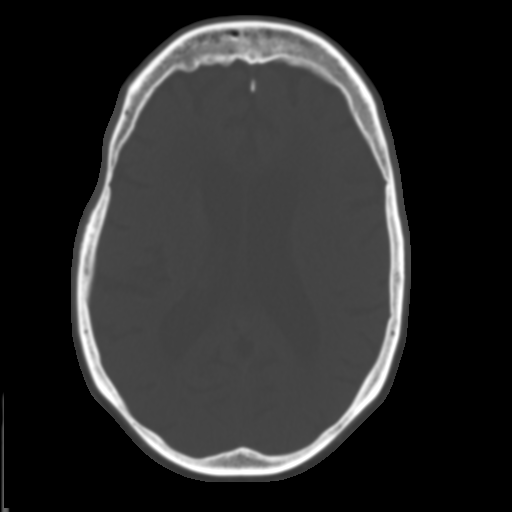
[im 20/34  brain]
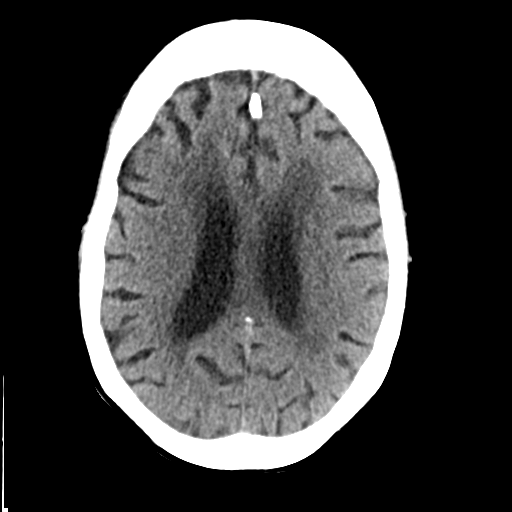
[im 22/34  brain]
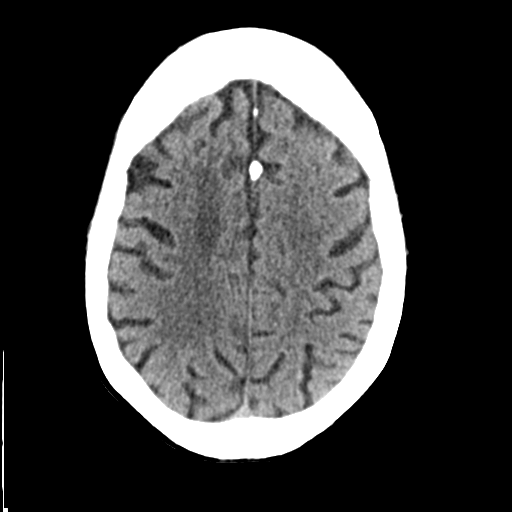
[im 24/34  brain]
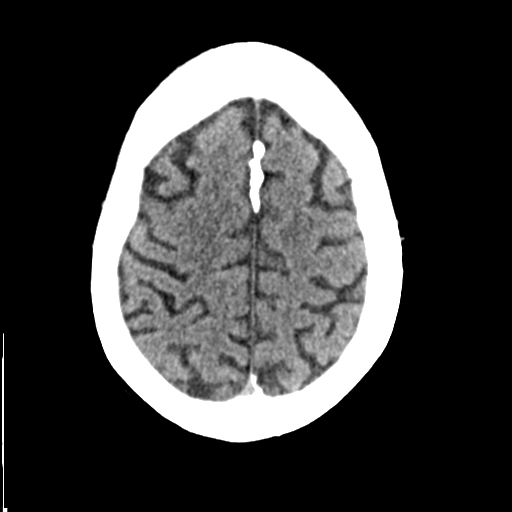
[im 26/34  brain]
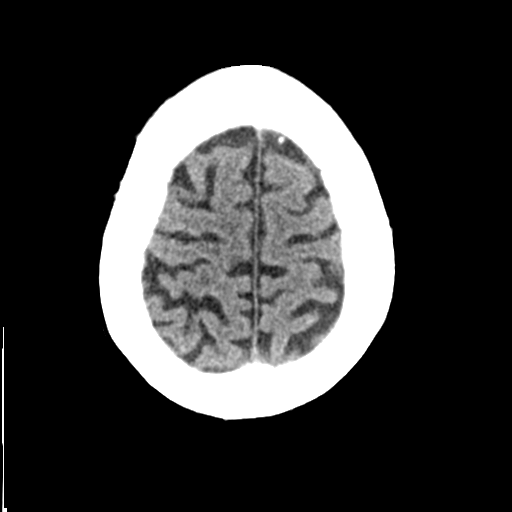
[im 26/34  bone]
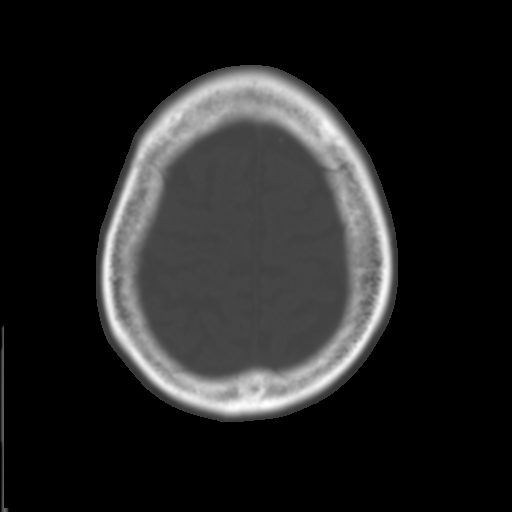
[im 28/34  brain]
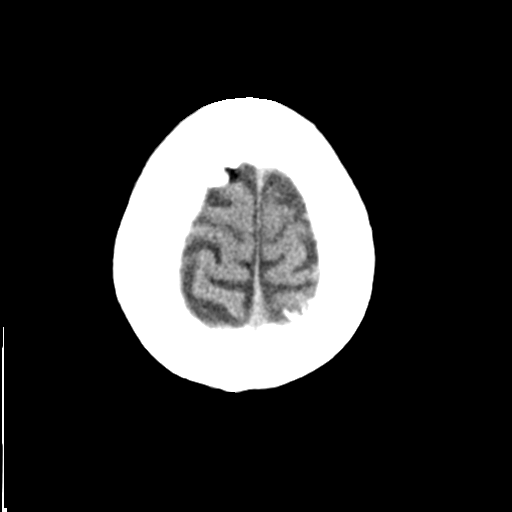
[im 30/34  brain]
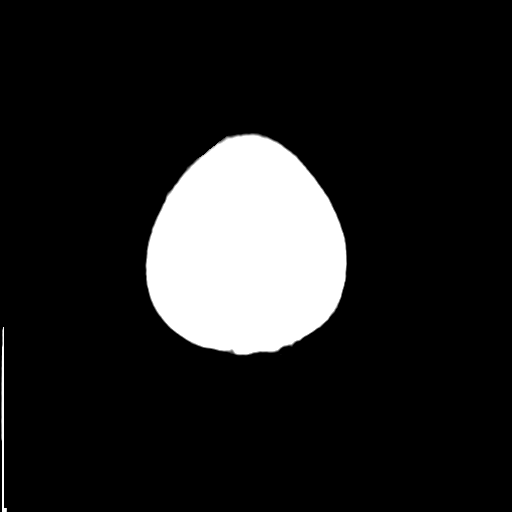
[im 32/34  brain]
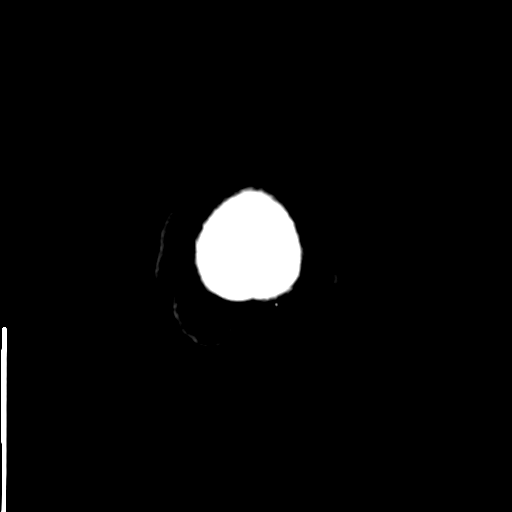

[16 of 30 positions shown; findings below may reference images not displayed]

FINDINGS: There is no evidence of mass effect, midline shift, or extra-axial
fluid collections. There is no evidence of a space-occupying lesion
or intracranial hemorrhage. There is no evidence of a cortical-based
area of acute infarction. There is generalized cerebral atrophy.
There is periventricular white matter low attenuation likely
secondary to microangiopathy.

The ventricles and sulci are appropriate for the patient's age. The
basal cisterns are patent.

Visualized portions of the orbits are unremarkable. The visualized
portions of the paranasal sinuses and mastoid air cells are
unremarkable. Cerebrovascular atherosclerotic calcifications are
noted.

The osseous structures are unremarkable.
IMPRESSION: No acute intracranial pathology.

## 2016-09-22 ENCOUNTER — Telehealth: Payer: Self-pay | Admitting: *Deleted

## 2016-09-22 NOTE — Telephone Encounter (Signed)
[facility] orders for Skilled Nursing to be continued for 3 times a week for wound care. Verbal orders given.

## 2016-10-02 ENCOUNTER — Ambulatory Visit: Payer: Medicare Other | Admitting: Internal Medicine

## 2016-10-03 ENCOUNTER — Other Ambulatory Visit: Payer: Self-pay | Admitting: *Deleted

## 2016-10-03 MED ORDER — OXYCODONE HCL 5 MG PO TABS: ORAL_TABLET | ORAL | 0 refills | Status: DC

## 2016-10-03 NOTE — Telephone Encounter (Signed)
Patient son, [name] requested and will pick up 

## 2016-10-06 ENCOUNTER — Encounter (HOSPITAL_BASED_OUTPATIENT_CLINIC_OR_DEPARTMENT_OTHER): Payer: Medicare Other | Attending: Internal Medicine

## 2016-10-11 DIAGNOSIS — M24562 Contracture, left knee: Secondary | ICD-10-CM | POA: Diagnosis not present

## 2016-10-11 DIAGNOSIS — Z466 Encounter for fitting and adjustment of urinary device: Secondary | ICD-10-CM | POA: Diagnosis not present

## 2016-10-11 DIAGNOSIS — M24561 Contracture, right knee: Secondary | ICD-10-CM | POA: Diagnosis not present

## 2016-10-11 DIAGNOSIS — F0280 Dementia in other diseases classified elsewhere without behavioral disturbance: Secondary | ICD-10-CM

## 2016-10-11 DIAGNOSIS — L89224 Pressure ulcer of left hip, stage 4: Secondary | ICD-10-CM

## 2016-10-11 DIAGNOSIS — G309 Alzheimer's disease, unspecified: Secondary | ICD-10-CM

## 2016-10-11 DIAGNOSIS — I10 Essential (primary) hypertension: Secondary | ICD-10-CM | POA: Diagnosis not present

## 2016-10-11 DIAGNOSIS — L89154 Pressure ulcer of sacral region, stage 4: Secondary | ICD-10-CM

## 2016-10-12 ENCOUNTER — Emergency Department (HOSPITAL_COMMUNITY): Payer: Medicare Other

## 2016-10-12 ENCOUNTER — Inpatient Hospital Stay (HOSPITAL_COMMUNITY)
Admission: EM | Admit: 2016-10-12 | Discharge: 2016-10-18 | DRG: 698 | Disposition: A | Discharge status: Hospice | Payer: Medicare Other | Attending: Internal Medicine | Admitting: Internal Medicine

## 2016-10-12 ENCOUNTER — Encounter (HOSPITAL_COMMUNITY): Payer: Self-pay

## 2016-10-12 DIAGNOSIS — Z681 Body mass index (BMI) 19 or less, adult: Secondary | ICD-10-CM

## 2016-10-12 DIAGNOSIS — R569 Unspecified convulsions: Secondary | ICD-10-CM

## 2016-10-12 DIAGNOSIS — N179 Acute kidney failure, unspecified: Secondary | ICD-10-CM

## 2016-10-12 DIAGNOSIS — S81802A Unspecified open wound, left lower leg, initial encounter: Secondary | ICD-10-CM

## 2016-10-12 DIAGNOSIS — N189 Chronic kidney disease, unspecified: Secondary | ICD-10-CM | POA: Diagnosis present

## 2016-10-12 DIAGNOSIS — R627 Adult failure to thrive: Secondary | ICD-10-CM | POA: Diagnosis present

## 2016-10-12 DIAGNOSIS — T83511A Infection and inflammatory reaction due to indwelling urethral catheter, initial encounter: Principal | ICD-10-CM | POA: Diagnosis present

## 2016-10-12 DIAGNOSIS — L89894 Pressure ulcer of other site, stage 4: Secondary | ICD-10-CM | POA: Diagnosis present

## 2016-10-12 DIAGNOSIS — E785 Hyperlipidemia, unspecified: Secondary | ICD-10-CM | POA: Diagnosis present

## 2016-10-12 DIAGNOSIS — R531 Weakness: Secondary | ICD-10-CM

## 2016-10-12 DIAGNOSIS — B9620 Unspecified Escherichia coli [E. coli] as the cause of diseases classified elsewhere: Secondary | ICD-10-CM | POA: Diagnosis present

## 2016-10-12 DIAGNOSIS — G9340 Encephalopathy, unspecified: Secondary | ICD-10-CM | POA: Diagnosis present

## 2016-10-12 DIAGNOSIS — Z66 Do not resuscitate: Secondary | ICD-10-CM | POA: Diagnosis not present

## 2016-10-12 DIAGNOSIS — N390 Urinary tract infection, site not specified: Secondary | ICD-10-CM

## 2016-10-12 DIAGNOSIS — G308 Other Alzheimer's disease: Secondary | ICD-10-CM | POA: Diagnosis not present

## 2016-10-12 DIAGNOSIS — Z515 Encounter for palliative care: Secondary | ICD-10-CM | POA: Diagnosis not present

## 2016-10-12 DIAGNOSIS — L89314 Pressure ulcer of right buttock, stage 4: Secondary | ICD-10-CM | POA: Diagnosis present

## 2016-10-12 DIAGNOSIS — Y846 Urinary catheterization as the cause of abnormal reaction of the patient, or of later complication, without mention of misadventure at the time of the procedure: Secondary | ICD-10-CM | POA: Diagnosis present

## 2016-10-12 DIAGNOSIS — Z7189 Other specified counseling: Secondary | ICD-10-CM | POA: Diagnosis not present

## 2016-10-12 DIAGNOSIS — G40909 Epilepsy, unspecified, not intractable, without status epilepticus: Secondary | ICD-10-CM | POA: Diagnosis present

## 2016-10-12 DIAGNOSIS — E870 Hyperosmolality and hypernatremia: Secondary | ICD-10-CM | POA: Diagnosis not present

## 2016-10-12 DIAGNOSIS — Z86718 Personal history of other venous thrombosis and embolism: Secondary | ICD-10-CM

## 2016-10-12 DIAGNOSIS — E876 Hypokalemia: Secondary | ICD-10-CM | POA: Diagnosis present

## 2016-10-12 DIAGNOSIS — I10 Essential (primary) hypertension: Secondary | ICD-10-CM | POA: Diagnosis present

## 2016-10-12 DIAGNOSIS — R54 Age-related physical debility: Secondary | ICD-10-CM | POA: Diagnosis present

## 2016-10-12 DIAGNOSIS — L89324 Pressure ulcer of left buttock, stage 4: Secondary | ICD-10-CM | POA: Diagnosis present

## 2016-10-12 DIAGNOSIS — G9341 Metabolic encephalopathy: Secondary | ICD-10-CM | POA: Diagnosis present

## 2016-10-12 DIAGNOSIS — E43 Unspecified severe protein-calorie malnutrition: Secondary | ICD-10-CM | POA: Diagnosis present

## 2016-10-12 DIAGNOSIS — F0280 Dementia in other diseases classified elsewhere without behavioral disturbance: Secondary | ICD-10-CM | POA: Diagnosis present

## 2016-10-12 DIAGNOSIS — F0281 Dementia in other diseases classified elsewhere with behavioral disturbance: Secondary | ICD-10-CM | POA: Diagnosis present

## 2016-10-12 DIAGNOSIS — E860 Dehydration: Secondary | ICD-10-CM | POA: Diagnosis not present

## 2016-10-12 DIAGNOSIS — L89154 Pressure ulcer of sacral region, stage 4: Secondary | ICD-10-CM | POA: Diagnosis present

## 2016-10-12 DIAGNOSIS — Z7401 Bed confinement status: Secondary | ICD-10-CM

## 2016-10-12 DIAGNOSIS — I701 Atherosclerosis of renal artery: Secondary | ICD-10-CM | POA: Diagnosis present

## 2016-10-12 DIAGNOSIS — G309 Alzheimer's disease, unspecified: Secondary | ICD-10-CM | POA: Diagnosis not present

## 2016-10-12 DIAGNOSIS — A4901 Methicillin susceptible Staphylococcus aureus infection, unspecified site: Secondary | ICD-10-CM | POA: Diagnosis not present

## 2016-10-12 DIAGNOSIS — Z86711 Personal history of pulmonary embolism: Secondary | ICD-10-CM

## 2016-10-12 DIAGNOSIS — I129 Hypertensive chronic kidney disease with stage 1 through stage 4 chronic kidney disease, or unspecified chronic kidney disease: Secondary | ICD-10-CM | POA: Diagnosis present

## 2016-10-12 DIAGNOSIS — Z888 Allergy status to other drugs, medicaments and biological substances status: Secondary | ICD-10-CM

## 2016-10-12 LAB — BASIC METABOLIC PANEL
BUN: 49 mg/dL — ABNORMAL HIGH (ref 6–20)
BUN: 51 mg/dL — ABNORMAL HIGH (ref 6–20)
CALCIUM: 9.4 mg/dL (ref 8.9–10.3)
CALCIUM: 9.2 mg/dL (ref 8.9–10.3)
CO2: 24 mmol/L (ref 22–32)
CO2: 23 mmol/L (ref 22–32)
CREATININE: 1.48 mg/dL — AB (ref 0.44–1.00)
Chloride: >130 mmol/L (ref 101–111)
Chloride: >130 mmol/L (ref 101–111)
Creatinine, Ser: 1.55 mg/dL — ABNORMAL HIGH (ref 0.44–1.00)
GFR calc non Af Amer: 33 mL/min — ABNORMAL LOW (ref >60)
GFR, EST AFRICAN AMERICAN: 38 mL/min — AB (ref >60)
GFR, EST AFRICAN AMERICAN: 36 mL/min — AB (ref >60)
GFR, EST NON AFRICAN AMERICAN: 31 mL/min — AB (ref >60)
Glucose, Bld: 154 mg/dL — ABNORMAL HIGH (ref 65–99)
Glucose, Bld: 145 mg/dL — ABNORMAL HIGH (ref 65–99)
POTASSIUM: 3.4 mmol/L — AB (ref 3.5–5.1)
Potassium: 3.4 mmol/L — ABNORMAL LOW (ref 3.5–5.1)
SODIUM: 170 mmol/L — AB (ref 135–145)
SODIUM: 169 mmol/L — AB (ref 135–145)

## 2016-10-12 LAB — COMPREHENSIVE METABOLIC PANEL
ALBUMIN: 2.6 g/dL — AB (ref 3.5–5.0)
ALK PHOS: 77 U/L (ref 38–126)
ALT: 49 U/L (ref 14–54)
AST: 24 U/L (ref 15–41)
BILIRUBIN TOTAL: 0.3 mg/dL (ref 0.3–1.2)
BUN: 52 mg/dL — AB (ref 6–20)
CALCIUM: 9.9 mg/dL (ref 8.9–10.3)
CO2: 23 mmol/L (ref 22–32)
Chloride: >130 mmol/L (ref 101–111)
Creatinine, Ser: 1.52 mg/dL — ABNORMAL HIGH (ref 0.44–1.00)
GFR calc Af Amer: 37 mL/min — ABNORMAL LOW (ref >60)
GFR calc non Af Amer: 32 mL/min — ABNORMAL LOW (ref >60)
GLUCOSE: 153 mg/dL — AB (ref 65–99)
Potassium: 3.6 mmol/L (ref 3.5–5.1)
Sodium: 171 mmol/L (ref 135–145)
TOTAL PROTEIN: 9.2 g/dL — AB (ref 6.5–8.1)

## 2016-10-12 LAB — MAGNESIUM: MAGNESIUM: 2.6 mg/dL — AB (ref 1.7–2.4)

## 2016-10-12 LAB — URINALYSIS, ROUTINE W REFLEX MICROSCOPIC
Bilirubin Urine: NEGATIVE
Glucose, UA: NEGATIVE mg/dL
Ketones, ur: NEGATIVE mg/dL
Nitrite: POSITIVE — AB
PH: 8 (ref 5.0–8.0)
Protein, ur: >=300 mg/dL — AB
Specific Gravity, Urine: 1.013 (ref 1.005–1.030)

## 2016-10-12 LAB — CBC WITH DIFFERENTIAL/PLATELET
BASOS ABS: 0 10*3/uL (ref 0.0–0.1)
Basophils Relative: 0 %
Eosinophils Absolute: 0 10*3/uL (ref 0.0–0.7)
Eosinophils Relative: 0 %
HEMATOCRIT: 34 % — AB (ref 36.0–46.0)
HEMOGLOBIN: 10.4 g/dL — AB (ref 12.0–15.0)
LYMPHS PCT: 26 %
Lymphs Abs: 3.9 10*3/uL (ref 0.7–4.0)
MCH: 28.1 pg (ref 26.0–34.0)
MCHC: 30.6 g/dL (ref 30.0–36.0)
MCV: 91.9 fL (ref 78.0–100.0)
MONOS PCT: 4 %
Monocytes Absolute: 0.6 10*3/uL (ref 0.1–1.0)
NEUTROS ABS: 10.4 10*3/uL — AB (ref 1.7–7.7)
Neutrophils Relative %: 70 %
Platelets: 366 10*3/uL (ref 150–400)
RBC: 3.7 MIL/uL — ABNORMAL LOW (ref 3.87–5.11)
RDW: 17.2 % — ABNORMAL HIGH (ref 11.5–15.5)
WBC: 14.9 10*3/uL — ABNORMAL HIGH (ref 4.0–10.5)

## 2016-10-12 LAB — PREALBUMIN: PREALBUMIN: 14 mg/dL — AB (ref 18–38)

## 2016-10-12 LAB — PHOSPHORUS: PHOSPHORUS: 3 mg/dL (ref 2.5–4.6)

## 2016-10-12 MED ORDER — OXYCODONE HCL 5 MG PO TABS: 2.5000 mg | ORAL_TABLET | Freq: Four times a day (QID) | ORAL | Status: DC | PRN

## 2016-10-12 MED ORDER — METOPROLOL TARTRATE 25 MG PO TABS: 25.0000 mg | ORAL_TABLET | Freq: Two times a day (BID) | ORAL | Status: DC

## 2016-10-12 MED ORDER — SODIUM CHLORIDE 0.9 % IV BOLUS (SEPSIS)
500.0000 mL | Freq: Once | INTRAVENOUS | Status: AC
  Administered 2016-10-12: 500 mL via INTRAVENOUS

## 2016-10-12 MED ORDER — CETYLPYRIDINIUM CHLORIDE 0.05 % MT LIQD
7.0000 mL | Freq: Two times a day (BID) | OROMUCOSAL | Status: DC
  Administered 2016-10-13 – 2016-10-18 (×8): 7 mL via OROMUCOSAL

## 2016-10-12 MED ORDER — ONDANSETRON HCL 4 MG/2ML IJ SOLN: 4.0000 mg | Freq: Four times a day (QID) | INTRAMUSCULAR | Status: DC | PRN

## 2016-10-12 MED ORDER — ONDANSETRON HCL 4 MG PO TABS: 4.0000 mg | ORAL_TABLET | Freq: Four times a day (QID) | ORAL | Status: DC | PRN

## 2016-10-12 MED ORDER — SODIUM CHLORIDE 0.45 % IV SOLN
INTRAVENOUS | Status: DC
  Administered 2016-10-12 – 2016-10-13 (×2): via INTRAVENOUS

## 2016-10-12 MED ORDER — LOSARTAN POTASSIUM 50 MG PO TABS: 25.0000 mg | ORAL_TABLET | Freq: Every day | ORAL | Status: DC

## 2016-10-12 MED ORDER — LEVETIRACETAM 100 MG/ML PO SOLN: 500.0000 mg | Freq: Two times a day (BID) | ORAL | Status: DC

## 2016-10-12 MED ORDER — PRO-STAT 64 PO LIQD
30.0000 mL | Freq: Three times a day (TID) | ORAL | Status: DC
  Filled 2016-10-12: qty 30

## 2016-10-12 MED ORDER — SODIUM CHLORIDE 0.9% FLUSH
3.0000 mL | Freq: Two times a day (BID) | INTRAVENOUS | Status: DC
  Administered 2016-10-13 – 2016-10-18 (×5): 3 mL via INTRAVENOUS

## 2016-10-12 MED ORDER — LEVETIRACETAM 500 MG/5ML IV SOLN
500.0000 mg | Freq: Two times a day (BID) | INTRAVENOUS | Status: DC
  Administered 2016-10-12 – 2016-10-18 (×12): 500 mg via INTRAVENOUS
  Filled 2016-10-12 (×13): qty 5

## 2016-10-12 MED ORDER — LORAZEPAM 2 MG/ML IJ SOLN: 2.0000 mg | Freq: Once | INTRAMUSCULAR | Status: DC | PRN

## 2016-10-12 MED ORDER — MEMANTINE HCL 10 MG PO TABS: 10.0000 mg | ORAL_TABLET | Freq: Two times a day (BID) | ORAL | Status: DC

## 2016-10-12 MED ORDER — DEXTROSE 5 % IV SOLN
1.0000 g | INTRAVENOUS | Status: DC
  Administered 2016-10-12 – 2016-10-14 (×3): 1 g via INTRAVENOUS
  Filled 2016-10-12 (×3): qty 10

## 2016-10-12 MED ORDER — ENSURE ENLIVE PO LIQD
237.0000 mL | Freq: Three times a day (TID) | ORAL | Status: DC
  Filled 2016-10-12 (×18): qty 237

## 2016-10-12 MED ORDER — SODIUM CHLORIDE 0.9 % IV BOLUS (SEPSIS): 1000.0000 mL | Freq: Once | INTRAVENOUS | Status: DC

## 2016-10-12 MED ORDER — ENOXAPARIN SODIUM 40 MG/0.4ML ~~LOC~~ SOLN
40.0000 mg | SUBCUTANEOUS | Status: DC
  Administered 2016-10-13: 40 mg via SUBCUTANEOUS
  Filled 2016-10-12: qty 0.4

## 2016-10-12 NOTE — H&P (Signed)
History and Physical    Sabrina Closssterline P Lamay ZOX:[id number] DOB: 11-16-1935 DOA: 10/12/2016  PCP: Bufford SpikesEED, [name], DO Patient coming from: Home  Chief Complaint: intermittent confusion  HPI: Sabrina Long is a 80 y.o. female with medical history significant of advanced Alzheimer's, bedbound, chronic left hip wound with wound VAC, regular metabolic derangements, hypertension, renal artery stenosis, seizures,.   Level V caveat, patient with advanced dementia and unable to provide any history. Patient will not even communicate by blinking or nodding her head. Patient stares blankly. History provided by EDP report. Attempted to contact patient's son but was unavailable.  Patient lives at home with her son. Patient is bedbound and minimally communicative due to advanced dementia. Left hip chronic wound with wound VAC placed. Per son patient seemed more lethargic this morning when he checked on her. Patient refused to eat or drink and would spit up water after sipping it from her drink. At times patient was seem to become more alert and make eye contact. Denies any recent fevers, chills, nausea, vomiting, diarrhea, change in urine output the patient does have a chronic Foley, diarrhea. Patient is seen at the wound clinic once a week for dressing changes and wound VAC check.   ED Course: Objective findings outlined below. Hypernatremia noted. Started on IV fluids.  Review of Systems: As per HPI otherwise 10 point review of systems negative.   Ambulatory Status: Bedbound  Past Medical History:  Diagnosis Date  . Abnormality of gait   . Alzheimer's disease   . Anxiety state   . Candidiasis of vulva and vagina   . Chest pain, unspecified 08/19/2008  . Colitis presumed to be due to infection 11/02/2011   enteritis, and gastroenteritis  . Constipation   . Cystitis   . Dementia with behavioral disturbance   . Depressive disorder   . Headache   . Hypercalcemia   . Hyperlipidemia   .  Hyperosmolality and/or hypernatremia   . Hypertension, essential   . Impetigo   . Liver disorder   . Memory loss 07/24/2007  . Osteoarthrosis   . Pneumonia 11/02/2011  . Reactive confusion   . Reflux esophagitis   . Renal artery stenosis (HCC)   . Seborrheic keratosis   . Seizures (HCC)   . Stiffness of joint   . Undiagnosed cardiac murmurs   . Weight loss     Past Surgical History:  Procedure Laterality Date  . CESAREAN SECTION      Social History   Social History  . Marital status: Widowed    Spouse name: N/A  . Number of children: N/A  . Years of education: N/A   Occupational History  . Not on file.   Social History Main Topics  . Smoking status: Never Smoker  . Smokeless tobacco: Never Used  . Alcohol use No  . Drug use: No  . Sexual activity: Not on file   Other Topics Concern  . Not on file   Social History Narrative  . No narrative on file    Allergies  Allergen Reactions  . Risperdal [Risperidone] Other (See Comments)    sedation    History reviewed. No pertinent family history.  Prior to Admission medications   Medication Sig Start Date End Date Taking? Authorizing Provider  alendronate (FOSAMAX) 70 MG tablet TAKE 1 TABLET ONCE A WEEK ON TUESDAYS WITH A FULL GLASS OF WATER ON AN EMPTY STOMACH 05/30/16   [name] L Reed, DO  AMBULATORY NON FORMULARY MEDICATION Suction Pump Device To  help with removing Antiseptic oral rinse 12/22/15   [name] L Reed, DO  Amino Acids-Protein Hydrolys (FEEDING SUPPLEMENT, PRO-STAT 64,) LIQD Take 30 mLs by mouth 3 (three) times daily with meals. 05/26/16   [name] L Reed, DO  antiseptic oral rinse (CPC / CETYLPYRIDINIUM CHLORIDE 0.05%) 0.05 % LIQD solution 7 mLs by Mouth Rinse route 2 (two) times daily. 12/12/15   Alison Murray, MD  ENSURE PLUS (ENSURE PLUS) LIQD Take 237 mLs by mouth 3 (three) times daily between meals.    Historical Provider, MD  levETIRAcetam (KEPPRA) 100 MG/ML solution Take 5 mLs (500 mg total) by mouth 2  (two) times daily. 07/03/16   [name] L Reed, DO  losartan (COZAAR) 25 MG tablet TAKE 1 TABLET DAILY FOR BLOOD PRESSURE 04/12/16   [name] L Reed, DO  memantine (NAMENDA) 10 MG tablet TAKE 1 TABLET TWICE A DAY FOR MEMORY LOSS 01/31/16   [name] L Reed, DO  metoprolol tartrate (LOPRESSOR) 25 MG tablet Take 25 mg by mouth 2 (two) times daily.    Historical Provider, MD  oxyCODONE (OXY IR/ROXICODONE) 5 MG immediate release tablet Take 1/2 tablet three times daily as needed for pain 10/03/16   Sharon Seller, NP    Physical Exam: Vitals:   10/12/16 1210 10/12/16 1413  BP: 133/81 126/78  Pulse: 105 108  Resp:  13  Temp:  99.4 F (37.4 C)  TempSrc:  Rectal  SpO2: 97% 100%     General: Appears somewhat anxious lying in bed.  Eyes:  PERRL, EOMI, normal lids, iris ENT: Very dry mucous membranes, poor dentition Neck:  no LAD, masses or thyromegaly Cardiovascular:  RRR, no m/r/g. No LE edema.  Respiratory:  CTA bilaterally, no w/r/r. Normal respiratory effort. Abdomen:  soft, ntnd, NABS Skin: L hip wound w/ wound vac in place.  no rash or induration seen on limited exam Musculoskeletal: Wasting away of muscles with contracture of lower extremities. No bony abnormalities. Psychiatric: Patient stares around the room but is noncommunicative and does not interact when prompted. Will not blink or close eyes on command. Neurologic: Moves upper extremities and coordinated fashion, moves all extremities to painful stimuli, no facial asymmetry, sensation intact.  Labs on Admission: I have personally reviewed following labs and imaging studies  CBC:  Recent Labs Lab 10/12/16 1300  WBC 14.9*  NEUTROABS 10.4*  HGB 10.4*  HCT 34.0*  MCV 91.9  PLT 366   Basic Metabolic Panel:  Recent Labs Lab 10/12/16 1300  NA 171*  K 3.6  CL >130*  CO2 23  GLUCOSE 153*  BUN 52*  CREATININE 1.52*  CALCIUM 9.9   GFR: CrCl cannot be calculated (Unknown ideal weight.). Liver Function  Tests:  Recent Labs Lab 10/12/16 1300  AST 24  ALT 49  ALKPHOS 77  BILITOT 0.3  PROT 9.2*  ALBUMIN 2.6*   No results for input(s): LIPASE, AMYLASE in the last 168 hours. No results for input(s): AMMONIA in the last 168 hours. Coagulation Profile: No results for input(s): INR, PROTIME in the last 168 hours. Cardiac Enzymes: No results for input(s): CKTOTAL, CKMB, CKMBINDEX, TROPONINI in the last 168 hours. BNP (last 3 results) No results for input(s): PROBNP in the last 8760 hours. HbA1C: No results for input(s): HGBA1C in the last 72 hours. CBG: No results for input(s): GLUCAP in the last 168 hours. Lipid Profile: No results for input(s): CHOL, HDL, LDLCALC, TRIG, CHOLHDL, LDLDIRECT in the last 72 hours. Thyroid Function Tests: No results for input(s): TSH,  T4TOTAL, FREET4, T3FREE, THYROIDAB in the last 72 hours. Anemia Panel: No results for input(s): VITAMINB12, FOLATE, FERRITIN, TIBC, IRON, RETICCTPCT in the last 72 hours. Urine analysis:    Component Value Date/Time   COLORURINE YELLOW 10/12/2016 1429   APPEARANCEUR TURBID (A) 10/12/2016 1429   LABSPEC 1.013 10/12/2016 1429   PHURINE 8.0 10/12/2016 1429   GLUCOSEU NEGATIVE 10/12/2016 1429   HGBUR SMALL (A) 10/12/2016 1429   BILIRUBINUR NEGATIVE 10/12/2016 1429   BILIRUBINUR neg 01/22/2014 1319   KETONESUR NEGATIVE 10/12/2016 1429   PROTEINUR >=300 (A) 10/12/2016 1429   UROBILINOGEN 0.2 07/22/2015 1903   NITRITE POSITIVE (A) 10/12/2016 1429   LEUKOCYTESUR MODERATE (A) 10/12/2016 1429    Creatinine Clearance: CrCl cannot be calculated (Unknown ideal weight.).  Sepsis Labs: @LABRCNTIP (procalcitonin:4,lacticidven:4) )No results found for this or any previous visit (from the past 240 hour(s)).   Radiological Exams on Admission: Dg Chest 1 View  Result Date: 10/12/2016 CLINICAL DATA:  Altered mental status and weakness EXAM: CHEST 1 VIEW COMPARISON:  02/02/2016 FINDINGS: Cardiac shadow is within normal limits.  Lungs are well aerated with mild chronic changes bilaterally. No focal infiltrate or sizable effusion is seen. No bony abnormality is seen. IMPRESSION: Chronic changes without acute abnormality. Electronically Signed   By: Alcide Clever M.D.   On: 10/12/2016 13:25   Ct Head Wo Contrast  Result Date: 10/12/2016 CLINICAL DATA:  Altered mental status this morning, now resolved. History of seizure in dementia. EXAM: CT HEAD WITHOUT CONTRAST TECHNIQUE: Contiguous axial images were obtained from the base of the skull through the vertex without intravenous contrast. COMPARISON:  02/16/2015 brain MRI and 02/15/2015 CT FINDINGS: Brain: There is no evidence of acute cortical infarct, intracranial hemorrhage, mass, midline shift, or extra-axial fluid collection. Advanced cerebral atrophy and extensive chronic small vessel white matter disease are similar to the prior studies. Vascular: Calcified atherosclerosis at the skullbase. No hyperdense vessel. Skull: No fracture or focal osseous lesion. Sinuses/Orbits: Visualized paranasal sinuses and mastoid air cells are clear. No acute findings in the imaged portions of the orbits. Other: None. IMPRESSION: 1. No evidence of acute intracranial abnormality. 2. Advanced cerebral atrophy and chronic small vessel ischemic disease. Electronically Signed   By: Sebastian Ache M.D.   On: 10/12/2016 14:10    EKG: Independently reviewed. Sinus, tachycardia, no ACS  Assessment/Plan Active Problems:   Acute encephalopathy   Alzheimer's disease   Essential hypertension   Protein-calorie malnutrition, severe (HCC)   Seizures (HCC)   Acute hypernatremia   Chronic UTI   AKI (acute kidney injury) (HCC)   Leg wound, left    Acute encephalopathy: Likely a combination of worsening of baseline advanced dementia, metabolic and infectious. Sodium 171, chloride>130, BUN 52, WBC 14.9 with left shift, UA grossly abnormal the patient has a chronic indwelling Foley catheter. At baseline  patient is noncommunicative.  - Treatment of metabolic derangements and UTI as below - Consider DC Namenda time of discharge from the hospitalist there is likely little to no benefit of this medicine at this patient's stage of cognitive decline.  Metabolic Derangements: Sodium 161, chloride 1:30, AK I all likely secondary to severe dehydration from poor fluid oral intake. Patient has had admissions for the same in the past. Will take multiple days to normalized NA as likely subacute or chronic.  - Half-normal saline - BMP every 4  UTI: Patient with chronic indwelling Foley. UA grossly abnormal. Unclear when Foley was last changed. As patient is unable to provide history, she is  acutely in subhepatic, she has a white count it is imperative to start antibiotics. Last urine culture grew pansensitive Escherichia coli - Ceftriaxone - Urine culture - Change chronic Foley - DC ABX if UCX unremarkable  AK I: Currently 1.5 to, BUN 52. Baseline 0.72 and 23 respectively. Secondary to dehydration. - IVF  Social: Per report patient is cared for by her son at home. There is concern for caregiver overburdened given patient's apparent condition of dehydration and chronic left hip wound and foul body odor and urine stained smell. Additional concern for patient's quality of life. - Palliative care consult for goals of care and possible hospice (patient's son is the primary caregiver, Rande LawmanBrian Choe at [phone].)  Severe protein calorie malnutrition and physical deconditioning: Bedbound. Patient cachectic appearing anticipate dehydration and malnutrition likely secondary to advanced dementia and oral aversion. - PT/OT/nutrition/pre-albumin - Continue home feeding supplement  Seizure disorder: No recent reported seizures - Continue Keppra - Ativan PRN  Hypertension: Normotensive at time of admission - Continue metoprolol, losartan in the a.m.    DVT prophylaxis:  Lovenox  Code Status: FULL  Family  Communication: non- attempted to reach son [name] as above  Disposition Plan:  Pending improvement and palliative care consult for GOC  Consults called: Palliative  Admission status: inpt    Latitia Housewright J MD [facility]  If 7PM-7AM, please contact night-coverage [email or web address] Password [email or web address]  10/12/2016, 4:43 PM

## 2016-10-12 NOTE — ED Notes (Signed)
[name] son's phone number [phone]

## 2016-10-12 NOTE — ED Provider Notes (Signed)
[facility]-EMERGENCY DEPT Provider Note   CSN: [id number] Arrival date & time: 10/12/16  1208     History   Chief Complaint Chief Complaint  Patient presents with  . Failure To Thrive    HPI Sabrina Long is a 80 y.o. female.  HPI 80 year old female who presents with intermittent unresponsiveness. She has a history of end-stage Alzheimer's disease, and at baseline is nonambulatory and nonverbal. She also has history of seizure disorder on Keppra, hypertension, and hyperlipidemia. She lives with her son at home who is her primary caregiver. States that she has been in her usual state of health. This morning states that she seem altered and lethargic. States that normally she is able to note that he is present and makes eye contact, but she was not behaving normally. States that this lasted for about 4 hours, and refused to eat or drink anything, coughing up water that he placed in her mouth. He subsequently called EMS, but on their arrival, she was back to her baseline mental status. No fevers, chills, nausea or vomiting, diarrhea, abnormal urine output, cough, or shortness of breath. She has chronic indwelling Foley without changes to the urine or Foley. She has chronic left hip wound with wound vacuum and recently seen by wound clinic this week and there were no concerns about her wound. Past Medical History:  Diagnosis Date  . Abnormality of gait   . Alzheimer's disease   . Anxiety state   . Candidiasis of vulva and vagina   . Chest pain, unspecified 08/19/2008  . Colitis presumed to be due to infection 11/02/2011   enteritis, and gastroenteritis  . Constipation   . Cystitis   . Dementia with behavioral disturbance   . Depressive disorder   . Headache   . Hypercalcemia   . Hyperlipidemia   . Hyperosmolality and/or hypernatremia   . Hypertension, essential   . Impetigo   . Liver disorder   . Memory loss 07/24/2007  . Osteoarthrosis   . Pneumonia 11/02/2011  . Reactive  confusion   . Reflux esophagitis   . Renal artery stenosis (HCC)   . Seborrheic keratosis   . Seizures (HCC)   . Stiffness of joint   . Undiagnosed cardiac murmurs   . Weight loss     Patient Active Problem List   Diagnosis Date Noted  . Acute hypernatremia 10/12/2016  . Foley catheter in place 07/03/2016  . Pressure ulcer 12/06/2015  . Lobar pneumonia, unspecified organism (HCC) 12/05/2015  . Sepsis due to pneumonia (HCC) 12/05/2015  . Leukocytosis 12/05/2015  . Hyperkalemia 12/05/2015  . Seizures (HCC) 12/05/2015  . Dyslipidemia 12/05/2015  . Acute kidney injury (HCC) 12/05/2015  . Protein-calorie malnutrition, severe (HCC) 02/16/2015  . Left leg DVT (HCC) 08/17/2014  . Essential hypertension 07/29/2014  . Bilateral pulmonary embolism (HCC) 07/27/2014  . Alzheimer's disease 04/02/2014  . Acute encephalopathy 02/17/2014  . Dementia with behavioral disturbance     Past Surgical History:  Procedure Laterality Date  . CESAREAN SECTION      OB History    No data available       Home Medications    Prior to Admission medications   Medication Sig Start Date End Date Taking? Authorizing Provider  alendronate (FOSAMAX) 70 MG tablet TAKE 1 TABLET ONCE A WEEK ON TUESDAYS WITH A FULL GLASS OF WATER ON AN EMPTY STOMACH 05/30/16   [name] L Reed, DO  AMBULATORY NON FORMULARY MEDICATION Suction Pump Device To help with removing Antiseptic oral  rinse 12/22/15   [name] L Reed, DO  Amino Acids-Protein Hydrolys (FEEDING SUPPLEMENT, PRO-STAT 64,) LIQD Take 30 mLs by mouth 3 (three) times daily with meals. 05/26/16   [name] L Reed, DO  antiseptic oral rinse (CPC / CETYLPYRIDINIUM CHLORIDE 0.05%) 0.05 % LIQD solution 7 mLs by Mouth Rinse route 2 (two) times daily. 12/12/15   Alison Murray, MD  ENSURE PLUS (ENSURE PLUS) LIQD Take 237 mLs by mouth 3 (three) times daily between meals.    Historical Provider, MD  levETIRAcetam (KEPPRA) 100 MG/ML solution Take 5 mLs (500 mg total) by mouth 2  (two) times daily. 07/03/16   [name] L Reed, DO  losartan (COZAAR) 25 MG tablet TAKE 1 TABLET DAILY FOR BLOOD PRESSURE 04/12/16   [name] L Reed, DO  memantine (NAMENDA) 10 MG tablet TAKE 1 TABLET TWICE A DAY FOR MEMORY LOSS 01/31/16   [name] L Reed, DO  metoprolol tartrate (LOPRESSOR) 25 MG tablet Take 25 mg by mouth 2 (two) times daily.    Historical Provider, MD  oxyCODONE (OXY IR/ROXICODONE) 5 MG immediate release tablet Take 1/2 tablet three times daily as needed for pain 10/03/16   Sharon Seller, NP    Family History History reviewed. No pertinent family history.  Social History Social History  Substance Use Topics  . Smoking status: Never Smoker  . Smokeless tobacco: Never Used  . Alcohol use No     Allergies   Risperdal [risperidone]   Review of Systems Review of Systems 10/14 systems reviewed and are negative other than those stated in the HPI   Physical Exam Updated Vital Signs BP 126/78 (BP Location: Right Arm)   Pulse 108   Temp 99.4 F (37.4 C) (Rectal)   Resp 13   SpO2 100%   Physical Exam Physical Exam  Nursing note and vitals reviewed. Constitutional:elderly woman,  non-toxic, and in no acute distress Head: Normocephalic and atraumatic.  Mouth/Throat: Oropharynx is clear and dry.  Neck: Normal range of motion. Neck supple.  Cardiovascular: Tachycardic rate and regular rhythm.   Pulmonary/Chest: Effort normal and breath sounds normal.  Abdominal: Soft. There is no tenderness. There is no rebound and no guarding.  Musculoskeletal: wound vac over left hip wound in place Neurological: Alert, no facial droop, makes eye contact, does not obey commands Skin: Skin is warm and dry.  Psychiatric: Cooperative   ED Treatments / Results  Labs (all labs ordered are listed, but only abnormal results are displayed) Labs Reviewed  CBC WITH DIFFERENTIAL/PLATELET - Abnormal; Notable for the following:       Result Value   WBC 14.9 (*)    RBC 3.70 (*)     Hemoglobin 10.4 (*)    HCT 34.0 (*)    RDW 17.2 (*)    Neutro Abs 10.4 (*)    All other components within normal limits  COMPREHENSIVE METABOLIC PANEL - Abnormal; Notable for the following:    Sodium 171 (*)    Chloride >130 (*)    Glucose, Bld 153 (*)    BUN 52 (*)    Creatinine, Ser 1.52 (*)    Total Protein 9.2 (*)    Albumin 2.6 (*)    GFR calc non Af Amer 32 (*)    GFR calc Af Amer 37 (*)    All other components within normal limits  URINALYSIS, ROUTINE W REFLEX MICROSCOPIC - Abnormal; Notable for the following:    APPearance TURBID (*)    Hgb urine dipstick SMALL (*)  Protein, ur >=300 (*)    Nitrite POSITIVE (*)    Leukocytes, UA MODERATE (*)    Bacteria, UA MANY (*)    Squamous Epithelial / LPF 0-5 (*)    All other components within normal limits  URINE CULTURE  CBG MONITORING, ED    EKG  EKG Interpretation  Date/Time:  Thursday October 12 2016 12:15:18 EST Ventricular Rate:  100 PR Interval:    QRS Duration: 86 QT Interval:  342 QTC Calculation: 442 R Axis:   61 Text Interpretation:  Sinus tachycardia LVH with secondary repolarization abnormality other than tachycardia, no other significant changes  Confirmed by LIU MD, DANA [id number]) on 10/12/2016 12:20:57 PM       Radiology Dg Chest 1 View  Result Date: 10/12/2016 CLINICAL DATA:  Altered mental status and weakness EXAM: CHEST 1 VIEW COMPARISON:  02/02/2016 FINDINGS: Cardiac shadow is within normal limits. Lungs are well aerated with mild chronic changes bilaterally. No focal infiltrate or sizable effusion is seen. No bony abnormality is seen. IMPRESSION: Chronic changes without acute abnormality. Electronically Signed   By: Alcide CleverMark  Lukens M.D.   On: 10/12/2016 13:25   Ct Head Wo Contrast  Result Date: 10/12/2016 CLINICAL DATA:  Altered mental status this morning, now resolved. History of seizure in dementia. EXAM: CT HEAD WITHOUT CONTRAST TECHNIQUE: Contiguous axial images were obtained from the base of  the skull through the vertex without intravenous contrast. COMPARISON:  02/16/2015 brain MRI and 02/15/2015 CT FINDINGS: Brain: There is no evidence of acute cortical infarct, intracranial hemorrhage, mass, midline shift, or extra-axial fluid collection. Advanced cerebral atrophy and extensive chronic small vessel white matter disease are similar to the prior studies. Vascular: Calcified atherosclerosis at the skullbase. No hyperdense vessel. Skull: No fracture or focal osseous lesion. Sinuses/Orbits: Visualized paranasal sinuses and mastoid air cells are clear. No acute findings in the imaged portions of the orbits. Other: None. IMPRESSION: 1. No evidence of acute intracranial abnormality. 2. Advanced cerebral atrophy and chronic small vessel ischemic disease. Electronically Signed   By: Sebastian AcheAllen  Grady M.D.   On: 10/12/2016 14:10    Procedures Procedures (including critical care time) CRITICAL CARE Performed by: Lavera Guiseana Duo Liu   Total critical care time: 35 minutes  Critical care time was exclusive of separately billable procedures and treating other patients.  Critical care was necessary to treat or prevent imminent or life-threatening deterioration.  Critical care was time spent personally by me on the following activities: development of treatment plan with patient and/or surrogate as well as nursing, discussions with consultants, evaluation of patient's response to treatment, examination of patient, obtaining history from patient or surrogate, ordering and performing treatments and interventions, ordering and review of laboratory studies, ordering and review of radiographic studies, pulse oximetry and re-evaluation of patient's condition.  Medications Ordered in ED Medications  0.45 % sodium chloride infusion ( Intravenous New Bag/Given 10/12/16 1532)  sodium chloride 0.9 % bolus 500 mL (0 mLs Intravenous Stopped 10/12/16 1531)     Initial Impression / Assessment and Plan / ED Course  I  have reviewed the triage vital signs and the nursing notes.  Pertinent labs & imaging results that were available during my care of the patient were reviewed by me and considered in my medical decision making (see chart for details).  Clinical Course     Presenting with 4 hours of altered mental status, now resolved in the ED. Family and caregivers states she is at her baseline mental status. Does look dry  on exam, and with blood work suggestive of dehydration with acute kidney injury and hypernatremia of 170. She has free water deficit of 5.2 L, and this seems acute given that she has not had much fluids to drink over the past 24-48 hour period. She was given 500 milliliters of normal saline, and subsequently started on half-normal saline at 100. Infectious work-up pursued with UA pending. Chest x-ray without evidence of pneumonia or other acute cardiopulmonary processes on visualization. CT had also visualized and shows no acute intracranial processes. I discussed with Dr. Konrad Dolores from hospitalist service will admit for ongoing management of hypernatremia and dehydration.  Fi nal Clinical Impressions(s) / ED Diagnoses   Final diagnoses:  Dehydration  AKI (acute kidney injury) (HCC)  Acute hypernatremia    New Prescriptions New Prescriptions   No medications on file     Lavera Guise, MD 10/12/16 [phone]

## 2016-10-12 NOTE — ED Notes (Signed)
Critical values lab called.  Sodium 171 and Chloride >130 was reported to Dr. Verdie MosherLiu at this time

## 2016-10-12 NOTE — ED Notes (Signed)
Attempted report x1. 

## 2016-10-12 NOTE — ED Triage Notes (Signed)
[facility]- pt here from home with c/o failure to thrive. Pt has been unable to swallow her food this morning and has been more lethargic than usual. Pt has DNR at bedside. She is at baseline on arrival, usually non verbal.

## 2016-10-13 DIAGNOSIS — I10 Essential (primary) hypertension: Secondary | ICD-10-CM

## 2016-10-13 DIAGNOSIS — Z7189 Other specified counseling: Secondary | ICD-10-CM

## 2016-10-13 DIAGNOSIS — Z515 Encounter for palliative care: Secondary | ICD-10-CM

## 2016-10-13 LAB — BASIC METABOLIC PANEL
BUN: 53 mg/dL — ABNORMAL HIGH (ref 6–20)
BUN: 49 mg/dL — ABNORMAL HIGH (ref 6–20)
BUN: 54 mg/dL — AB (ref 6–20)
BUN: 48 mg/dL — ABNORMAL HIGH (ref 6–20)
CALCIUM: 8.7 mg/dL — AB (ref 8.9–10.3)
CALCIUM: 9 mg/dL (ref 8.9–10.3)
CO2: 22 mmol/L (ref 22–32)
CO2: 22 mmol/L (ref 22–32)
CO2: 21 mmol/L — AB (ref 22–32)
CO2: 20 mmol/L — ABNORMAL LOW (ref 22–32)
Calcium: 9 mg/dL (ref 8.9–10.3)
Calcium: 8.7 mg/dL — ABNORMAL LOW (ref 8.9–10.3)
Chloride: >130 mmol/L (ref 101–111)
Chloride: >130 mmol/L (ref 101–111)
Chloride: >130 mmol/L (ref 101–111)
Chloride: >130 mmol/L (ref 101–111)
Creatinine, Ser: 1.54 mg/dL — ABNORMAL HIGH (ref 0.44–1.00)
Creatinine, Ser: 1.73 mg/dL — ABNORMAL HIGH (ref 0.44–1.00)
Creatinine, Ser: 1.69 mg/dL — ABNORMAL HIGH (ref 0.44–1.00)
Creatinine, Ser: 1.85 mg/dL — ABNORMAL HIGH (ref 0.44–1.00)
GFR, EST AFRICAN AMERICAN: 29 mL/min — AB (ref >60)
GFR, EST AFRICAN AMERICAN: 32 mL/min — AB (ref >60)
GFR, EST AFRICAN AMERICAN: 31 mL/min — AB (ref >60)
GFR, EST AFRICAN AMERICAN: 36 mL/min — AB (ref >60)
GFR, EST NON AFRICAN AMERICAN: 25 mL/min — AB (ref >60)
GFR, EST NON AFRICAN AMERICAN: 28 mL/min — AB (ref >60)
GFR, EST NON AFRICAN AMERICAN: 27 mL/min — AB (ref >60)
GFR, EST NON AFRICAN AMERICAN: 31 mL/min — AB (ref >60)
GLUCOSE: 174 mg/dL — AB (ref 65–99)
Glucose, Bld: 153 mg/dL — ABNORMAL HIGH (ref 65–99)
Glucose, Bld: 152 mg/dL — ABNORMAL HIGH (ref 65–99)
Glucose, Bld: 164 mg/dL — ABNORMAL HIGH (ref 65–99)
POTASSIUM: 3.4 mmol/L — AB (ref 3.5–5.1)
POTASSIUM: 3.1 mmol/L — AB (ref 3.5–5.1)
Potassium: 3.3 mmol/L — ABNORMAL LOW (ref 3.5–5.1)
Potassium: 3.2 mmol/L — ABNORMAL LOW (ref 3.5–5.1)
SODIUM: 168 mmol/L — AB (ref 135–145)
SODIUM: 168 mmol/L — AB (ref 135–145)
SODIUM: 169 mmol/L — AB (ref 135–145)
Sodium: 171 mmol/L (ref 135–145)

## 2016-10-13 LAB — CBC
HEMATOCRIT: 30 % — AB (ref 36.0–46.0)
HEMOGLOBIN: 8.8 g/dL — AB (ref 12.0–15.0)
MCH: 27.3 pg (ref 26.0–34.0)
MCHC: 29.3 g/dL — ABNORMAL LOW (ref 30.0–36.0)
MCV: 93.2 fL (ref 78.0–100.0)
Platelets: 345 10*3/uL (ref 150–400)
RBC: 3.22 MIL/uL — ABNORMAL LOW (ref 3.87–5.11)
RDW: 17.4 % — AB (ref 11.5–15.5)
WBC: 15.4 10*3/uL — AB (ref 4.0–10.5)

## 2016-10-13 MED ORDER — DEXTROSE 5 % IV SOLN
INTRAVENOUS | Status: DC
  Administered 2016-10-13 – 2016-10-14 (×2): via INTRAVENOUS

## 2016-10-13 MED ORDER — MORPHINE SULFATE 10 MG/5ML PO SOLN: 5.0000 mg | ORAL | 0 refills | Status: AC | PRN

## 2016-10-13 MED ORDER — LORAZEPAM 2 MG/ML PO CONC: 1.0000 mg | ORAL | 0 refills | Status: AC | PRN

## 2016-10-13 NOTE — Evaluation (Signed)
Physical Therapy Evaluation Patient Details Name: Consuello Closssterline P Coon MRN: [id number] DOB: 18-Apr-1936 Today's Date: 10/13/2016   History of Present Illness  80 y.o. female with medical history significant of advanced Alzheimer's, bedbound, chronic left hip wound with wound VAC, regular metabolic derangements, hypertension, renal artery stenosis, seizures. Pt presenting with increased lethargy.  Clinical Impression  Patient demonstrates no ability to engage with therapists at this time. Do not feel this patient is appropriate for follow up care. Will defer back to home with total assist. Acute PT to sign off.    Follow Up Recommendations No PT follow up (return home with total care)    Equipment Recommendations       Recommendations for Other Services       Precautions / Restrictions Precautions Precautions: Fall Restrictions Weight Bearing Restrictions: No      Mobility  Bed Mobility Overal bed mobility: Needs Assistance Bed Mobility: Rolling Rolling: Total assist;+2 for physical assistance         General bed mobility comments: Total assist for rolling to reposition in bed.  Transfers                    Ambulation/Gait                Stairs            Wheelchair Mobility    Modified Rankin (Stroke Patients Only)       Balance                                             Pertinent Vitals/Pain Pain Assessment: Faces Faces Pain Scale: Hurts little more Pain Location: grimacing with movement Pain Descriptors / Indicators: Grimacing Pain Intervention(s): Repositioned    Home Living Family/patient expects to be discharged to:: Private residence Living Arrangements: Children (son)               Additional Comments: Per chart review, pt from home with son.    Prior Function Level of Independence: Needs assistance   Gait / Transfers Assistance Needed: per chart review, pt bedbound at baseline  ADL's /  Homemaking Assistance Needed: dependent with ADL        Hand Dominance        Extremity/Trunk Assessment   Upper Extremity Assessment Upper Extremity Assessment: RUE deficits/detail;LUE deficits/detail RUE Deficits / Details: No active ROM noted. Limited PROM due to contractures. LUE Deficits / Details: No active ROM noted. Limited PROM due to contractures.    Lower Extremity Assessment Lower Extremity Assessment: Generalized weakness;RLE deficits/detail;LLE deficits/detail RLE Deficits / Details: contractures noted in bilateral LEs, minimal muscle movement  LLE Deficits / Details: contractures noted in bilateral LEs, minimal muscle movement        Communication   Communication: Other (comment) (non communicative at baseline)  Cognition Arousal/Alertness: Awake/alert Behavior During Therapy: Flat affect Overall Cognitive Status: History of cognitive impairments - at baseline                      General Comments      Exercises     Assessment/Plan    PT Assessment Patent does not need any further PT services  PT Problem List            PT Treatment Interventions      PT Goals (Current goals can be found in the  Care Plan section)  Acute Rehab PT Goals PT Goal Formulation: Patient unable to participate in goal setting    Frequency     Barriers to discharge        Co-evaluation               End of Session Equipment Utilized During Treatment: Oxygen   Patient left: in bed;with call bell/phone within reach;with bed alarm set Nurse Communication: Mobility status (no further acute PT, not appropriate)         Time: 1209-1226 PT Time Calculation (min) (ACUTE ONLY): 17 min   Charges:   PT Evaluation $PT Eval Low Complexity: 1 Procedure     PT G Codes:        Fabio AsaDevon J Breionna Punt 10/13/2016, 4:07 PM  Charlotte Crumbevon Dearia Wilmouth, PT DPT  [phone]

## 2016-10-13 NOTE — Consult Note (Signed)
Consultation Note Date: 10/13/2016   Patient Name: Sabrina Long  DOB: [birth year]  MRN: [id number]  Age / Sex: 80 y.o., female  PCP: Gayland Curry, DO Referring Physician: Geradine Girt, DO  Reason for Consultation: Establishing goals of care  HPI/Patient Profile: 81 y.o. female  with past medical history of advanced dementia, stage 4 wounds, seizures, hypernatremia, who was admitted on 10/12/2016 with lethargy and severe electrolyte abnormalities (NA 170, CL >130), acute on chronic renal failure and UTI.  At baseline she is bedbound and barely verbal.  She is cared for in her son's home with round the clock CNAs.  She has long term stage 4 wounds.  She is thin and frail.  Her albumin is 2.6.  Clinical Assessment and Goals of Care: I met with the patient and her HCPOA son Sabrina Long).  The patient was non-verbal.  Sabrina Long told me his mother had 9 children.  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  She was married to a Publishing copy and would fly ahead of him to each new homestead to set up house before he arrived - they lived all over the world.  She was extraordinarily intelligent and ahead of her time.  4 years ago Sabrina Long brought his mother to [facility] from a SNF in [place].  He placed her in skilled nursing here, but was never pleased with the care.  He and his brothers built a place for her (and his father) in his basement.  She has custom equipment and 24 hour care from ladies he hired thru [employer].  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  It seems Sabrina Long has created his own private hospice in his basement for his parents.  The patient's husband of 50 years passed in August of 2017.  Sabrina Long accepts that his mother is near end of life as well.  He believed she was going to pass when he brought her to the hospital.  We talked about end stage dementia.  He understands she is clearly  there.  He and his sisters debated whether or not to bring her back to the hospital this time.  More than anything he wants her passing to be peaceful.   He has considered hospice in the past but tells me he has a very good set up in place and does not want to risk making any changes.  We talked about dwindling down to a peaceful death and that dehydration was part of the dying process.  We discussed using comfort medications such as morphine and ativan when the time comes.  Sabrina Long and his siblings have made the firm decision that the patient is a DNR.  They would never want her to undergo a code.  Primary Decision Maker:  HCPOA son Sabrina Long    SUMMARY OF RECOMMENDATIONS     DNR  D/C to home when medically ready.  Prescriptions for comfort medications have been written and are on the shadow chart.  Family  is not accepting of the Hospice benefit at this time although the patient is clearly qualified.  Sabrina Long knows to contact PMT when he wants to avail himself of their services.  Code Status/Advance Care Planning:  DNR    Symptom Management:   Per primary team  Psycho-social/Spiritual:   Desire for further Chaplaincy support:yes, Baptist  Additional Recommendations: Education on Hospice  Prognosis:   < 6 months  Discharge Planning: Offer Hospice, Palliative, Home Health, but most likely patient will go home in her son's care.      Primary Diagnoses: Present on Admission: . Acute hypernatremia . Acute encephalopathy . Alzheimer's disease . Essential hypertension . Protein-calorie malnutrition, severe ([place])   I have reviewed the medical record, interviewed the patient and family, and examined the patient. The following aspects are pertinent.  Past Medical History:  Diagnosis Date  . Abnormality of gait   . Alzheimer's disease   . Anxiety state   . Candidiasis of vulva and vagina   . Chest pain, unspecified 08/19/2008  . Colitis presumed to be due to infection  11/02/2011   enteritis, and gastroenteritis  . Constipation   . Cystitis   . Dementia with behavioral disturbance   . Depressive disorder   . Headache   . Hypercalcemia   . Hyperlipidemia   . Hyperosmolality and/or hypernatremia   . Hypertension, essential   . Impetigo   . Liver disorder   . Memory loss 07/24/2007  . Osteoarthrosis   . Pneumonia 11/02/2011  . Reactive confusion   . Reflux esophagitis   . Renal artery stenosis ([place])   . Seborrheic keratosis   . Seizures ([place])   . Stiffness of joint   . Undiagnosed cardiac murmurs   . Weight loss    Social History   Social History  . Marital status: Widowed    Spouse name: N/A  . Number of children: N/A  . Years of education: N/A   Social History Main Topics  . Smoking status: Never Smoker  . Smokeless tobacco: Never Used  . Alcohol use No  . Drug use: No  . Sexual activity: Not Asked   Other Topics Concern  . None   Social History Narrative  . None   History reviewed. No pertinent family history. Scheduled Meds: . antiseptic oral rinse  7 mL Mouth Rinse BID  . cefTRIAXone (ROCEPHIN)  IV  1 g Intravenous Q24H  . enoxaparin (LOVENOX) injection  40 mg Subcutaneous Q24H  . feeding supplement (ENSURE ENLIVE)  237 mL Oral TID BM  . feeding supplement (PRO-STAT 64)  30 mL Oral TID WC  . levETIRAcetam  500 mg Intravenous Q12H  . sodium chloride flush  3 mL Intravenous Q12H   Continuous Infusions: . dextrose 75 mL/hr at 10/13/16 0957   PRN Meds:.LORazepam, ondansetron **OR** ondansetron (ZOFRAN) IV Allergies  Allergen Reactions  . Risperdal [Risperidone] Other (See Comments)    sedation   Review of Systems demented.  Unable to speak  Physical Exam  Constitutional: She appears well-developed.  Very thin, frail, slightly diaphoretic female.    HENT:  Head: Normocephalic and atraumatic.  Eyes: EOM are normal. Scleral icterus is present.  Neck: Neck supple. No JVD present.  Cardiovascular: Normal rate.     Murmur heard. Pulmonary/Chest: Effort normal.  Crackles in the bases.  Abdominal: Soft. She exhibits no distension. There is no tenderness.  Musculoskeletal: She exhibits no edema or deformity.  Neurological: She is alert.  Pleasantly demented.  Non verbal.  Skin:  Skin is warm and dry.  Psychiatric:  Unable to assess.  Nursing note and vitals reviewed.   Vital Signs: BP (!) 109/58 (BP Location: Right Arm)   Pulse 99   Temp 99.4 F (37.4 C) (Oral)   Resp 17   SpO2 100%  Pain Assessment: Faces   Pain Score: 0-No pain   SpO2: SpO2: 100 % O2 Device:SpO2: 100 % O2 Flow Rate: .O2 Flow Rate (L/min): 2 L/min  IO: Intake/output summary:  Intake/Output Summary (Last 24 hours) at 10/13/16 1432 Last data filed at 10/13/16 5825  Gross per 24 hour  Intake             1105 ml  Output             1100 ml  Net                5 ml    LBM: Last BM Date: 10/13/16 Baseline Weight:   Most recent weight:       Palliative Assessment/Data:   Flowsheet Rows   Flowsheet Row Most Recent Value  Intake Tab  Referral Department  Hospitalist  Unit at Time of Referral  Cardiac/Telemetry Unit  Palliative Care Primary Diagnosis  Neurology  Date Notified  10/13/16  Palliative Care Type  New Palliative care  Reason for referral  Clarify Goals of Care  Date of Admission  10/12/16  Date first seen by Palliative Care  10/13/16  # of days Palliative referral response time  0 Day(s)  # of days IP prior to Palliative referral  1  Clinical Assessment  Palliative Performance Scale Score  20%  Psychosocial & Spiritual Assessment  Palliative Care Outcomes  Patient/Family meeting held?  Yes  Who was at the meeting?  patient and son Sabrina Long  Palliative Care Outcomes  Clarified goals of care  Patient/Family wishes: Interventions discontinued/not started   Mechanical Ventilation, PEG      Time In: 11;00 Time Out: 12:10 Time Total: 70 min. Greater than 50%  of this time was spent counseling  and coordinating care related to the above assessment and plan.  Signed by: Imogene Burn, PA-C Palliative Medicine Pager: [phone]  Please contact Palliative Medicine Team phone at [phone] for questions and concerns.  For individual provider: See Shea Evans

## 2016-10-13 NOTE — Progress Notes (Signed)
PROGRESS NOTE    Sabrina Long  ZOX:[id number] DOB: November 16, 1935 DOA: 10/12/2016 PCP: Bufford SpikesEED, [name], DO   Outpatient Specialists:     Brief Narrative:  Sabrina Long is a 80 y.o. female with medical history significant of advanced Alzheimer's, bedbound, chronic left hip wound with wound VAC, regular metabolic derangements, hypertension, renal artery stenosis, seizures,.   Level V caveat, patient with advanced dementia and unable to provide any history. Patient will not even communicate by blinking or nodding her head. Patient stares blankly. History provided by EDP report. Attempted to contact patient's son but was unavailable.  Patient lives at home with her son. Patient is bedbound and minimally communicative due to advanced dementia. Left hip chronic wound with wound VAC placed. Per son patient seemed more lethargic this morning when he checked on her. Patient refused to eat or drink and would spit up water after sipping it from her drink. At times patient was seem to become more alert and make eye contact. Denies any recent fevers, chills, nausea, vomiting, diarrhea, change in urine output the patient does have a chronic Foley, diarrhea. Patient is seen at the wound clinic once a week for dressing changes and wound VAC check.    Assessment & Plan:   Active Problems:   Acute encephalopathy   Alzheimer's disease   Essential hypertension   Protein-calorie malnutrition, severe (HCC)   Seizures (HCC)   Acute hypernatremia   Chronic UTI   AKI (acute kidney injury) (HCC)   Leg wound, left   Acute encephalopathy:  -combination of worsening of baseline advanced dementia, metabolic and infectious.  -UA grossly abnormal the patient has a chronic indwelling Foley catheter.  -At baseline patient is noncommunicative.  - Treatment of metabolic derangements and UTI as below - Consider DC Namenda time of discharge from the hospitalist there is likely little to no benefit of  this medicine at this patient's stage of cognitive decline.  Metabolic Derangements:  -Sodium 171, chloride 1:30, AKI  -likely secondary to severe dehydration from poor fluid oral intake.  -has had admissions for the same in the past.  -change fluid to D5W at 75/hr - BMP every 6  UTI: Patient with chronic indwelling Foley. UA grossly abnormal.  -change foley - Last urine culture grew pansensitive Escherichia coli - Ceftriaxone - Urine culture  AK I: Currently 1.5 to, BUN 52. Baseline 0.72 and 23 respectively. Secondary to dehydration. - IVF  Social: Per report patient is cared for by her son at home. There is concern for caregiver overburdened given patient's apparent condition of dehydration and chronic left hip wound and foul body odor and urine stained smell. Additional concern for patient's quality of life. - Palliative care consult for goals of care and possible hospice   Severe protein calorie malnutrition and physical deconditioning: Bedbound. Patient cachectic appearing anticipate dehydration and malnutrition likely secondary to advanced dementia and oral aversion. - PT/OT/nutrition/pre-albumin - Continue home feeding supplement  Seizure disorder: No recent reported seizures - Continue Keppra - Ativan PRN  Hypertension: Normotensive at time of admission - Continue metoprolol, losartan in the a.m.   DVT prophylaxis:  Lovenox   Code Status: Full Code  Family Communication:   Disposition Plan:     Consultants:   Palliative care     Subjective: Only opens to pain  Objective: Vitals:   10/12/16 1700 10/12/16 1751 10/12/16 2137 10/13/16 0449  BP: 165/88 131/74 139/65 (!) 129/57  Pulse: 120 (!) 116 (!) 106 (!) 113  Resp: 20  15 17 18   Temp:  98.8 F (37.1 C) 98.1 F (36.7 C) 100.3 F (37.9 C)  TempSrc:  Oral Oral Oral  SpO2: 100% 100% 100% 100%    Intake/Output Summary (Last 24 hours) at 10/13/16 1610 Last data filed at 10/13/16 0500   Gross per 24 hour  Intake             1105 ml  Output             1100 ml  Net                5 ml   There were no vitals filed for this visit.  Examination:  General exam: opens eyes to pain Respiratory system: no abnormal sounds heard- poor cooperation Respiratory effort normal. Cardiovascular system: fast, No pedal edema. Gastrointestinal system: Abdomen is nondistended, soft and nontender. No organomegaly or masses felt. Normal bowel sounds heard. Central nervous system: Alert and oriented. No focal neurological deficits.    Data Reviewed: I have personally reviewed following labs and imaging studies  CBC:  Recent Labs Lab 10/12/16 1300 10/13/16 0448  WBC 14.9* 15.4*  NEUTROABS 10.4*  --   HGB 10.4* 8.8*  HCT 34.0* 30.0*  MCV 91.9 93.2  PLT 366 345   Basic Metabolic Panel:  Recent Labs Lab 10/12/16 1757 10/12/16 2101 10/13/16 0021 10/13/16 0448 10/13/16 0748  NA 170* 169* 169* 171* 170*  K 3.4* 3.4* 3.3* 3.4* 3.4*  CL >130* >130* >130* >130* >130*  CO2 24 23 22 22  21*  GLUCOSE 154* 145* 153* 152* 144*  BUN 49* 51* 53* 54* 54*  CREATININE 1.48* 1.55* 1.54* 1.73* 1.68*  CALCIUM 9.4 9.2 9.0 9.0 8.6*  MG 2.6*  --   --   --   --   PHOS 3.0  --   --   --   --    GFR: CrCl cannot be calculated (Unknown ideal weight.). Liver Function Tests:  Recent Labs Lab 10/12/16 1300  AST 24  ALT 49  ALKPHOS 77  BILITOT 0.3  PROT 9.2*  ALBUMIN 2.6*   No results for input(s): LIPASE, AMYLASE in the last 168 hours. No results for input(s): AMMONIA in the last 168 hours. Coagulation Profile: No results for input(s): INR, PROTIME in the last 168 hours. Cardiac Enzymes: No results for input(s): CKTOTAL, CKMB, CKMBINDEX, TROPONINI in the last 168 hours. BNP (last 3 results) No results for input(s): PROBNP in the last 8760 hours. HbA1C: No results for input(s): HGBA1C in the last 72 hours. CBG: No results for input(s): GLUCAP in the last 168 hours. Lipid  Profile: No results for input(s): CHOL, HDL, LDLCALC, TRIG, CHOLHDL, LDLDIRECT in the last 72 hours. Thyroid Function Tests: No results for input(s): TSH, T4TOTAL, FREET4, T3FREE, THYROIDAB in the last 72 hours. Anemia Panel: No results for input(s): VITAMINB12, FOLATE, FERRITIN, TIBC, IRON, RETICCTPCT in the last 72 hours. Urine analysis:    Component Value Date/Time   COLORURINE YELLOW 10/12/2016 1429   APPEARANCEUR TURBID (A) 10/12/2016 1429   LABSPEC 1.013 10/12/2016 1429   PHURINE 8.0 10/12/2016 1429   GLUCOSEU NEGATIVE 10/12/2016 1429   HGBUR SMALL (A) 10/12/2016 1429   BILIRUBINUR NEGATIVE 10/12/2016 1429   BILIRUBINUR neg 01/22/2014 1319   KETONESUR NEGATIVE 10/12/2016 1429   PROTEINUR >=300 (A) 10/12/2016 1429   UROBILINOGEN 0.2 07/22/2015 1903   NITRITE POSITIVE (A) 10/12/2016 1429   LEUKOCYTESUR MODERATE (A) 10/12/2016 1429     )No results found for this or any  previous visit (from the past 240 hour(s)).    Anti-infectives    Start     Dose/Rate Route Frequency Ordered Stop   10/12/16 1700  cefTRIAXone (ROCEPHIN) 1 g in dextrose 5 % 50 mL IVPB     1 g 100 mL/hr over 30 Minutes Intravenous Every 24 hours 10/12/16 1645         Radiology Studies: Dg Chest 1 View  Result Date: 10/12/2016 CLINICAL DATA:  Altered mental status and weakness EXAM: CHEST 1 VIEW COMPARISON:  02/02/2016 FINDINGS: Cardiac shadow is within normal limits. Lungs are well aerated with mild chronic changes bilaterally. No focal infiltrate or sizable effusion is seen. No bony abnormality is seen. IMPRESSION: Chronic changes without acute abnormality. Electronically Signed   By: Alcide CleverMark  Lukens M.D.   On: 10/12/2016 13:25   Ct Head Wo Contrast  Result Date: 10/12/2016 CLINICAL DATA:  Altered mental status this morning, now resolved. History of seizure in dementia. EXAM: CT HEAD WITHOUT CONTRAST TECHNIQUE: Contiguous axial images were obtained from the base of the skull through the vertex without  intravenous contrast. COMPARISON:  02/16/2015 brain MRI and 02/15/2015 CT FINDINGS: Brain: There is no evidence of acute cortical infarct, intracranial hemorrhage, mass, midline shift, or extra-axial fluid collection. Advanced cerebral atrophy and extensive chronic small vessel white matter disease are similar to the prior studies. Vascular: Calcified atherosclerosis at the skullbase. No hyperdense vessel. Skull: No fracture or focal osseous lesion. Sinuses/Orbits: Visualized paranasal sinuses and mastoid air cells are clear. No acute findings in the imaged portions of the orbits. Other: None. IMPRESSION: 1. No evidence of acute intracranial abnormality. 2. Advanced cerebral atrophy and chronic small vessel ischemic disease. Electronically Signed   By: Sebastian AcheAllen  Grady M.D.   On: 10/12/2016 14:10        Scheduled Meds: . antiseptic oral rinse  7 mL Mouth Rinse BID  . cefTRIAXone (ROCEPHIN)  IV  1 g Intravenous Q24H  . enoxaparin (LOVENOX) injection  40 mg Subcutaneous Q24H  . feeding supplement (ENSURE ENLIVE)  237 mL Oral TID BM  . feeding supplement (PRO-STAT 64)  30 mL Oral TID WC  . levETIRAcetam  500 mg Intravenous Q12H  . losartan  25 mg Oral Daily  . memantine  10 mg Oral BID  . metoprolol tartrate  25 mg Oral BID  . sodium chloride flush  3 mL Intravenous Q12H   Continuous Infusions: . dextrose       LOS: 1 day    Time spent: 35 min    Broly Hatfield U Nona Gracey, DO [facility] Pager [phone]  If 7PM-7AM, please contact night-coverage [email or web address] Password [email or web address] 10/13/2016, 9:23 AM

## 2016-10-13 NOTE — Evaluation (Signed)
Clinical/Bedside Swallow Evaluation Patient Details  Name: Consuello Closssterline P Grey MRN: [id number] Date of Birth: 02-Nov-1935  Today's Date: 10/13/2016 Time: SLP Start Time (ACUTE ONLY): 0908 SLP Stop Time (ACUTE ONLY): [id number] SLP Time Calculation (min) (ACUTE ONLY): 14 min  Past Medical History:  Past Medical History:  Diagnosis Date  . Abnormality of gait   . Alzheimer's disease   . Anxiety state   . Candidiasis of vulva and vagina   . Chest pain, unspecified 08/19/2008  . Colitis presumed to be due to infection 11/02/2011   enteritis, and gastroenteritis  . Constipation   . Cystitis   . Dementia with behavioral disturbance   . Depressive disorder   . Headache   . Hypercalcemia   . Hyperlipidemia   . Hyperosmolality and/or hypernatremia   . Hypertension, essential   . Impetigo   . Liver disorder   . Memory loss 07/24/2007  . Osteoarthrosis   . Pneumonia 11/02/2011  . Reactive confusion   . Reflux esophagitis   . Renal artery stenosis (HCC)   . Seborrheic keratosis   . Seizures (HCC)   . Stiffness of joint   . Undiagnosed cardiac murmurs   . Weight loss    Past Surgical History:  Past Surgical History:  Procedure Laterality Date  . CESAREAN SECTION     HPI:  Tanishi P Morrisonis a 80 y.o.femalewith medical history significant of advanced Alzheimer's, bedbound, chronic left hip wound with wound VAC, regular metabolic derangements, hypertension, renal artery stenosis, seizures. Chest x ray with chronic changes without acute abnormality   Assessment / Plan / Recommendation Clinical Impression  Pt presents with a cognitive based dysphagia secondary to advanced dementia. Son present at bedside during PO trials. Pt without lingual manipulation or swallow sequence despite thermal stimulation and multimodal cueing. No swallow initiation evidenced during limited PO trials. Pt with anterior spillage and poor labial seal of all bolus consistencies (ice chips, tsp water, 1/4 tsp  puree).  Oral care performed prior to and follow trials. Educated pts son regarding recommendation for NPO until pt mentation improves for swallow function as aspiration risk is significant. Recommend NPO with medicines via alternative means. ST to closely monitor for PO readiness and goals of care.     Aspiration Risk  Severe aspiration risk;Risk for inadequate nutrition/hydration    Diet Recommendation   NPO  Medication Administration: Via alternative means    Other  Recommendations Oral Care Recommendations: Oral care QID   Follow up Recommendations 24 hour supervision/assistance      Frequency and Duration min 2x/week  1 week       Prognosis Prognosis for Safe Diet Advancement: Guarded Barriers to Reach Goals: Severity of deficits;Cognitive deficits      Swallow Study   General Date of Onset: 10/12/16 HPI: Braiden P Morrisonis a 80 y.o.femalewith medical history significant of advanced Alzheimer's, bedbound, chronic left hip wound with wound VAC, regular metabolic derangements, hypertension, renal artery stenosis, seizures. Chest x ray with chronic changes without acute abnormality Type of Study: Bedside Swallow Evaluation Previous Swallow Assessment: BSE: chopped, thin  Diet Prior to this Study: NPO Temperature Spikes Noted: Yes Respiratory Status: Room air History of Recent Intubation: No Behavior/Cognition: Lethargic/Drowsy;Requires cueing;Doesn't follow directions Oral Cavity Assessment: Dried secretions;Dry Oral Care Completed by SLP: Yes Oral Cavity - Dentition: Missing dentition Vision: Impaired for self-feeding Self-Feeding Abilities: Total assist Patient Positioning: Upright in bed Baseline Vocal Quality: Not observed Volitional Cough: Cognitively unable to elicit    Oral/Motor/Sensory Function Overall Oral Motor/Sensory  Function: Generalized oral weakness (cognitively unable to follow commands for formal OME)   Ice Chips Ice chips:  Impaired Presentation: Spoon Oral Phase Impairments: Reduced labial seal;Reduced lingual movement/coordination;Poor awareness of bolus Oral Phase Functional Implications: Oral holding Pharyngeal Phase Impairments: Unable to trigger swallow   Thin Liquid Thin Liquid: Impaired Presentation: Spoon;Straw Oral Phase Impairments: Reduced labial seal;Reduced lingual movement/coordination;Poor awareness of bolus Oral Phase Functional Implications: Oral holding Pharyngeal  Phase Impairments: Unable to trigger swallow    Nectar Thick Nectar Thick Liquid: Not tested   Honey Thick Honey Thick Liquid: Not tested   Puree Puree: Impaired Presentation: Spoon Oral Phase Impairments: Poor awareness of bolus Oral Phase Functional Implications: Oral holding Pharyngeal Phase Impairments: Unable to trigger swallow   Solid   GO   Solid: Not tested       Marcene Duoshelsea Sumney MA, CCC-SLP Acute Care Speech Language Pathologist    Marcene DuosSumney, Daishaun Ayre E 10/13/2016,9:42 AM

## 2016-10-13 NOTE — Evaluation (Signed)
Occupational Therapy Evaluation and Discharge Patient Details Name: Sabrina Long P Rena MRN: [id number] DOB: 1936-08-29 Today's Date: 10/13/2016    History of Present Illness 80 y.o. female with medical history significant of advanced Alzheimer's, bedbound, chronic left hip wound with wound VAC, regular metabolic derangements, hypertension, renal artery stenosis, seizures. Pt presenting with increased lethargy.   Clinical Impression   Per chart review, pt bed bound and dependent with ADL at baseline. Currently pt requires total assist for ADL and bed mobility for repositioning. Plan for pt to d/c home with level of care provided PTA. No acute OT needs identified; signing off at this time. Please re-consult if needs change. Thank you for this referral.    Follow Up Recommendations  No OT follow up;Supervision/Assistance - 24 hour    Equipment Recommendations  None recommended by OT    Recommendations for Other Services       Precautions / Restrictions Precautions Precautions: Fall Restrictions Weight Bearing Restrictions: No      Mobility Bed Mobility Overal bed mobility: Needs Assistance Bed Mobility: Rolling Rolling: Total assist;+2 for physical assistance         General bed mobility comments: Total assist for rolling to reposition in bed.  Transfers                      Balance                                            ADL Overall ADL's : Needs assistance/impaired                                       General ADL Comments: Pt total assist for ADL at bed level.     Vision Additional Comments: Unable to assess due to impaired cognition   Perception     Praxis      Pertinent Vitals/Pain Pain Assessment: Faces Faces Pain Scale: Hurts little more Pain Location: grimacing with movement Pain Descriptors / Indicators: Grimacing Pain Intervention(s): Repositioned     Hand Dominance     Extremity/Trunk  Assessment Upper Extremity Assessment Upper Extremity Assessment: RUE deficits/detail;LUE deficits/detail RUE Deficits / Details: No active ROM noted. Limited PROM due to contractures. LUE Deficits / Details: No active ROM noted. Limited PROM due to contractures.   Lower Extremity Assessment Lower Extremity Assessment: Defer to PT evaluation       Communication Communication Communication: Other (comment) (non communicative at baseline)   Cognition Arousal/Alertness: Awake/alert Behavior During Therapy: Flat affect Overall Cognitive Status: History of cognitive impairments - at baseline                     General Comments       Exercises       Shoulder Instructions      Home Living Family/patient expects to be discharged to:: Private residence Living Arrangements: Children (son)                               Additional Comments: Per chart review, pt from home with son.      Prior Functioning/Environment Level of Independence: Needs assistance  Gait / Transfers Assistance Needed: per chart review, pt bedbound at baseline ADL's /  Homemaking Assistance Needed: dependent with ADL            OT Problem List:     OT Treatment/Interventions:      OT Goals(Current goals can be found in the care plan section) Acute Rehab OT Goals OT Goal Formulation: All assessment and education complete, DC therapy  OT Frequency:     Barriers to D/C:            Co-evaluation              End of Session Nurse Communication: Mobility status  Activity Tolerance: Patient tolerated treatment well Patient left: in bed;with call bell/phone within reach;with bed alarm set   Time: 1209-1225 OT Time Calculation (min): 16 min Charges:  OT General Charges $OT Visit: 1 Procedure OT Evaluation $OT Eval Moderate Complexity: 1 Procedure G-Codes:     Gaye AlkenBailey A Kaidon Kinker M.S., OTR/L Pager: [phone]: [phone]  10/13/2016, 2:06 PM

## 2016-10-13 NOTE — Progress Notes (Signed)
CRITICAL VALUE ALERT  Critical value received:  Sodium 169, chloride >130  Date of notification:  10/13/2016  Time of notification:  0142  Critical value read back: yes  Nurse who received alert:  Jamelle Rushingleticia Craig Ionescu  MD notified (1st page):  Craige CottaKirby  Time of first page:  0222  MD notified (2nd page):   Time of second page:  Responding MD:  Not yet responded  Time MD responded:  Awaiting response

## 2016-10-13 NOTE — Progress Notes (Signed)
CRITICAL VALUE ALERT  Critical value received:  Sodium 171, chloride >130  Date of notification:  10/13/2016  Time of notification:  0536  Critical value read back: yes  Nurse who received alert: Jamelle Rushingleticia Savanna Dooley  MD notified (1st page):  Craige CottaKirby  Time of first page:  0542  MD notified (2nd page):  Time of second page:  Responding MD:  Not yet responded  Time MD responded:  Awaiting response

## 2016-10-13 NOTE — Consult Note (Addendum)
WOC Nurse wound consult note Reason for Consult: Consult requested for sacrum and left ischium.  Pt is followed by the outpatient wound care center weekly for assessments, and they are pleased with the wound appearances, according to the patient's son at the bedside.  She has a home Vac machine applied to her left ischium wound, and this is changed by [employer].  The patient's son recommended we apply a moist gauze dressing and change Q day, until the patient returns home and they can re-apply the Vac dressing.  The home Vac unit was removed and son states he will take it home.  There is no charger or case.  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  There is a strong foul odor when the Vac sponge dressing and a piece of gauze packing was removed, and mod amt tan drainage in the Vac cannister before removal.  Bone is palpable with a swab. Sacrum and bilat buttocks with pink dry scar tissue surrounding previous wounds which have healed.  Remiaining open wound is stage 4 pressure injury; .3X.3X.3cm, red and moist, mod amt yellow drainage, no odor, white macerated skin to wound edges from frequent moisture. Bone palpable with swab. Pressure Ulcer POA: Yes Dressing procedure/placement/frequency: Moist gauze dressing to left ischium and patient can resume use of Vac after discharge home.  Continue present plan of care as recommended by the wound care center with Aquacel to sacrum to absorb drainage and provide antibacterial benefits. Air mattress to reduce pressure.  Discussed plan of care with son at the bedside and he verbalized understanding. Please re-consult if further assistance is needed.  Thank-you,  Cammie Mcgeeawn Rajni Holsworth MSN, RN, CWOCN, HooperWCN-AP, CNS [phone]

## 2016-10-14 DIAGNOSIS — A4901 Methicillin susceptible Staphylococcus aureus infection, unspecified site: Secondary | ICD-10-CM

## 2016-10-14 DIAGNOSIS — R569 Unspecified convulsions: Secondary | ICD-10-CM

## 2016-10-14 LAB — BASIC METABOLIC PANEL
ANION GAP: 10 (ref 5–15)
BUN: 54 mg/dL — ABNORMAL HIGH (ref 6–20)
BUN: 45 mg/dL — ABNORMAL HIGH (ref 6–20)
BUN: 42 mg/dL — AB (ref 6–20)
BUN: 38 mg/dL — ABNORMAL HIGH (ref 6–20)
CALCIUM: 8.6 mg/dL — AB (ref 8.9–10.3)
CALCIUM: 8.6 mg/dL — AB (ref 8.9–10.3)
CO2: 21 mmol/L — AB (ref 22–32)
CO2: 21 mmol/L — ABNORMAL LOW (ref 22–32)
CO2: 21 mmol/L — AB (ref 22–32)
CO2: 21 mmol/L — ABNORMAL LOW (ref 22–32)
CREATININE: 1.6 mg/dL — AB (ref 0.44–1.00)
Calcium: 8.9 mg/dL (ref 8.9–10.3)
Calcium: 8.4 mg/dL — ABNORMAL LOW (ref 8.9–10.3)
Chloride: >130 mmol/L (ref 101–111)
Chloride: 129 mmol/L — ABNORMAL HIGH (ref 101–111)
Creatinine, Ser: 1.68 mg/dL — ABNORMAL HIGH (ref 0.44–1.00)
Creatinine, Ser: 1.8 mg/dL — ABNORMAL HIGH (ref 0.44–1.00)
Creatinine, Ser: 1.76 mg/dL — ABNORMAL HIGH (ref 0.44–1.00)
GFR calc Af Amer: 34 mL/min — ABNORMAL LOW (ref >60)
GFR calc Af Amer: 31 mL/min — ABNORMAL LOW (ref >60)
GFR calc non Af Amer: 28 mL/min — ABNORMAL LOW (ref >60)
GFR calc non Af Amer: 26 mL/min — ABNORMAL LOW (ref >60)
GFR calc non Af Amer: 30 mL/min — ABNORMAL LOW (ref >60)
GFR, EST AFRICAN AMERICAN: 33 mL/min — AB (ref >60)
GFR, EST AFRICAN AMERICAN: 30 mL/min — AB (ref >60)
GFR, EST NON AFRICAN AMERICAN: 27 mL/min — AB (ref >60)
GLUCOSE: 173 mg/dL — AB (ref 65–99)
Glucose, Bld: 144 mg/dL — ABNORMAL HIGH (ref 65–99)
Glucose, Bld: 182 mg/dL — ABNORMAL HIGH (ref 65–99)
Glucose, Bld: 157 mg/dL — ABNORMAL HIGH (ref 65–99)
POTASSIUM: 3 mmol/L — AB (ref 3.5–5.1)
POTASSIUM: 3.1 mmol/L — AB (ref 3.5–5.1)
POTASSIUM: 3 mmol/L — AB (ref 3.5–5.1)
Potassium: 3.4 mmol/L — ABNORMAL LOW (ref 3.5–5.1)
SODIUM: 167 mmol/L — AB (ref 135–145)
SODIUM: 166 mmol/L — AB (ref 135–145)
SODIUM: 160 mmol/L — AB (ref 135–145)
Sodium: 170 mmol/L (ref 135–145)

## 2016-10-14 LAB — CBC
HEMATOCRIT: 27.7 % — AB (ref 36.0–46.0)
HEMOGLOBIN: 8 g/dL — AB (ref 12.0–15.0)
MCH: 26.8 pg (ref 26.0–34.0)
MCHC: 28.9 g/dL — AB (ref 30.0–36.0)
MCV: 92.6 fL (ref 78.0–100.0)
Platelets: 317 10*3/uL (ref 150–400)
RBC: 2.99 MIL/uL — AB (ref 3.87–5.11)
RDW: 17.3 % — ABNORMAL HIGH (ref 11.5–15.5)
WBC: 13.1 10*3/uL — ABNORMAL HIGH (ref 4.0–10.5)

## 2016-10-14 MED ORDER — POTASSIUM CL IN DEXTROSE 5% 20 MEQ/L IV SOLN
20.0000 meq | INTRAVENOUS | Status: DC
  Administered 2016-10-14 – 2016-10-15 (×2): 20 meq via INTRAVENOUS
  Filled 2016-10-14 (×2): qty 1000

## 2016-10-14 MED ORDER — ENOXAPARIN SODIUM 30 MG/0.3ML ~~LOC~~ SOLN
30.0000 mg | SUBCUTANEOUS | Status: DC
  Administered 2016-10-14 – 2016-10-17 (×4): 30 mg via SUBCUTANEOUS
  Filled 2016-10-14 (×4): qty 0.3

## 2016-10-14 MED ORDER — VANCOMYCIN HCL 10 G IV SOLR
1500.0000 mg | Freq: Once | INTRAVENOUS | Status: AC
  Administered 2016-10-14: 1500 mg via INTRAVENOUS
  Filled 2016-10-14: qty 1500

## 2016-10-14 MED ORDER — VANCOMYCIN HCL IN DEXTROSE 1-5 GM/200ML-% IV SOLN: 1000.0000 mg | INTRAVENOUS | Status: DC

## 2016-10-14 NOTE — Progress Notes (Addendum)
PROGRESS NOTE [facility]   Sabrina Long   WUJ:[id number] DOB: 09/08/1936  DOA: 10/12/2016 PCP: Bufford Spikes, DO   Brief Narrative:  Sabrina Rakers Morrisonis a 80 y.o.femalewith medical history significant of advanced Alzheimer's, bedbound, chronic left hip wound with wound VAC, regular metabolic derangements, hypertension, renal artery stenosis, seizures.  Patient lives at home with Sabrina Long son. Patient is bedbound and minimally communicative due to advanced dementia. Left hip chronic wound with wound VAC placed. Per son patient seemed more lethargic this morning when he checked on Sabrina Long. Patient refused to eat or drink and would spit up water after sipping it from Sabrina Long drink. At times patient was seem to become more alert and make eye contact. Denies any recent fevers, chills, nausea, vomiting, diarrhea, change in urine output the patient does have a chronic Foley, diarrhea. Patient is seen at the wound clinic once a week for dressing changes and wound VAC check.  Subjective: Patient non verbal this morning, sleeping comfortable, per daughter in law patient seems to be better today. No acute events overnight.    Assessment & Plan: Acute encephalopathy: - per family member she seems to be better today  combination of worsening of baseline advanced dementia, metabolic and infectious.  UA grossly abnormal the patient has a chronic indwelling Foley catheter.  At baseline patient is noncommunicative.  Treatment of electrolyte imbalance and UTI as below Consider DC Namenda time of discharge from the hospitalist there is likely little to no benefit of this medicine at this patient's stage of cognitive decline.  Hypernatremia, Hypokalemia  Sodium 171, chloride 1:30, AKI  likely secondary to severe dehydration from poor fluid oral intake.  has had admissions for the same in the past.  Change fluids to D5 +KCL  BMP every 12 hrs   UTI 2/2 to foley cath: Patient with chronic  indwelling Foley. UA grossly abnormal. - Urin cx grow staph aureus  change foley D/c Ceftriaxone Will add vanco until sensitivities are resulted   Sensitivities pending  Will get blood cultures as well   AKI: Currently 1.5 to, BUN 52. Baseline 0.72 and 23 respectively. Secondary to dehydration. - improving with IVF Continue IVF as above   Social: Per report patient is cared for by Sabrina Long son at home. There is concern for caregiver overburdened given patient's apparent condition of dehydration and chronic left hip wound and foul body odor and urine stained smell. Additional concern for patient's quality of life. Palliative care consult for goals of care and possible hospice  SNF   Severe protein calorie malnutrition and physical deconditioning: Bedbound. Patient cachectic appearing anticipate dehydration and malnutrition likely secondary to advanced dementia and oral aversion. PT/OT/nutrition/pre-albumin Continue home feeding supplement  Seizure disorder: No recent reported seizures Continue Keppra Ativan PRN  Hypertension - stable  Antihypertensive meds where hold at the time of admission  Will resume metoprolol  Hold losatan given increase in Cr  DVT prophylaxis: Lovenox Code Status: DNR Family Communication: Family at bedside Disposition Plan: SNF  Consultants:   None  Antimicrobials:  Ceftriaxone   Vanco    Objective: Vitals:   10/13/16 2124 10/14/16 0400 10/14/16 0556 10/14/16 1412  BP: 128/64  (!) 133/55 124/83  Pulse: 91  84 (!) 106  Resp: 16  15 20   Temp: 98.4 F (36.9 C)  98.3 F (36.8 C)   TempSrc: Oral  Oral   SpO2: 100%  100% 100%  Weight:  84.8 kg (187 lb)    Height:  5\' 3"  (1.6  m)      Intake/Output Summary (Last 24 hours) at 10/14/16 1728 Last data filed at 10/14/16 1632  Gross per 24 hour  Intake           318.75 ml  Output             1400 ml  Net         -1081.25 ml   Filed Weights   10/14/16 0400  Weight: 84.8 kg (187 lb)     Examination:  General exam: Sleeping comfortable, open eye to painful stimuli  Respiratory system: Difficult exam, but no gross abnormal sounds  Cardiovascular system: S1 & S2 heard, RRR. No JVD, murmurs, rubs or gallops Gastrointestinal system: Soft NTND  Central nervous system: Alert and oriented.  Extremities: No pedal edema.  Data Reviewed: I have personally reviewed following labs and imaging studies  CBC:  Recent Labs Lab 10/12/16 1300 10/13/16 0448 10/14/16 0251  WBC 14.9* 15.4* 13.1*  NEUTROABS 10.4*  --   --   HGB 10.4* 8.8* 8.0*  HCT 34.0* 30.0* 27.7*  MCV 91.9 93.2 92.6  PLT 366 345 317   Basic Metabolic Panel:  Recent Labs Lab 10/12/16 1757  10/13/16 0748 10/13/16 1236 10/13/16 2114 10/14/16 0251 10/14/16 0839  NA 170*  < > 170* 168* 168* 167* 166*  K 3.4*  < > 3.4* 3.2* 3.1* 3.0* 3.1*  CL >130*  < > >130* >130* >130* >130* >130*  CO2 24  < > 21* 21* 20* 21* 21*  GLUCOSE 154*  < > 144* 164* 174* 173* 182*  BUN 49*  < > 54* 49* 48* 45* 42*  CREATININE 1.48*  < > 1.68* 1.69* 1.85* 1.80* 1.60*  CALCIUM 9.4  < > 8.6* 8.7* 8.7* 8.6* 8.9  MG 2.6*  --   --   --   --   --   --   PHOS 3.0  --   --   --   --   --   --   < > = values in this interval not displayed. GFR: Estimated Creatinine Clearance: 29.9 mL/min (by C-G formula based on SCr of 1.6 mg/dL (H)). Liver Function Tests:  Recent Labs Lab 10/12/16 1300  AST 24  ALT 49  ALKPHOS 77  BILITOT 0.3  PROT 9.2*  ALBUMIN 2.6*   No results for input(s): LIPASE, AMYLASE in the last 168 hours. No results for input(s): AMMONIA in the last 168 hours. Coagulation Profile: No results for input(s): INR, PROTIME in the last 168 hours. Cardiac Enzymes: No results for input(s): CKTOTAL, CKMB, CKMBINDEX, TROPONINI in the last 168 hours. BNP (last 3 results) No results for input(s): PROBNP in the last 8760 hours. HbA1C: No results for input(s): HGBA1C in the last 72 hours. CBG: No results for  input(s): GLUCAP in the last 168 hours. Lipid Profile: No results for input(s): CHOL, HDL, LDLCALC, TRIG, CHOLHDL, LDLDIRECT in the last 72 hours. Thyroid Function Tests: No results for input(s): TSH, T4TOTAL, FREET4, T3FREE, THYROIDAB in the last 72 hours. Anemia Panel: No results for input(s): VITAMINB12, FOLATE, FERRITIN, TIBC, IRON, RETICCTPCT in the last 72 hours. Sepsis Labs: No results for input(s): PROCALCITON, LATICACIDVEN in the last 168 hours.  Recent Results (from the past 240 hour(s))  Urine culture     Status: Abnormal (Preliminary result)   Collection Time: 10/12/16  2:29 PM  Result Value Ref Range Status   Specimen Description URINE, RANDOM  Final   Special Requests NONE  Final  Culture (A)  Final    >=100,000 COLONIES/mL STAPHYLOCOCCUS AUREUS SUSCEPTIBILITIES TO FOLLOW    Report Status PENDING  Incomplete     Radiology Studies: No results found.   Scheduled Meds: . antiseptic oral rinse  7 mL Mouth Rinse BID  . cefTRIAXone (ROCEPHIN)  IV  1 g Intravenous Q24H  . enoxaparin (LOVENOX) injection  30 mg Subcutaneous Q24H  . feeding supplement (ENSURE ENLIVE)  237 mL Oral TID BM  . feeding supplement (PRO-STAT 64)  30 mL Oral TID WC  . levETIRAcetam  500 mg Intravenous Q12H  . sodium chloride flush  3 mL Intravenous Q12H   Continuous Infusions: . dextrose 5 % with KCl 20 mEq / L 20 mEq (10/14/16 1145)     LOS: 2 days    Latrelle DodrillEdwin Silva, MD [facility] Pager [phone]  If 7PM-7AM, please contact night-coverage [email or web address] Password [email or web address] 10/14/2016, 5:28 PM

## 2016-10-14 NOTE — Progress Notes (Signed)
Pharmacy Antibiotic Note  Sabrina Long is a 80 y.o. female admitted on 10/12/2016 with UTI.  Pharmacy has been consulted for vancomycin dosing after urine cultures grew S. Aureus. Susceptibilities pending and blood cultures have been sent.  Plan: -Discontinue ceftriaxone -Vancomycin 1500mg  IV x1 -Vancomycin 1000mg  IV q24h starting 12/24  Height: 5\' 3"  (160 cm) Weight: 187 lb (84.8 kg) IBW/kg (Calculated) : 52.4  Temp (24hrs), Avg:98.4 F (36.9 C), Min:98.3 F (36.8 C), Max:98.4 F (36.9 C)   Recent Labs Lab 10/12/16 1300  10/13/16 0448 10/13/16 0748 10/13/16 1236 10/13/16 2114 10/14/16 0251 10/14/16 0839  WBC 14.9*  --  15.4*  --   --   --  13.1*  --   CREATININE 1.52*  < > 1.73* 1.68* 1.69* 1.85* 1.80* 1.60*  < > = values in this interval not displayed.  Estimated Creatinine Clearance: 29.9 mL/min (by C-G formula based on SCr of 1.6 mg/dL (H)).    Allergies  Allergen Reactions  . Risperdal [Risperidone] Other (See Comments)    sedation    Antimicrobials this admission: 12/21 Ceftriaxone >> 12/23 12/23 Vancomycin >>   Dose adjustments this admission: none  Microbiology results: 12/23 BCx: IP 12/23 UCx: S. aureus    Thank you for allowing pharmacy to be a part of this patient's care.  Fredonia HighlandMichael Bitonti, PharmD PGY-1 Pharmacy Resident Pager: [phone] 10/14/2016

## 2016-10-14 NOTE — Progress Notes (Addendum)
CRITICAL VALUE ALERT  Critical value received:  Sodium 168   Chloride >130  Date of notification:  10/13/16  Time of notification:  2154  Critical value read back:Yes.    Nurse who received alert:  Iona HansenKelli Faydra Korman RN   MD notified (1st page): Amy 10/13/16  Time of first page:  2218  MD notified (2nd page):  Time of second page:  Responding MD:  Amy   Time MD responded:  2224

## 2016-10-14 NOTE — Progress Notes (Signed)
SLP Cancellation Note  Patient Details Name: Sabrina Long MRN: [id number] DOB: 06-May-1936   Cancelled treatment:       Reason Eval/Treat Not Completed: Medical issues which prohibited therapy; family unavailable; palliative consult completed; pt unable to arouse despite several attempts.   Tavie Haseman,PAT, M.S., CCC-SLP 10/14/2016, 11:46 AM

## 2016-10-14 NOTE — Progress Notes (Signed)
CRITICAL VALUE ALERT  Critical value received:  Sodium 167  Chloride >130  Date of notification:  10/14/16  Time of notification:  0333  Critical value read back:Yes.    Nurse who received alert:  Iona HansenKelli Xin Klawitter RN   MD notified (1st page):  Amy 10/13/16  Time of first page:  0335  MD notified (2nd page):  Time of second page:  Responding MD:  Amy   Time MD responded:  [phone]

## 2016-10-15 DIAGNOSIS — A4902 Methicillin resistant Staphylococcus aureus infection, unspecified site: Secondary | ICD-10-CM

## 2016-10-15 LAB — BASIC METABOLIC PANEL
BUN: 32 mg/dL — ABNORMAL HIGH (ref 6–20)
BUN: 32 mg/dL — ABNORMAL HIGH (ref 6–20)
CALCIUM: 8.8 mg/dL — AB (ref 8.9–10.3)
CO2: 21 mmol/L — AB (ref 22–32)
CO2: 19 mmol/L — ABNORMAL LOW (ref 22–32)
Calcium: 9 mg/dL (ref 8.9–10.3)
Creatinine, Ser: 1.52 mg/dL — ABNORMAL HIGH (ref 0.44–1.00)
Creatinine, Ser: 1.62 mg/dL — ABNORMAL HIGH (ref 0.44–1.00)
GFR calc Af Amer: 37 mL/min — ABNORMAL LOW (ref >60)
GFR calc non Af Amer: 32 mL/min — ABNORMAL LOW (ref >60)
GFR, EST AFRICAN AMERICAN: 34 mL/min — AB (ref >60)
GFR, EST NON AFRICAN AMERICAN: 29 mL/min — AB (ref >60)
GLUCOSE: 158 mg/dL — AB (ref 65–99)
Glucose, Bld: 149 mg/dL — ABNORMAL HIGH (ref 65–99)
POTASSIUM: 5.8 mmol/L — AB (ref 3.5–5.1)
Potassium: 3.1 mmol/L — ABNORMAL LOW (ref 3.5–5.1)
SODIUM: 165 mmol/L — AB (ref 135–145)
Sodium: 166 mmol/L (ref 135–145)

## 2016-10-15 LAB — CBC WITH DIFFERENTIAL/PLATELET
BASOS PCT: 0 %
Basophils Absolute: 0 10*3/uL (ref 0.0–0.1)
EOS ABS: 0.3 10*3/uL (ref 0.0–0.7)
EOS PCT: 2 %
HEMATOCRIT: 27.6 % — AB (ref 36.0–46.0)
Hemoglobin: 8.1 g/dL — ABNORMAL LOW (ref 12.0–15.0)
Lymphocytes Relative: 34 %
Lymphs Abs: 4.6 10*3/uL — ABNORMAL HIGH (ref 0.7–4.0)
MCH: 27.1 pg (ref 26.0–34.0)
MCHC: 29.3 g/dL — ABNORMAL LOW (ref 30.0–36.0)
MCV: 92.3 fL (ref 78.0–100.0)
MONO ABS: 0.4 10*3/uL (ref 0.1–1.0)
Monocytes Relative: 3 %
NEUTROS ABS: 8.2 10*3/uL — AB (ref 1.7–7.7)
NEUTROS PCT: 61 %
Platelets: 295 10*3/uL (ref 150–400)
RBC: 2.99 MIL/uL — ABNORMAL LOW (ref 3.87–5.11)
RDW: 17.1 % — AB (ref 11.5–15.5)
WBC: 13.5 10*3/uL — ABNORMAL HIGH (ref 4.0–10.5)

## 2016-10-15 LAB — URINE CULTURE: Culture: >=100000 — AB

## 2016-10-15 MED ORDER — FUROSEMIDE 10 MG/ML IJ SOLN
40.0000 mg | Freq: Once | INTRAMUSCULAR | Status: AC
  Administered 2016-10-15: 40 mg via INTRAVENOUS
  Filled 2016-10-15: qty 4

## 2016-10-15 MED ORDER — VANCOMYCIN HCL 500 MG IV SOLR
500.0000 mg | INTRAVENOUS | Status: DC
  Administered 2016-10-15 – 2016-10-16 (×2): 500 mg via INTRAVENOUS
  Filled 2016-10-15 (×2): qty 500

## 2016-10-15 MED ORDER — DEXTROSE 5 % IV SOLN
INTRAVENOUS | Status: DC
  Administered 2016-10-15: 18:00:00 via INTRAVENOUS

## 2016-10-15 MED ORDER — POTASSIUM CHLORIDE 2 MEQ/ML IV SOLN
INTRAVENOUS | Status: DC
  Administered 2016-10-15: 12:00:00 via INTRAVENOUS
  Filled 2016-10-15: qty 1000

## 2016-10-15 NOTE — Progress Notes (Signed)
PROGRESS NOTE [facility]   Sabrina Long   NWG:[id number] DOB: July 19, 1936  DOA: 10/12/2016 PCP: Bufford SpikesEED, [name], DO   Brief Narrative:  Sabrina Long a 80 y.o.femalewith medical history significant of advanced Alzheimer's, bedbound, chronic left hip wound with wound VAC, regular metabolic derangements, hypertension, renal artery stenosis, seizures.  Patient lives at home with her son. Patient is bedbound and minimally communicative due to advanced dementia. Left hip chronic wound with wound VAC placed. Per son patient seemed more lethargic this morning when he checked on her. Patient refused to eat or drink and would spit up water after sipping it from her drink. At times patient was seem to become more alert and make eye contact. Denies any recent fevers, chills, nausea, vomiting, diarrhea, change in urine output the patient does have a chronic Foley, diarrhea. Patient is seen at the wound clinic once a week for dressing changes and wound VAC check.  Subjective: Patient continues be withdrawn, no meaningful movements. Sodium continues to improve. Good UOP  Assessment & Plan: Acute encephalopathy: - No family at bedside today, but patient seem the same as she was yesterday.  combination of worsening of baseline advanced dementia, metabolic and infectious.  UA grossly abnormal the patient has a chronic indwelling Foley catheter.  At baseline patient is noncommunicative.  Treatment of electrolyte imbalance and UTI as below Consider DC Namenda time of discharge from the hospitalist there is likely little to no benefit of this medicine at this patient's stage of cognitive decline.  Hypernatremia, Hypokalemia  Initial Sodium 171,AKI  likely secondary to severe dehydration from poor fluid oral intake.  has had admissions for the same in the past.  Continue fluids to D5 +KCL decrease rate to 50 cc/hr patient now with trace edema.  Monitor for signs of fluid overload    BMP every 12 hrs   UTI 2/2 to foley cath: Patient with chronic indwelling Foley. UA grossly abnormal. - Urin cx grow MRSA change foley D/c Ceftriaxone Continue Vanco     F/u blood cultures   AKI: Currently 1.5, BUN 32, Baseline 0.72 and 23 respectively. Secondary to dehydration. - improving with IVF Continue IVF as above   Social: Per report patient is cared for by her son at home. There is concern for caregiver overburdened given patient's apparent condition of dehydration and chronic left hip wound and foul body odor and urine stained smell. Additional concern for patient's quality of life. Palliative care consult for goals of care and possible hospice  SNF   Severe protein calorie malnutrition and physical deconditioning: Bedbound. Patient cachectic appearing anticipate dehydration and malnutrition likely secondary to advanced dementia and oral aversion. PT/OT/nutrition/pre-albumin Continue home feeding supplement  Seizure disorder: No recent reported seizures Continue Keppra Ativan PRN  Hypertension - stable  Antihypertensive meds where hold at the time of admission  Will resume metoprolol  Hold losatan given increase in Cr  DVT prophylaxis: Lovenox Code Status: DNR Family Communication: Family at bedside Disposition Plan: SNF  Consultants:   None  Antimicrobials:  Ceftriaxone   Vanco    Objective: Vitals:   10/15/16 0500 10/15/16 0503 10/15/16 0812 10/15/16 1224  BP:  (!) 143/70  (!) 144/72  Pulse:  92  94  Resp:  18  18  Temp:  98.8 F (37.1 C)  99.7 F (37.6 C)  TempSrc:  Oral  Oral  SpO2:  100%  100%  Weight: 50.7 kg (111 lb 12.8 oz)  51 kg (112 lb 6.4 oz)  Height:        Intake/Output Summary (Last 24 hours) at 10/15/16 1656 Last data filed at 10/15/16 1600  Gross per 24 hour  Intake          2721.67 ml  Output              400 ml  Net          2321.67 ml   Filed Weights   10/14/16 0400 10/15/16 0500 10/15/16 0812  Weight: 84.8 kg  (187 lb) 50.7 kg (111 lb 12.8 oz) 51 kg (112 lb 6.4 oz)    Examination:  General exam: Open eyes with sternal rub  Respiratory system: Very difficult to auscultate, on O2, no anterior abnormal sounds.  Cardiovascular system: S1S2 RRR  Gastrointestinal system: Soft NTND  Central nervous system: Alert and oriented.  Extremities: trace pedal edema.  Data Reviewed: I have personally reviewed following labs and imaging studies  CBC:  Recent Labs Lab 10/12/16 1300 10/13/16 0448 10/14/16 0251 10/15/16 0524  WBC 14.9* 15.4* 13.1* 13.5*  NEUTROABS 10.4*  --   --  8.2*  HGB 10.4* 8.8* 8.0* 8.1*  HCT 34.0* 30.0* 27.7* 27.6*  MCV 91.9 93.2 92.6 92.3  PLT 366 345 317 295   Basic Metabolic Panel:  Recent Labs Lab 10/12/16 1757  10/13/16 2114 10/14/16 0251 10/14/16 0839 10/14/16 1822 10/15/16 0524  NA 170*  < > 168* 167* 166* 160* 165*  K 3.4*  < > 3.1* 3.0* 3.1* 3.0* 3.1*  CL >130*  < > >130* >130* >130* 129* >130*  CO2 24  < > 20* 21* 21* 21* 21*  GLUCOSE 154*  < > 174* 173* 182* 157* 158*  BUN 49*  < > 48* 45* 42* 38* 32*  CREATININE 1.48*  < > 1.85* 1.80* 1.60* 1.76* 1.52*  CALCIUM 9.4  < > 8.7* 8.6* 8.9 8.4* 8.8*  MG 2.6*  --   --   --   --   --   --   PHOS 3.0  --   --   --   --   --   --   < > = values in this interval not displayed. GFR: Estimated Creatinine Clearance: 24.6 mL/min (by C-G formula based on SCr of 1.52 mg/dL (H)). Liver Function Tests:  Recent Labs Lab 10/12/16 1300  AST 24  ALT 49  ALKPHOS 77  BILITOT 0.3  PROT 9.2*  ALBUMIN 2.6*   No results for input(s): LIPASE, AMYLASE in the last 168 hours. No results for input(s): AMMONIA in the last 168 hours. Coagulation Profile: No results for input(s): INR, PROTIME in the last 168 hours. Cardiac Enzymes: No results for input(s): CKTOTAL, CKMB, CKMBINDEX, TROPONINI in the last 168 hours. BNP (last 3 results) No results for input(s): PROBNP in the last 8760 hours. HbA1C: No results for input(s):  HGBA1C in the last 72 hours. CBG: No results for input(s): GLUCAP in the last 168 hours. Lipid Profile: No results for input(s): CHOL, HDL, LDLCALC, TRIG, CHOLHDL, LDLDIRECT in the last 72 hours. Thyroid Function Tests: No results for input(s): TSH, T4TOTAL, FREET4, T3FREE, THYROIDAB in the last 72 hours. Anemia Panel: No results for input(s): VITAMINB12, FOLATE, FERRITIN, TIBC, IRON, RETICCTPCT in the last 72 hours. Sepsis Labs: No results for input(s): PROCALCITON, LATICACIDVEN in the last 168 hours.  Recent Results (from the past 240 hour(s))  Urine culture     Status: Abnormal   Collection Time: 10/12/16  2:29 PM  Result Value Ref  Range Status   Specimen Description URINE, RANDOM  Final   Special Requests NONE  Final   Culture (A)  Final    >=100,000 COLONIES/mL METHICILLIN RESISTANT STAPHYLOCOCCUS AUREUS   Report Status 10/15/2016 FINAL  Final   Organism ID, Bacteria METHICILLIN RESISTANT STAPHYLOCOCCUS AUREUS (A)  Final      Susceptibility   Methicillin resistant staphylococcus aureus - MIC*    CIPROFLOXACIN >=8 RESISTANT Resistant     GENTAMICIN <=0.5 SENSITIVE Sensitive     NITROFURANTOIN <=16 SENSITIVE Sensitive     OXACILLIN >=4 RESISTANT Resistant     TETRACYCLINE <=1 SENSITIVE Sensitive     VANCOMYCIN 1 SENSITIVE Sensitive     TRIMETH/SULFA >=320 RESISTANT Resistant     CLINDAMYCIN <=0.25 SENSITIVE Sensitive     RIFAMPIN <=0.5 SENSITIVE Sensitive     Inducible Clindamycin NEGATIVE Sensitive     * >=100,000 COLONIES/mL METHICILLIN RESISTANT STAPHYLOCOCCUS AUREUS  Culture, blood (Routine X 2) w Reflex to ID Panel     Status: None (Preliminary result)   Collection Time: 10/14/16  6:15 PM  Result Value Ref Range Status   Specimen Description BLOOD LEFT ARM  Final   Special Requests BOTTLES DRAWN AEROBIC AND ANAEROBIC 5CC EA  Final   Culture NO GROWTH < 12 HOURS  Final   Report Status PENDING  Incomplete  Culture, blood (Routine X 2) w Reflex to ID Panel     Status:  None (Preliminary result)   Collection Time: 10/14/16  6:22 PM  Result Value Ref Range Status   Specimen Description BLOOD LEFT HAND  Final   Special Requests   Final    BOTTLES DRAWN AEROBIC AND ANAEROBIC 10CC BLUE 5CC RED   Culture PENDING  Incomplete   Report Status PENDING  Incomplete     Radiology Studies: No results found.   Scheduled Meds: . antiseptic oral rinse  7 mL Mouth Rinse BID  . enoxaparin (LOVENOX) injection  30 mg Subcutaneous Q24H  . feeding supplement (ENSURE ENLIVE)  237 mL Oral TID BM  . feeding supplement (PRO-STAT 64)  30 mL Oral TID WC  . levETIRAcetam  500 mg Intravenous Q12H  . sodium chloride flush  3 mL Intravenous Q12H  . vancomycin  500 mg Intravenous Q24H   Continuous Infusions: . dextrose 5 % with kcl 50 mL/hr at 10/15/16 1208     LOS: 3 days    Latrelle DodrillEdwin Silva, MD [facility] Pager [phone]  If 7PM-7AM, please contact night-coverage [email or web address] Password [email or web address] 10/15/2016, 4:56 PM

## 2016-10-15 NOTE — Progress Notes (Signed)
Pharmacy Antibiotic Note  Sabrina Long is a 80 y.o. female admitted on 10/12/2016  With hyponatremia, mental status changes, treated empirically this admission for history of E Coli UTI with ceftriaxone. Pt with chronic foley. Now patient with S Aureus in urine (susceptibilities pending). Pharmacy has been consulted for vancomycin dosing. Pt with improved WBC, afebrile, SCr ~1.52 (improved). UOP 0.8 ml/kg/hr  Note - patient's weight was documented incorrectly originally on 10/14/16 at 0400 (wt of 187 lbs) and vanc was originally dosed on this weight and CrCl ~25. Patient's weight has now been updated x2 and is actually ~110 lbs. Loading dose given on 10/14/16 = 1500 mg and is only ~250 mg greater than max recommended loading dose of ~25 mg/kg. First maintenance dose will be given tonight at 1900.  Plan: -change vanc maintenance dose to 500 mg IV q24h -watch SCr, UOP closely -check VT at steady state -F/u LOT and susceptibility data as able -monitor s/sx clinical improvement, TMax, CBC   Height: 5\' 3"  (160 cm) Weight: 112 lb 6.4 oz (51 kg) IBW/kg (Calculated) : 52.4  Temp (24hrs), Avg:98.8 F (37.1 C), Min:98.8 F (37.1 C), Max:98.8 F (37.1 C)   Recent Labs Lab 10/12/16 1300  10/13/16 0448  10/13/16 2114 10/14/16 0251 10/14/16 0839 10/14/16 1822 10/15/16 0524  WBC 14.9*  --  15.4*  --   --  13.1*  --   --  13.5*  CREATININE 1.52*  < > 1.73*  < > 1.85* 1.80* 1.60* 1.76* 1.52*  < > = values in this interval not displayed.  Estimated Creatinine Clearance: 24.6 mL/min (by C-G formula based on SCr of 1.52 mg/dL (H)).    Allergies  Allergen Reactions  . Risperdal [Risperidone] Other (See Comments)    sedation    Antimicrobials this admission: 12/21 Ceftriaxone >> 12/23 12/23 Vancomycin >>   Dose adjustments this admission: none  Microbiology results: 12/23 BCx: IP 12/23 UCx: S. aureus   Thank you for allowing pharmacy to be a part of this patient's  care.  Allena Katzaroline E Dacey Milberger, Pharm.D. PGY1 Pharmacy Resident 12/24/20178:32 AM Pager [phone]

## 2016-10-15 NOTE — Progress Notes (Signed)
CRITICAL VALUE ALERT  Critical value received:  Na+ 166 Chloride >130  Date of notification:  10/15/16  Time of notification:  1745  Critical value read back:Yes.    Nurse who received alert:  Madelin RearLonnie Jafari Mckillop  MD notified (1st page):  Edward JollySilva  Time of first page:  1745  MD notified (2nd page):  Time of second page:  Responding MD:  Edward JollySilva  Time MD responded:  [phone]

## 2016-10-16 LAB — BASIC METABOLIC PANEL
BUN: 26 mg/dL — ABNORMAL HIGH (ref 6–20)
CO2: 23 mmol/L (ref 22–32)
Calcium: 9.2 mg/dL (ref 8.9–10.3)
Creatinine, Ser: 1.73 mg/dL — ABNORMAL HIGH (ref 0.44–1.00)
GFR calc Af Amer: 31 mL/min — ABNORMAL LOW (ref >60)
GFR calc non Af Amer: 27 mL/min — ABNORMAL LOW (ref >60)
Glucose, Bld: 196 mg/dL — ABNORMAL HIGH (ref 65–99)
POTASSIUM: 3.3 mmol/L — AB (ref 3.5–5.1)
SODIUM: 166 mmol/L — AB (ref 135–145)

## 2016-10-16 MED ORDER — KCL IN DEXTROSE-NACL 20-5-0.9 MEQ/L-%-% IV SOLN
INTRAVENOUS | Status: DC
  Administered 2016-10-16: 16:00:00 via INTRAVENOUS
  Administered 2016-10-17 (×2): 1000 mL via INTRAVENOUS
  Administered 2016-10-17: 06:00:00 via INTRAVENOUS
  Filled 2016-10-16 (×5): qty 1000

## 2016-10-16 NOTE — Progress Notes (Signed)
PROGRESS NOTE [facility]   Sabrina Long   RUE:[id number]:[id number] DOB: Aug 04, 1936  DOA: 10/12/2016 PCP: Bufford SpikesEED, [name], DO   Brief Narrative:  Sabrina Long a 80 y.o.femalewith medical history significant of advanced Alzheimer's, bedbound, chronic left hip wound with wound VAC, regular metabolic derangements, hypertension, renal artery stenosis, seizures.  Patient lives at home with her son. Patient is bedbound and minimally communicative due to advanced dementia. Left hip chronic wound with wound VAC placed. Per son patient seemed more lethargic this morning when he checked on her. Patient refused to eat or drink and would spit up water after sipping it from her drink. At times patient was seem to become more alert and make eye contact. Denies any recent fevers, chills, nausea, vomiting, diarrhea, change in urine output the patient does have a chronic Foley, diarrhea. Patient is seen at the wound clinic once a week for dressing changes and wound VAC check.  Subjective: Today patient more awake, not following commands.   Assessment & Plan: Acute encephalopathy: No improvement, Poor prognosis, patient unlikely to get close to baseline any time soon.  Need discuss with palliative team. Patient may benefit from hospice  Combination of worsening of baseline advanced dementia, metabolic and infectious.  UA grossly abnormal the patient has a chronic indwelling Foley catheter.  At baseline patient is noncommunicative.  Continue treatment electrolyte imbalance and UTI as below Consider DC Namenda time of discharge from the hospitalist there is likely little to no benefit of this medicine at this patient's stage of cognitive decline.  Hypernatremia, Hypokalemia - suspect that hypernatremia is chronic, Water deficit about 4 L  Initial Sodium 171 likely secondary to severe dehydration from poor fluid oral intake.  has had admissions for the same in the past.  Continue fluids to  D5 +KCL will increase rate to 75 cc/hr  Monitor for signs of fluid overload  BMP in AM   UTI 2/2 to foley cath: Patient with chronic indwelling Foley. UA grossly abnormal. - Urin cx grow MRSA change foley D/c Ceftriaxone Continue Vanco     F/u blood cultures - so far no growth   AKI: Currently 1.5, BUN 32, Baseline 0.72 and 23 respectively. Secondary to dehydration. - improving with IVF Continue IVF as above   Social: Per report patient is cared for by her son at home. There is concern for caregiver overburdened given patient's apparent condition of dehydration and chronic left hip wound and foul body odor and urine stained smell. Additional concern for patient's quality of life. Palliative care consult for goals of care and possible hospice  SNF   Severe protein calorie malnutrition and physical deconditioning: Bedbound. Patient cachectic appearing anticipate dehydration and malnutrition likely secondary to advanced dementia and oral aversion. PT/OT/nutrition/pre-albumin Continue home feeding supplement  Seizure disorder: No recent reported seizures Continue Keppra Ativan PRN  Hypertension - stable  Antihypertensive meds where hold at the time of admission  Will resume metoprolol  Hold losatan given increase in Cr  DVT prophylaxis: Lovenox Code Status: DNR Family Communication: Family at bedside Disposition Plan: SNF, patient may benefit from hospice vs palliative care  Consultants:   None  Antimicrobials:  Ceftriaxone   Vanco    Objective: Vitals:   10/15/16 0812 10/15/16 1224 10/15/16 2254 10/16/16 0520  BP:  (!) 144/72 126/74 136/69  Pulse:  94 (!) 114 (!) 103  Resp:  18 16 16   Temp:  99.7 F (37.6 C) (!) 100.9 F (38.3 C) 99.6 F (37.6 C)  TempSrc:  Oral Axillary Axillary  SpO2:  100% 100% 100%  Weight: 51 kg (112 lb 6.4 oz)     Height:        Intake/Output Summary (Last 24 hours) at 10/16/16 1446 Last data filed at 10/16/16 0600  Gross per  24 hour  Intake          1271.67 ml  Output             1250 ml  Net            21.67 ml   Filed Weights   10/14/16 0400 10/15/16 0500 10/15/16 0812  Weight: 84.8 kg (187 lb) 50.7 kg (111 lb 12.8 oz) 51 kg (112 lb 6.4 oz)    Examination:  General exam: spontaneous eye opening  Respiratory system: CTA no wheezing or rales  Cardiovascular system: S1S2 RRR  Gastrointestinal system: Soft NTND  Extremities: trace pedal edema.  Data Reviewed: I have personally reviewed following labs and imaging studies  CBC:  Recent Labs Lab 10/12/16 1300 10/13/16 0448 10/14/16 0251 10/15/16 0524  WBC 14.9* 15.4* 13.1* 13.5*  NEUTROABS 10.4*  --   --  8.2*  HGB 10.4* 8.8* 8.0* 8.1*  HCT 34.0* 30.0* 27.7* 27.6*  MCV 91.9 93.2 92.6 92.3  PLT 366 345 317 295   Basic Metabolic Panel:  Recent Labs Lab 10/12/16 1757  10/14/16 0839 10/14/16 1822 10/15/16 0524 10/15/16 1654 10/16/16 0504  NA 170*  < > 166* 160* 165* 166* 166*  K 3.4*  < > 3.1* 3.0* 3.1* 5.8* 3.3*  CL >130*  < > >130* 129* >130* >130* >130*  CO2 24  < > 21* 21* 21* 19* 23  GLUCOSE 154*  < > 182* 157* 158* 149* 196*  BUN 49*  < > 42* 38* 32* 32* 26*  CREATININE 1.48*  < > 1.60* 1.76* 1.52* 1.62* 1.73*  CALCIUM 9.4  < > 8.9 8.4* 8.8* 9.0 9.2  MG 2.6*  --   --   --   --   --   --   PHOS 3.0  --   --   --   --   --   --   < > = values in this interval not displayed. GFR: Estimated Creatinine Clearance: 21.6 mL/min (by C-G formula based on SCr of 1.73 mg/dL (H)). Liver Function Tests:  Recent Labs Lab 10/12/16 1300  AST 24  ALT 49  ALKPHOS 77  BILITOT 0.3  PROT 9.2*  ALBUMIN 2.6*   No results for input(s): LIPASE, AMYLASE in the last 168 hours. No results for input(s): AMMONIA in the last 168 hours. Coagulation Profile: No results for input(s): INR, PROTIME in the last 168 hours. Cardiac Enzymes: No results for input(s): CKTOTAL, CKMB, CKMBINDEX, TROPONINI in the last 168 hours. BNP (last 3 results) No  results for input(s): PROBNP in the last 8760 hours. HbA1C: No results for input(s): HGBA1C in the last 72 hours. CBG: No results for input(s): GLUCAP in the last 168 hours. Lipid Profile: No results for input(s): CHOL, HDL, LDLCALC, TRIG, CHOLHDL, LDLDIRECT in the last 72 hours. Thyroid Function Tests: No results for input(s): TSH, T4TOTAL, FREET4, T3FREE, THYROIDAB in the last 72 hours. Anemia Panel: No results for input(s): VITAMINB12, FOLATE, FERRITIN, TIBC, IRON, RETICCTPCT in the last 72 hours. Sepsis Labs: No results for input(s): PROCALCITON, LATICACIDVEN in the last 168 hours.  Recent Results (from the past 240 hour(s))  Urine culture     Status: Abnormal  Collection Time: 10/12/16  2:29 PM  Result Value Ref Range Status   Specimen Description URINE, RANDOM  Final   Special Requests NONE  Final   Culture (A)  Final    >=100,000 COLONIES/mL METHICILLIN RESISTANT STAPHYLOCOCCUS AUREUS   Report Status 10/15/2016 FINAL  Final   Organism ID, Bacteria METHICILLIN RESISTANT STAPHYLOCOCCUS AUREUS (A)  Final      Susceptibility   Methicillin resistant staphylococcus aureus - MIC*    CIPROFLOXACIN >=8 RESISTANT Resistant     GENTAMICIN <=0.5 SENSITIVE Sensitive     NITROFURANTOIN <=16 SENSITIVE Sensitive     OXACILLIN >=4 RESISTANT Resistant     TETRACYCLINE <=1 SENSITIVE Sensitive     VANCOMYCIN 1 SENSITIVE Sensitive     TRIMETH/SULFA >=320 RESISTANT Resistant     CLINDAMYCIN <=0.25 SENSITIVE Sensitive     RIFAMPIN <=0.5 SENSITIVE Sensitive     Inducible Clindamycin NEGATIVE Sensitive     * >=100,000 COLONIES/mL METHICILLIN RESISTANT STAPHYLOCOCCUS AUREUS  Culture, blood (Routine X 2) w Reflex to ID Panel     Status: None (Preliminary result)   Collection Time: 10/14/16  6:15 PM  Result Value Ref Range Status   Specimen Description BLOOD LEFT ARM  Final   Special Requests BOTTLES DRAWN AEROBIC AND ANAEROBIC 5CC EA  Final   Culture NO GROWTH < 12 HOURS  Final   Report  Status PENDING  Incomplete  Culture, blood (Routine X 2) w Reflex to ID Panel     Status: None (Preliminary result)   Collection Time: 10/14/16  6:22 PM  Result Value Ref Range Status   Specimen Description BLOOD LEFT HAND  Final   Special Requests   Final    BOTTLES DRAWN AEROBIC AND ANAEROBIC 10CC BLUE 5CC RED   Culture NO GROWTH < 24 HOURS  Final   Report Status PENDING  Incomplete     Radiology Studies: No results found.   Scheduled Meds: . antiseptic oral rinse  7 mL Mouth Rinse BID  . enoxaparin (LOVENOX) injection  30 mg Subcutaneous Q24H  . feeding supplement (ENSURE ENLIVE)  237 mL Oral TID BM  . feeding supplement (PRO-STAT 64)  30 mL Oral TID WC  . levETIRAcetam  500 mg Intravenous Q12H  . sodium chloride flush  3 mL Intravenous Q12H  . vancomycin  500 mg Intravenous Q24H   Continuous Infusions: . dextrose 50 mL/hr at 10/15/16 1757     LOS: 4 days    Latrelle DodrillEdwin Silva, MD [facility] Pager [phone]  If 7PM-7AM, please contact night-coverage [email or web address] Password [email or web address] 10/16/2016, 2:46 PM

## 2016-10-16 NOTE — Progress Notes (Signed)
Patients L hand IV appears to be swollen and cool to touch, even after elevating on pillow.  Pt is now on fluids with K, which is incompatable with Keppra.  Two new IVs were placed by IV team.

## 2016-10-16 NOTE — Progress Notes (Signed)
CRITICAL VALUE ALERT  Critical value received: Na 166 and Chloride >130  Date of notification:  10/16/2016  Time of notification:  0546  Critical value read back:Yes.    Nurse who received alert:  Sue LushKaelin Romesberg  MD notified (1st page):  Myles Rosenthal. Langeland  Time of first page:  [phone]  MD notified (2nd page):  Time of second page:  Responding MD:  Myles Rosenthal. Langeland  Time MD responded:  [phone]

## 2016-10-17 LAB — BASIC METABOLIC PANEL
BUN: 29 mg/dL — ABNORMAL HIGH (ref 6–20)
CALCIUM: 8.9 mg/dL (ref 8.9–10.3)
CO2: 20 mmol/L — ABNORMAL LOW (ref 22–32)
CREATININE: 1.81 mg/dL — AB (ref 0.44–1.00)
Chloride: >130 mmol/L (ref 101–111)
GFR calc Af Amer: 30 mL/min — ABNORMAL LOW (ref >60)
GFR, EST NON AFRICAN AMERICAN: 26 mL/min — AB (ref >60)
GLUCOSE: 202 mg/dL — AB (ref 65–99)
Potassium: 3.4 mmol/L — ABNORMAL LOW (ref 3.5–5.1)
Sodium: 167 mmol/L (ref 135–145)

## 2016-10-17 LAB — CBC WITH DIFFERENTIAL/PLATELET
BASOS ABS: 0 10*3/uL (ref 0.0–0.1)
Basophils Relative: 0 %
EOS ABS: 0.2 10*3/uL (ref 0.0–0.7)
Eosinophils Relative: 1 %
HCT: 33.2 % — ABNORMAL LOW (ref 36.0–46.0)
HEMOGLOBIN: 9.3 g/dL — AB (ref 12.0–15.0)
LYMPHS ABS: 3 10*3/uL (ref 0.7–4.0)
LYMPHS PCT: 18 %
MCH: 27.2 pg (ref 26.0–34.0)
MCHC: 28 g/dL — AB (ref 30.0–36.0)
MCV: 97.1 fL (ref 78.0–100.0)
MONO ABS: 0.7 10*3/uL (ref 0.1–1.0)
Monocytes Relative: 4 %
NEUTROS ABS: 12.6 10*3/uL — AB (ref 1.7–7.7)
Neutrophils Relative %: 77 %
Platelets: 241 10*3/uL (ref 150–400)
RBC: 3.42 MIL/uL — ABNORMAL LOW (ref 3.87–5.11)
RDW: 17.9 % — ABNORMAL HIGH (ref 11.5–15.5)
WBC: 16.5 10*3/uL — ABNORMAL HIGH (ref 4.0–10.5)

## 2016-10-17 LAB — VANCOMYCIN, TROUGH: VANCOMYCIN TR: 20 ug/mL (ref 15–20)

## 2016-10-17 LAB — GLUCOSE, CAPILLARY: GLUCOSE-CAPILLARY: 158 mg/dL — AB (ref 65–99)

## 2016-10-17 MED ORDER — VANCOMYCIN HCL IN DEXTROSE 1-5 GM/200ML-% IV SOLN
1000.0000 mg | INTRAVENOUS | Status: DC
  Administered 2016-10-18: 1000 mg via INTRAVENOUS
  Filled 2016-10-17: qty 200

## 2016-10-17 NOTE — Progress Notes (Signed)
Speech Language Pathology Treatment: Dysphagia  Patient Details Name: Consuello Closssterline P Eslick MRN: [id number] DOB: 09-12-1936 Today's Date: 10/17/2016 Time: [id number] SLP Time Calculation (min) (ACUTE ONLY): 15 min  Assessment / Plan / Recommendation Clinical Impression  Despite positioning pt upright, washing face, providing oral care, and then max tactile and verbal cues to elicit pt response, pt only minimally moved lips in response to stim. Did not orally manipulate liquids or ice, no attempts to swallow. No progress at this time despite minimal, possibly improved, arousal.    HPI HPI: Karee P Morrisonis a 80 y.o.femalewith medical history significant of advanced Alzheimer's, bedbound, chronic left hip wound with wound VAC, regular metabolic derangements, hypertension, renal artery stenosis, seizures. Chest x ray with chronic changes without acute abnormality      SLP Plan  Continue with current plan of care     Recommendations  Diet recommendations: NPO Medication Administration: Via alternative means                Oral Care Recommendations: Oral care QID Follow up Recommendations: 24 hour supervision/assistance Plan: Continue with current plan of care       GO               [facility] Surgery Center LLCBonnie Sharlyn Odonnel, MA CCC-SLP [phone]  Claudine MoutonDeBlois, Rachel Rison Caroline 10/17/2016, 9:10 AM

## 2016-10-17 NOTE — Progress Notes (Signed)
Pharmacy Antibiotic Note  Sabrina Long is a 80 y.o. female admitted on 10/12/2016. Pharmacy is dosing Vancomycin for MRSA UTI in the setting of a chronic foley.   The patient was noted to have fluctuating weights entered into EPIC and received a slightly higher LD. The patient is also noted to have AKI (baseline SCr <1, admit 1.52) - noted to be bedbound.  A Vancomycin trough this evening is SUPRAtherapeutic of the recommended lower goal range for UTI (VT 20 mcg/ml, goal of 10-15 mcg/ml). Will adjust to q48h dosing.   Plan: 1. Adjust Vancomycin to 1g IV every 48 hours (next dose on 12/27 @ 0800) 2. Will continue to follow renal function, culture results, and LOT plans  Height: 5\' 3"  (160 cm) Weight: 112 lb 6.4 oz (51 kg) IBW/kg (Calculated) : 52.4  Temp (24hrs), Avg:99.3 F (37.4 C), Min:98.8 F (37.1 C), Max:99.9 F (37.7 C)   Recent Labs Lab 10/12/16 1300  10/13/16 0448  10/14/16 0251  10/14/16 1822 10/15/16 0524 10/15/16 1654 10/16/16 0504 10/17/16 0533 10/17/16 1832  WBC 14.9*  --  15.4*  --  13.1*  --   --  13.5*  --   --  16.5*  --   CREATININE 1.52*  < > 1.73*  < > 1.80*  < > 1.76* 1.52* 1.62* 1.73* 1.81*  --   VANCOTROUGH  --   --   --   --   --   --   --   --   --   --   --  20  < > = values in this interval not displayed.  Estimated Creatinine Clearance: 20.6 mL/min (by C-G formula based on SCr of 1.81 mg/dL (H)).    Allergies  Allergen Reactions  . Risperdal [Risperidone] Other (See Comments)    sedation    Antimicrobials this admission: 12/21 Ceftriaxone >> 12/23 12/23 Vancomycin >>   Dose adjustments this admission: none  Microbiology results: 12/23 BCx: [id number] 12/23 UCx: MRSA  Thank you for allowing pharmacy to be a part of this patient's care.  Georgina PillionElizabeth Kabrina Long, PharmD, BCPS Clinical Pharmacist Pager: [phone] 10/17/2016 7:34 PM

## 2016-10-17 NOTE — Care Management Important Message (Signed)
Important Message  Patient Details  Name: Sabrina Long MRN: [id number] Date of Birth: Apr 27, 1936   Medicare Important Message Given:  Yes    [name] 10/17/2016, 2:14 PM

## 2016-10-17 NOTE — Progress Notes (Addendum)
PROGRESS NOTE [facility]   Sabrina Long   RUE:[id number] DOB: 14-Dec-1935  DOA: 10/12/2016 PCP: Bufford Spikes, DO   Brief Narrative:  Sabrina Rakers Morrisonis a 80 y.o.femalewith medical history significant of advanced Alzheimer's, bedbound, chronic left hip wound with wound VAC, regular metabolic derangements, hypertension, renal artery stenosis, seizures.  Sabrina Long lives at home with her son. Sabrina Long is bedbound and minimally communicative due to advanced dementia. Left hip chronic wound with wound VAC placed. Per son Sabrina Long seemed more lethargic this morning when he checked on her. Sabrina Long refused to eat or drink and would spit up water after sipping it from her drink. At times Sabrina Long was seem to become more alert and make eye contact. Denies any recent fevers, chills, nausea, vomiting, diarrhea, change in urine output the Sabrina Long does have a chronic Foley, diarrhea. Sabrina Long is seen at the wound clinic once a week for dressing changes and wound VAC check.  Subjective: Sabrina Long seen and examined. Continues to not follow commands, sodium or potasium not responding to therapy  Assessment & Plan: Acute encephalopathy: No improvement, Poor prognosis, Sabrina Long unlikely to get close to baseline any time soon.  Have discussed with family the poor prognosis for the Sabrina Long. Sabrina Long unlikely to recover. Family agrees of continue hospice care and comfort measures. The question about feeding was raised, discussed multiple options for feeding but not viable at this moment we'll not recommend PEG tube or TPN. Family agrees they're not interested on surgeries or anything that could cause potencial infections. Family will have a meeting to decide where Sabrina Long will be going to a hospice facility versus home with hospice services  Plan is to keep Sabrina Long comfortable, I have asked palliative care to see the Sabrina Long tomorrow in the morning for further recommendations. Sabrina Long has received 3 days  of Vanco Question is whether the Foley catheter will remain in place or will be removed when discharge. For now would continue with IV fluids and antibiotics Combination of worsening of baseline advanced dementia, metabolic and infectious.  UA grossly abnormal the Sabrina Long has a chronic indwelling Foley catheter.  At baseline Sabrina Long is noncommunicative.   Hypernatremia, Hypokalemia - suspect that hypernatremia is chronic, Water deficit about 4 L  See above Initial Sodium 171 likely secondary to severe dehydration from poor fluid oral intake.  has had admissions for the same in the past.  Continue fluids to D5 +KCL will increase rate to 75 cc/hr  Monitor for signs of fluid overload  BMP in AM   UTI 2/2 to foley cath: Sabrina Long with chronic indwelling Foley. UA grossly abnormal. - Urine cx MRSA Foley changed D/c Ceftriaxone Continue Vanco     F/u blood cultures - so far no growth   AKI: Currently 1.5, BUN 32, Baseline 0.72 and 23 respectively. Secondary to dehydration. - improving with IVF Continue IVF as above   Severe protein calorie malnutrition and physical deconditioning: Bedbound. Sabrina Long cachectic appearing anticipate dehydration and malnutrition likely secondary to advanced dementia and oral aversion. Sabrina Long remains nothing by mouth given mental status  Seizure disorder: No recent reported seizures Continue Keppra Ativan PRN  Hypertension - stable  Antihypertensive meds where hold at the time of admission  Will resume metoprolol  Hold losatan given increase in Cr  DVT prophylaxis: Lovenox Code Status: DNR Family Communication: Family at bedside Disposition Plan: Hospice facility versus home with hospice services  Consultants:   None  Antimicrobials:  Ceftriaxone   Vanco    Objective: Vitals:   10/16/16 1721  10/16/16 2300 10/17/16 0605 10/17/16 1421  BP: 130/62 124/63 121/63 110/62  Pulse: (!) 105 (!) 116 (!) 104 (!) 120  Resp: 18 16 16    Temp:  99.6 F (37.6 C) 99.9 F (37.7 C) 98.8 F (37.1 C) 99.3 F (37.4 C)  TempSrc: Axillary Oral Axillary Oral  SpO2: 100% 100% 100% 99%  Weight:      Height:        Intake/Output Summary (Last 24 hours) at 10/17/16 1653 Last data filed at 10/17/16 1630  Gross per 24 hour  Intake                0 ml  Output             1050 ml  Net            -1050 ml   Filed Weights   10/14/16 0400 10/15/16 0500 10/15/16 0812  Weight: 84.8 kg (187 lb) 50.7 kg (111 lb 12.8 oz) 51 kg (112 lb 6.4 oz)    Examination:  General exam: Opening eye only to painful stimuli Respiratory system: Clear to auscultation  Cardiovascular system: S1S2 RRR  Gastrointestinal system: Soft NTND  Extremities: trace pedal edema.  Data Reviewed: I have personally reviewed following labs and imaging studies  CBC:  Recent Labs Lab 10/12/16 1300 10/13/16 0448 10/14/16 0251 10/15/16 0524 10/17/16 0533  WBC 14.9* 15.4* 13.1* 13.5* 16.5*  NEUTROABS 10.4*  --   --  8.2* 12.6*  HGB 10.4* 8.8* 8.0* 8.1* 9.3*  HCT 34.0* 30.0* 27.7* 27.6* 33.2*  MCV 91.9 93.2 92.6 92.3 97.1  PLT 366 345 317 295 241   Basic Metabolic Panel:  Recent Labs Lab 10/12/16 1757  10/14/16 1822 10/15/16 0524 10/15/16 1654 10/16/16 0504 10/17/16 0533  NA 170*  < > 160* 165* 166* 166* 167*  K 3.4*  < > 3.0* 3.1* 5.8* 3.3* 3.4*  CL >130*  < > 129* >130* >130* >130* >130*  CO2 24  < > 21* 21* 19* 23 20*  GLUCOSE 154*  < > 157* 158* 149* 196* 202*  BUN 49*  < > 38* 32* 32* 26* 29*  CREATININE 1.48*  < > 1.76* 1.52* 1.62* 1.73* 1.81*  CALCIUM 9.4  < > 8.4* 8.8* 9.0 9.2 8.9  MG 2.6*  --   --   --   --   --   --   PHOS 3.0  --   --   --   --   --   --   < > = values in this interval not displayed. GFR: Estimated Creatinine Clearance: 20.6 mL/min (by C-G formula based on SCr of 1.81 mg/dL (H)). Liver Function Tests:  Recent Labs Lab 10/12/16 1300  AST 24  ALT 49  ALKPHOS 77  BILITOT 0.3  PROT 9.2*  ALBUMIN 2.6*   No results  for input(s): LIPASE, AMYLASE in the last 168 hours. No results for input(s): AMMONIA in the last 168 hours. Coagulation Profile: No results for input(s): INR, PROTIME in the last 168 hours. Cardiac Enzymes: No results for input(s): CKTOTAL, CKMB, CKMBINDEX, TROPONINI in the last 168 hours. BNP (last 3 results) No results for input(s): PROBNP in the last 8760 hours. HbA1C: No results for input(s): HGBA1C in the last 72 hours. CBG: No results for input(s): GLUCAP in the last 168 hours. Lipid Profile: No results for input(s): CHOL, HDL, LDLCALC, TRIG, CHOLHDL, LDLDIRECT in the last 72 hours. Thyroid Function Tests: No results for input(s): TSH, T4TOTAL, FREET4,  T3FREE, THYROIDAB in the last 72 hours. Anemia Panel: No results for input(s): VITAMINB12, FOLATE, FERRITIN, TIBC, IRON, RETICCTPCT in the last 72 hours. Sepsis Labs: No results for input(s): PROCALCITON, LATICACIDVEN in the last 168 hours.  Recent Results (from the past 240 hour(s))  Urine culture     Status: Abnormal   Collection Time: 10/12/16  2:29 PM  Result Value Ref Range Status   Specimen Description URINE, RANDOM  Final   Special Requests NONE  Final   Culture (A)  Final    >=100,000 COLONIES/mL METHICILLIN RESISTANT STAPHYLOCOCCUS AUREUS   Report Status 10/15/2016 FINAL  Final   Organism ID, Bacteria METHICILLIN RESISTANT STAPHYLOCOCCUS AUREUS (A)  Final      Susceptibility   Methicillin resistant staphylococcus aureus - MIC*    CIPROFLOXACIN >=8 RESISTANT Resistant     GENTAMICIN <=0.5 SENSITIVE Sensitive     NITROFURANTOIN <=16 SENSITIVE Sensitive     OXACILLIN >=4 RESISTANT Resistant     TETRACYCLINE <=1 SENSITIVE Sensitive     VANCOMYCIN 1 SENSITIVE Sensitive     TRIMETH/SULFA >=320 RESISTANT Resistant     CLINDAMYCIN <=0.25 SENSITIVE Sensitive     RIFAMPIN <=0.5 SENSITIVE Sensitive     Inducible Clindamycin NEGATIVE Sensitive     * >=100,000 COLONIES/mL METHICILLIN RESISTANT STAPHYLOCOCCUS AUREUS    Culture, blood (Routine X 2) w Reflex to ID Panel     Status: None (Preliminary result)   Collection Time: 10/14/16  6:15 PM  Result Value Ref Range Status   Specimen Description BLOOD LEFT ARM  Final   Special Requests BOTTLES DRAWN AEROBIC AND ANAEROBIC 5CC EA  Final   Culture NO GROWTH 3 DAYS  Final   Report Status PENDING  Incomplete  Culture, blood (Routine X 2) w Reflex to ID Panel     Status: None (Preliminary result)   Collection Time: 10/14/16  6:22 PM  Result Value Ref Range Status   Specimen Description BLOOD LEFT HAND  Final   Special Requests   Final    BOTTLES DRAWN AEROBIC AND ANAEROBIC 10CC BLUE 5CC RED   Culture NO GROWTH 3 DAYS  Final   Report Status PENDING  Incomplete     Radiology Studies: No results found.   Scheduled Meds: . antiseptic oral rinse  7 mL Mouth Rinse BID  . enoxaparin (LOVENOX) injection  30 mg Subcutaneous Q24H  . feeding supplement (ENSURE ENLIVE)  237 mL Oral TID BM  . feeding supplement (PRO-STAT 64)  30 mL Oral TID WC  . levETIRAcetam  500 mg Intravenous Q12H  . sodium chloride flush  3 mL Intravenous Q12H  . vancomycin  500 mg Intravenous Q24H   Continuous Infusions: . dextrose 5 % and 0.9 % NaCl with KCl 20 mEq/L 75 mL/hr at 10/17/16 0610  . dextrose Stopped (10/16/16 1536)     LOS: 5 days    Latrelle DodrillEdwin Silva, MD [facility] Pager [phone]  If 7PM-7AM, please contact night-coverage [email or web address] Password [email or web address] 10/17/2016, 4:53 PM

## 2016-10-18 DIAGNOSIS — G308 Other Alzheimer's disease: Secondary | ICD-10-CM

## 2016-10-18 DIAGNOSIS — N390 Urinary tract infection, site not specified: Secondary | ICD-10-CM

## 2016-10-18 DIAGNOSIS — E870 Hyperosmolality and hypernatremia: Secondary | ICD-10-CM

## 2016-10-18 DIAGNOSIS — N179 Acute kidney failure, unspecified: Secondary | ICD-10-CM

## 2016-10-18 DIAGNOSIS — F0280 Dementia in other diseases classified elsewhere without behavioral disturbance: Secondary | ICD-10-CM

## 2016-10-18 DIAGNOSIS — G9340 Encephalopathy, unspecified: Secondary | ICD-10-CM

## 2016-10-18 DIAGNOSIS — Z7189 Other specified counseling: Secondary | ICD-10-CM

## 2016-10-18 NOTE — Progress Notes (Signed)
[facility] RN Hospital Liaison ° °Received request from [name], CSW for family interest in [facility] with request to transfer today.  Chart reviewed and received report from [name], RN.  Met with patient's son, [name] and his wife, [name], to confirm interest and explain services.  Patient unable to participate in conversation due to mental status.  Family agreeable for patient to transfer today. Text sent to CSW.   ° °Registration paperwork completed today.  Dr. [name] to assume care, per family request.   ° °Please fax discharge summary to:  [phone].  Rn:  Please call report to:  [phone].  Please arrange transport for patient ASAP.   ° °Thank you, °[name], RN, BSN °[facility] Liaisons are now on AMION.  I can be reached before 5 today at [phone].  [facility] 24 hour # is [phone].   ° °

## 2016-10-18 NOTE — Progress Notes (Signed)
Daily Progress Note   Patient Name: Sabrina Long       Date: 10/18/2016 DOB: 05-07-1936  Age: 80 y.o. MRN#: [id number] Attending Physician: Modena Jansky, MD Primary Care Physician: Hollace Kinnier, DO Admit Date: 10/12/2016  Reason for Consultation/Follow-up: Establishing goals of care, Hospice Evaluation and Psychosocial/spiritual support  Subjective: Spoke with son Aaron Edelman and his wife at bedside.  The patient is no longer responsive and unable to take in any nutrition other than IV fluids.  Aaron Edelman and his family met last night and determined they are unable to care for their mother during her dying process at home.  They have requested the help of hospice services.  Length of Stay: 6  Current Medications: Scheduled Meds:  . antiseptic oral rinse  7 mL Mouth Rinse BID  . feeding supplement (ENSURE ENLIVE)  237 mL Oral TID BM  . feeding supplement (PRO-STAT 64)  30 mL Oral TID WC  . levETIRAcetam  500 mg Intravenous Q12H  . sodium chloride flush  3 mL Intravenous Q12H    Continuous Infusions: . dextrose 5 % and 0.9 % NaCl with KCl 20 mEq/L 1,000 mL (10/17/16 2038)    PRN Meds: LORazepam, ondansetron **OR** ondansetron (ZOFRAN) IV  Physical Exam       Cachectic elderly female.  Sleeping comfortably.  Does not wake to voice or touch. Family at bedside.  Vital Signs: BP (!) 127/58 (BP Location: Right Arm)   Pulse (!) 105   Temp 98.6 F (37 C) (Axillary)   Resp (!) 22   Ht 5' 3"  (1.6 m)   Wt 48.7 kg (107 lb 6.4 oz)   SpO2 96%   BMI 19.03 kg/m  SpO2: SpO2: 96 % O2 Device: O2 Device: Nasal Cannula O2 Flow Rate: O2 Flow Rate (L/min): 2 L/min  Intake/output summary:  Intake/Output Summary (Last 24 hours) at 10/18/16 1156 Last data filed at 10/18/16 0949  Gross per  24 hour  Intake              930 ml  Output              650 ml  Net              280 ml   LBM: Last BM Date: 10/14/16 Baseline Weight: Weight: 84.8 kg (187 lb)  Most recent weight: Weight: 48.7 kg (107 lb 6.4 oz)       Palliative Assessment/Data:    Flowsheet Rows   Flowsheet Row Most Recent Value  Intake Tab  Referral Department  Hospitalist  Unit at Time of Referral  Cardiac/Telemetry Unit  Palliative Care Primary Diagnosis  Neurology  Date Notified  10/13/16  Palliative Care Type  New Palliative care  Reason for referral  Clarify Goals of Care  Date of Admission  10/12/16  Date first seen by Palliative Care  10/13/16  # of days Palliative referral response time  0 Day(s)  # of days IP prior to Palliative referral  1  Clinical Assessment  Palliative Performance Scale Score  20%  Psychosocial & Spiritual Assessment  Palliative Care Outcomes  Patient/Family meeting held?  Yes  Who was at the meeting?  patient and son Annitta Needs  Palliative Care Outcomes  Clarified goals of care  Patient/Family wishes: Interventions discontinued/not started   Mechanical Ventilation, PEG      Patient Active Problem List   Diagnosis Date Noted  . Palliative care encounter   . Goals of care, counseling/discussion   . Acute hypernatremia 10/12/2016  . Chronic UTI 10/12/2016  . AKI (acute kidney injury) ([place]) 10/12/2016  . Leg wound, left 10/12/2016  . Dehydration   . Hypernatremia   . Foley catheter in place 07/03/2016  . Pressure ulcer 12/06/2015  . Lobar pneumonia, unspecified organism ([place]) 12/05/2015  . Sepsis due to pneumonia ([place]) 12/05/2015  . Leukocytosis 12/05/2015  . Hyperkalemia 12/05/2015  . Seizures ([employer]) 12/05/2015  . Dyslipidemia 12/05/2015  . Acute kidney injury ([place]) 12/05/2015  . Protein-calorie malnutrition, severe ([place]) 02/16/2015  . Left leg DVT ([place]) 08/17/2014  . Essential hypertension 07/29/2014  . Bilateral pulmonary embolism ([place]) 07/27/2014  .  Alzheimer's disease 04/02/2014  . Acute encephalopathy 02/17/2014  . Dementia with behavioral disturbance     Palliative Care Assessment & Plan   Patient Profile: 80 y.o. female  with past medical history of advanced dementia, stage 4 wounds, seizures, hypernatremia, who was admitted on 10/12/2016 with lethargy and severe electrolyte abnormalities (NA 170, CL >130), acute on chronic renal failure and UTI.  At baseline she is bedbound and barely verbal.  She is cared for in her son's home with round the clock CNAs.  She has long term stage 4 wounds.  She is thin and frail.  Her albumin is 2.6.   Assessment: Patient is no longer awake.  She can not take in any oral nutrition.  Hospital treatment has been unable to improve her mental status.  Recommendations/Plan:  Family requesting Hospice services at [facility].  The family lives very near [facility].  I have discontinued medications / procedures not related to comfort (DVT prophylaxis, antibiotics, telemetry, etc.)  Goals of Care and Additional Recommendations:  Limitations on Scope of Treatment: Full Comfort Care  Code Status:  DNR  Prognosis:   < 2 weeks  Discharge Planning:  Hospice facility  Care plan was discussed with attending MD, bedside RN, patient's family  Thank you for allowing the Palliative Medicine Team to assist in the care of this patient.   Time In: 11:30 Time Out: 12:05 Total Time 35 Prolonged Time Billed  no      Greater than 50%  of this time was spent counseling and coordinating care related to the above assessment and plan.  Imogene Burn, PA-C Palliative Medicine   Please contact Palliative MedicineTeam phone at [phone] for questions  and concerns.   Please see AMION for individual provider pager numbers.     ]

## 2016-10-18 NOTE — Clinical Social Work Note (Signed)
Clinical Social Work Assessment  Patient Details  Name: Sabrina Long MRN: [id number] Date of Birth: 03/06/1936  Date of referral:  10/18/16               Reason for consult:  End of Life/Hospice                Permission sought to share information with:  Facility Medical sales representativeContact Representative, Family Supports Permission granted to share information::  No  Name::     Civil Service fast streamerBrian  Agency::  Hospice  Relationship::  Son  Contact Information:  [phone]  Housing/Transportation Living arrangements for the past 2 months:  Single Family Home Source of Information:  Adult Children Patient Interpreter Needed:  None Criminal Activity/Legal Involvement Pertinent to Current Situation/Hospitalization:  No - Comment as needed Significant Relationships:  Adult Children Lives with:  Adult Children Do you feel safe going back to the place where you live?  No Need for family participation in patient care:  Yes (Comment)  Care giving concerns:  CSW received consult for possible residential hospice referral. Patient's [facility] as they are no longer able to care for her at home. Main contact is son, [name]. CSW to continue to follow and assist with discharge planning needs.   Social Worker assessment / plan:  [facility]  Employment status:  Retired Health and safety inspectornsurance information:  Medicare PT Recommendations:  Not assessed at this time Information / Referral to community resources:     Patient/Family's Response to care:  Patient's son has accepted that his mother has declined and is ready for [facility].  Patient/Family's Understanding of and Emotional Response to Diagnosis, Current Treatment, and Prognosis:  No questions or concerns at this time.   Emotional Assessment Appearance:  Appears stated age Attitude/Demeanor/Rapport:  Unable to Assess Affect (typically observed):  Unable to  Assess Orientation:    Alcohol / Substance use:  Not Applicable Psych involvement (Current and /or in the community):  No (Comment)  Discharge Needs  Concerns to be addressed:  Care Coordination Readmission within the last 30 days:  No Current discharge risk:  None Barriers to Discharge:  No Barriers Identified   Mearl Latinadia S Zaiyden Strozier, LCSWA 10/18/2016, 4:03 PM

## 2016-10-18 NOTE — Discharge Summary (Signed)
Physician Discharge Summary  Sabrina Long BJY:[id number] DOB: 1936/10/18  PCP: Bufford SpikesEED, [name], DO  Admit date: 10/12/2016 Discharge date: 10/18/2016  Recommendations for Outpatient Follow-up:  1. Patient is being discharged to residential hospice at [facility] for end-of-life care.  Home Health: None Equipment/Devices: None    Discharge Condition: Rapidly declining  CODE STATUS: DO NOT RESUSCITATE  Diet recommendation: As tolerated, comfort feeds.  Discharge Diagnoses:  Active Problems:   Acute encephalopathy   Alzheimer's disease   Essential hypertension   Protein-calorie malnutrition, severe (HCC)   Seizures (HCC)   Acute hypernatremia   Chronic UTI   AKI (acute kidney injury) (HCC)   Leg wound, left   Palliative care encounter   Goals of care, counseling/discussion   Encounter for hospice care discussion   Brief/Interim Summary:  80 y.o.femalewith past medical history of advanced dementia, stage 4 wounds, seizures, hypernatremia, who was admitted on 12/21/2017with lethargy and severe electrolyte abnormalities (NA 170, CL >130), acute on chronic renal failure and UTI. At baseline she is bedbound and barely verbal. She is cared for in her son's home with round the clock CNAs. She has long term stage 4 wounds. She is thin and frail. Her albumin is 2.6.  Assessment and plan:  Acute encephalopathy: No improvement, Poor prognosis, patient unlikely to get close to baseline. Actually seems to be declining. Colleague hospitalist discussed with family the poor prognosis for the patient. Patient unlikely to recover. Family agrees of continue hospice care and comfort measures. The question about feeding was raised, discussed multiple options for feeding but not viable at this moment we'll not recommend PEG tube or TPN. Family agrees they're not interested on surgeries or anything that could cause potencial infections. Patient's son/healthcare power of attorney states  that the entire family had extensive discussion last night and they have decided that patient should go to a residential hospice for end-of-life care. Palliative care team input appreciated and agree with current course of action. As discussed with patient and family, discontinued medications that are not essential to comfort. Also if patient is not able to take anything by mouth due to her lack of alertness, may discontinue oral feeds as well at [facility]. Etiology of her encephalopathy is due to combination of worsening of baseline advanced dementia, metabolic and infectious.  UA grossly abnormal the patient has a chronic indwelling Foley catheter.  At baseline patient is noncommunicative.   Hypernatremia, Hypokalemia - suspect that hypernatremia is chronic, Water deficit about 4 L  See above Initial Sodium 171 likely secondary to severe dehydration from poor fluid oral intake.  has had admissions for the same in the past.  No significant improvement and patient's sodium remains elevated in the 160s despite hydration. This may also be contributing to her mental status changes.  UTI 2/2 to foley cath: Patient with chronic indwelling Foley. UA grossly abnormal. - Urine cx MRSA Foley changed. Blood cultures 2: Negative to date. No antibiotics at discharge due to comfort care option.  AKI: Currently 1.5, BUN 32, Baseline 0.72 and 23 respectively. Secondary to dehydration. Creatinine has plateaued.  Severe protein calorie malnutrition and physical deconditioning: Bedbound. Patient cachectic appearing anticipate dehydration and malnutrition likely secondary to advanced dementia and oral aversion.   Seizure disorder: No recent reported seizures Continue Keppra if able to take orally otherwise may discontinue.  Hypertension - stable  All medications nonessential to comfort, discontinued at discharge.    Consultants:   Palliative care team  Discharge Instructions  Discharge  Instructions    Call MD for:  difficulty breathing, headache or visual disturbances    Complete by:  As directed    Call MD for:  extreme fatigue    Complete by:  As directed    Call MD for:  persistant dizziness or light-headedness    Complete by:  As directed    Call MD for:  persistant nausea and vomiting    Complete by:  As directed    Call MD for:  severe uncontrolled pain    Complete by:  As directed    Call MD for:  temperature >100.4    Complete by:  As directed    Diet - low sodium heart healthy    Complete by:  As directed    Increase activity slowly    Complete by:  As directed        Medication List    STOP taking these medications   alendronate 70 MG tablet Commonly known as:  FOSAMAX   AMBULATORY NON FORMULARY MEDICATION   losartan 25 MG tablet Commonly known as:  COZAAR   metoprolol tartrate 25 MG tablet Commonly known as:  LOPRESSOR   oxyCODONE 5 MG immediate release tablet Commonly known as:  Oxy IR/ROXICODONE     TAKE these medications   ENSURE PLUS Liqd Take 237 mLs by mouth 3 (three) times daily between meals.   feeding supplement (PRO-STAT 64) Liqd Take 30 mLs by mouth 3 (three) times daily with meals.   levETIRAcetam 100 MG/ML solution Commonly known as:  KEPPRA Take 5 mLs (500 mg total) by mouth 2 (two) times daily.   LORazepam 2 MG/ML concentrated solution Commonly known as:  ATIVAN Take 0.5 mLs (1 mg total) by mouth every 4 (four) hours as needed for anxiety (agitation).   morphine 10 MG/5ML solution Take 2.5 mLs (5 mg total) by mouth every 2 (two) hours as needed for moderate pain (or Shortness of Breath).       Allergies  Allergen Reactions  . Risperdal [Risperidone] Other (See Comments)    sedation      Procedures/Studies: Dg Chest 1 View  Result Date: 10/12/2016 CLINICAL DATA:  Altered mental status and weakness EXAM: CHEST 1 VIEW COMPARISON:  02/02/2016 FINDINGS: Cardiac shadow is within normal limits. Lungs are well  aerated with mild chronic changes bilaterally. No focal infiltrate or sizable effusion is seen. No bony abnormality is seen. IMPRESSION: Chronic changes without acute abnormality. Electronically Signed   By: Alcide Clever M.D.   On: 10/12/2016 13:25   Ct Head Wo Contrast  Result Date: 10/12/2016 CLINICAL DATA:  Altered mental status this morning, now resolved. History of seizure in dementia. EXAM: CT HEAD WITHOUT CONTRAST TECHNIQUE: Contiguous axial images were obtained from the base of the skull through the vertex without intravenous contrast. COMPARISON:  02/16/2015 brain MRI and 02/15/2015 CT FINDINGS: Brain: There is no evidence of acute cortical infarct, intracranial hemorrhage, mass, midline shift, or extra-axial fluid collection. Advanced cerebral atrophy and extensive chronic small vessel white matter disease are similar to the prior studies. Vascular: Calcified atherosclerosis at the skullbase. No hyperdense vessel. Skull: No fracture or focal osseous lesion. Sinuses/Orbits: Visualized paranasal sinuses and mastoid air cells are clear. No acute findings in the imaged portions of the orbits. Other: None. IMPRESSION: 1. No evidence of acute intracranial abnormality. 2. Advanced cerebral atrophy and chronic small vessel ischemic disease. Electronically Signed   By: Sebastian Ache M.D.   On: 10/12/2016 14:10  Subjective: Patient is nonverbal. Discussed with patient's son/healthcare power of attorney and his wife at bedside and they indicate that patient last opened her eyes and was watching TV approximately 24 hours ago. He indicates that they had a family meeting last night and have opted for residential hospice.   Discharge Exam:  Vitals:   10/17/16 2134 10/18/16 0410 10/18/16 0425 10/18/16 1415  BP: 113/67  (!) 127/58 124/65  Pulse: (!) 115  (!) 105 100  Resp: 18  (!) 22 15  Temp: 99 F (37.2 C)  98.6 F (37 C) 98.5 F (36.9 C)  TempSrc: Oral  Axillary Oral  SpO2: 100%  96% 100%   Weight:  48.7 kg (107 lb 6.4 oz)    Height:        General: Pleasant elderly female, small built, frail and cachectic, chronically ill-looking, lying comfortably supine in bed with her toy doll at bedside.  Cardiovascular: S1 & S2 heard, RRR, S1/S2 +. No murmurs, rubs, gallops or clicks. No JVD or pedal edema.Telemetry: Sinus tachycardia in the 110s.  Respiratory: reduced breath sounds bilaterally/poor inspiratory effort. No increased work of breathing. Abdominal:  Non distended, non tender & soft. No organomegaly or masses appreciated. Normal bowel sounds heard. CNS:  sleeping. Did not attempt to arouse.  Extremities: no edema, no cyanosis    The results of significant diagnostics from this hospitalization (including imaging, microbiology, ancillary and laboratory) are listed below for reference.     Microbiology: Recent Results (from the past 240 hour(s))  Urine culture     Status: Abnormal   Collection Time: 10/12/16  2:29 PM  Result Value Ref Range Status   Specimen Description URINE, RANDOM  Final   Special Requests NONE  Final   Culture (A)  Final    >=100,000 COLONIES/mL METHICILLIN RESISTANT STAPHYLOCOCCUS AUREUS   Report Status 10/15/2016 FINAL  Final   Organism ID, Bacteria METHICILLIN RESISTANT STAPHYLOCOCCUS AUREUS (A)  Final      Susceptibility   Methicillin resistant staphylococcus aureus - MIC*    CIPROFLOXACIN >=8 RESISTANT Resistant     GENTAMICIN <=0.5 SENSITIVE Sensitive     NITROFURANTOIN <=16 SENSITIVE Sensitive     OXACILLIN >=4 RESISTANT Resistant     TETRACYCLINE <=1 SENSITIVE Sensitive     VANCOMYCIN 1 SENSITIVE Sensitive     TRIMETH/SULFA >=320 RESISTANT Resistant     CLINDAMYCIN <=0.25 SENSITIVE Sensitive     RIFAMPIN <=0.5 SENSITIVE Sensitive     Inducible Clindamycin NEGATIVE Sensitive     * >=100,000 COLONIES/mL METHICILLIN RESISTANT STAPHYLOCOCCUS AUREUS  Culture, blood (Routine X 2) w Reflex to ID Panel     Status: None (Preliminary result)    Collection Time: 10/14/16  6:15 PM  Result Value Ref Range Status   Specimen Description BLOOD LEFT ARM  Final   Special Requests BOTTLES DRAWN AEROBIC AND ANAEROBIC 5CC EA  Final   Culture NO GROWTH 4 DAYS  Final   Report Status PENDING  Incomplete  Culture, blood (Routine X 2) w Reflex to ID Panel     Status: None (Preliminary result)   Collection Time: 10/14/16  6:22 PM  Result Value Ref Range Status   Specimen Description BLOOD LEFT HAND  Final   Special Requests   Final    BOTTLES DRAWN AEROBIC AND ANAEROBIC 10CC BLUE 5CC RED   Culture NO GROWTH 4 DAYS  Final   Report Status PENDING  Incomplete     Labs: BNP (last 3 results) No results for input(s): BNP  in the last 8760 hours. Basic Metabolic Panel:  Recent Labs Lab 10/12/16 1757  10/14/16 1822 10/15/16 0524 10/15/16 1654 10/16/16 0504 10/17/16 0533  NA 170*  < > 160* 165* 166* 166* 167*  K 3.4*  < > 3.0* 3.1* 5.8* 3.3* 3.4*  CL >130*  < > 129* >130* >130* >130* >130*  CO2 24  < > 21* 21* 19* 23 20*  GLUCOSE 154*  < > 157* 158* 149* 196* 202*  BUN 49*  < > 38* 32* 32* 26* 29*  CREATININE 1.48*  < > 1.76* 1.52* 1.62* 1.73* 1.81*  CALCIUM 9.4  < > 8.4* 8.8* 9.0 9.2 8.9  MG 2.6*  --   --   --   --   --   --   PHOS 3.0  --   --   --   --   --   --   < > = values in this interval not displayed. Liver Function Tests:  Recent Labs Lab 10/12/16 1300  AST 24  ALT 49  ALKPHOS 77  BILITOT 0.3  PROT 9.2*  ALBUMIN 2.6*   CBC:  Recent Labs Lab 10/12/16 1300 10/13/16 0448 10/14/16 0251 10/15/16 0524 10/17/16 0533  WBC 14.9* 15.4* 13.1* 13.5* 16.5*  NEUTROABS 10.4*  --   --  8.2* 12.6*  HGB 10.4* 8.8* 8.0* 8.1* 9.3*  HCT 34.0* 30.0* 27.7* 27.6* 33.2*  MCV 91.9 93.2 92.6 92.3 97.1  PLT 366 345 317 295 241   CBG:  Recent Labs Lab 10/17/16 2133  GLUCAP 158*   Urinalysis    Component Value Date/Time   COLORURINE YELLOW 10/12/2016 1429   APPEARANCEUR TURBID (A) 10/12/2016 1429   LABSPEC 1.013 10/12/2016  1429   PHURINE 8.0 10/12/2016 1429   GLUCOSEU NEGATIVE 10/12/2016 1429   HGBUR SMALL (A) 10/12/2016 1429   BILIRUBINUR NEGATIVE 10/12/2016 1429   BILIRUBINUR neg 01/22/2014 1319   KETONESUR NEGATIVE 10/12/2016 1429   PROTEINUR >=300 (A) 10/12/2016 1429   UROBILINOGEN 0.2 07/22/2015 1903   NITRITE POSITIVE (A) 10/12/2016 1429   LEUKOCYTESUR MODERATE (A) 10/12/2016 1429   Discussed in detail with patient's son/healthcare power of attorney at bedside and his wife. Updated care and answered questions.    Time coordinating discharge: Over 30 minutes  SIGNED:  Marcellus ScottHONGALGI,Jari Carollo, MD, FACP, FHM. [facility] Pager [phone]  If 7PM-7AM, please contact night-coverage [email or web address] Password [email or web address] 10/18/2016, 4:02 PM

## 2016-10-18 NOTE — Progress Notes (Signed)
Per Palliative team, CSW sent referral to [facility] for review.  Osborne Cascoadia Sabrina Long LCSWA [phone]

## 2016-10-18 NOTE — Care Management Note (Signed)
Case Management Note  Patient Details  Name: Sabrina Long MRN: [id number] Date of Birth: February 10, 1936  Subjective/Objective:                 Patient admitted with hypernatremia. From home with son who has provided patient with customized apartment in his basement and provided care. Palliative met with son Sabrina Long, plan for Residential Hospice; prefers [facility] due to proximity to home. Percell Locus CSW updated with plan.   Action/Plan:  DC to residential hospice facility as facilitated by CSW.   Expected Discharge Date:                  Expected Discharge Plan:  [place]  In-House Referral:  Clinical Social Work  Discharge planning Services  CM Consult  Post Acute Care Choice:    Choice offered to:     DME Arranged:    DME Agency:     HH Arranged:    [place] Agency:     Status of Service:  Completed, signed off  If discussed at [employer], dates discussed:    Additional Comments:  Carles Collet, RN 10/18/2016, 12:09 PM

## 2016-10-18 NOTE — Progress Notes (Signed)
SLP Cancellation Note  Patient Details Name: Sabrina Long MRN: [id number] DOB: Jun 25, 1936   Cancelled treatment:       Reason Eval/Treat Not Completed: Medical issues which prohibited therapy. Per chart, RN patient no longer alert, accepting only IV nutrition. Has been made comfort care. SLP will sign off.  Rondel BatonMary Beth Ladashia Demarinis, [place] CF-SLP Speech-Language Pathologist  Arlana LindauMary E Dasiah Hooley 10/18/2016, 4:35 PM

## 2016-10-18 NOTE — Progress Notes (Signed)
Patient will DC to: [facility] Anticipated DC date: 10/18/16 Family notified: Son Transport by: Sharin MonsPTAR   Per MD patient ready [facility]. RN, patient, patient's family, and facility notified of DC. Discharge Summary sent to facility. RN given number for report. DC packet on chart. Ambulance transport requested for patient.   CSW signing off.  Cristobal GoldmannNadia Saathvik Every, [place] Clinical Social Worker [phone]

## 2016-10-18 NOTE — Progress Notes (Signed)
Nutrition Brief Note  Chart reviewed. Pt now transitioning to comfort care.  No further nutrition interventions warranted at this time.  Please re-consult as needed.   Shaurya Rawdon A. Tyron Manetta, RD, LDN, CDE Pager: [phone] After hours Pager: [phone]  

## 2016-10-19 LAB — CULTURE, BLOOD (ROUTINE X 2)
CULTURE: NO GROWTH
CULTURE: NO GROWTH

## 2016-10-20 ENCOUNTER — Telehealth: Payer: Self-pay

## 2016-10-23 NOTE — Telephone Encounter (Signed)
I wanted to make you aware. This is a patient of [facility], who was admitted to [facility] after hospitalization for end of life care. Discharged from [facility] on 10/18/16.

## 2016-10-23 NOTE — Telephone Encounter (Signed)
Noted.  I saw the notes.  Thanks.

## 2016-10-23 DEATH — deceased

## 2016-10-26 ENCOUNTER — Ambulatory Visit: Payer: Medicare Other | Admitting: Internal Medicine

## 2017-03-28 IMAGING — CR DG CHEST 1V PORT
1 series · 1 of 1 positions shown · non-contrast
Comparison: CT chest 07/27/2014.  PA and lateral chest 05/03/2013.

CLINICAL DATA: Seizures.

EXAM:
PORTABLE CHEST - 1 VIEW

[AP]
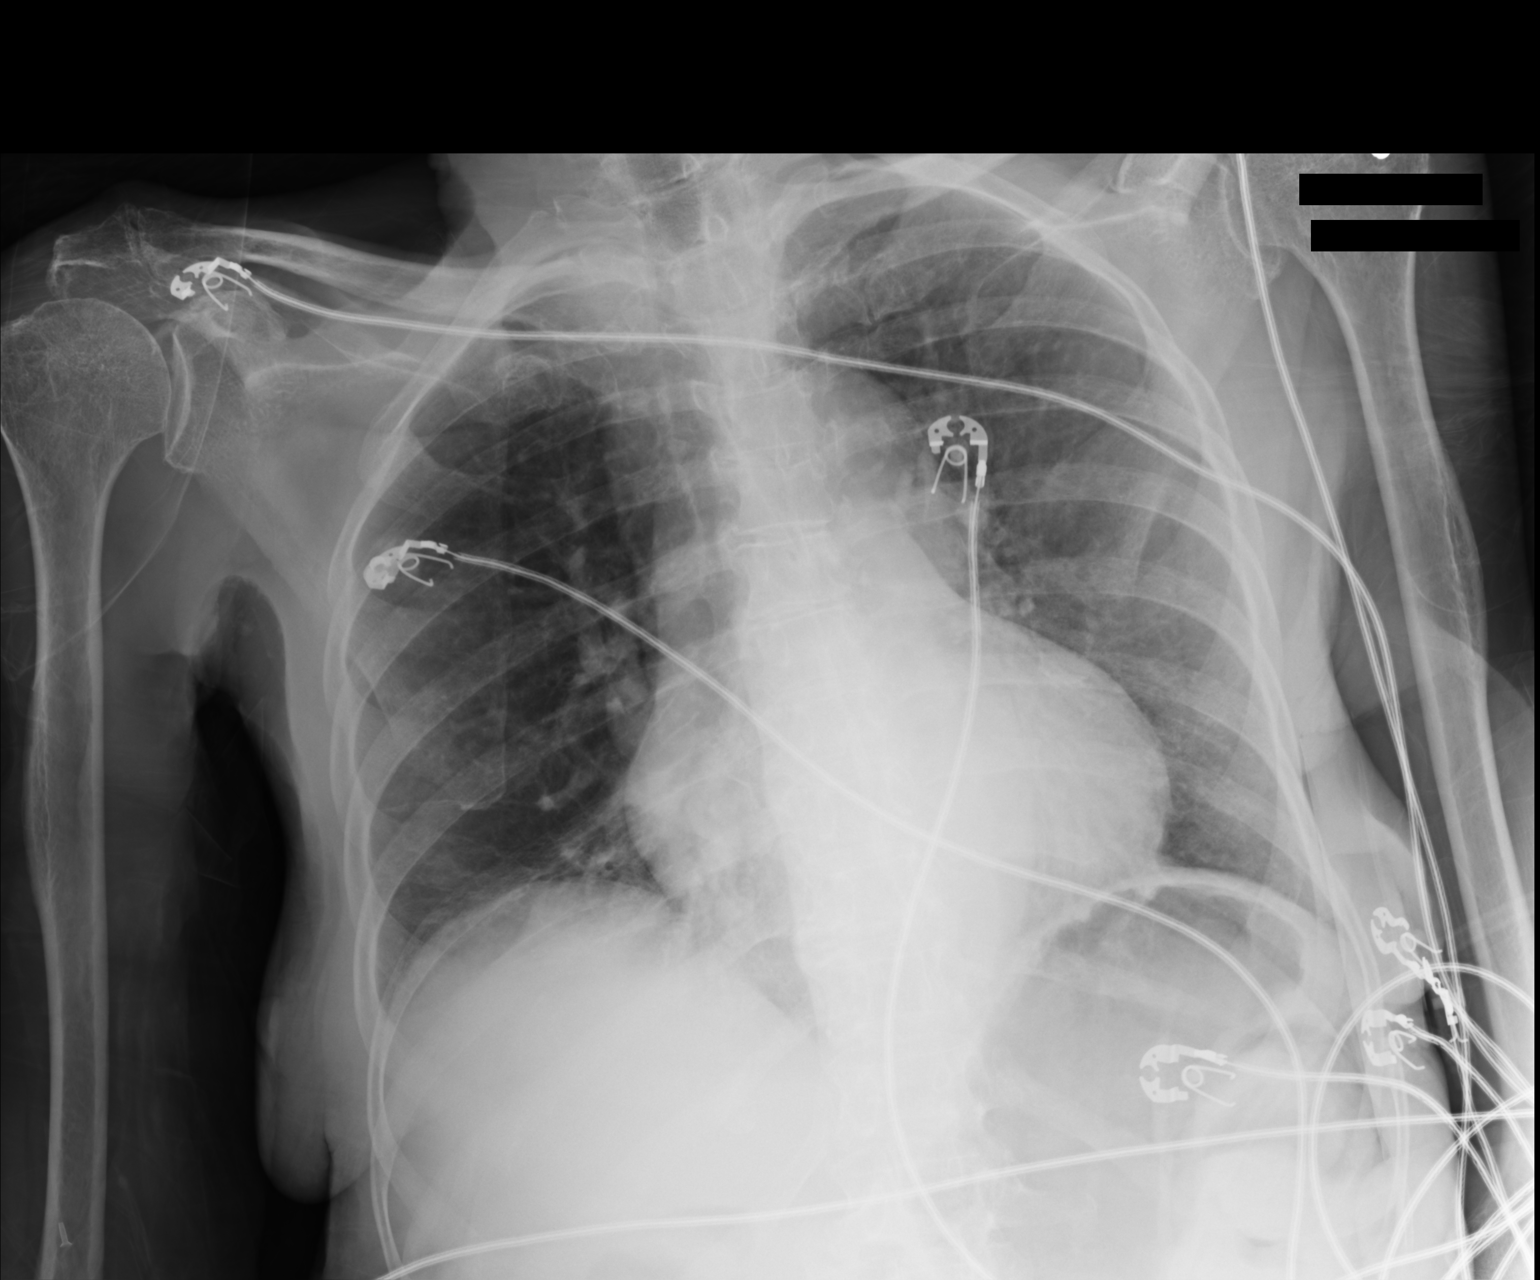

[1 of 1 positions shown; findings below may reference images not displayed]

FINDINGS: The lungs are clear. Heart size is upper normal. No pneumothorax or
pleural effusion.
IMPRESSION: No acute disease.

## 2017-10-21 IMAGING — CR DG HAND COMPLETE 3+V*L*
3 series · 3 of 3 positions shown · non-contrast
Comparison: None.

CLINICAL DATA: Soft tissue injury at the base of the thumb 3 days
ago.

EXAM:
LEFT HAND - COMPLETE 3+ VIEW

[x hand ap left]
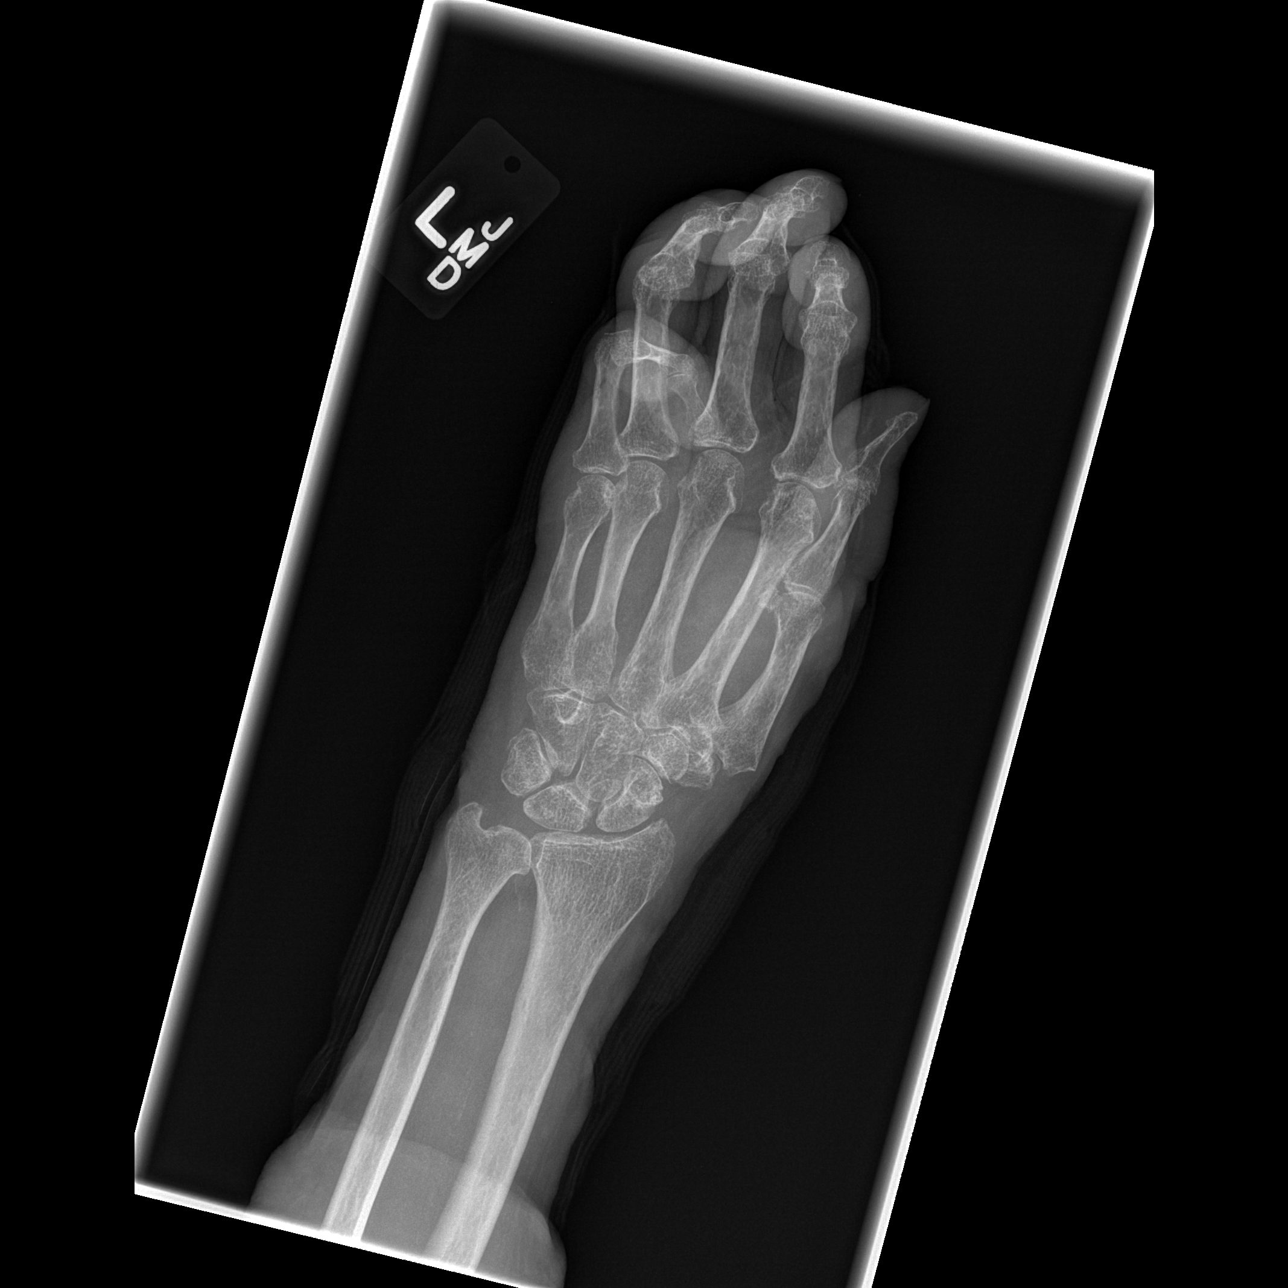

[x hand oblique left]
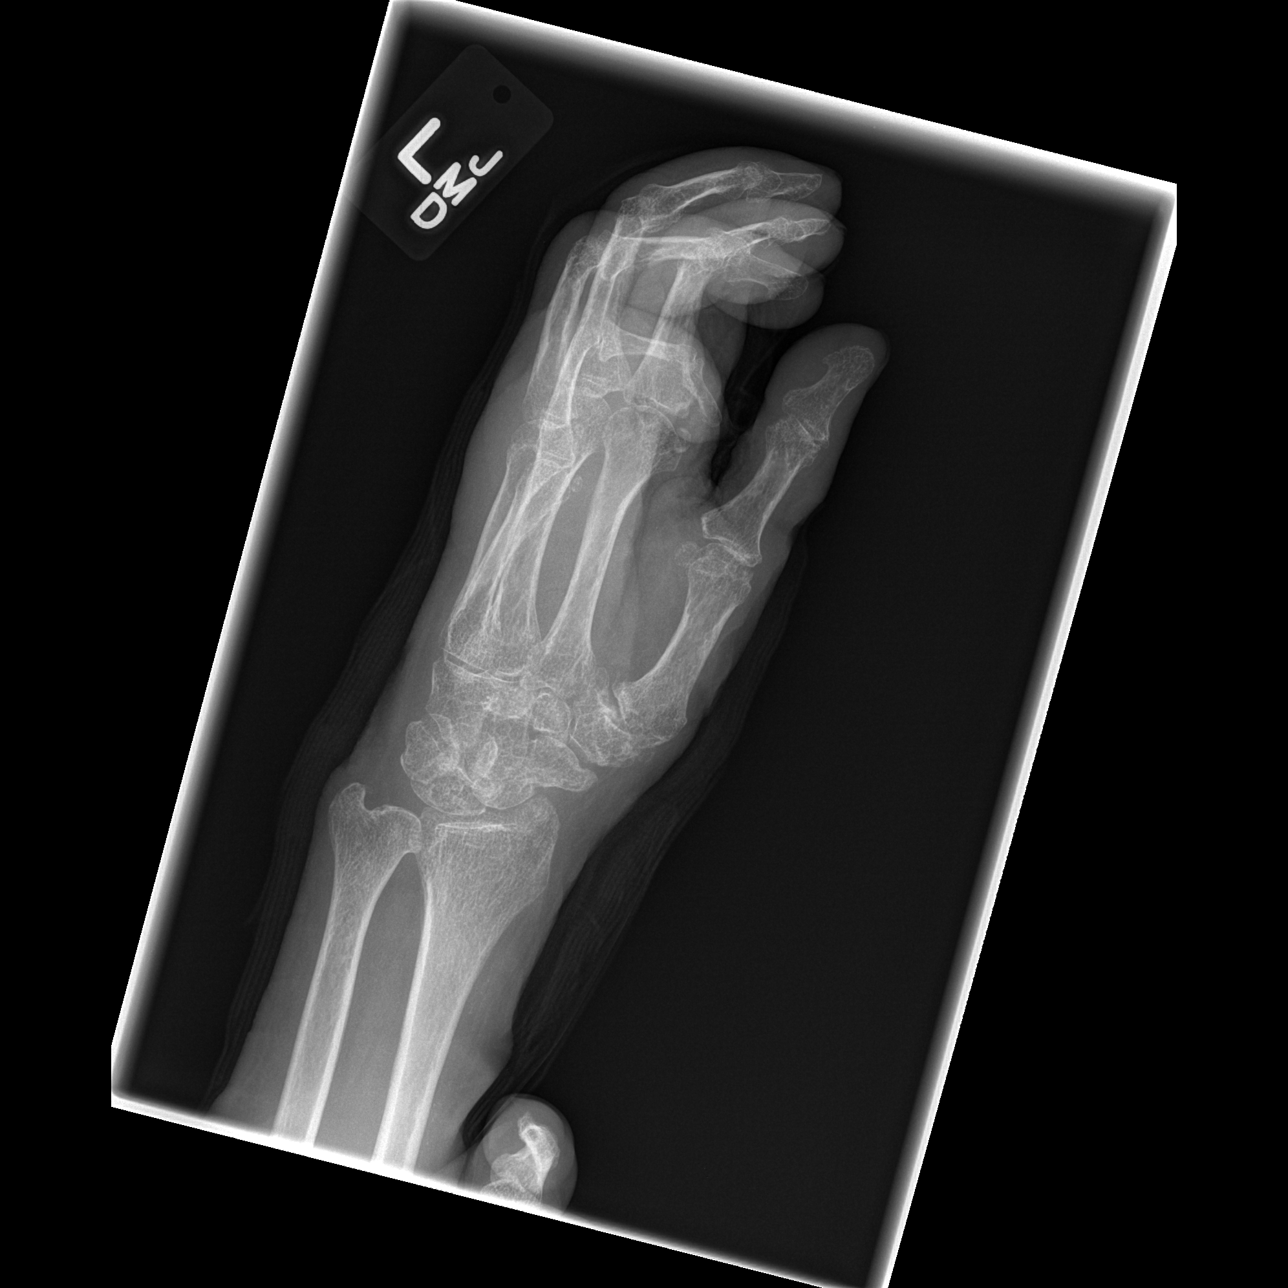

[x hand lat left]
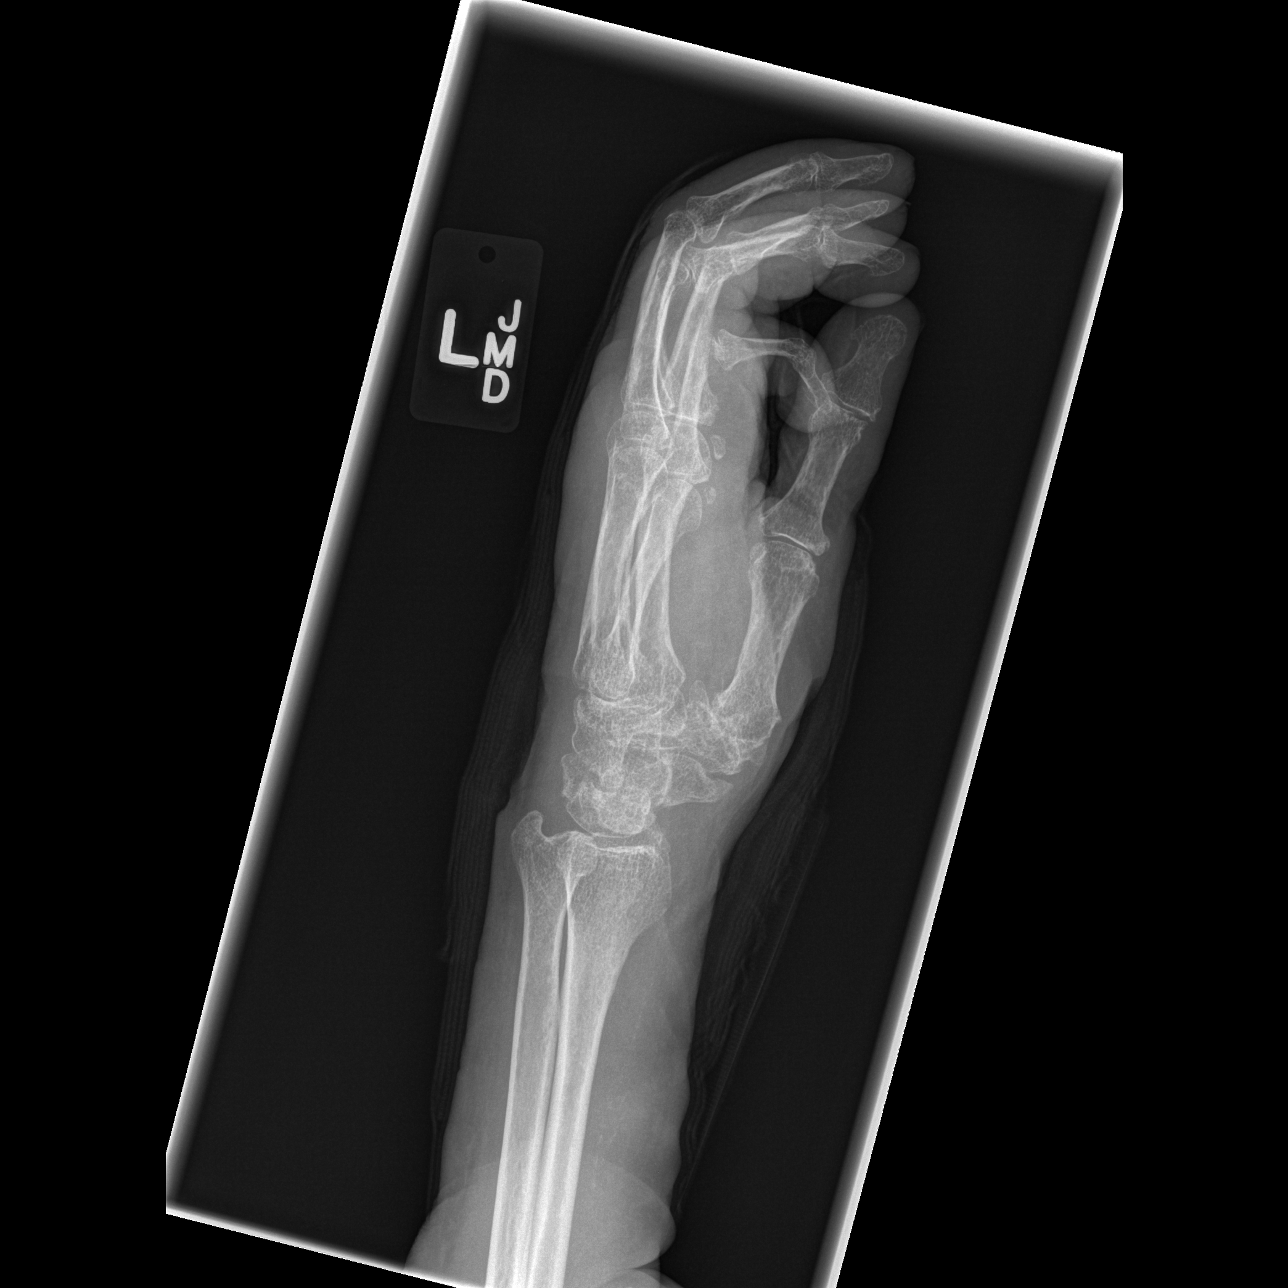

[3 of 3 positions shown; findings below may reference images not displayed]

FINDINGS: No evidence of fracture or dislocation. There is chronic
degenerative arthritis at the first carpal/metacarpal joint with
small peritracheal or calcifications. Ordinary arthritis is present
at the inter phalangeal joints.
IMPRESSION: No acute or traumatic finding. Chronic degenerative arthritis at the
first carpal/metacarpal joint and of the inter phalangeal joints.
